# Patient Record
Sex: Female | Born: 1935 | Race: White | Hispanic: No | Marital: Single | Smoking: Former smoker
Health system: Southern US, Community
[De-identification: ages and names within clinical notes are randomized; demographics above are authoritative.]

## PROBLEM LIST (undated history)

## (undated) DIAGNOSIS — M1990 Unspecified osteoarthritis, unspecified site: Secondary | ICD-10-CM

## (undated) DIAGNOSIS — J449 Chronic obstructive pulmonary disease, unspecified: Secondary | ICD-10-CM

## (undated) DIAGNOSIS — N393 Stress incontinence (female) (male): Secondary | ICD-10-CM

## (undated) DIAGNOSIS — G629 Polyneuropathy, unspecified: Secondary | ICD-10-CM

## (undated) DIAGNOSIS — R51 Headache: Secondary | ICD-10-CM

## (undated) DIAGNOSIS — E119 Type 2 diabetes mellitus without complications: Secondary | ICD-10-CM

## (undated) DIAGNOSIS — M8580 Other specified disorders of bone density and structure, unspecified site: Secondary | ICD-10-CM

## (undated) DIAGNOSIS — E785 Hyperlipidemia, unspecified: Secondary | ICD-10-CM

## (undated) DIAGNOSIS — E669 Obesity, unspecified: Secondary | ICD-10-CM

## (undated) DIAGNOSIS — K219 Gastro-esophageal reflux disease without esophagitis: Secondary | ICD-10-CM

## (undated) DIAGNOSIS — R519 Headache, unspecified: Secondary | ICD-10-CM

## (undated) DIAGNOSIS — I10 Essential (primary) hypertension: Secondary | ICD-10-CM

## (undated) DIAGNOSIS — J45909 Unspecified asthma, uncomplicated: Secondary | ICD-10-CM

## (undated) DIAGNOSIS — H3530 Unspecified macular degeneration: Secondary | ICD-10-CM

## (undated) HISTORY — DX: Chronic obstructive pulmonary disease, unspecified: J44.9

## (undated) HISTORY — PX: BREAST LUMPECTOMY: SHX2

## (undated) HISTORY — DX: Type 2 diabetes mellitus without complications: E11.9

## (undated) HISTORY — DX: Hyperlipidemia, unspecified: E78.5

## (undated) HISTORY — DX: Other specified disorders of bone density and structure, unspecified site: M85.80

## (undated) HISTORY — DX: Unspecified osteoarthritis, unspecified site: M19.90

## (undated) HISTORY — DX: Essential (primary) hypertension: I10

## (undated) HISTORY — DX: Obesity, unspecified: E66.9

---

## 1999-01-16 ENCOUNTER — Other Ambulatory Visit: Admission: RE | Admit: 1999-01-16 | Discharge: 1999-01-16 | Payer: Self-pay | Admitting: Family Medicine

## 2000-04-01 ENCOUNTER — Other Ambulatory Visit: Admission: RE | Admit: 2000-04-01 | Discharge: 2000-04-01 | Payer: Self-pay | Admitting: Family Medicine

## 2001-03-24 ENCOUNTER — Other Ambulatory Visit: Admission: RE | Admit: 2001-03-24 | Discharge: 2001-03-24 | Payer: Self-pay | Admitting: Orthopaedic Surgery

## 2003-04-29 ENCOUNTER — Other Ambulatory Visit: Admission: RE | Admit: 2003-04-29 | Discharge: 2003-04-29 | Payer: Self-pay | Admitting: Family Medicine

## 2003-10-27 HISTORY — PX: COLONOSCOPY: SHX174

## 2008-02-26 HISTORY — PX: OTHER SURGICAL HISTORY: SHX169

## 2009-02-25 HISTORY — PX: COLONOSCOPY: SHX174

## 2011-04-02 DIAGNOSIS — I10 Essential (primary) hypertension: Secondary | ICD-10-CM | POA: Diagnosis not present

## 2011-04-02 DIAGNOSIS — IMO0001 Reserved for inherently not codable concepts without codable children: Secondary | ICD-10-CM | POA: Diagnosis not present

## 2011-04-02 DIAGNOSIS — E785 Hyperlipidemia, unspecified: Secondary | ICD-10-CM | POA: Diagnosis not present

## 2011-04-02 DIAGNOSIS — M545 Low back pain, unspecified: Secondary | ICD-10-CM | POA: Diagnosis not present

## 2011-04-02 DIAGNOSIS — I739 Peripheral vascular disease, unspecified: Secondary | ICD-10-CM | POA: Diagnosis not present

## 2011-04-17 DIAGNOSIS — E559 Vitamin D deficiency, unspecified: Secondary | ICD-10-CM | POA: Diagnosis not present

## 2011-04-17 DIAGNOSIS — I10 Essential (primary) hypertension: Secondary | ICD-10-CM | POA: Diagnosis not present

## 2011-05-21 DIAGNOSIS — D1801 Hemangioma of skin and subcutaneous tissue: Secondary | ICD-10-CM | POA: Diagnosis not present

## 2011-05-21 DIAGNOSIS — L821 Other seborrheic keratosis: Secondary | ICD-10-CM | POA: Diagnosis not present

## 2011-07-09 DIAGNOSIS — E1065 Type 1 diabetes mellitus with hyperglycemia: Secondary | ICD-10-CM | POA: Diagnosis not present

## 2011-07-09 DIAGNOSIS — I10 Essential (primary) hypertension: Secondary | ICD-10-CM | POA: Diagnosis not present

## 2011-07-09 DIAGNOSIS — R7989 Other specified abnormal findings of blood chemistry: Secondary | ICD-10-CM | POA: Diagnosis not present

## 2011-09-26 DIAGNOSIS — I10 Essential (primary) hypertension: Secondary | ICD-10-CM | POA: Diagnosis not present

## 2011-09-26 DIAGNOSIS — E1065 Type 1 diabetes mellitus with hyperglycemia: Secondary | ICD-10-CM | POA: Diagnosis not present

## 2011-10-21 DIAGNOSIS — I10 Essential (primary) hypertension: Secondary | ICD-10-CM | POA: Diagnosis not present

## 2011-10-21 DIAGNOSIS — E785 Hyperlipidemia, unspecified: Secondary | ICD-10-CM | POA: Diagnosis not present

## 2011-10-21 DIAGNOSIS — E119 Type 2 diabetes mellitus without complications: Secondary | ICD-10-CM | POA: Diagnosis not present

## 2011-11-13 DIAGNOSIS — Z23 Encounter for immunization: Secondary | ICD-10-CM | POA: Diagnosis not present

## 2011-11-21 DIAGNOSIS — Z1231 Encounter for screening mammogram for malignant neoplasm of breast: Secondary | ICD-10-CM | POA: Diagnosis not present

## 2011-12-23 DIAGNOSIS — J40 Bronchitis, not specified as acute or chronic: Secondary | ICD-10-CM | POA: Diagnosis not present

## 2011-12-23 DIAGNOSIS — J441 Chronic obstructive pulmonary disease with (acute) exacerbation: Secondary | ICD-10-CM | POA: Diagnosis not present

## 2012-02-11 DIAGNOSIS — E785 Hyperlipidemia, unspecified: Secondary | ICD-10-CM | POA: Diagnosis not present

## 2012-02-11 DIAGNOSIS — E559 Vitamin D deficiency, unspecified: Secondary | ICD-10-CM | POA: Diagnosis not present

## 2012-02-11 DIAGNOSIS — I10 Essential (primary) hypertension: Secondary | ICD-10-CM | POA: Diagnosis not present

## 2012-05-28 ENCOUNTER — Other Ambulatory Visit: Payer: Self-pay | Admitting: *Deleted

## 2012-05-28 MED ORDER — ATORVASTATIN CALCIUM 40 MG PO TABS: ORAL_TABLET | ORAL | Status: DC

## 2012-06-08 ENCOUNTER — Other Ambulatory Visit: Payer: Self-pay | Admitting: *Deleted

## 2012-06-08 MED ORDER — NAPROXEN 500 MG PO TABS: ORAL_TABLET | ORAL | Status: DC

## 2012-07-03 ENCOUNTER — Other Ambulatory Visit: Payer: Self-pay

## 2012-07-03 MED ORDER — LOSARTAN POTASSIUM-HCTZ 100-12.5 MG PO TABS: 1.0000 | ORAL_TABLET | Freq: Every day | ORAL | Status: DC

## 2012-07-10 ENCOUNTER — Other Ambulatory Visit: Payer: Self-pay | Admitting: *Deleted

## 2012-07-10 MED ORDER — IPRATROPIUM-ALBUTEROL 18-103 MCG/ACT IN AERO: 2.0000 | INHALATION_SPRAY | Freq: Four times a day (QID) | RESPIRATORY_TRACT | Status: DC | PRN

## 2012-07-30 DIAGNOSIS — J069 Acute upper respiratory infection, unspecified: Secondary | ICD-10-CM | POA: Diagnosis not present

## 2012-07-30 DIAGNOSIS — H6690 Otitis media, unspecified, unspecified ear: Secondary | ICD-10-CM | POA: Diagnosis not present

## 2012-07-30 DIAGNOSIS — H109 Unspecified conjunctivitis: Secondary | ICD-10-CM | POA: Diagnosis not present

## 2012-09-07 ENCOUNTER — Other Ambulatory Visit: Payer: Self-pay | Admitting: *Deleted

## 2012-09-07 MED ORDER — ATORVASTATIN CALCIUM 40 MG PO TABS: ORAL_TABLET | ORAL | Status: DC

## 2012-09-07 NOTE — Telephone Encounter (Signed)
This is okay to refill one time. Please have patient make an appointment to see a provider and have lab work checked.

## 2012-09-07 NOTE — Telephone Encounter (Signed)
LAST LABS 12/13. NTBS 

## 2012-10-27 ENCOUNTER — Other Ambulatory Visit: Payer: Self-pay

## 2012-10-27 NOTE — Telephone Encounter (Signed)
Last seen 02/11/12  DFS

## 2012-10-28 MED ORDER — SOLIFENACIN SUCCINATE 5 MG PO TABS: 10.0000 mg | ORAL_TABLET | Freq: Every day | ORAL | Status: DC

## 2012-11-08 ENCOUNTER — Other Ambulatory Visit: Payer: Self-pay | Admitting: General Practice

## 2012-11-08 DIAGNOSIS — N3281 Overactive bladder: Secondary | ICD-10-CM

## 2012-11-08 MED ORDER — SOLIFENACIN SUCCINATE 10 MG PO TABS: 10.0000 mg | ORAL_TABLET | Freq: Every day | ORAL | Status: DC

## 2012-11-25 DIAGNOSIS — Z23 Encounter for immunization: Secondary | ICD-10-CM | POA: Diagnosis not present

## 2012-11-27 ENCOUNTER — Ambulatory Visit (INDEPENDENT_AMBULATORY_CARE_PROVIDER_SITE_OTHER): Payer: Medicare Other | Admitting: Family Medicine

## 2012-11-27 ENCOUNTER — Telehealth: Payer: Self-pay | Admitting: Family Medicine

## 2012-11-27 ENCOUNTER — Ambulatory Visit (INDEPENDENT_AMBULATORY_CARE_PROVIDER_SITE_OTHER): Payer: Medicare Other

## 2012-11-27 ENCOUNTER — Encounter: Payer: Self-pay | Admitting: Family Medicine

## 2012-11-27 VITALS — BP 165/75 | HR 77 | Temp 97.9°F | Ht 61.5 in | Wt 209.6 lb

## 2012-11-27 DIAGNOSIS — E1142 Type 2 diabetes mellitus with diabetic polyneuropathy: Secondary | ICD-10-CM | POA: Diagnosis not present

## 2012-11-27 DIAGNOSIS — M8580 Other specified disorders of bone density and structure, unspecified site: Secondary | ICD-10-CM

## 2012-11-27 DIAGNOSIS — I10 Essential (primary) hypertension: Secondary | ICD-10-CM | POA: Insufficient documentation

## 2012-11-27 DIAGNOSIS — M25579 Pain in unspecified ankle and joints of unspecified foot: Secondary | ICD-10-CM | POA: Diagnosis not present

## 2012-11-27 DIAGNOSIS — E1149 Type 2 diabetes mellitus with other diabetic neurological complication: Secondary | ICD-10-CM

## 2012-11-27 DIAGNOSIS — M129 Arthropathy, unspecified: Secondary | ICD-10-CM

## 2012-11-27 DIAGNOSIS — E785 Hyperlipidemia, unspecified: Secondary | ICD-10-CM

## 2012-11-27 DIAGNOSIS — G629 Polyneuropathy, unspecified: Secondary | ICD-10-CM | POA: Insufficient documentation

## 2012-11-27 DIAGNOSIS — J449 Chronic obstructive pulmonary disease, unspecified: Secondary | ICD-10-CM

## 2012-11-27 DIAGNOSIS — M899 Disorder of bone, unspecified: Secondary | ICD-10-CM

## 2012-11-27 DIAGNOSIS — N318 Other neuromuscular dysfunction of bladder: Secondary | ICD-10-CM

## 2012-11-27 DIAGNOSIS — N3281 Overactive bladder: Secondary | ICD-10-CM

## 2012-11-27 DIAGNOSIS — E119 Type 2 diabetes mellitus without complications: Secondary | ICD-10-CM | POA: Insufficient documentation

## 2012-11-27 DIAGNOSIS — E782 Mixed hyperlipidemia: Secondary | ICD-10-CM | POA: Insufficient documentation

## 2012-11-27 DIAGNOSIS — E669 Obesity, unspecified: Secondary | ICD-10-CM | POA: Insufficient documentation

## 2012-11-27 DIAGNOSIS — G609 Hereditary and idiopathic neuropathy, unspecified: Secondary | ICD-10-CM

## 2012-11-27 DIAGNOSIS — M1990 Unspecified osteoarthritis, unspecified site: Secondary | ICD-10-CM | POA: Insufficient documentation

## 2012-11-27 LAB — POCT UA - MICROALBUMIN: Microalbumin Ur, POC: 20 mg/L

## 2012-11-27 LAB — POCT GLYCOSYLATED HEMOGLOBIN (HGB A1C): Hemoglobin A1C: 7.2

## 2012-11-27 MED ORDER — METFORMIN HCL 1000 MG PO TABS: 1000.0000 mg | ORAL_TABLET | Freq: Two times a day (BID) | ORAL | Status: DC

## 2012-11-27 MED ORDER — SOLIFENACIN SUCCINATE 10 MG PO TABS: 10.0000 mg | ORAL_TABLET | Freq: Every day | ORAL | Status: DC

## 2012-11-27 MED ORDER — IPRATROPIUM-ALBUTEROL 18-103 MCG/ACT IN AERO: 2.0000 | INHALATION_SPRAY | Freq: Four times a day (QID) | RESPIRATORY_TRACT | Status: DC | PRN

## 2012-11-27 MED ORDER — SITAGLIPTIN PHOSPHATE 100 MG PO TABS: 100.0000 mg | ORAL_TABLET | Freq: Every day | ORAL | Status: DC

## 2012-11-27 MED ORDER — GABAPENTIN 100 MG PO CAPS: 300.0000 mg | ORAL_CAPSULE | Freq: Three times a day (TID) | ORAL | Status: DC

## 2012-11-27 MED ORDER — LOSARTAN POTASSIUM-HCTZ 100-12.5 MG PO TABS: 1.0000 | ORAL_TABLET | Freq: Every day | ORAL | Status: DC

## 2012-11-27 MED ORDER — ATORVASTATIN CALCIUM 40 MG PO TABS: ORAL_TABLET | ORAL | Status: DC

## 2012-11-27 NOTE — Progress Notes (Signed)
Patient ID: Michaela Moon, female   DOB: March 26, 1935, 77 y.o.   MRN: [id number] SUBJECTIVE: CC: Chief Complaint  Patient presents with  . Follow-up    follow up  ck heel c/o heel spurs   . Medication Refill    needs refills and wants rx for hydrocodone -been using her son's    HPI:  Used to see Paulita Cradle.  Patient is here for follow up of Diabetes Mellitus and foot pain: Symptoms evaluated: Denies Nocturia ,Denies Urinary Frequency , denies Blurred vision ,deniesDizziness,denies.Dysuria,denies paresthesias, denies extremity pain or ulcers.Marland Kitchendenies chest pain. has had an annual eye exam. do check the feet. Does check CBGs. Average CBG:175 Denies episodes of hypoglycemia. Does have an emergency hypoglycemic plan. admits toCompliance with medications. Denies Problems with medications.  Breakfast: eggs , bacon Lunch: snack on crackers  Supper: chicken, meat, mac and cheese.  Past Medical History  Diagnosis Date  . Hypertension   . Hyperlipidemia   . Diabetes mellitus without complication   . Arthritis   . Obesity   . Osteopenia    No past surgical history on file. History   Social History  . Marital Status: Single    Spouse Name: N/A    Number of Children: N/A  . Years of Education: N/A   Occupational History  . Not on file.   Social History Main Topics  . Smoking status: Former Smoker    Types: Cigarettes    Quit date: 02/25/2002  . Smokeless tobacco: Not on file  . Alcohol Use: Not on file  . Drug Use: Not on file  . Sexual Activity: Not on file   Other Topics Concern  . Not on file   Social History Narrative  . No narrative on file   No family history on file. Current Outpatient Prescriptions on File Prior to Visit  Medication Sig Dispense Refill  . albuterol-ipratropium (COMBIVENT) 18-103 MCG/ACT inhaler Inhale 2 puffs into the lungs every 6 (six) hours as needed for wheezing.  14.7 g  0  . atorvastatin (LIPITOR) 40 MG tablet Take 1 &1/2 tabs  qd  135 tablet  0  . losartan-hydrochlorothiazide (HYZAAR) 100-12.5 MG per tablet Take 1 tablet by mouth daily.  30 tablet  0  . naproxen (NAPROSYN) 500 MG tablet Take 1 tab po bid prn pain, take with meal.Must be seen before next refill  60 tablet  0  . solifenacin (VESICARE) 10 MG tablet Take 1 tablet (10 mg total) by mouth daily.  30 tablet  1   No current facility-administered medications on file prior to visit.   No Known Allergies Immunization History  Administered Date(s) Administered  . Influenza-Generic 10/26/2012  . Pneumococcal Conjugate 02/26/2004  . Zoster 03/29/2007   Prior to Admission medications   Medication Sig Start Date End Date Taking? Authorizing Provider  albuterol-ipratropium (COMBIVENT) 18-103 MCG/ACT inhaler Inhale 2 puffs into the lungs every 6 (six) hours as needed for wheezing. 07/10/12  Yes Ernestina Penna, MD  atorvastatin (LIPITOR) 40 MG tablet Take 1 &1/2 tabs qd 09/07/12  Yes Ernestina Penna, MD  losartan-hydrochlorothiazide [facility]) 100-12.5 MG per tablet Take 1 tablet by mouth daily. 07/03/12  Yes Ernestina Penna, MD  metFORMIN (GLUCOPHAGE) 1000 MG tablet Take 1,000 mg by mouth 2 (two) times daily with a meal.   Yes Historical Provider, MD  naproxen (NAPROSYN) 500 MG tablet Take 1 tab po bid prn pain, take with meal.Must be seen before next refill 06/08/12  Yes Ernestina Penna,  MD  sitaGLIPtin (JANUVIA) 100 MG tablet Take 100 mg by mouth daily.   Yes Historical Provider, MD  solifenacin (VESICARE) 10 MG tablet Take 1 tablet (10 mg total) by mouth daily. 11/08/12  Yes Mae Shelda Altes, FNP      ROS: As above in the HPI. All other systems are stable or negative.  OBJECTIVE: APPEARANCE:  Patient in no acute distress.The patient appeared well nourished and normally developed. Acyanotic. Waist: VITAL SIGNS:BP 165/75  Pulse 77  Temp(Src) 97.9 F (36.6 C) (Oral)  Ht 5' 1.5" (1.562 m)  Wt 209 lb 9.6 oz (95.074 kg)  BMI 38.97 kg/m2 WF Obese  elderly Accompanied by son with the expectation and demand for opiods for pain  SKIN: warm and  Dry without overt rashes, tattoos and scars  HEAD and Neck: without JVD, Head and scalp: normal Eyes:No scleral icterus. Fundi normal, eye movements normal. Ears: Auricle normal, canal normal, Tympanic membranes normal, insufflation normal. Nose: normal Throat: normal Neck & thyroid: normal  CHEST & LUNGS: Chest wall: normal Lungs: Clear  CVS: Reveals the PMI to be normally located. Regular rhythm, First and Second Heart sounds are normal,  absence of murmurs, rubs or gallops. Peripheral vasculature: Radial pulses: normal Dorsal pedis pulses: normal Posterior pulses: normal  ABDOMEN:  Appearance: obese Benign, no organomegaly, no masses, no Abdominal Aortic enlargement. No Guarding , no rebound. No Bruits. Bowel sounds: normal  RECTAL: N/A GU: N/A  EXTREMETIES: nonedematous.  MUSCULOSKELETAL:  Spine: normal Joints: knees mild  Crepitus and discomfort to ROM.  NEUROLOGIC: oriented to time,place and person; nonfocal. Strength is normal Sensory is normal to monofilament Reflexes are normal Cranial Nerves are normal.  C/o of burning pain of the feet.  ASSESSMENT:  PLAN: Orders Placed This Encounter  Procedures  . DG Foot Complete Left    Standing Status: Future     Number of Occurrences: 1     Standing Expiration Date: 01/27/2014    Order Specific Question:  Reason for Exam (SYMPTOM  OR DIAGNOSIS REQUIRED)    Answer:  left foot pain, DM    Order Specific Question:  Preferred imaging location?    Answer:  Internal  . CMP14+EGFR  . NMR, lipoprofile  . Arthritis Panel  . Microalbumin, urine  . POCT glycosylated hemoglobin (Hb A1C)  . POCT UA - Microalbumin    [facility] reading (PRIMARY) by  Dr. Modesto Moon: mild osteopenia of the bones of the feet, no acute findings. Small heel spurs  Discussed with patient that I suspect that her problem is all related to DM neuropathy  especially with her noncompliance and poor control.  Medications Discontinued During This Encounter  Medication Reason  . losartan-hydrochlorothiazide (HYZAAR) 100-12.5 MG per tablet Reorder  . metFORMIN (GLUCOPHAGE) 1000 MG tablet Reorder  . sitaGLIPtin (JANUVIA) 100 MG tablet Reorder  . atorvastatin (LIPITOR) 40 MG tablet Reorder  . solifenacin (VESICARE) 10 MG tablet Reorder  . albuterol-ipratropium (COMBIVENT) 18-103 MCG/ACT inhaler Reorder   Meds ordered this encounter  Medications  . DISCONTD: sitaGLIPtin (JANUVIA) 100 MG tablet    Sig: Take 100 mg by mouth daily.  Marland Kitchen DISCONTD: metFORMIN (GLUCOPHAGE) 1000 MG tablet    Sig: Take 1,000 mg by mouth 2 (two) times daily with a meal.  . losartan-hydrochlorothiazide (HYZAAR) 100-12.5 MG per tablet    Sig: Take 1 tablet by mouth daily.    Dispense:  30 tablet    Refill:  3  . metFORMIN (GLUCOPHAGE) 1000 MG tablet    Sig: Take  1 tablet (1,000 mg total) by mouth 2 (two) times daily with a meal.    Dispense:  60 tablet    Refill:  3  . sitaGLIPtin (JANUVIA) 100 MG tablet    Sig: Take 1 tablet (100 mg total) by mouth daily.    Dispense:  30 tablet    Refill:  3  . atorvastatin (LIPITOR) 40 MG tablet    Sig: Take 1 &1/2 tabs qd    Dispense:  135 tablet    Refill:  3    NTBS  . solifenacin (VESICARE) 10 MG tablet    Sig: Take 1 tablet (10 mg total) by mouth daily.    Dispense:  30 tablet    Refill:  2    Order Specific Question:  Supervising Provider    Answer:  Ernestina Penna [[id number]]  . albuterol-ipratropium (COMBIVENT) 18-103 MCG/ACT inhaler    Sig: Inhale 2 puffs into the lungs every 6 (six) hours as needed for wheezing.    Dispense:  14.7 g    Refill:  2    Must be seen before next refill  . gabapentin (NEURONTIN) 100 MG capsule    Sig: Take 3 capsules (300 mg total) by mouth 3 (three) times daily. Start with 1 capsule daily for 2 days then twice  A day for 2 days then 3 times a day after that    Dispense:  90 capsule     Refill:  3   Informed need for compliance , diet and keeping active.  Return in about 2 months (around 01/27/2013) for Recheck medical problems.  Michaela Moon, M.D.

## 2012-11-27 NOTE — Telephone Encounter (Signed)
Dr. Modesto Charon aware. Patient discussed this with him at office visit today. Patient is aware that pain clinic appointments take a couple of weeks to schedule.  She is ok with this. Notified that prescription for neurontin is available for pickup at the front desk.

## 2012-11-28 LAB — MICROALBUMIN, URINE: Microalbumin, Urine: 6.8 ug/mL (ref 0.0–17.0)

## 2012-11-30 ENCOUNTER — Other Ambulatory Visit: Payer: Self-pay | Admitting: Family Medicine

## 2012-11-30 LAB — NMR, LIPOPROFILE
Cholesterol: 160 mg/dL (ref <200)
HDL Cholesterol by NMR: 36 mg/dL — ABNORMAL LOW (ref >=40)
HDL Particle Number: 31.4 umol/L (ref >=30.5)
LDL Particle Number: 1704 nmol/L — ABNORMAL HIGH (ref <1000)
LDL Size: 19.9 nm — ABNORMAL LOW (ref >20.5)
LDLC SERPL CALC-MCNC: 80 mg/dL (ref <100)
LP-IR Score: 70 — ABNORMAL HIGH (ref <=45)
Small LDL Particle Number: 1236 nmol/L — ABNORMAL HIGH (ref <=527)
Triglycerides by NMR: 218 mg/dL — ABNORMAL HIGH (ref <150)

## 2012-11-30 LAB — CMP14+EGFR
ALT: 40 [IU]/L — ABNORMAL HIGH (ref 0–32)
AST: 32 [IU]/L (ref 0–40)
Albumin/Globulin Ratio: 1.5 (ref 1.1–2.5)
Albumin: 4.9 g/dL — ABNORMAL HIGH (ref 3.5–4.8)
Alkaline Phosphatase: 56 [IU]/L (ref 39–117)
BUN/Creatinine Ratio: 17 (ref 11–26)
BUN: 13 mg/dL (ref 8–27)
CO2: 26 mmol/L (ref 18–29)
Calcium: 11.3 mg/dL — ABNORMAL HIGH (ref 8.6–10.2)
Chloride: 98 mmol/L (ref 97–108)
Creatinine, Ser: 0.75 mg/dL (ref 0.57–1.00)
GFR calc Af Amer: 90 mL/min/{1.73_m2}
GFR calc non Af Amer: 78 mL/min/{1.73_m2}
Globulin, Total: 3.3 g/dL (ref 1.5–4.5)
Glucose: 142 mg/dL — ABNORMAL HIGH (ref 65–99)
Potassium: 4 mmol/L (ref 3.5–5.2)
Sodium: 139 mmol/L (ref 134–144)
Total Bilirubin: 0.4 mg/dL (ref 0.0–1.2)
Total Protein: 8.2 g/dL (ref 6.0–8.5)

## 2012-11-30 LAB — ARTHRITIS PANEL
Anti Nuclear Antibody(ANA): NEGATIVE
Rheumatoid fact SerPl-aCnc: <7 [IU]/mL (ref 0.0–13.9)
Sed Rate: 12 mm/h (ref 0–40)
Uric Acid: 5.3 mg/dL (ref 2.5–7.1)

## 2012-11-30 NOTE — Progress Notes (Signed)
Quick Note:  Labs abnormal. Needs additional labs. The calcium is too high.ordered in EPIC The DM and the lipids are not at goal. Needs to see Tammy or Marcelino Duster for diet review and medication adjustment. ______

## 2012-12-02 ENCOUNTER — Other Ambulatory Visit (INDEPENDENT_AMBULATORY_CARE_PROVIDER_SITE_OTHER): Payer: Medicare Other

## 2012-12-08 LAB — PTH, INTACT AND CALCIUM
Calcium: 10.8 mg/dL — ABNORMAL HIGH (ref 8.6–10.2)
PTH: 28 pg/mL (ref 15–65)

## 2012-12-08 LAB — CALCIUM, IONIZED: Calcium, Ion: 5.5 mg/dL (ref 4.5–5.6)

## 2012-12-09 NOTE — Progress Notes (Signed)
Quick Note:  Call patient. Labs for PTH and ionized calcium are normal. The total calcium is a little high and this could be either due to the tourniquet being on her arm too long during the lab draw or if she is taking calcium she will need to stop and we will recheck at the next vivit. No change in plan. ______

## 2012-12-23 DIAGNOSIS — Z1231 Encounter for screening mammogram for malignant neoplasm of breast: Secondary | ICD-10-CM | POA: Diagnosis not present

## 2012-12-24 ENCOUNTER — Encounter: Payer: Self-pay | Admitting: Pharmacist

## 2012-12-24 ENCOUNTER — Ambulatory Visit (INDEPENDENT_AMBULATORY_CARE_PROVIDER_SITE_OTHER): Payer: Medicare Other | Admitting: Pharmacist

## 2012-12-24 VITALS — BP 144/72 | HR 76 | Ht 61.5 in | Wt 208.0 lb

## 2012-12-24 MED ORDER — ATORVASTATIN CALCIUM 80 MG PO TABS: ORAL_TABLET | ORAL | Status: DC

## 2012-12-24 NOTE — Addendum Note (Signed)
Addended by: Henrene Pastor on: 12/24/2012 04:49 PM   Modules accepted: Orders, Medications

## 2012-12-24 NOTE — Progress Notes (Signed)
CC:  Hypercalcemia and medication review.   HPI:  Patient had elevated calcium of 11.3 on 11/27/2012.  Was rechecked on 12/02/2012 and calcium was 10.8.  Ionized calcium and PTH were normal on 12/02/2012. Current meds in chart are listed below but she also states that she takes a MVI and ASA 81mg  daily.  Medication record was updated.  She also was taking a daily vitamin D supplement but she stopped when discovered calcium was elevated.    Patient also brings in letter from her insurance company which states that starting February 25, 2013 there will be a limit of 90 tablets per 90 days of atorvastatin.  She is currently taking atorvastatin 40mg  1 and 1/2 tablets daily.   Current Outpatient Prescriptions on File Prior to Visit  Medication Sig Dispense Refill  . albuterol-ipratropium (COMBIVENT) 18-103 MCG/ACT inhaler Inhale 2 puffs into the lungs every 6 (six) hours as needed for wheezing.  14.7 g  2  . gabapentin (NEURONTIN) 100 MG capsule Take 3 capsules (300 mg total) by mouth 3 (three) times daily. Start with 1 capsule daily for 2 days then twice  A day for 2 days then 3 times a day after that  90 capsule  3  . losartan-hydrochlorothiazide (HYZAAR) 100-12.5 MG per tablet Take 1 tablet by mouth daily.  30 tablet  3  . metFORMIN (GLUCOPHAGE) 1000 MG tablet Take 1 tablet (1,000 mg total) by mouth 2 (two) times daily with a meal.  60 tablet  3  . naproxen (NAPROSYN) 500 MG tablet Take 1 tab po bid prn pain, take with meal.Must be seen before next refill  60 tablet  0  . sitaGLIPtin (JANUVIA) 100 MG tablet Take 1 tablet (100 mg total) by mouth daily.  30 tablet  3  . solifenacin (VESICARE) 10 MG tablet Take 1 tablet (10 mg total) by mouth daily.  30 tablet  2   No current facility-administered medications on file prior to visit.    Assessment: Hypercalcemia - possible medication related Medication Management  Plan: Discontinue multivitamin Continue to hold vitamin D Rx for atorvastatin  changed to 80mg  - take 1 tablet every other day alternating with 1/2 tablet every other day.  (patient will still get average of 60mg  daily) Recheck BMET today -  Pending  Time spent with patient = 15 minutes Henrene Pastor, PharmD, CPP

## 2012-12-25 LAB — BMP8+EGFR
BUN: 11 mg/dL (ref 8–27)
CO2: 25 mmol/L (ref 18–29)
Calcium: 10.6 mg/dL — ABNORMAL HIGH (ref 8.6–10.2)
Chloride: 99 mmol/L (ref 97–108)
Creatinine, Ser: 0.72 mg/dL (ref 0.57–1.00)
Potassium: 4.6 mmol/L (ref 3.5–5.2)
Sodium: 140 mmol/L (ref 134–144)

## 2012-12-28 ENCOUNTER — Telehealth: Payer: Self-pay | Admitting: Pharmacist

## 2012-12-28 NOTE — Telephone Encounter (Signed)
Calcium level continues to decrease.  At our visit last week I discontinued patient's multivitamin.  Should continue to see calcium decrease since this change.  Recheck BMET / Calcium in 1 month.

## 2013-01-19 DIAGNOSIS — J111 Influenza due to unidentified influenza virus with other respiratory manifestations: Secondary | ICD-10-CM | POA: Diagnosis not present

## 2013-01-19 DIAGNOSIS — R05 Cough: Secondary | ICD-10-CM | POA: Diagnosis not present

## 2013-01-19 DIAGNOSIS — J189 Pneumonia, unspecified organism: Secondary | ICD-10-CM | POA: Diagnosis not present

## 2013-01-19 DIAGNOSIS — J449 Chronic obstructive pulmonary disease, unspecified: Secondary | ICD-10-CM | POA: Diagnosis not present

## 2013-01-29 ENCOUNTER — Encounter: Payer: Self-pay | Admitting: Family Medicine

## 2013-01-29 ENCOUNTER — Ambulatory Visit (INDEPENDENT_AMBULATORY_CARE_PROVIDER_SITE_OTHER): Payer: Medicare Other | Admitting: Family Medicine

## 2013-01-29 ENCOUNTER — Ambulatory Visit (INDEPENDENT_AMBULATORY_CARE_PROVIDER_SITE_OTHER): Payer: Medicare Other

## 2013-01-29 VITALS — BP 142/66 | HR 106 | Temp 97.9°F | Ht 62.0 in | Wt 205.2 lb

## 2013-01-29 DIAGNOSIS — M1990 Unspecified osteoarthritis, unspecified site: Secondary | ICD-10-CM

## 2013-01-29 DIAGNOSIS — E669 Obesity, unspecified: Secondary | ICD-10-CM

## 2013-01-29 DIAGNOSIS — R635 Abnormal weight gain: Secondary | ICD-10-CM

## 2013-01-29 DIAGNOSIS — G629 Polyneuropathy, unspecified: Secondary | ICD-10-CM

## 2013-01-29 DIAGNOSIS — M899 Disorder of bone, unspecified: Secondary | ICD-10-CM

## 2013-01-29 DIAGNOSIS — G609 Hereditary and idiopathic neuropathy, unspecified: Secondary | ICD-10-CM | POA: Diagnosis not present

## 2013-01-29 DIAGNOSIS — J189 Pneumonia, unspecified organism: Secondary | ICD-10-CM

## 2013-01-29 DIAGNOSIS — I10 Essential (primary) hypertension: Secondary | ICD-10-CM

## 2013-01-29 DIAGNOSIS — M129 Arthropathy, unspecified: Secondary | ICD-10-CM

## 2013-01-29 DIAGNOSIS — E785 Hyperlipidemia, unspecified: Secondary | ICD-10-CM

## 2013-01-29 DIAGNOSIS — E119 Type 2 diabetes mellitus without complications: Secondary | ICD-10-CM

## 2013-01-29 DIAGNOSIS — M8580 Other specified disorders of bone density and structure, unspecified site: Secondary | ICD-10-CM

## 2013-01-29 NOTE — Patient Instructions (Signed)
    Dr Gared Gillie's Recommendations  For nutrition information, I recommend books:  1).Eat to Live by Dr Joel Fuhrman. 2).Prevent and Reverse Heart Disease by Dr Caldwell Esselstyn. 3) Dr Neal Barnard's Book:  Program to Reverse Diabetes  Exercise recommendations are:  If unable to walk, then the patient can exercise in a chair 3 times a day. By flapping arms like a bird gently and raising legs outwards to the front.  If ambulatory, the patient can go for walks for 30 minutes 3 times a week. Then increase the intensity and duration as tolerated.  Goal is to try to attain exercise frequency to 5 times a week.  If applicable: Best to perform resistance exercises (machines or weights) 2 days a week and cardio type exercises 3 days per week.   Diabetes and Foot Care Diabetes may cause you to have problems because of poor blood supply (circulation) to your feet and legs. This may cause the skin on your feet to become thinner, break easier, and heal more slowly. Your skin may become dry, and the skin may peel and crack. You may also have nerve damage in your legs and feet causing decreased feeling in them. You may not notice minor injuries to your feet that could lead to infections or more serious problems. Taking care of your feet is one of the most important things you can do for yourself.  HOME CARE INSTRUCTIONS  Wear shoes at all times, even in the house. Do not go barefoot. Bare feet are easily injured.  Check your feet daily for blisters, cuts, and redness. If you cannot see the bottom of your feet, use a mirror or ask someone for help.  Wash your feet with warm water (do not use hot water) and mild soap. Then pat your feet and the areas between your toes until they are completely dry. Do not soak your feet as this can dry your skin.  Apply a moisturizing lotion or petroleum jelly (that does not contain alcohol and is unscented) to the skin on your feet and to dry, brittle toenails.  Do not apply lotion between your toes.  Trim your toenails straight across. Do not dig under them or around the cuticle. File the edges of your nails with an emery board or nail file.  Do not cut corns or calluses or try to remove them with medicine.  Wear clean socks or stockings every day. Make sure they are not too tight. Do not wear knee-high stockings since they may decrease blood flow to your legs.  Wear shoes that fit properly and have enough cushioning. To break in new shoes, wear them for just a few hours a day. This prevents you from injuring your feet. Always look in your shoes before you put them on to be sure there are no objects inside.  Do not cross your legs. This may decrease the blood flow to your feet.  If you find a minor scrape, cut, or break in the skin on your feet, keep it and the skin around it clean and dry. These areas may be cleansed with mild soap and water. Do not cleanse the area with peroxide, alcohol, or iodine.  When you remove an adhesive bandage, be sure not to damage the skin around it.  If you have a wound, look at it several times a day to make sure it is healing.  Do not use heating pads or hot water bottles. They may burn your skin. If   you have lost feeling in your feet or legs, you may not know it is happening until it is too late.  Make sure your health care provider performs a complete foot exam at least annually or more often if you have foot problems. Report any cuts, sores, or bruises to your health care provider immediately. SEEK MEDICAL CARE IF:   You have an injury that is not healing.  You have cuts or breaks in the skin.  You have an ingrown nail.  You notice redness on your legs or feet.  You feel burning or tingling in your legs or feet.  You have pain or cramps in your legs and feet.  Your legs or feet are numb.  Your feet always feel cold. SEEK IMMEDIATE MEDICAL CARE IF:   There is increasing redness, swelling, or pain in  or around a wound.  There is a red line that goes up your leg.  Pus is coming from a wound.  You develop a fever or as directed by your health care provider.  You notice a bad smell coming from an ulcer or wound. Document Released: 02/09/2000 Document Revised: 10/14/2012 Document Reviewed: 07/21/2012 [employer] Patient Information 2014 [employer].  

## 2013-01-29 NOTE — Progress Notes (Signed)
Patient ID: Michaela Moon, female   DOB: 12-28-1935, 77 y.o.   MRN: [id number] SUBJECTIVE: CC: Chief Complaint  Patient presents with  . Follow-up    1 month follow up states had pneumoinia on 01-19-13 treated at the urgent care    HPI: Recent pneumonia before thanksgiving. Feels pretty good now.  Patient is here for follow up of Diabetes Mellitus/obesity/HTN/HLD Symptoms evaluated: Denies Nocturia ,Denies Urinary Frequency , denies Blurred vision ,deniesDizziness,denies.Dysuria,denies paresthesias, denies extremity  ulcers.Marland Kitchendenies chest pain. has had an annual eye exam. do check the feet.feet burns but the gabapentin has relieved this Does check CBGs. Average CBG:130s Denies episodes of hypoglycemia. Does have an emergency hypoglycemic plan. admits toCompliance with medications. Denies Problems with medications.   Past Medical History  Diagnosis Date  . Hypertension   . Hyperlipidemia   . Diabetes mellitus without complication   . Arthritis   . Obesity   . Osteopenia    No past surgical history on file. History   Social History  . Marital Status: Single    Spouse Name: N/A    Number of Children: N/A  . Years of Education: N/A   Occupational History  . Not on file.   Social History Main Topics  . Smoking status: Former Smoker    Types: Cigarettes    Quit date: 02/25/2002  . Smokeless tobacco: Not on file  . Alcohol Use: Not on file  . Drug Use: Not on file  . Sexual Activity: Not on file   Other Topics Concern  . Not on file   Social History Narrative  . No narrative on file   No family history on file. Current Outpatient Prescriptions on File Prior to Visit  Medication Sig Dispense Refill  . albuterol-ipratropium (COMBIVENT) 18-103 MCG/ACT inhaler Inhale 2 puffs into the lungs every 6 (six) hours as needed for wheezing.  14.7 g  2  . atorvastatin (LIPITOR) 80 MG tablet Alternate taking 1 tablet every other day and 1/2 tablet every other day (to average  60mg  daily)  68 tablet  1  . gabapentin (NEURONTIN) 100 MG capsule Take 3 capsules (300 mg total) by mouth 3 (three) times daily. Start with 1 capsule daily for 2 days then twice  A day for 2 days then 3 times a day after that  90 capsule  3  . losartan-hydrochlorothiazide (HYZAAR) 100-12.5 MG per tablet Take 1 tablet by mouth daily.  30 tablet  3  . metFORMIN (GLUCOPHAGE) 1000 MG tablet Take 1 tablet (1,000 mg total) by mouth 2 (two) times daily with a meal.  60 tablet  3  . naproxen (NAPROSYN) 500 MG tablet Take 1 tab po bid prn pain, take with meal.Must be seen before next refill  60 tablet  0  . sitaGLIPtin (JANUVIA) 100 MG tablet Take 1 tablet (100 mg total) by mouth daily.  30 tablet  3  . solifenacin (VESICARE) 10 MG tablet Take 1 tablet (10 mg total) by mouth daily.  30 tablet  2  . aspirin 81 MG tablet Take 81 mg by mouth daily.       No current facility-administered medications on file prior to visit.   No Known Allergies Immunization History  Administered Date(s) Administered  . Influenza-Unspecified 10/26/2012  . Pneumococcal Conjugate-13 02/26/2004  . Zoster 03/29/2007   Prior to Admission medications   Medication Sig Start Date End Date Taking? Authorizing Provider  albuterol-ipratropium (COMBIVENT) 18-103 MCG/ACT inhaler Inhale 2 puffs into the lungs every 6 (six) hours  as needed for wheezing. 11/27/12   Ileana Ladd, MD  aspirin 81 MG tablet Take 81 mg by mouth daily.    Historical Provider, MD  atorvastatin (LIPITOR) 80 MG tablet Alternate taking 1 tablet every other day and 1/2 tablet every other day (to average 60mg  daily) 12/24/12   Tammy Eckard, PHARMD  gabapentin (NEURONTIN) 100 MG capsule Take 3 capsules (300 mg total) by mouth 3 (three) times daily. Start with 1 capsule daily for 2 days then twice  A day for 2 days then 3 times a day after that 11/27/12   Ileana Ladd, MD  losartan-hydrochlorothiazide (HYZAAR) 100-12.5 MG per tablet Take 1 tablet by mouth daily.  11/27/12   Ileana Ladd, MD  metFORMIN (GLUCOPHAGE) 1000 MG tablet Take 1 tablet (1,000 mg total) by mouth 2 (two) times daily with a meal. 11/27/12   Ileana Ladd, MD  naproxen (NAPROSYN) 500 MG tablet Take 1 tab po bid prn pain, take with meal.Must be seen before next refill 06/08/12   Ernestina Penna, MD  sitaGLIPtin (JANUVIA) 100 MG tablet Take 1 tablet (100 mg total) by mouth daily. 11/27/12   Ileana Ladd, MD  solifenacin (VESICARE) 10 MG tablet Take 1 tablet (10 mg total) by mouth daily. 11/27/12   Ileana Ladd, MD     ROS: As above in the HPI. All other systems are stable or negative.  OBJECTIVE: APPEARANCE:  Patient in no acute distress.The patient appeared well nourished and normally developed. Acyanotic. Waist: VITAL SIGNS:BP 142/66  Pulse 106  Temp(Src) 97.9 F (36.6 C) (Oral)  Ht 5\' 2"  (1.575 m)  Wt 205 lb 3.2 oz (93.078 kg)  BMI 37.52 kg/m2 Obese WF congested  SKIN: warm and  Dry without overt rashes, tattoos and scars  HEAD and Neck: without JVD, Head and scalp: normal Eyes:No scleral icterus. Fundi normal, eye movements normal. Ears: Auricle normal, canal normal, Tympanic membranes normal, insufflation normal. Nose: normal Throat: normal Neck & thyroid: normal  CHEST & LUNGS: Chest wall: normal Lungs: Coarse bs. No rales  CVS: Reveals the PMI to be normally located. Regular rhythm, First and Second Heart sounds are normal,  absence of murmurs, rubs or gallops. Peripheral vasculature: Radial pulses: normal Dorsal pedis pulses: normal Posterior pulses: normal  ABDOMEN:  Appearance: Obese Benign, no organomegaly, no masses, no Abdominal Aortic enlargement. No Guarding , no rebound. No Bruits. Bowel sounds: normal  RECTAL: N/A GU: N/A  EXTREMETIES: nonedematous.  MUSCULOSKELETAL:  Spine: normal Joints: crepitus of knees.  NEUROLOGIC: oriented to time,place and person; nonfocal. Strength is normal Sensorymild subjective burning of the  knees. improved from the last visit. Reflexes are normal Cranial Nerves are normal.   Results for orders placed in visit on 12/24/12  [facility]      Result Value Range   Glucose 124 (*) 65 - 99 mg/dL   BUN 11  8 - 27 mg/dL   Creatinine, Ser 8.29  0.57 - 1.00 mg/dL   GFR calc non Af Amer 82  >59 mL/min/1.73   GFR calc Af Amer 95  >59 mL/min/1.73   BUN/Creatinine Ratio 15  11 - 26   Sodium 140  134 - 144 mmol/L   Potassium 4.6  3.5 - 5.2 mmol/L   Chloride 99  97 - 108 mmol/L   CO2 25  18 - 29 mmol/L   Calcium 10.6 (*) 8.6 - 10.2 mg/dL    ASSESSMENT:  Pneumonia - Plan: DG Chest 2 View  Peripheral  neuropathy  Osteopenia  Obesity  Hypertension  Diabetes mellitus without complication  Hyperlipidemia  Arthritis  Recent pneumonia treated at the Urgent [facility]  PLAN:      Dr Woodroe Mode Recommendations  For nutrition information, I recommend books:  1).Eat to Live by Dr Monico Hoar. 2).Prevent and Reverse Heart Disease by Dr Suzzette Righter. 3) Dr Katherina Right Book:  Program to Reverse Diabetes  Exercise recommendations are:  If unable to walk, then the patient can exercise in a chair 3 times a day. By flapping arms like a bird gently and raising legs outwards to the front.  If ambulatory, the patient can go for walks for 30 minutes 3 times a week. Then increase the intensity and duration as tolerated.  Goal is to try to attain exercise frequency to 5 times a week.  If applicable: Best to perform resistance exercises (machines or weights) 2 days a week and cardio type exercises 3 days per week.  DM foot care in the AVS.  Orders Placed This Encounter  Procedures  . DG Chest 2 View    Standing Status: Future     Number of Occurrences: 1     Standing Expiration Date: 04/01/2014    Order Specific Question:  Reason for Exam (SYMPTOM  OR DIAGNOSIS REQUIRED)    Answer:  recent pneumonia    Order Specific Question:  Preferred imaging  location?    Answer:  Internal   same  medications No orders of the defined types were placed in this encounter.   There are no discontinued medications. Return in about 2 months (around 04/01/2013) for Recheck medical problems. [facility] reading (PRIMARY) by  Dr. Modesto Charon: bibasilar atelectasis and scarring. Await official overead.                          Tharon Kitch P. Modesto Charon, M.D.

## 2013-02-23 ENCOUNTER — Other Ambulatory Visit: Payer: Self-pay | Admitting: *Deleted

## 2013-02-23 DIAGNOSIS — N3281 Overactive bladder: Secondary | ICD-10-CM

## 2013-02-23 MED ORDER — SOLIFENACIN SUCCINATE 10 MG PO TABS: 10.0000 mg | ORAL_TABLET | Freq: Every day | ORAL | Status: DC

## 2013-02-23 MED ORDER — NAPROXEN 500 MG PO TABS: ORAL_TABLET | ORAL | Status: DC

## 2013-04-01 ENCOUNTER — Encounter: Payer: Self-pay | Admitting: Family Medicine

## 2013-04-01 ENCOUNTER — Ambulatory Visit (INDEPENDENT_AMBULATORY_CARE_PROVIDER_SITE_OTHER): Payer: Medicare Other | Admitting: Family Medicine

## 2013-04-01 ENCOUNTER — Encounter (INDEPENDENT_AMBULATORY_CARE_PROVIDER_SITE_OTHER): Payer: Self-pay

## 2013-04-01 VITALS — BP 149/74 | HR 106 | Temp 98.1°F | Ht 62.0 in | Wt 201.4 lb

## 2013-04-01 DIAGNOSIS — M949 Disorder of cartilage, unspecified: Secondary | ICD-10-CM

## 2013-04-01 DIAGNOSIS — E119 Type 2 diabetes mellitus without complications: Secondary | ICD-10-CM | POA: Diagnosis not present

## 2013-04-01 DIAGNOSIS — J449 Chronic obstructive pulmonary disease, unspecified: Secondary | ICD-10-CM

## 2013-04-01 DIAGNOSIS — I10 Essential (primary) hypertension: Secondary | ICD-10-CM | POA: Diagnosis not present

## 2013-04-01 DIAGNOSIS — E785 Hyperlipidemia, unspecified: Secondary | ICD-10-CM

## 2013-04-01 DIAGNOSIS — J441 Chronic obstructive pulmonary disease with (acute) exacerbation: Secondary | ICD-10-CM | POA: Insufficient documentation

## 2013-04-01 DIAGNOSIS — M8580 Other specified disorders of bone density and structure, unspecified site: Secondary | ICD-10-CM

## 2013-04-01 DIAGNOSIS — M899 Disorder of bone, unspecified: Secondary | ICD-10-CM | POA: Diagnosis not present

## 2013-04-01 DIAGNOSIS — M129 Arthropathy, unspecified: Secondary | ICD-10-CM

## 2013-04-01 DIAGNOSIS — G609 Hereditary and idiopathic neuropathy, unspecified: Secondary | ICD-10-CM | POA: Diagnosis not present

## 2013-04-01 DIAGNOSIS — M1990 Unspecified osteoarthritis, unspecified site: Secondary | ICD-10-CM

## 2013-04-01 DIAGNOSIS — E669 Obesity, unspecified: Secondary | ICD-10-CM

## 2013-04-01 DIAGNOSIS — G629 Polyneuropathy, unspecified: Secondary | ICD-10-CM

## 2013-04-01 LAB — POCT GLYCOSYLATED HEMOGLOBIN (HGB A1C): Hemoglobin A1C: 6.7

## 2013-04-01 MED ORDER — LOSARTAN POTASSIUM-HCTZ 100-25 MG PO TABS: 1.0000 | ORAL_TABLET | Freq: Every day | ORAL | Status: DC

## 2013-04-01 NOTE — Progress Notes (Signed)
Patient ID: Michaela Moon, female   DOB: 1935-12-04, 78 y.o.   MRN: [id number] SUBJECTIVE: CC: Chief Complaint  Patient presents with  . Follow-up    2 month follo up     HPI:  Patient is here for follow up of Diabetes Mellitus/HTN/HLD/COPD/Obesity: Symptoms evaluated: Denies Nocturia ,Denies Urinary Frequency , denies Blurred vision ,deniesDizziness,denies.Dysuria,denies paresthesias, denies extremity pain or ulcers.Marland Kitchendenies chest pain. has had an annual eye exam. do check the feet. Does check CBGs. Average CBG:150 to 160 Denies episodes of hypoglycemia. Does have an emergency hypoglycemic plan. admits toCompliance with medications. Denies Problems with medications.   Had the flu 3 weeks ago and sweated it out in the bed at home.  Better now. Doing good now.   Past Medical History  Diagnosis Date  . Hypertension   . Hyperlipidemia   . Diabetes mellitus without complication   . Arthritis   . Obesity   . Osteopenia   . COPD (chronic obstructive pulmonary disease)    No past surgical history on file. History   Social History  . Marital Status: Single    Spouse Name: N/A    Number of Children: N/A  . Years of Education: N/A   Occupational History  . Not on file.   Social History Main Topics  . Smoking status: Former Smoker    Types: Cigarettes    Quit date: 02/25/2002  . Smokeless tobacco: Not on file  . Alcohol Use: Not on file  . Drug Use: Not on file  . Sexual Activity: Not on file   Other Topics Concern  . Not on file   Social History Narrative  . No narrative on file   No family history on file. Current Outpatient Prescriptions on File Prior to Visit  Medication Sig Dispense Refill  . albuterol-ipratropium (COMBIVENT) 18-103 MCG/ACT inhaler Inhale 2 puffs into the lungs every 6 (six) hours as needed for wheezing.  14.7 g  2  . aspirin 81 MG tablet Take 81 mg by mouth daily.      Marland Kitchen atorvastatin (LIPITOR) 80 MG tablet Alternate taking 1 tablet every  other day and 1/2 tablet every other day (to average 68m daily)  68 tablet  1  . gabapentin (NEURONTIN) 100 MG capsule Take 3 capsules (300 mg total) by mouth 3 (three) times daily. Start with 1 capsule daily for 2 days then twice  A day for 2 days then 3 times a day after that  90 capsule  3  . metFORMIN (GLUCOPHAGE) 1000 MG tablet Take 1 tablet (1,000 mg total) by mouth 2 (two) times daily with a meal.  60 tablet  3  . naproxen (NAPROSYN) 500 MG tablet Take 1 tab po bid prn pain, take with meal.  60 tablet  0  . sitaGLIPtin (JANUVIA) 100 MG tablet Take 1 tablet (100 mg total) by mouth daily.  30 tablet  3  . solifenacin (VESICARE) 10 MG tablet Take 1 tablet (10 mg total) by mouth daily.  30 tablet  2   No current facility-administered medications on file prior to visit.   No Known Allergies Immunization History  Administered Date(s) Administered  . Influenza-Unspecified 10/26/2012  . Pneumococcal Conjugate-13 02/26/2004  . Zoster 03/29/2007   Prior to Admission medications   Medication Sig Start Date End Date Taking? Authorizing Provider  albuterol-ipratropium (COMBIVENT) 18-103 MCG/ACT inhaler Inhale 2 puffs into the lungs every 6 (six) hours as needed for wheezing. 11/27/12   FVernie Shanks MD  aspirin 81  MG tablet Take 81 mg by mouth daily.    Historical Provider, MD  atorvastatin (LIPITOR) 80 MG tablet Alternate taking 1 tablet every other day and 1/2 tablet every other day (to average 87m daily) 12/24/12   Tammy Eckard, PHARMD  gabapentin (NEURONTIN) 100 MG capsule Take 3 capsules (300 mg total) by mouth 3 (three) times daily. Start with 1 capsule daily for 2 days then twice  A day for 2 days then 3 times a day after that 11/27/12   FVernie Shanks MD  losartan-hydrochlorothiazide (HYZAAR) 100-12.5 MG per tablet Take 1 tablet by mouth daily. 11/27/12   FVernie Shanks MD  metFORMIN (GLUCOPHAGE) 1000 MG tablet Take 1 tablet (1,000 mg total) by mouth 2 (two) times daily with a meal.  11/27/12   FVernie Shanks MD  naproxen (NAPROSYN) 500 MG tablet Take 1 tab po bid prn pain, take with meal. 02/23/13   FVernie Shanks MD  sitaGLIPtin (JANUVIA) 100 MG tablet Take 1 tablet (100 mg total) by mouth daily. 11/27/12   FVernie Shanks MD  solifenacin (VESICARE) 10 MG tablet Take 1 tablet (10 mg total) by mouth daily. 02/23/13   FVernie Shanks MD     ROS: As above in the HPI. All other systems are stable or negative.  OBJECTIVE: APPEARANCE:  Patient in no acute distress.The patient appeared well nourished and normally developed. Acyanotic. Waist: VITAL SIGNS:BP 149/74  Pulse 106  Temp(Src) 98.1 F (36.7 C) (Oral)  Ht 5' 2"  (1.575 m)  Wt 201 lb 6.4 oz (91.354 kg)  BMI 36.83 kg/m2  SpO2 94% WF Obese  SKIN: warm and  Dry without overt rashes, tattoos and scars  HEAD and Neck: without JVD, Head and scalp: normal Eyes:No scleral icterus. Fundi normal, eye movements normal. Ears: Auricle normal, canal normal, Tympanic membranes normal, insufflation normal. Nose: normal Throat: normal Neck & thyroid: normal  CHEST & LUNGS: Chest wall: normal Lungs: Clear  CVS: Reveals the PMI to be normally located. Regular rhythm, First and Second Heart sounds are normal,  absence of murmurs, rubs or gallops. Peripheral vasculature: Radial pulses: normal Dorsal pedis pulses: normal Posterior pulses: normal  ABDOMEN:  Appearance: obese Benign, no organomegaly, no masses, no Abdominal Aortic enlargement. No Guarding , no rebound. No Bruits. Bowel sounds: normal  RECTAL: N/A GU: N/A  EXTREMETIES: nonedematous.  MUSCULOSKELETAL:  Spine: normal Joints: intact  NEUROLOGIC: oriented to time,place and person; nonfocal. Strength is normal Sensory is normal Reflexes are normal Cranial Nerves are normal.   Results for orders placed in visit on 12/24/12  BMP8+EGFR      Result Value Range   Glucose 124 (*) 65 - 99 mg/dL   BUN 11  8 - 27 mg/dL   Creatinine, Ser 0.72   0.57 - 1.00 mg/dL   GFR calc non Af Amer 82  >59 mL/min/1.73   GFR calc Af Amer 95  >59 mL/min/1.73   BUN/Creatinine Ratio 15  11 - 26   Sodium 140  134 - 144 mmol/L   Potassium 4.6  3.5 - 5.2 mmol/L   Chloride 99  97 - 108 mmol/L   CO2 25  18 - 29 mmol/L   Calcium 10.6 (*) 8.6 - 10.2 mg/dL   ASSESSMENT:  Diabetes mellitus without complication - Plan: POCT glycosylated hemoglobin (Hb A1C), CMP14+EGFR  Peripheral neuropathy  Osteopenia  Obesity  Hypertension - Plan: CMP14+EGFR, losartan-hydrochlorothiazide (HYZAAR) 100-25 MG per tablet  Hyperlipidemia - Plan: NMR, lipoprofile  Arthritis  Hypercalcemia  COPD (chronic obstructive pulmonary disease)  PLAN:      Dr Paula Libra Recommendations  For nutrition information, I recommend books:  1).Eat to Live by Dr Excell Seltzer. 2).Prevent and Reverse Heart Disease by Dr Karl Luke. 3) Dr Janene Harvey Book:  Program to Reverse Diabetes  Exercise recommendations are:  If unable to walk, then the patient can exercise in a chair 3 times a day. By flapping arms like a bird gently and raising legs outwards to the front.  If ambulatory, the patient can go for walks for 30 minutes 3 times a week. Then increase the intensity and duration as tolerated.  Goal is to try to attain exercise frequency to 5 times a week.  If applicable: Best to perform resistance exercises (machines or weights) 2 days a week and cardio type exercises 3 days per week.  Diabetes foot care. Handout in the AVS.   Orders Placed This Encounter  Procedures  . CMP14+EGFR  . NMR, lipoprofile  . POCT glycosylated hemoglobin (Hb A1C)   Meds ordered this encounter  Medications  . losartan-hydrochlorothiazide (HYZAAR) 100-25 MG per tablet    Sig: Take 1 tablet by mouth daily.    Dispense:  30 tablet    Refill:  5   Medications Discontinued During This Encounter  Medication Reason  . losartan-hydrochlorothiazide (HYZAAR) 100-12.5 MG per tablet  Dose change   Return in about 6 weeks (around 05/13/2013) for Recheck medical problems, recheck BP.  Koichi Platte P. Jacelyn Grip, M.D.

## 2013-04-01 NOTE — Patient Instructions (Signed)
    Dr Dimitrios Balestrieri's Recommendations  For nutrition information, I recommend books:  1).Eat to Live by Dr Joel Fuhrman. 2).Prevent and Reverse Heart Disease by Dr Caldwell Esselstyn. 3) Dr Neal Barnard's Book:  Program to Reverse Diabetes  Exercise recommendations are:  If unable to walk, then the patient can exercise in a chair 3 times a day. By flapping arms like a bird gently and raising legs outwards to the front.  If ambulatory, the patient can go for walks for 30 minutes 3 times a week. Then increase the intensity and duration as tolerated.  Goal is to try to attain exercise frequency to 5 times a week.  If applicable: Best to perform resistance exercises (machines or weights) 2 days a week and cardio type exercises 3 days per week.   Diabetes and Foot Care Diabetes may cause you to have problems because of poor blood supply (circulation) to your feet and legs. This may cause the skin on your feet to become thinner, break easier, and heal more slowly. Your skin may become dry, and the skin may peel and crack. You may also have nerve damage in your legs and feet causing decreased feeling in them. You may not notice minor injuries to your feet that could lead to infections or more serious problems. Taking care of your feet is one of the most important things you can do for yourself.  HOME CARE INSTRUCTIONS  Wear shoes at all times, even in the house. Do not go barefoot. Bare feet are easily injured.  Check your feet daily for blisters, cuts, and redness. If you cannot see the bottom of your feet, use a mirror or ask someone for help.  Wash your feet with warm water (do not use hot water) and mild soap. Then pat your feet and the areas between your toes until they are completely dry. Do not soak your feet as this can dry your skin.  Apply a moisturizing lotion or petroleum jelly (that does not contain alcohol and is unscented) to the skin on your feet and to dry, brittle toenails.  Do not apply lotion between your toes.  Trim your toenails straight across. Do not dig under them or around the cuticle. File the edges of your nails with an emery board or nail file.  Do not cut corns or calluses or try to remove them with medicine.  Wear clean socks or stockings every day. Make sure they are not too tight. Do not wear knee-high stockings since they may decrease blood flow to your legs.  Wear shoes that fit properly and have enough cushioning. To break in new shoes, wear them for just a few hours a day. This prevents you from injuring your feet. Always look in your shoes before you put them on to be sure there are no objects inside.  Do not cross your legs. This may decrease the blood flow to your feet.  If you find a minor scrape, cut, or break in the skin on your feet, keep it and the skin around it clean and dry. These areas may be cleansed with mild soap and water. Do not cleanse the area with peroxide, alcohol, or iodine.  When you remove an adhesive bandage, be sure not to damage the skin around it.  If you have a wound, look at it several times a day to make sure it is healing.  Do not use heating pads or hot water bottles. They may burn your skin. If   you have lost feeling in your feet or legs, you may not know it is happening until it is too late.  Make sure your health care provider performs a complete foot exam at least annually or more often if you have foot problems. Report any cuts, sores, or bruises to your health care provider immediately. SEEK MEDICAL CARE IF:   You have an injury that is not healing.  You have cuts or breaks in the skin.  You have an ingrown nail.  You notice redness on your legs or feet.  You feel burning or tingling in your legs or feet.  You have pain or cramps in your legs and feet.  Your legs or feet are numb.  Your feet always feel cold. SEEK IMMEDIATE MEDICAL CARE IF:   There is increasing redness, swelling, or pain in  or around a wound.  There is a red line that goes up your leg.  Pus is coming from a wound.  You develop a fever or as directed by your health care provider.  You notice a bad smell coming from an ulcer or wound. Document Released: 02/09/2000 Document Revised: 10/14/2012 Document Reviewed: 07/21/2012 [employer] Patient Information 2014 [employer].  

## 2013-04-02 LAB — CMP14+EGFR
ALT: 46 IU/L — ABNORMAL HIGH (ref 0–32)
AST: 39 IU/L (ref 0–40)
Albumin/Globulin Ratio: 1.5 (ref 1.1–2.5)
Albumin: 4.7 g/dL (ref 3.5–4.8)
Alkaline Phosphatase: 56 IU/L (ref 39–117)
BUN/Creatinine Ratio: 13 (ref 11–26)
BUN: 10 mg/dL (ref 8–27)
CO2: 23 mmol/L (ref 18–29)
Calcium: 10.8 mg/dL — ABNORMAL HIGH (ref 8.7–10.3)
Chloride: 100 mmol/L (ref 97–108)
Creatinine, Ser: 0.77 mg/dL (ref 0.57–1.00)
GFR calc Af Amer: 87 mL/min/{1.73_m2} (ref >59)
GFR calc non Af Amer: 76 mL/min/{1.73_m2} (ref >59)
Globulin, Total: 3.2 g/dL (ref 1.5–4.5)
Glucose: 132 mg/dL — ABNORMAL HIGH (ref 65–99)
Potassium: 4.1 mmol/L (ref 3.5–5.2)
Sodium: 139 mmol/L (ref 134–144)
Total Bilirubin: 0.4 mg/dL (ref 0.0–1.2)
Total Protein: 7.9 g/dL (ref 6.0–8.5)

## 2013-04-02 LAB — NMR, LIPOPROFILE
Cholesterol: 155 mg/dL (ref <200)
HDL Cholesterol by NMR: 36 mg/dL — ABNORMAL LOW (ref >=40)
HDL Particle Number: 26.3 umol/L — ABNORMAL LOW (ref >=30.5)
LDL Particle Number: 1201 nmol/L — ABNORMAL HIGH (ref <1000)
LDL Size: 20.3 nm — ABNORMAL LOW (ref >20.5)
LDLC SERPL CALC-MCNC: 72 mg/dL (ref <100)
LP-IR Score: 71 — ABNORMAL HIGH (ref <=45)
Small LDL Particle Number: 644 nmol/L — ABNORMAL HIGH (ref <=527)
Triglycerides by NMR: 234 mg/dL — ABNORMAL HIGH (ref <150)

## 2013-04-28 ENCOUNTER — Encounter: Payer: Self-pay | Admitting: Family Medicine

## 2013-04-28 ENCOUNTER — Ambulatory Visit (INDEPENDENT_AMBULATORY_CARE_PROVIDER_SITE_OTHER): Payer: Medicare Other | Admitting: Family Medicine

## 2013-04-28 VITALS — BP 153/66 | HR 72 | Temp 98.4°F | Ht 62.0 in | Wt 202.4 lb

## 2013-04-28 DIAGNOSIS — M542 Cervicalgia: Secondary | ICD-10-CM | POA: Diagnosis not present

## 2013-04-28 DIAGNOSIS — J069 Acute upper respiratory infection, unspecified: Secondary | ICD-10-CM

## 2013-04-28 MED ORDER — CYCLOBENZAPRINE HCL 5 MG PO TABS: 5.0000 mg | ORAL_TABLET | Freq: Three times a day (TID) | ORAL | Status: DC | PRN

## 2013-04-28 MED ORDER — AZITHROMYCIN 250 MG PO TABS: ORAL_TABLET | ORAL | Status: DC

## 2013-04-28 NOTE — Progress Notes (Signed)
   Subjective:    Patient ID: Michaela Moon, female    DOB: 03-Feb-1936, 78 y.o.   MRN: [id number]  HPI This 78 y.o. female presents for evaluation of URI sx's and headache and neck discomfort.   Review of Systems C/o uri sx's and neck pain. No chest pain, SOB, HA, dizziness, vision change, N/V, diarrhea, constipation, dysuria, urinary urgency or frequency, myalgias, arthralgias or rash.     Objective:   Physical Exam Vital signs noted  Well developed well nourished female.  HEENT - Head atraumatic Normocephalic                Eyes - PERRLA, Conjuctiva - clear Sclera- Clear EOMI                Ears - EAC's Wnl TM's Wnl Gross Hearing WNL                Throat - oropharanx wnl Respiratory - Lungs CTA bilateral Cardiac - RRR S1 and S2 without murmur GI - Abdomen soft Nontender and bowel sounds active x 4 Extremities - No edema. Neuro - Grossly intact. MS - TTP cervical paraspinous muscles      Assessment & Plan:  URI (upper respiratory infection) - Plan: azithromycin (ZITHROMAX) 250 MG tablet  Cervicalgia - Plan: cyclobenzaprine (FLEXERIL) 5 MG tablet  Push po fluids, rest, tylenol and motrin otc prn as directed for fever, arthralgias, and myalgias.  Follow up prn if sx's continue or persist.  Lysbeth Penner FNP

## 2013-05-05 DIAGNOSIS — H2510 Age-related nuclear cataract, unspecified eye: Secondary | ICD-10-CM | POA: Diagnosis not present

## 2013-05-05 DIAGNOSIS — E119 Type 2 diabetes mellitus without complications: Secondary | ICD-10-CM | POA: Diagnosis not present

## 2013-05-05 DIAGNOSIS — H1045 Other chronic allergic conjunctivitis: Secondary | ICD-10-CM | POA: Diagnosis not present

## 2013-05-10 ENCOUNTER — Other Ambulatory Visit: Payer: Self-pay | Admitting: Family Medicine

## 2013-05-11 ENCOUNTER — Telehealth: Payer: Self-pay | Admitting: Family Medicine

## 2013-05-12 NOTE — Telephone Encounter (Signed)
Call patient : Prescription refilled & sent to pharmacy in [place]. 

## 2013-05-12 NOTE — Telephone Encounter (Signed)
Can you review Gabapentin directions and verify. Thanks.

## 2013-05-13 ENCOUNTER — Ambulatory Visit (INDEPENDENT_AMBULATORY_CARE_PROVIDER_SITE_OTHER): Payer: Medicare Other | Admitting: Family Medicine

## 2013-05-13 ENCOUNTER — Telehealth: Payer: Self-pay | Admitting: Family Medicine

## 2013-05-13 ENCOUNTER — Encounter: Payer: Self-pay | Admitting: Family Medicine

## 2013-05-13 VITALS — BP 154/73 | HR 90 | Temp 97.5°F | Ht 62.0 in | Wt 200.0 lb

## 2013-05-13 DIAGNOSIS — J449 Chronic obstructive pulmonary disease, unspecified: Secondary | ICD-10-CM | POA: Diagnosis not present

## 2013-05-13 DIAGNOSIS — G609 Hereditary and idiopathic neuropathy, unspecified: Secondary | ICD-10-CM

## 2013-05-13 DIAGNOSIS — M899 Disorder of bone, unspecified: Secondary | ICD-10-CM | POA: Diagnosis not present

## 2013-05-13 DIAGNOSIS — M949 Disorder of cartilage, unspecified: Secondary | ICD-10-CM

## 2013-05-13 DIAGNOSIS — I10 Essential (primary) hypertension: Secondary | ICD-10-CM | POA: Diagnosis not present

## 2013-05-13 DIAGNOSIS — M549 Dorsalgia, unspecified: Secondary | ICD-10-CM | POA: Diagnosis not present

## 2013-05-13 DIAGNOSIS — M8580 Other specified disorders of bone density and structure, unspecified site: Secondary | ICD-10-CM

## 2013-05-13 DIAGNOSIS — E119 Type 2 diabetes mellitus without complications: Secondary | ICD-10-CM

## 2013-05-13 DIAGNOSIS — E669 Obesity, unspecified: Secondary | ICD-10-CM

## 2013-05-13 DIAGNOSIS — E785 Hyperlipidemia, unspecified: Secondary | ICD-10-CM

## 2013-05-13 DIAGNOSIS — G629 Polyneuropathy, unspecified: Secondary | ICD-10-CM

## 2013-05-13 DIAGNOSIS — J4489 Other specified chronic obstructive pulmonary disease: Secondary | ICD-10-CM

## 2013-05-13 MED ORDER — AMLODIPINE BESYLATE 2.5 MG PO TABS: 2.5000 mg | ORAL_TABLET | Freq: Every day | ORAL | Status: DC

## 2013-05-13 MED ORDER — KETOROLAC TROMETHAMINE 60 MG/2ML IM SOLN
60.0000 mg | Freq: Once | INTRAMUSCULAR | Status: AC
  Administered 2013-05-13: 60 mg via INTRAMUSCULAR

## 2013-05-13 MED ORDER — NAPROXEN 500 MG PO TABS: ORAL_TABLET | ORAL | Status: DC

## 2013-05-13 NOTE — Progress Notes (Signed)
Patient ID: Michaela Moon, female   DOB: Jun 25, 1935, 78 y.o.   MRN: [id number] SUBJECTIVE: CC: Chief Complaint  Patient presents with  . Follow-up    6 week follow up refill naproxen. c/o rt shoulder pain    HPI: Patient is here for follow up of hypertension/HLD/DM: denies Headache;deniesChest Pain;denies weakness;denies Shortness of Breath or Orthopnea;denies Visual changes;denies palpitations;denies cough;denies pedal edema;denies symptoms of TIA or stroke; admits to Compliance with medications. denies Problems with medications.  Right shoulder: the right scapular area is sore. Pain aggravated by reaching and internal rotation movements. No cough, no SOB.  Past Medical History  Diagnosis Date  . Hypertension   . Hyperlipidemia   . Diabetes mellitus without complication   . Arthritis   . Obesity   . Osteopenia   . COPD (chronic obstructive pulmonary disease)    No past surgical history on file. History   Social History  . Marital Status: Single    Spouse Name: N/A    Number of Children: N/A  . Years of Education: N/A   Occupational History  . Not on file.   Social History Main Topics  . Smoking status: Former Smoker    Types: Cigarettes    Quit date: 02/25/2002  . Smokeless tobacco: Not on file  . Alcohol Use: Not on file  . Drug Use: Not on file  . Sexual Activity: Not on file   Other Topics Concern  . Not on file   Social History Narrative  . No narrative on file   No family history on file. Current Outpatient Prescriptions on File Prior to Visit  Medication Sig Dispense Refill  . aspirin 81 MG tablet Take 81 mg by mouth daily.      Marland Kitchen atorvastatin (LIPITOR) 80 MG tablet Alternate taking 1 tablet every other day and 1/2 tablet every other day (to average 67m daily)  68 tablet  1  . gabapentin (NEURONTIN) 100 MG capsule Take 1 capsule (100 mg total) by mouth 3 (three) times daily.  90 capsule  1  . losartan-hydrochlorothiazide (HYZAAR) 100-25 MG per tablet  Take 1 tablet by mouth daily.  30 tablet  5  . metFORMIN (GLUCOPHAGE) 1000 MG tablet TAKE ONE TABLET TWICE A DAY WITH FOOD  60 tablet  2  . sitaGLIPtin (JANUVIA) 100 MG tablet Take 1 tablet (100 mg total) by mouth daily.  30 tablet  3  . solifenacin (VESICARE) 10 MG tablet Take 1 tablet (10 mg total) by mouth daily.  30 tablet  2  . albuterol-ipratropium (COMBIVENT) 18-103 MCG/ACT inhaler Inhale 2 puffs into the lungs every 6 (six) hours as needed for wheezing.  14.7 g  2  . azithromycin (ZITHROMAX) 250 MG tablet Take 2 po first day and then one po qd x 4 days  6 tablet  0  . cyclobenzaprine (FLEXERIL) 5 MG tablet Take 1 tablet (5 mg total) by mouth 3 (three) times daily as needed for muscle spasms.  30 tablet  1   No current facility-administered medications on file prior to visit.   No Known Allergies Immunization History  Administered Date(s) Administered  . Influenza-Unspecified 10/26/2012  . Pneumococcal Conjugate-13 02/26/2004  . Zoster 03/29/2007   Prior to Admission medications   Medication Sig Start Date End Date Taking? Authorizing Provider  aspirin 81 MG tablet Take 81 mg by mouth daily.   Yes Historical Provider, MD  atorvastatin (LIPITOR) 80 MG tablet Alternate taking 1 tablet every other day and 1/2 tablet every other  day (to average 56m daily) 12/24/12  Yes Tammy Eckard, PHARMD  gabapentin (NEURONTIN) 100 MG capsule Take 1 capsule (100 mg total) by mouth 3 (three) times daily.   Yes FVernie Shanks MD  losartan-hydrochlorothiazide (HYZAAR) 100-25 MG per tablet Take 1 tablet by mouth daily. 04/01/13  Yes FVernie Shanks MD  metFORMIN (GLUCOPHAGE) 1000 MG tablet TAKE ONE TABLET TWICE A DAY WITH FOOD   Yes FVernie Shanks MD  naproxen (NAPROSYN) 500 MG tablet Take 1 tab po bid prn pain, take with meal. 02/23/13  Yes FVernie Shanks MD  sitaGLIPtin (JANUVIA) 100 MG tablet Take 1 tablet (100 mg total) by mouth daily. 11/27/12  Yes FVernie Shanks MD  solifenacin (VESICARE) 10 MG  tablet Take 1 tablet (10 mg total) by mouth daily. 02/23/13  Yes FVernie Shanks MD  albuterol-ipratropium (COMBIVENT) 18-103 MCG/ACT inhaler Inhale 2 puffs into the lungs every 6 (six) hours as needed for wheezing. 11/27/12   FVernie Shanks MD  azithromycin (ZITHROMAX) 250 MG tablet Take 2 po first day and then one po qd x 4 days 04/28/13   WLysbeth Penner FNP  cyclobenzaprine (FLEXERIL) 5 MG tablet Take 1 tablet (5 mg total) by mouth 3 (three) times daily as needed for muscle spasms. 04/28/13   WLysbeth Penner FNP     ROS: As above in the HPI. All other systems are stable or negative.  OBJECTIVE: APPEARANCE:  Patient in no acute distress.The patient appeared well nourished and normally developed. Acyanotic. Waist: VITAL SIGNS:BP 154/73  Pulse 90  Temp(Src) 97.5 F (36.4 C) (Oral)  Ht _0  (1.575 m)  Wt 200 lb (90.719 kg)  BMI 36.57 kg/m2  WF obese  SKIN: warm and  Dry without overt rashes, tattoos and scars  HEAD and Neck: without JVD, Head and scalp: normal Eyes:No scleral icterus. Fundi normal, eye movements normal. Ears: Auricle normal, canal normal, Tympanic membranes normal, insufflation normal. Nose: normal Throat: normal Neck & thyroid: normal  CHEST & LUNGS: Chest wall: normal Lungs: Clear  CVS: Reveals the PMI to be normally located. Regular rhythm, First and Second Heart sounds are normal,  absence of murmurs, rubs or gallops. Peripheral vasculature: Radial pulses: normal Dorsal pedis pulses: normal Posterior pulses: normal  ABDOMEN:  Appearance: Obese Benign, no organomegaly, no masses, no Abdominal Aortic enlargement. No Guarding , no rebound. No Bruits. Bowel sounds: normal  RECTAL: N/A GU: N/A  EXTREMETIES: nonedematous.  MUSCULOSKELETAL:  Spine: normal Joints: right scapula area is tender to palpation. Internal rotation of the right shoulder and reaching back to scratch her back produces pain.  NEUROLOGIC: oriented to time,place and  person; nonfocal. Strength is normal Sensory is normal Reflexes are normal Cranial Nerves are normal.   Results for orders placed in visit on 04/01/13  CMP14+EGFR      Result Value Ref Range   Glucose 132 (*) 65 - 99 mg/dL   BUN 10  8 - 27 mg/dL   Creatinine, Ser 0.77  0.57 - 1.00 mg/dL   GFR calc non Af Amer 76  >59 mL/min/1.73   GFR calc Af Amer 87  >59 mL/min/1.73   BUN/Creatinine Ratio 13  11 - 26   Sodium 139  134 - 144 mmol/L   Potassium 4.1  3.5 - 5.2 mmol/L   Chloride 100  97 - 108 mmol/L   CO2 23  18 - 29 mmol/L   Calcium 10.8 (*) 8.7 - 10.3 mg/dL   Total Protein 7.9  6.0 - 8.5 g/dL   Albumin 4.7  3.5 - 4.8 g/dL   Globulin, Total 3.2  1.5 - 4.5 g/dL   Albumin/Globulin Ratio 1.5  1.1 - 2.5   Total Bilirubin 0.4  0.0 - 1.2 mg/dL   Alkaline Phosphatase 56  39 - 117 IU/L   AST 39  0 - 40 IU/L   ALT 46 (*) 0 - 32 IU/L  NMR, LIPOPROFILE      Result Value Ref Range   LDL Particle Number [id number] (*) <1000 nmol/L   LDLC SERPL CALC-MCNC 72  <100 mg/dL   HDL Cholesterol by NMR 36 (*) >=40 mg/dL   Triglycerides by NMR 234 (*) <150 mg/dL   Cholesterol 155  <200 mg/dL   HDL Particle Number [id number] (*) >=30.5 umol/L   Small LDL Particle Number [id number] (*) <=527 nmol/L   LDL Size 20.3 (*) >20.5 nm   LP-IR Score 71 (*) <=45  POCT GLYCOSYLATED HEMOGLOBIN (HGB A1C)      Result Value Ref Range   Hemoglobin A1C 6.7      ASSESSMENT:  Peripheral neuropathy  Osteopenia  Obesity  Hypertension - Plan: amLODipine (NORVASC) 2.5 MG tablet, BMP8+EGFR  Diabetes mellitus without complication  COPD (chronic obstructive pulmonary disease)  Hyperlipidemia  Hypercalcemia  Back pain - Plan: naproxen (NAPROSYN) 500 MG tablet, ketorolac (TORADOL) injection 60 mg  PLAN: Heat and massage to the area. NSAID. Caution with NSAID  Orders Placed This Encounter  Procedures  . BMP8+EGFR   Meds ordered this encounter  Medications  . amLODipine (NORVASC) 2.5 MG tablet    Sig: Take 1 tablet  (2.5 mg total) by mouth daily.    Dispense:  30 tablet    Refill:  5  . naproxen (NAPROSYN) 500 MG tablet    Sig: Take 1 tab po bid prn pain, take with meal.    Dispense:  60 tablet    Refill:  1    prn  . ketorolac (TORADOL) injection 60 mg    Sig:    Medications Discontinued During This Encounter  Medication Reason  . naproxen (NAPROSYN) 500 MG tablet Reorder   Return in about 4 weeks (around 06/10/2013) for recheck BP.  Chukwuemeka Artola P. Jacelyn Grip, M.D.

## 2013-05-13 NOTE — Progress Notes (Signed)
Tolerated toradol injection without difficulty .

## 2013-05-13 NOTE — Telephone Encounter (Signed)
April 16th at 2:20 Patient aware

## 2013-05-13 NOTE — Patient Instructions (Signed)
Ketorolac injection What is this medicine? KETOROLAC (kee toe ROLE ak) is a non-steroidal anti-inflammatory drug (NSAID). It is used to treat moderate to severe pain for up to 5 days. It is commonly used after surgery. This medicine should not be used for more than 5 days. This medicine may be used for other purposes; ask your health care provider or pharmacist if you have questions. COMMON BRAND NAME(S): Toradol What should I tell my health care provider before I take this medicine? They need to know if you have any of these conditions: -asthma, especially aspirin-sensitive asthma -bleeding problems -kidney disease -stomach bleed, ulcer, or other problem -taking aspirin, other NSAID, or probenecid -an unusual or allergic reaction to ketorolac, tromethamine, aspirin, other NSAIDs, other medicines, foods, dyes or preservatives -pregnant or trying to get pregnant -breast-feeding How should I use this medicine? This medicine is for injection into a muscle or into a vein. It is given by a health care professional in a hospital or clinic setting. Talk to your pediatrician regarding the use of this medicine in children. While this drug may be prescribed for children as young as 74 years old for selected conditions, precautions do apply. Patients over 44 years old may have a stronger reaction and need a smaller dose. Overdosage: If you think you have taken too much of this medicine contact a poison control center or emergency room at once. NOTE: This medicine is only for you. Do not share this medicine with others. What if I miss a dose? This does not apply. What may interact with this medicine? Do not take this medicine with any of the following medications: -aspirin and aspirin-like medicines -cidofovir -methotrexate -NSAIDs, medicines for pain and inflammation, like ibuprofen or naproxen -pentoxifylline -probenecid This medicine may also interact with the following  medications: -alcohol -alendronate -alprazolam -carbamazepine -diuretics -flavocoxid -fluoxetine -ginkgo -lithium -medicines for blood pressure like enalapril -medicines that affect platelets like pentoxifylline -medicines that treat or prevent blood clots like heparin, warfarin -muscle relaxants -pemetrexed -phenytoin -thiothixene This list may not describe all possible interactions. Give your health care provider a list of all the medicines, herbs, non-prescription drugs, or dietary supplements you use. Also tell them if you smoke, drink alcohol, or use illegal drugs. Some items may interact with your medicine. What should I watch for while using this medicine? Tell your doctor or healthcare professional if your symptoms do not start to get better or if they get worse. This medicine does not prevent heart attack or stroke. In fact, this medicine may increase the chance of a heart attack or stroke. The chance may increase with longer use of this medicine and in people who have heart disease. If you take aspirin to prevent heart attack or stroke, talk with your doctor or health care professional. Do not take medicines such as ibuprofen and naproxen with this medicine. Side effects such as stomach upset, nausea, or ulcers may be more likely to occur. Many medicines available without a prescription should not be taken with this medicine. This medicine can cause ulcers and bleeding in the stomach and intestines at any time during treatment. Do not smoke cigarettes or drink alcohol. These increase irritation to your stomach and can make it more susceptible to damage from this medicine. Ulcers and bleeding can happen without warning symptoms and can cause death. This medicine can cause you to bleed more easily. Try to avoid damage to your teeth and gums when you brush or floss your teeth. What side effects may  I notice from receiving this medicine? Side effects that you should report to your  doctor or health care professional as soon as possible: -allergic reactions like skin rash, itching or hives, swelling of the face, lips, or tongue -breathing problems -high blood pressure -nausea, vomiting -redness, blistering, peeling or loosening of the skin, including inside the mouth -severe stomach pain -signs and symptoms of bleeding such as bloody or black, tarry stools; red or dark-brown urine; spitting up blood or brown material that looks like coffee grounds; red spots on the skin; unusual bruising or bleeding from the eye, gums, or nose -signs and symptoms of a blood clot changes in vision; chest pain; severe, sudden headache; trouble speaking; sudden numbness or weakness of the face, arm, or leg -trouble passing urine or change in the amount of urine -unexplained weight gain or swelling -unusually weak or tired -yellowing of eyes or skin  Side effects that usually do not require medical attention (report to your doctor or health care professional if they continue or are bothersome): -diarrhea -dizziness -headache -heartburn This list may not describe all possible side effects. Call your doctor for medical advice about side effects. You may report side effects to FDA at 1-800-FDA-1088. Where should I keep my medicine? This drug is given in a hospital or clinic and will not be stored at home. NOTE: This sheet is a summary. It may not cover all possible information. If you have questions about this medicine, talk to your doctor, pharmacist, or health care provider.  2014, [employer] Standard. (2012-06-30 16:34:56)  

## 2013-05-14 ENCOUNTER — Telehealth: Payer: Self-pay | Admitting: Family Medicine

## 2013-05-14 LAB — BMP8+EGFR
BUN/Creatinine Ratio: 12 (ref 11–26)
BUN: 10 mg/dL (ref 8–27)
CO2: 22 mmol/L (ref 18–29)
Calcium: 10.8 mg/dL — ABNORMAL HIGH (ref 8.7–10.3)
Chloride: 101 mmol/L (ref 97–108)
Creatinine, Ser: 0.85 mg/dL (ref 0.57–1.00)
GFR calc Af Amer: 78 mL/min/{1.73_m2} (ref >59)
GFR calc non Af Amer: 67 mL/min/{1.73_m2} (ref >59)
Glucose: 127 mg/dL — ABNORMAL HIGH (ref 65–99)
Potassium: 4.2 mmol/L (ref 3.5–5.2)
Sodium: 141 mmol/L (ref 134–144)

## 2013-05-14 NOTE — Telephone Encounter (Signed)
I added another BP medicine amlodipine and sent it to her pharmacy. I did not increase the rest.

## 2013-05-14 NOTE — Telephone Encounter (Signed)
Patient said you were going to up her BP and change it so what is she suppose to be on now they are confused please advise?

## 2013-05-14 NOTE — Telephone Encounter (Signed)
Patient aware.

## 2013-05-17 ENCOUNTER — Telehealth: Payer: Self-pay | Admitting: Family Medicine

## 2013-05-18 NOTE — Telephone Encounter (Signed)
To the ED for further evaluation

## 2013-05-18 NOTE — Telephone Encounter (Signed)
Patient did not sound to be in acute distress but states that pain is no better. Advised to seek care at ED. Patient was agreeable to this.

## 2013-05-19 DIAGNOSIS — K625 Hemorrhage of anus and rectum: Secondary | ICD-10-CM | POA: Diagnosis not present

## 2013-05-19 DIAGNOSIS — Z86010 Personal history of colonic polyps: Secondary | ICD-10-CM | POA: Diagnosis not present

## 2013-05-26 HISTORY — PX: COLONOSCOPY: SHX174

## 2013-05-28 ENCOUNTER — Other Ambulatory Visit: Payer: Self-pay | Admitting: Family Medicine

## 2013-05-28 DIAGNOSIS — IMO0002 Reserved for concepts with insufficient information to code with codable children: Secondary | ICD-10-CM | POA: Diagnosis not present

## 2013-06-02 ENCOUNTER — Other Ambulatory Visit: Payer: Self-pay | Admitting: Family Medicine

## 2013-06-03 DIAGNOSIS — K625 Hemorrhage of anus and rectum: Secondary | ICD-10-CM | POA: Diagnosis not present

## 2013-06-03 DIAGNOSIS — K5900 Constipation, unspecified: Secondary | ICD-10-CM | POA: Diagnosis not present

## 2013-06-03 DIAGNOSIS — Z860100 Personal history of colon polyps, unspecified: Secondary | ICD-10-CM | POA: Diagnosis not present

## 2013-06-03 DIAGNOSIS — Z980 Intestinal bypass and anastomosis status: Secondary | ICD-10-CM | POA: Diagnosis not present

## 2013-06-03 DIAGNOSIS — E119 Type 2 diabetes mellitus without complications: Secondary | ICD-10-CM | POA: Diagnosis not present

## 2013-06-03 DIAGNOSIS — I10 Essential (primary) hypertension: Secondary | ICD-10-CM | POA: Diagnosis not present

## 2013-06-03 DIAGNOSIS — Z1211 Encounter for screening for malignant neoplasm of colon: Secondary | ICD-10-CM | POA: Diagnosis not present

## 2013-06-03 DIAGNOSIS — Z79899 Other long term (current) drug therapy: Secondary | ICD-10-CM | POA: Diagnosis not present

## 2013-06-04 NOTE — Telephone Encounter (Signed)
Reports of too much side effects with flexeril in individuals over 76 years old. I think she should  Stop it. Refill denied. Bring all medications at next office visit.

## 2013-06-10 ENCOUNTER — Other Ambulatory Visit: Payer: Self-pay | Admitting: Family Medicine

## 2013-06-10 ENCOUNTER — Ambulatory Visit (INDEPENDENT_AMBULATORY_CARE_PROVIDER_SITE_OTHER): Payer: Medicare Other | Admitting: Family Medicine

## 2013-06-10 ENCOUNTER — Telehealth: Payer: Self-pay | Admitting: Family Medicine

## 2013-06-10 ENCOUNTER — Encounter: Payer: Self-pay | Admitting: Family Medicine

## 2013-06-10 VITALS — BP 130/60 | HR 94 | Temp 98.6°F | Ht 62.0 in | Wt 200.6 lb

## 2013-06-10 DIAGNOSIS — M949 Disorder of cartilage, unspecified: Secondary | ICD-10-CM

## 2013-06-10 DIAGNOSIS — E119 Type 2 diabetes mellitus without complications: Secondary | ICD-10-CM

## 2013-06-10 DIAGNOSIS — M1990 Unspecified osteoarthritis, unspecified site: Secondary | ICD-10-CM

## 2013-06-10 DIAGNOSIS — E669 Obesity, unspecified: Secondary | ICD-10-CM

## 2013-06-10 DIAGNOSIS — M899 Disorder of bone, unspecified: Secondary | ICD-10-CM | POA: Diagnosis not present

## 2013-06-10 DIAGNOSIS — J449 Chronic obstructive pulmonary disease, unspecified: Secondary | ICD-10-CM

## 2013-06-10 DIAGNOSIS — I10 Essential (primary) hypertension: Secondary | ICD-10-CM

## 2013-06-10 DIAGNOSIS — E785 Hyperlipidemia, unspecified: Secondary | ICD-10-CM

## 2013-06-10 DIAGNOSIS — M129 Arthropathy, unspecified: Secondary | ICD-10-CM

## 2013-06-10 DIAGNOSIS — G629 Polyneuropathy, unspecified: Secondary | ICD-10-CM

## 2013-06-10 DIAGNOSIS — E1149 Type 2 diabetes mellitus with other diabetic neurological complication: Secondary | ICD-10-CM

## 2013-06-10 DIAGNOSIS — M8580 Other specified disorders of bone density and structure, unspecified site: Secondary | ICD-10-CM

## 2013-06-10 DIAGNOSIS — G609 Hereditary and idiopathic neuropathy, unspecified: Secondary | ICD-10-CM

## 2013-06-10 MED ORDER — GABAPENTIN 100 MG PO CAPS: 100.0000 mg | ORAL_CAPSULE | Freq: Three times a day (TID) | ORAL | Status: DC

## 2013-06-10 MED ORDER — SITAGLIPTIN PHOSPHATE 100 MG PO TABS: 100.0000 mg | ORAL_TABLET | Freq: Every day | ORAL | Status: DC

## 2013-06-10 MED ORDER — METFORMIN HCL 1000 MG PO TABS
ORAL_TABLET | ORAL | Status: DC
Start: 2013-06-10 — End: 2021-04-24

## 2013-06-10 NOTE — Progress Notes (Signed)
Patient ID: Michaela Moon, female   DOB: 1935-11-20, 78 y.o.   MRN: [id number] SUBJECTIVE: CC: Chief Complaint  Patient presents with  . Follow-up    follow up BP  refill lmetformin  and januvia    HPI: Really here to recheck her BP with the addition of the amlodipine.  Patient is here for follow up of Diabetes Mellitus/HLD/HTN: Symptoms evaluated: Denies Nocturia ,Denies Urinary Frequency , denies Blurred vision ,deniesDizziness,denies.Dysuria,denies paresthesias, denies extremity pain or ulcers.Marland Kitchendenies chest pain. has had an annual eye exam. do check the feet. Does check CBGs. Average CBG:130 Denies episodes of hypoglycemia. Does have an emergency hypoglycemic plan. admits toCompliance with medications. Denies Problems with medications.  Past Medical History  Diagnosis Date  . Hypertension   . Hyperlipidemia   . Diabetes mellitus without complication   . Arthritis   . Obesity   . Osteopenia   . COPD (chronic obstructive pulmonary disease)    No past surgical history on file. History   Social History  . Marital Status: Single    Spouse Name: N/A    Number of Children: N/A  . Years of Education: N/A   Occupational History  . Not on file.   Social History Main Topics  . Smoking status: Former Smoker    Types: Cigarettes    Quit date: 02/25/2002  . Smokeless tobacco: Not on file  . Alcohol Use: Not on file  . Drug Use: Not on file  . Sexual Activity: Not on file   Other Topics Concern  . Not on file   Social History Narrative  . No narrative on file   No family history on file. Current Outpatient Prescriptions on File Prior to Visit  Medication Sig Dispense Refill  . albuterol-ipratropium (COMBIVENT) 18-103 MCG/ACT inhaler Inhale 2 puffs into the lungs every 6 (six) hours as needed for wheezing.  14.7 g  2  . amLODipine (NORVASC) 2.5 MG tablet Take 1 tablet (2.5 mg total) by mouth daily.  30 tablet  5  . aspirin 81 MG tablet Take 81 mg by mouth daily.       Marland Kitchen atorvastatin (LIPITOR) 80 MG tablet Alternate taking 1 tablet every other day and 1/2 tablet every other day (to average 66m daily)  68 tablet  1  . cyclobenzaprine (FLEXERIL) 5 MG tablet Take 1 tablet (5 mg total) by mouth 3 (three) times daily as needed for muscle spasms.  30 tablet  1  . naproxen (NAPROSYN) 500 MG tablet Take 1 tab po bid prn pain, take with meal.  60 tablet  1  . solifenacin (VESICARE) 10 MG tablet Take 1 tablet (10 mg total) by mouth daily.  30 tablet  2  . azithromycin (ZITHROMAX) 250 MG tablet Take 2 po first day and then one po qd x 4 days  6 tablet  0  . losartan-hydrochlorothiazide (HYZAAR) 100-25 MG per tablet Take 1 tablet by mouth daily.  30 tablet  5   No current facility-administered medications on file prior to visit.   No Known Allergies Immunization History  Administered Date(s) Administered  . Influenza-Unspecified 10/26/2012  . Pneumococcal Conjugate-13 02/26/2004  . Zoster 03/29/2007   Prior to Admission medications   Medication Sig Start Date End Date Taking? Authorizing Provider  albuterol-ipratropium (COMBIVENT) 18-103 MCG/ACT inhaler Inhale 2 puffs into the lungs every 6 (six) hours as needed for wheezing. 11/27/12   FVernie Shanks MD  amLODipine (NORVASC) 2.5 MG tablet Take 1 tablet (2.5 mg total) by mouth  daily. 05/13/13   Vernie Shanks, MD  aspirin 81 MG tablet Take 81 mg by mouth daily.    Historical Provider, MD  atorvastatin (LIPITOR) 80 MG tablet Alternate taking 1 tablet every other day and 1/2 tablet every other day (to average 32m daily) 12/24/12   Tammy Eckard, PHARMD  azithromycin (ZITHROMAX) 250 MG tablet Take 2 po first day and then one po qd x 4 days 04/28/13   WLysbeth Penner FNP  cyclobenzaprine (FLEXERIL) 5 MG tablet Take 1 tablet (5 mg total) by mouth 3 (three) times daily as needed for muscle spasms. 04/28/13   WLysbeth Penner FNP  gabapentin (NEURONTIN) 100 MG capsule Take 1 capsule (100 mg total) by mouth 3 (three) times  daily.    FVernie Shanks MD  losartan-hydrochlorothiazide (HYZAAR) 100-25 MG per tablet Take 1 tablet by mouth daily. 04/01/13   FVernie Shanks MD  metFORMIN (GLUCOPHAGE) 1000 MG tablet TAKE ONE TABLET TWICE A DAY WITH FOOD    FVernie Shanks MD  naproxen (NAPROSYN) 500 MG tablet Take 1 tab po bid prn pain, take with meal. 05/13/13   FVernie Shanks MD  sitaGLIPtin (JANUVIA) 100 MG tablet Take 1 tablet (100 mg total) by mouth daily. 11/27/12   FVernie Shanks MD  solifenacin (VESICARE) 10 MG tablet Take 1 tablet (10 mg total) by mouth daily. 02/23/13   FVernie Shanks MD     ROS: As above in the HPI. All other systems are stable or negative.  OBJECTIVE: APPEARANCE:  Patient in no acute distress.The patient appeared well nourished and normally developed. Acyanotic. Waist: VITAL SIGNS:BP 130/60  Pulse 94  Temp(Src) 98.6 F (37 C) (Oral)  Ht 5' 2"  (1.575 m)  Wt 200 lb 9.6 oz (90.992 kg)  BMI 36.68 kg/m2 WF  SKIN: warm and  Dry without overt rashes, tattoos and scars  HEAD and Neck: without JVD, Head and scalp: normal Eyes:No scleral icterus. Fundi normal, eye movements normal. Ears: Auricle normal, canal normal, Tympanic membranes normal, insufflation normal. Nose: normal Throat: normal Neck & thyroid: normal  CHEST & LUNGS: Chest wall: normal Lungs: Clear  CVS: Reveals the PMI to be normally located. Regular rhythm, First and Second Heart sounds are normal,  absence of murmurs, rubs or gallops. Peripheral vasculature: Radial pulses: normal Dorsal pedis pulses: normal Posterior pulses: normal  ABDOMEN:  Appearance: obese Benign, no organomegaly, no masses, no Abdominal Aortic enlargement. No Guarding , no rebound. No Bruits. Bowel sounds: normal  RECTAL: N/A GU: N/A  EXTREMETIES: nonedematous.  MUSCULOSKELETAL:  Spine: normal Joints: intact  NEUROLOGIC: oriented to time,place and person; nonfocal. No change in the neuropathy. Results for orders placed in  visit on 05/13/13  [facility]     Result Value Ref Range   Glucose 127 (*) 65 - 99 mg/dL   BUN 10  8 - 27 mg/dL   Creatinine, Ser 0.85  0.57 - 1.00 mg/dL   GFR calc non Af Amer 67  >59 mL/min/1.73   GFR calc Af Amer 78  >59 mL/min/1.73   BUN/Creatinine Ratio 12  11 - 26   Sodium 141  134 - 144 mmol/L   Potassium 4.2  3.5 - 5.2 mmol/L   Chloride 101  97 - 108 mmol/L   CO2 22  18 - 29 mmol/L   Calcium 10.8 (*) 8.7 - 10.3 mg/dL     ASSESSMENT:  Peripheral neuropathy - Plan: gabapentin (NEURONTIN) 100 MG capsule  Osteopenia  Obesity  Hypertension  Hyperlipidemia  Diabetes mellitus without complication  COPD (chronic obstructive pulmonary disease)  Hypercalcemia  Arthritis  DM (diabetes mellitus) type II controlled, neurological manifestation - Plan: sitaGLIPtin (JANUVIA) 100 MG tablet Stable  PLAN: Dash diet Diet, exercise, daily foot checks. Annual eye exam Reviewed labs with patient No orders of the defined types were placed in this encounter.   Meds ordered this encounter  Medications  . metFORMIN (GLUCOPHAGE) 1000 MG tablet    Sig: TAKE ONE TABLET TWICE A DAY WITH FOOD    Dispense:  60 tablet    Refill:  5  . sitaGLIPtin (JANUVIA) 100 MG tablet    Sig: Take 1 tablet (100 mg total) by mouth daily.    Dispense:  30 tablet    Refill:  5  . gabapentin (NEURONTIN) 100 MG capsule    Sig: Take 1 capsule (100 mg total) by mouth 3 (three) times daily.    Dispense:  90 capsule    Refill:  5   Medications Discontinued During This Encounter  Medication Reason  . metFORMIN (GLUCOPHAGE) 1000 MG tablet Reorder  . sitaGLIPtin (JANUVIA) 100 MG tablet Reorder  . gabapentin (NEURONTIN) 100 MG capsule Reorder   Return in about 3 months (around 09/09/2013) for Recheck medical problems.  Michaela Moon, M.D.

## 2013-06-10 NOTE — Telephone Encounter (Signed)
That medication combined with gabapentin associated with a higher risk to fall and fractures. Not a good idea.  Bring all medications at next office visit.

## 2013-06-10 NOTE — Telephone Encounter (Signed)
I did not see this on  pt epic med list?

## 2013-06-10 NOTE — Patient Instructions (Signed)
DASH Diet  The DASH diet stands for "Dietary Approaches to Stop Hypertension." It is a healthy eating plan that has been shown to reduce high blood pressure (hypertension) in as little as 14 days, while also possibly providing other significant health benefits. These other health benefits include reducing the risk of breast cancer after menopause and reducing the risk of type 2 diabetes, heart disease, colon cancer, and stroke. Health benefits also include weight loss and slowing kidney failure in patients with chronic kidney disease.   DIET GUIDELINES  · Limit salt (sodium). Your diet should contain less than 1500 mg of sodium daily.  · Limit refined or processed carbohydrates. Your diet should include mostly whole grains. Desserts and added sugars should be used sparingly.  · Include small amounts of heart-healthy fats. These types of fats include nuts, oils, and tub margarine. Limit saturated and trans fats. These fats have been shown to be harmful in the body.  CHOOSING FOODS   The following food groups are based on a 2000 calorie diet. See your Registered Dietitian for individual calorie needs.  Grains and Grain Products (6 to 8 servings daily)  · Eat More Often: Whole-wheat bread, brown rice, whole-grain or wheat pasta, quinoa, popcorn without added fat or salt (air popped).  · Eat Less Often: White bread, white pasta, white rice, cornbread.  Vegetables (4 to 5 servings daily)  · Eat More Often: Fresh, frozen, and canned vegetables. Vegetables may be raw, steamed, roasted, or grilled with a minimal amount of fat.  · Eat Less Often/Avoid: Creamed or fried vegetables. Vegetables in a cheese sauce.  Fruit (4 to 5 servings daily)  · Eat More Often: All fresh, canned (in natural juice), or frozen fruits. Dried fruits without added sugar. One hundred percent fruit juice (½ cup [237 mL] daily).  · Eat Less Often: Dried fruits with added sugar. Canned fruit in light or heavy syrup.  Lean Meats, Fish, and Poultry (2  servings or less daily. One serving is 3 to 4 oz [85-114 g]).  · Eat More Often: Ninety percent or leaner ground beef, tenderloin, sirloin. Round cuts of beef, chicken breast, turkey breast. All fish. Grill, bake, or broil your meat. Nothing should be fried.  · Eat Less Often/Avoid: Fatty cuts of meat, turkey, or chicken leg, thigh, or wing. Fried cuts of meat or fish.  Dairy (2 to 3 servings)  · Eat More Often: Low-fat or fat-free milk, low-fat plain or light yogurt, reduced-fat or part-skim cheese.  · Eat Less Often/Avoid: Milk (whole, 2%). Whole milk yogurt. Full-fat cheeses.  Nuts, Seeds, and Legumes (4 to 5 servings per week)  · Eat More Often: All without added salt.  · Eat Less Often/Avoid: Salted nuts and seeds, canned beans with added salt.  Fats and Sweets (limited)  · Eat More Often: Vegetable oils, tub margarines without trans fats, sugar-free gelatin. Mayonnaise and salad dressings.  · Eat Less Often/Avoid: Coconut oils, palm oils, butter, stick margarine, cream, half and half, cookies, candy, pie.  FOR MORE INFORMATION  The Dash Diet Eating Plan: www.dashdiet.org  Document Released: 01/31/2011 Document Revised: 05/06/2011 Document Reviewed: 01/31/2011  Exit[employer]® Patient Information ©2014 [employer].

## 2013-06-11 NOTE — Telephone Encounter (Signed)
Pt aware of message below from dr Jacelyn Grip

## 2013-07-31 ENCOUNTER — Other Ambulatory Visit: Payer: Self-pay | Admitting: Family Medicine

## 2013-08-06 DIAGNOSIS — I10 Essential (primary) hypertension: Secondary | ICD-10-CM | POA: Diagnosis not present

## 2013-08-06 DIAGNOSIS — E782 Mixed hyperlipidemia: Secondary | ICD-10-CM | POA: Diagnosis not present

## 2013-08-06 DIAGNOSIS — R32 Unspecified urinary incontinence: Secondary | ICD-10-CM | POA: Diagnosis not present

## 2013-08-06 DIAGNOSIS — IMO0001 Reserved for inherently not codable concepts without codable children: Secondary | ICD-10-CM | POA: Diagnosis not present

## 2013-08-06 DIAGNOSIS — E1149 Type 2 diabetes mellitus with other diabetic neurological complication: Secondary | ICD-10-CM | POA: Diagnosis not present

## 2013-08-06 DIAGNOSIS — M159 Polyosteoarthritis, unspecified: Secondary | ICD-10-CM | POA: Diagnosis not present

## 2013-08-24 DIAGNOSIS — Z01419 Encounter for gynecological examination (general) (routine) without abnormal findings: Secondary | ICD-10-CM | POA: Diagnosis not present

## 2013-11-05 DIAGNOSIS — I10 Essential (primary) hypertension: Secondary | ICD-10-CM | POA: Diagnosis not present

## 2013-11-05 DIAGNOSIS — IMO0001 Reserved for inherently not codable concepts without codable children: Secondary | ICD-10-CM | POA: Diagnosis not present

## 2013-11-30 DIAGNOSIS — Z23 Encounter for immunization: Secondary | ICD-10-CM | POA: Diagnosis not present

## 2013-12-03 ENCOUNTER — Other Ambulatory Visit: Payer: Self-pay | Admitting: Family Medicine

## 2013-12-06 NOTE — Telephone Encounter (Signed)
This is okay to refill x1, make an appointment for patient to see a provider

## 2013-12-06 NOTE — Telephone Encounter (Signed)
Last seen 06/10/13  FP[facility]

## 2013-12-13 ENCOUNTER — Other Ambulatory Visit: Payer: Self-pay | Admitting: Family Medicine

## 2013-12-14 ENCOUNTER — Other Ambulatory Visit: Payer: Self-pay | Admitting: Nurse Practitioner

## 2013-12-14 NOTE — Telephone Encounter (Signed)
Last seen 06/10/13 [facility]  Last lipid 04/01/13

## 2013-12-14 NOTE — Telephone Encounter (Signed)
Please give this patient an appointment to be seen so that lab work and liver function test may be done. It is okay to refill these prescriptions times one and 2 she is able to be seen by a provider--- please let her know this

## 2013-12-15 NOTE — Telephone Encounter (Signed)
Last seen 06/10/13 Dr Jacelyn Grip

## 2013-12-15 NOTE — Telephone Encounter (Signed)
This is okay x1, patient needs to make an appointment to be seen.

## 2013-12-27 DIAGNOSIS — Z1231 Encounter for screening mammogram for malignant neoplasm of breast: Secondary | ICD-10-CM | POA: Diagnosis not present

## 2014-01-13 ENCOUNTER — Other Ambulatory Visit: Payer: Self-pay | Admitting: Family Medicine

## 2014-01-27 DIAGNOSIS — J209 Acute bronchitis, unspecified: Secondary | ICD-10-CM | POA: Diagnosis not present

## 2014-02-07 ENCOUNTER — Other Ambulatory Visit: Payer: Self-pay | Admitting: Family Medicine

## 2014-02-11 DIAGNOSIS — E119 Type 2 diabetes mellitus without complications: Secondary | ICD-10-CM | POA: Diagnosis not present

## 2014-02-11 DIAGNOSIS — I10 Essential (primary) hypertension: Secondary | ICD-10-CM | POA: Diagnosis not present

## 2014-03-09 ENCOUNTER — Other Ambulatory Visit: Payer: Self-pay | Admitting: Family Medicine

## 2014-03-29 ENCOUNTER — Other Ambulatory Visit: Payer: Self-pay | Admitting: Nurse Practitioner

## 2014-04-18 ENCOUNTER — Other Ambulatory Visit: Payer: Self-pay | Admitting: Family Medicine

## 2014-04-25 ENCOUNTER — Other Ambulatory Visit: Payer: Self-pay | Admitting: Family Medicine

## 2014-04-28 DIAGNOSIS — E1165 Type 2 diabetes mellitus with hyperglycemia: Secondary | ICD-10-CM | POA: Diagnosis not present

## 2014-04-28 DIAGNOSIS — Z0000 Encounter for general adult medical examination without abnormal findings: Secondary | ICD-10-CM | POA: Diagnosis not present

## 2014-05-13 DIAGNOSIS — J069 Acute upper respiratory infection, unspecified: Secondary | ICD-10-CM | POA: Diagnosis not present

## 2014-06-15 ENCOUNTER — Other Ambulatory Visit: Payer: Self-pay | Admitting: Family Medicine

## 2014-06-27 ENCOUNTER — Other Ambulatory Visit: Payer: Self-pay | Admitting: Family Medicine

## 2014-07-13 DIAGNOSIS — E1165 Type 2 diabetes mellitus with hyperglycemia: Secondary | ICD-10-CM | POA: Diagnosis not present

## 2014-07-13 DIAGNOSIS — M65312 Trigger thumb, left thumb: Secondary | ICD-10-CM | POA: Diagnosis not present

## 2014-07-26 ENCOUNTER — Other Ambulatory Visit: Payer: Self-pay | Admitting: Family Medicine

## 2014-08-10 DIAGNOSIS — H2513 Age-related nuclear cataract, bilateral: Secondary | ICD-10-CM | POA: Diagnosis not present

## 2014-08-10 DIAGNOSIS — H538 Other visual disturbances: Secondary | ICD-10-CM | POA: Diagnosis not present

## 2014-08-15 ENCOUNTER — Other Ambulatory Visit: Payer: Self-pay | Admitting: Family Medicine

## 2014-08-19 DIAGNOSIS — E1129 Type 2 diabetes mellitus with other diabetic kidney complication: Secondary | ICD-10-CM | POA: Diagnosis not present

## 2014-08-19 DIAGNOSIS — I10 Essential (primary) hypertension: Secondary | ICD-10-CM | POA: Diagnosis not present

## 2014-08-24 DIAGNOSIS — I10 Essential (primary) hypertension: Secondary | ICD-10-CM | POA: Diagnosis not present

## 2014-08-24 DIAGNOSIS — E1165 Type 2 diabetes mellitus with hyperglycemia: Secondary | ICD-10-CM | POA: Diagnosis not present

## 2014-09-13 ENCOUNTER — Other Ambulatory Visit: Payer: Self-pay | Admitting: Family Medicine

## 2014-09-28 ENCOUNTER — Other Ambulatory Visit: Payer: Self-pay | Admitting: Family Medicine

## 2014-11-14 ENCOUNTER — Other Ambulatory Visit: Payer: Self-pay | Admitting: Family Medicine

## 2014-11-24 DIAGNOSIS — Z23 Encounter for immunization: Secondary | ICD-10-CM | POA: Diagnosis not present

## 2014-11-28 ENCOUNTER — Other Ambulatory Visit: Payer: Self-pay | Admitting: Family Medicine

## 2014-11-29 DIAGNOSIS — J208 Acute bronchitis due to other specified organisms: Secondary | ICD-10-CM | POA: Diagnosis not present

## 2014-11-29 DIAGNOSIS — I10 Essential (primary) hypertension: Secondary | ICD-10-CM | POA: Diagnosis not present

## 2014-11-29 DIAGNOSIS — E1129 Type 2 diabetes mellitus with other diabetic kidney complication: Secondary | ICD-10-CM | POA: Diagnosis not present

## 2015-01-24 ENCOUNTER — Other Ambulatory Visit: Payer: Self-pay | Admitting: Family Medicine

## 2015-02-14 ENCOUNTER — Other Ambulatory Visit: Payer: Self-pay | Admitting: Family Medicine

## 2015-03-14 DIAGNOSIS — E1129 Type 2 diabetes mellitus with other diabetic kidney complication: Secondary | ICD-10-CM | POA: Diagnosis not present

## 2015-03-14 DIAGNOSIS — I10 Essential (primary) hypertension: Secondary | ICD-10-CM | POA: Diagnosis not present

## 2015-03-27 DIAGNOSIS — M65312 Trigger thumb, left thumb: Secondary | ICD-10-CM | POA: Diagnosis not present

## 2015-06-02 DIAGNOSIS — J40 Bronchitis, not specified as acute or chronic: Secondary | ICD-10-CM | POA: Diagnosis not present

## 2015-06-13 DIAGNOSIS — Z0000 Encounter for general adult medical examination without abnormal findings: Secondary | ICD-10-CM | POA: Diagnosis not present

## 2015-06-13 DIAGNOSIS — R5383 Other fatigue: Secondary | ICD-10-CM | POA: Diagnosis not present

## 2015-06-13 DIAGNOSIS — I10 Essential (primary) hypertension: Secondary | ICD-10-CM | POA: Diagnosis not present

## 2015-06-13 DIAGNOSIS — E1129 Type 2 diabetes mellitus with other diabetic kidney complication: Secondary | ICD-10-CM | POA: Diagnosis not present

## 2015-06-13 DIAGNOSIS — Z1389 Encounter for screening for other disorder: Secondary | ICD-10-CM | POA: Diagnosis not present

## 2015-08-09 DIAGNOSIS — I10 Essential (primary) hypertension: Secondary | ICD-10-CM | POA: Diagnosis not present

## 2015-08-09 DIAGNOSIS — E784 Other hyperlipidemia: Secondary | ICD-10-CM | POA: Diagnosis not present

## 2015-08-09 DIAGNOSIS — E1129 Type 2 diabetes mellitus with other diabetic kidney complication: Secondary | ICD-10-CM | POA: Diagnosis not present

## 2015-08-28 DIAGNOSIS — Z6836 Body mass index (BMI) 36.0-36.9, adult: Secondary | ICD-10-CM | POA: Diagnosis not present

## 2015-08-28 DIAGNOSIS — Z01419 Encounter for gynecological examination (general) (routine) without abnormal findings: Secondary | ICD-10-CM | POA: Diagnosis not present

## 2015-09-12 DIAGNOSIS — E662 Morbid (severe) obesity with alveolar hypoventilation: Secondary | ICD-10-CM | POA: Diagnosis not present

## 2015-09-12 DIAGNOSIS — E1129 Type 2 diabetes mellitus with other diabetic kidney complication: Secondary | ICD-10-CM | POA: Diagnosis not present

## 2015-09-12 DIAGNOSIS — I10 Essential (primary) hypertension: Secondary | ICD-10-CM | POA: Diagnosis not present

## 2015-09-29 DIAGNOSIS — M5431 Sciatica, right side: Secondary | ICD-10-CM | POA: Diagnosis not present

## 2015-12-12 DIAGNOSIS — I10 Essential (primary) hypertension: Secondary | ICD-10-CM | POA: Diagnosis not present

## 2015-12-12 DIAGNOSIS — E662 Morbid (severe) obesity with alveolar hypoventilation: Secondary | ICD-10-CM | POA: Diagnosis not present

## 2015-12-12 DIAGNOSIS — E1129 Type 2 diabetes mellitus with other diabetic kidney complication: Secondary | ICD-10-CM | POA: Diagnosis not present

## 2015-12-12 DIAGNOSIS — J4521 Mild intermittent asthma with (acute) exacerbation: Secondary | ICD-10-CM | POA: Diagnosis not present

## 2015-12-21 DIAGNOSIS — I10 Essential (primary) hypertension: Secondary | ICD-10-CM | POA: Diagnosis not present

## 2015-12-21 DIAGNOSIS — E784 Other hyperlipidemia: Secondary | ICD-10-CM | POA: Diagnosis not present

## 2015-12-21 DIAGNOSIS — E1129 Type 2 diabetes mellitus with other diabetic kidney complication: Secondary | ICD-10-CM | POA: Diagnosis not present

## 2015-12-26 DIAGNOSIS — Z23 Encounter for immunization: Secondary | ICD-10-CM | POA: Diagnosis not present

## 2016-01-29 DIAGNOSIS — E1129 Type 2 diabetes mellitus with other diabetic kidney complication: Secondary | ICD-10-CM | POA: Diagnosis not present

## 2016-01-29 DIAGNOSIS — I10 Essential (primary) hypertension: Secondary | ICD-10-CM | POA: Diagnosis not present

## 2016-01-29 DIAGNOSIS — E784 Other hyperlipidemia: Secondary | ICD-10-CM | POA: Diagnosis not present

## 2016-02-29 DIAGNOSIS — E1129 Type 2 diabetes mellitus with other diabetic kidney complication: Secondary | ICD-10-CM | POA: Diagnosis not present

## 2016-02-29 DIAGNOSIS — I10 Essential (primary) hypertension: Secondary | ICD-10-CM | POA: Diagnosis not present

## 2016-02-29 DIAGNOSIS — E784 Other hyperlipidemia: Secondary | ICD-10-CM | POA: Diagnosis not present

## 2016-03-12 DIAGNOSIS — I208 Other forms of angina pectoris: Secondary | ICD-10-CM | POA: Diagnosis not present

## 2016-03-12 DIAGNOSIS — E1129 Type 2 diabetes mellitus with other diabetic kidney complication: Secondary | ICD-10-CM | POA: Diagnosis not present

## 2016-03-12 DIAGNOSIS — I10 Essential (primary) hypertension: Secondary | ICD-10-CM | POA: Diagnosis not present

## 2016-03-12 DIAGNOSIS — E662 Morbid (severe) obesity with alveolar hypoventilation: Secondary | ICD-10-CM | POA: Diagnosis not present

## 2016-03-12 DIAGNOSIS — J4521 Mild intermittent asthma with (acute) exacerbation: Secondary | ICD-10-CM | POA: Diagnosis not present

## 2016-03-18 DIAGNOSIS — I208 Other forms of angina pectoris: Secondary | ICD-10-CM | POA: Diagnosis not present

## 2016-03-18 DIAGNOSIS — R931 Abnormal findings on diagnostic imaging of heart and coronary circulation: Secondary | ICD-10-CM | POA: Diagnosis not present

## 2016-03-18 DIAGNOSIS — R079 Chest pain, unspecified: Secondary | ICD-10-CM | POA: Diagnosis not present

## 2016-03-26 ENCOUNTER — Encounter: Payer: Self-pay | Admitting: Gastroenterology

## 2016-04-02 DIAGNOSIS — L570 Actinic keratosis: Secondary | ICD-10-CM | POA: Diagnosis not present

## 2016-04-02 DIAGNOSIS — D1800 Hemangioma unspecified site: Secondary | ICD-10-CM | POA: Diagnosis not present

## 2016-04-15 ENCOUNTER — Ambulatory Visit (INDEPENDENT_AMBULATORY_CARE_PROVIDER_SITE_OTHER): Payer: Medicare Other | Admitting: Gastroenterology

## 2016-04-15 ENCOUNTER — Encounter: Payer: Self-pay | Admitting: Gastroenterology

## 2016-04-15 ENCOUNTER — Other Ambulatory Visit: Payer: Self-pay

## 2016-04-15 DIAGNOSIS — R1310 Dysphagia, unspecified: Secondary | ICD-10-CM

## 2016-04-15 DIAGNOSIS — R1319 Other dysphagia: Secondary | ICD-10-CM

## 2016-04-15 DIAGNOSIS — K219 Gastro-esophageal reflux disease without esophagitis: Secondary | ICD-10-CM

## 2016-04-15 MED ORDER — PANTOPRAZOLE SODIUM 40 MG PO TBEC: 40.0000 mg | DELAYED_RELEASE_TABLET | Freq: Every day | ORAL | 3 refills | Status: DC

## 2016-04-15 NOTE — Assessment & Plan Note (Addendum)
81 y/o female with poorly controlled GERD symptoms and solid/liquid esophageal dysphagia. Patient has nonexertional chest discomfort, substernal worse after meals and when laying down. Reports negative stress test. No improvement with over the counter antacids. Did not respond to prilosec (5 days). Suspect GERD with esophageal stricture. Cannot rule out esophageal spasms.   Plan for EGD/ED with Dr. Oneida Alar.  I have discussed the risks, alternatives, benefits with regards to but not limited to the risk of reaction to medication, bleeding, infection, perforation and the patient is agreeable to proceed. Written consent to be obtained.  Start pantoprazole 40mg  daily. RX provided.

## 2016-04-15 NOTE — Progress Notes (Signed)
CC'D TO PCP °

## 2016-04-15 NOTE — Progress Notes (Addendum)
REVIEWED-NO ADDITIONAL RECOMMENDATIONS.  Primary Care Physician:  [name], MD  Primary Gastroenterologist:  Barney Drain, MD   Chief Complaint  Patient presents with  . Dysphagia    HPI:  Michaela Moon is a 81 y.o. female here at the request of PCP for further evaluation of esophageal dysphagia. Patient has also had some central chest pain which she feels is indigestion. She reports recent negative cardiac stress test which her son confirms. She has tried multiple over the counter antacids with no relief. Her son gave her five days of Prilosec which didn't help. Symptoms are during the day, and when lays down at night. Associated with regurgitation. When she eats solid foods it feels like food sits in her chest. Happens with liquids but not with pills. No odynophagia. No n/v. No abdominal pain. BM tends towards constipation. No melena, brbpr. She reports remote PUD. Last TCS 2015, normal. 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      Current Outpatient Prescriptions  Medication Sig Dispense Refill  . albuterol-ipratropium (COMBIVENT) 18-103 MCG/ACT inhaler Inhale 2 puffs into the lungs every 6 (six) hours as needed for wheezing. 14.7 g 2  . amLODipine (NORVASC) 2.5 MG tablet TAKE ONE (1) TABLET EACH DAY 30 tablet 0  . aspirin 81 MG tablet Take 81 mg by mouth daily.    Marland Kitchen atorvastatin (LIPITOR) 80 MG tablet ALTERNATE TAKING 1 TABLET EVERY OTHER DAY WITH 1/2 TABLET EVERY OTHERDAY 22 tablet 0  . gabapentin (NEURONTIN) 100 MG capsule Take 1 capsule (100 mg total) by mouth 3 (three) times daily. (Patient taking differently: Take 400 mg by mouth 3 (three) times daily. ) 90 capsule 5  . glimepiride (AMARYL) 2 MG tablet Take 2 mg by mouth daily with breakfast.    . losartan-hydrochlorothiazide (HYZAAR) 100-12.5 MG per tablet TAKE ONE (1) TABLET EACH DAY 30 tablet 0  . metFORMIN (GLUCOPHAGE) 1000 MG tablet TAKE ONE TABLET TWICE A DAY WITH FOOD 60 tablet 5  .  metoprolol (LOPRESSOR) 50 MG tablet Take 50 mg by mouth 2 (two) times daily.    . naproxen (NAPROSYN) 500 MG tablet TAKE 1 TABLET TWICE PER DAY AS NEEDED FOR PAIN, WITH MEALS 60 tablet 0  . oxybutynin (DITROPAN) 5 MG tablet Take 5 mg by mouth 2 (two) times daily.    . sitaGLIPtin (JANUVIA) 100 MG tablet Take 1 tablet (100 mg total) by mouth daily. 30 tablet 5   No current facility-administered medications for this visit.     Allergies as of 04/15/2016  . (No Known Allergies)    Past Medical History:  Diagnosis Date  . Arthritis   . COPD (chronic obstructive pulmonary disease) ([place])   . Diabetes mellitus without complication ([place])   . Hyperlipidemia   . Hypertension   . Obesity   . Osteopenia     Past Surgical History:  Procedure Laterality Date  . BREAST LUMPECTOMY     no cancer  . colon tumor removed  2010   Dr. Lindalou Hose - colon tumor, not cancer  . COLONOSCOPY  05/2013   Dr. Anthony Sar: normal. (h/o colon polyps)    Family History  Problem Relation Age of Onset  . Melanoma Brother   . Colon cancer Neg Hx     Social History   Social History  . Marital status: Single    Spouse name: N/A  . Number of children: N/A  . Years of education: N/A   Occupational History  . Not  on file.   Social History Main Topics  . Smoking status: Former Smoker    Types: Cigarettes    Quit date: 02/25/2002  . Smokeless tobacco: Never Used  . Alcohol use No  . Drug use: No  . Sexual activity: Not on file   Other Topics Concern  . Not on file   Social History Narrative  . No narrative on file      ROS:  General: Negative for anorexia, weight loss, fever, chills, fatigue, weakness. Eyes: Negative for vision changes.  ENT: Negative for hoarseness, nasal congestion.see hpi CV: Negative for   angina, palpitations, dyspnea on exertion, ++peripheral edema. See hpi.  Respiratory: Negative for dyspnea at rest, dyspnea on exertion, cough, sputum, wheezing.  GI: See history of  present illness. GU:  Negative for dysuria, hematuria, urinary incontinence, urinary frequency, nocturnal urination.  MS: Negative for joint pain, low back pain.  Derm: Negative for rash or itching.  Neuro: Negative for weakness, abnormal sensation, seizure, frequent headaches, memory loss, confusion.  Psych: Negative for anxiety, depression, suicidal ideation, hallucinations.  Endo: Negative for unusual weight change.  Heme: Negative for bruising or bleeding. Allergy: Negative for rash or hives.    Physical Examination:  BP (!) 170/82 (BP Location: Right Arm, Cuff Size: Large)   Pulse 79   Temp 98.3 F (36.8 C) (Oral)   Ht 4\' 9"  (1.448 m)   Wt 220 lb 12.8 oz (100.2 kg)   BMI 47.78 kg/m    General: elderly WF in NAD. Accompanied by her son. Head: Normocephalic, atraumatic.   Eyes: Conjunctiva pink, no icterus. Mouth: Oropharyngeal mucosa moist and pink , no lesions erythema or exudate. Neck: Supple without thyromegaly, masses, or lymphadenopathy.  Lungs: Clear to auscultation bilaterally.  Heart: Regular rate and rhythm, no murmurs rubs or gallops.  Abdomen: Bowel sounds are normal, obese, nontender, no hepatosplenomegaly or masses, no abdominal bruits, no rebound or guarding.  Large R mid abd incisional well healed.  Rectal: not performed Extremities: 2+lower extremity edema. No clubbing or deformities.  Neuro: Alert and oriented x 4 , grossly normal neurologically.  Skin: Warm and dry, no rash or jaundice.   Psych: Alert and cooperative, normal mood and affect.

## 2016-04-15 NOTE — Patient Instructions (Addendum)
1. Upper endoscopy as scheduled. See separate instructions. 2. The morning of your procedure, you may take Norvasc(amlodipine), Hyzaar (losartan-HCTZ), Lopressor (metoprolol). HOLD diabetic and other medications the morning of your procedure.  3. We will obtain copy of your colon surgery from 2010 for our records.  4. Start pantoprazole once daily before breakfast for acid reflux.  RX sent to your pharmacy.

## 2016-04-16 ENCOUNTER — Encounter: Payer: Self-pay | Admitting: Gastroenterology

## 2016-04-19 DIAGNOSIS — E1129 Type 2 diabetes mellitus with other diabetic kidney complication: Secondary | ICD-10-CM | POA: Diagnosis not present

## 2016-04-19 DIAGNOSIS — I10 Essential (primary) hypertension: Secondary | ICD-10-CM | POA: Diagnosis not present

## 2016-04-19 DIAGNOSIS — I208 Other forms of angina pectoris: Secondary | ICD-10-CM | POA: Diagnosis not present

## 2016-04-19 DIAGNOSIS — J4521 Mild intermittent asthma with (acute) exacerbation: Secondary | ICD-10-CM | POA: Diagnosis not present

## 2016-04-23 ENCOUNTER — Encounter: Payer: Self-pay | Admitting: Gastroenterology

## 2016-04-29 ENCOUNTER — Encounter (HOSPITAL_COMMUNITY)
Admission: RE | Disposition: A | Discharge status: Home / Self Care | Payer: Self-pay | Source: Ambulatory Visit | Attending: Gastroenterology

## 2016-04-29 ENCOUNTER — Encounter (HOSPITAL_COMMUNITY): Payer: Self-pay | Admitting: *Deleted

## 2016-04-29 ENCOUNTER — Ambulatory Visit (HOSPITAL_COMMUNITY)
Admission: RE | Admit: 2016-04-29 | Discharge: 2016-04-29 | Disposition: A | Discharge status: Home / Self Care | Payer: Medicare Other | Source: Ambulatory Visit | Attending: Gastroenterology | Admitting: Gastroenterology

## 2016-04-29 DIAGNOSIS — J449 Chronic obstructive pulmonary disease, unspecified: Secondary | ICD-10-CM | POA: Diagnosis not present

## 2016-04-29 DIAGNOSIS — E669 Obesity, unspecified: Secondary | ICD-10-CM | POA: Diagnosis not present

## 2016-04-29 DIAGNOSIS — K2970 Gastritis, unspecified, without bleeding: Secondary | ICD-10-CM | POA: Diagnosis not present

## 2016-04-29 DIAGNOSIS — Z7984 Long term (current) use of oral hypoglycemic drugs: Secondary | ICD-10-CM | POA: Insufficient documentation

## 2016-04-29 DIAGNOSIS — K2960 Other gastritis without bleeding: Secondary | ICD-10-CM

## 2016-04-29 DIAGNOSIS — K319 Disease of stomach and duodenum, unspecified: Secondary | ICD-10-CM | POA: Insufficient documentation

## 2016-04-29 DIAGNOSIS — D649 Anemia, unspecified: Secondary | ICD-10-CM | POA: Diagnosis not present

## 2016-04-29 DIAGNOSIS — I10 Essential (primary) hypertension: Secondary | ICD-10-CM | POA: Insufficient documentation

## 2016-04-29 DIAGNOSIS — R1310 Dysphagia, unspecified: Secondary | ICD-10-CM | POA: Diagnosis not present

## 2016-04-29 DIAGNOSIS — E119 Type 2 diabetes mellitus without complications: Secondary | ICD-10-CM | POA: Diagnosis not present

## 2016-04-29 DIAGNOSIS — Z7982 Long term (current) use of aspirin: Secondary | ICD-10-CM | POA: Diagnosis not present

## 2016-04-29 DIAGNOSIS — K219 Gastro-esophageal reflux disease without esophagitis: Secondary | ICD-10-CM | POA: Insufficient documentation

## 2016-04-29 DIAGNOSIS — K3189 Other diseases of stomach and duodenum: Secondary | ICD-10-CM | POA: Diagnosis not present

## 2016-04-29 DIAGNOSIS — M8580 Other specified disorders of bone density and structure, unspecified site: Secondary | ICD-10-CM | POA: Diagnosis not present

## 2016-04-29 DIAGNOSIS — Z6841 Body Mass Index (BMI) 40.0 and over, adult: Secondary | ICD-10-CM | POA: Diagnosis not present

## 2016-04-29 DIAGNOSIS — Z87891 Personal history of nicotine dependence: Secondary | ICD-10-CM | POA: Insufficient documentation

## 2016-04-29 DIAGNOSIS — E785 Hyperlipidemia, unspecified: Secondary | ICD-10-CM | POA: Insufficient documentation

## 2016-04-29 HISTORY — PX: SAVORY DILATION: SHX5439

## 2016-04-29 HISTORY — PX: ESOPHAGOGASTRODUODENOSCOPY: SHX5428

## 2016-04-29 LAB — GLUCOSE, CAPILLARY: Glucose-Capillary: 177 mg/dL — ABNORMAL HIGH (ref 65–99)

## 2016-04-29 SURGERY — EGD (ESOPHAGOGASTRODUODENOSCOPY)
Anesthesia: Moderate Sedation

## 2016-04-29 MED ORDER — MEPERIDINE HCL 100 MG/ML IJ SOLN
INTRAMUSCULAR | Status: DC | PRN
  Administered 2016-04-29: 50 mg via INTRAVENOUS

## 2016-04-29 MED ORDER — LIDOCAINE VISCOUS 2 % MT SOLN
OROMUCOSAL | Status: DC | PRN
  Administered 2016-04-29: 1 via OROMUCOSAL

## 2016-04-29 MED ORDER — STERILE WATER FOR IRRIGATION IR SOLN
Status: DC | PRN
  Administered 2016-04-29: 10:00:00

## 2016-04-29 MED ORDER — MINERAL OIL PO OIL
TOPICAL_OIL | ORAL | Status: AC
  Filled 2016-04-29: qty 30

## 2016-04-29 MED ORDER — MEPERIDINE HCL 100 MG/ML IJ SOLN
INTRAMUSCULAR | Status: DC
Start: 2016-04-29 — End: 2016-04-29
  Filled 2016-04-29: qty 2

## 2016-04-29 MED ORDER — MIDAZOLAM HCL 5 MG/5ML IJ SOLN
INTRAMUSCULAR | Status: AC
  Filled 2016-04-29: qty 10

## 2016-04-29 MED ORDER — LIDOCAINE VISCOUS 2 % MT SOLN
OROMUCOSAL | Status: AC
  Filled 2016-04-29: qty 15

## 2016-04-29 MED ORDER — SODIUM CHLORIDE 0.9 % IV SOLN: INTRAVENOUS | Status: DC

## 2016-04-29 MED ORDER — MIDAZOLAM HCL 5 MG/5ML IJ SOLN
INTRAMUSCULAR | Status: DC | PRN
  Administered 2016-04-29: 1 mg via INTRAVENOUS
  Administered 2016-04-29: 2 mg via INTRAVENOUS

## 2016-04-29 NOTE — H&P (Addendum)
Primary Care Physician:  [name], MD Primary Gastroenterologist:  Dr. Oneida Alar  Pre-Procedure History & Physical: HPI:  Michaela Moon is a 81 y.o. female here for ANEMIA.  Past Medical History:  Diagnosis Date  . Arthritis   . COPD (chronic obstructive pulmonary disease) ([place])   . Diabetes mellitus without complication ([place])   . Hyperlipidemia   . Hypertension   . Obesity   . Osteopenia     Past Surgical History:  Procedure Laterality Date  . BREAST LUMPECTOMY     no cancer  . colon tumor removed  2010   Dr. 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 COLONOSCOPY  05/2013   Dr. Anthony Sar: normal. (h/o colon polyps)  . COLONOSCOPY  2011   normal  . COLONOSCOPY  10/2003   Dr. Lindalou Hose: large polypoid mass hepatic fluexure    Prior to Admission medications   Medication Sig Start Date End Date Taking? Authorizing Provider  albuterol-ipratropium (COMBIVENT) 18-103 MCG/ACT inhaler Inhale 2 puffs into the lungs every 6 (six) hours as needed for wheezing. 11/27/12  Yes Vernie Shanks, MD  amLODipine (NORVASC) 2.5 MG tablet TAKE ONE (1) TABLET EACH DAY 01/13/14  Yes Chipper Herb, MD  aspirin 81 MG tablet Take 81 mg by mouth daily.   Yes Historical Provider, MD  atorvastatin (LIPITOR) 80 MG tablet ALTERNATE TAKING 1 TABLET EVERY OTHER DAY WITH 1/2 TABLET EVERY OTHERDAY 01/13/14  Yes Chipper Herb, MD  Biotin 5 MG TABS Take 1 tablet by mouth daily.   Yes Historical Provider, MD  cholecalciferol (VITAMIN D) 1000 units tablet Take 1,000 Units by mouth daily.   Yes Historical Provider, MD  gabapentin (NEURONTIN) 100 MG capsule Take 1 capsule (100 mg total) by mouth 3 (three) times daily. Patient taking differently: Take 400 mg by mouth 3 (three) times daily.  06/10/13  Yes Vernie Shanks, MD  glimepiride (AMARYL) 2 MG tablet Take 2 mg by mouth daily with breakfast.   Yes Historical Provider, MD   losartan-hydrochlorothiazide (HYZAAR) 100-12.5 MG per tablet TAKE ONE (1) TABLET EACH DAY 03/09/14  Yes Chipper Herb, MD  metFORMIN (GLUCOPHAGE) 1000 MG tablet TAKE ONE TABLET TWICE A DAY WITH FOOD 06/10/13  Yes Vernie Shanks, MD  metoprolol (LOPRESSOR) 50 MG tablet Take 50 mg by mouth 2 (two) times daily.   Yes Historical Provider, MD  naproxen (NAPROSYN) 500 MG tablet TAKE 1 TABLET TWICE PER DAY AS NEEDED FOR PAIN, WITH MEALS 12/15/13  Yes Chipper Herb, MD  Omega-3 Fatty Acids (FISH OIL) 1000 MG CAPS Take 1 capsule by mouth 3 (three) times daily.   Yes Historical Provider, MD  oxybutynin (DITROPAN) 5 MG tablet Take 5 mg by mouth 2 (two) times daily.   Yes Historical Provider, MD  pantoprazole (PROTONIX) 40 MG tablet Take 1 tablet (40 mg total) by mouth daily before breakfast. 04/15/16  Yes Mahala Menghini, PA-C  sitaGLIPtin (JANUVIA) 100 MG tablet Take 1 tablet (100 mg total) by mouth daily. 06/10/13  Yes Vernie Shanks, MD    Allergies as of 04/15/2016  . (No Known Allergies)    Family History  Problem Relation Age of Onset  . Melanoma Brother   . Colon cancer Neg Hx     Social History   Social History  . Marital status: Single    Spouse name: N/A  . Number of children: N/A  . Years of education:  N/A   Occupational History  . Not on file.   Social History Main Topics  . Smoking status: Former Smoker    Packs/day: 2.00    Years: 20.00    Types: Cigarettes    Quit date: 02/25/2002  . Smokeless tobacco: Never Used  . Alcohol use No  . Drug use: No  . Sexual activity: Not on file   Other Topics Concern  . Not on file   Social History Narrative  . No narrative on file    Review of Systems: See HPI, otherwise negative ROS   Physical Exam: BP (!) 169/69   Pulse 70   Temp 98 F (36.7 C) (Oral)   Resp 16   SpO2 93%  General:   Alert,  pleasant and cooperative in NAD Head:  Normocephalic and atraumatic. Neck:  Supple; Lungs:  Clear throughout to auscultation.     Heart:  Regular rate and rhythm. Abdomen:  Soft, nontender and nondistended. Normal bowel sounds, without guarding, and without rebound.   Neurologic:  Alert and  oriented x4;  grossly normal neurologically.  Impression/Plan:     DYSPHAGIA/dyspepsia  PLAN:  EGD/DIL TODAY. DISCUSSED PROCEDURE, BENEFITS, & RISKS: < 1% chance of medication reaction, bleeding, or perforation.

## 2016-04-29 NOTE — Op Note (Signed)
[facility] Patient Name: Michaela Moon Procedure Date: 04/29/2016 9:27 AM MRN: [id number] Date of Birth: Jul 01, 1935 Attending MD: Barney Drain , MD CSN: [id number] Age: 81 Admit Type: Outpatient Procedure:                Upper GI endoscopy WITH COLD FORCEPS                            BIOPSY/ESOPHAGEAL DILATION Indications:              Dyspepsia, Dysphagia, Gastro-esophageal reflux                            disease, UNCPNTROLLED-Failure to respond to medical                            treatment-OMEPRAZOLE. NOW ON PROTONIX ONCE DAILY.                            BMI 47. Providers:                Barney Drain, MD, Hinton Rao, RN, Aram Candela Referring MD:             Stoney Bang MD, MD Medicines:                Meperidine 50 mg IV, Midazolam 3 mg IV Complications:            No immediate complications. Estimated Blood Loss:     Estimated blood loss was minimal. Procedure:                Pre-Anesthesia Assessment:                           - Prior to the procedure, a History and Physical                            was performed, and patient medications and                            allergies were reviewed. The patient's tolerance of                            previous anesthesia was also reviewed. The risks                            and benefits of the procedure and the sedation                            options and risks were discussed with the patient.                            All questions were answered, and informed consent                            was obtained. Prior Anticoagulants: The patient has  taken aspirin, last dose was 1 day prior to                            procedure. ASA Grade Assessment: III - A patient                            with severe systemic disease. After reviewing the                            risks and benefits, the patient was deemed in                            satisfactory condition to undergo the procedure.                            After obtaining informed consent, the endoscope was                            passed under direct vision. Throughout the                            procedure, the patient's blood pressure, pulse, and                            oxygen saturations were monitored continuously. The                            [id number]) scope was introduced through the                            mouth, and advanced to the second part of duodenum.                            The upper GI endoscopy was accomplished without                            difficulty. The patient tolerated the procedure                            well. Scope In: 10:01:35 AM Scope Out: 10:12:40 AM Total Procedure Duration: 0 hours 11 minutes 5 seconds  Findings:      No endoscopic abnormality was evident in the esophagus to explain the       patient's complaint of dysphagia. It was decided, however, to proceed       with dilation of the entire esophagus DUE TO POSSIBLE [ROXIMAL       ESOPHAGEAL WEB. A guidewire was placed and the scope was withdrawn.       Dilation was performed with a Savary dilator with mild resistance at 14       mm, 15 mm, 16 mm and 17 mm.      Diffuse moderate inflammation characterized by congestion (edema),       erosions and erythema was found in the entire examined stomach. Biopsies       were taken with a  cold forceps for Helicobacter pylori testing.      The examined duodenum was normal. Impression:               - No endoscopic esophageal abnormality to explain                            patient's dysphagia. Esophagus dilated. Dilated.                           - MODERATE Gastritis. Moderate Sedation:      Moderate (conscious) sedation was administered by the endoscopy nurse       and supervised by the endoscopist. The following parameters were       monitored: oxygen saturation, heart rate, blood pressure, and response       to care. Total physician intraservice time was  20 minutes. Recommendation:           - Diabetic (ADA) diet and low fat diet.                           - Continue present medications.                           - Await pathology results.                           - Return to my office in 3 months.                           - Patient has a contact number available for                            emergencies. The signs and symptoms of potential                            delayed complications were discussed with the                            patient. Return to normal activities tomorrow.                            Written discharge instructions were provided to the                            patient. Procedure Code(s):        --- Professional ---                           [phone], Esophagogastroduodenoscopy, flexible,                            transoral; with insertion of guide wire followed by                            passage of dilator(s) through esophagus over guide  wire                           [id number], Esophagogastroduodenoscopy, flexible,                            transoral; with biopsy, single or multiple                           [id number], Moderate sedation services provided by the                            same physician or other qualified health care                            professional performing the diagnostic or                            therapeutic service that the sedation supports,                            requiring the presence of an independent trained                            observer to assist in the monitoring of the                            patient's level of consciousness and physiological                            status; initial 15 minutes of intraservice time,                            patient age 19 years or older Diagnosis Code(s):        --- Professional ---                           R13.10, Dysphagia, unspecified                           K29.70, Gastritis, unspecified,  without bleeding                           R10.13, Epigastric pain                           K21.9, Gastro-esophageal reflux disease without                            esophagitis CPT copyright 2016 American Medical Association. All rights reserved. The codes documented in this report are preliminary and upon coder review may  be revised to meet current compliance requirements. Barney Drain, MD Barney Drain, MD 04/29/2016 10:28:16 AM This report has been signed electronically. Number of Addenda: 0

## 2016-05-02 ENCOUNTER — Encounter (HOSPITAL_COMMUNITY): Payer: Self-pay | Admitting: Gastroenterology

## 2016-05-13 ENCOUNTER — Telehealth: Payer: Self-pay

## 2016-05-13 NOTE — Telephone Encounter (Signed)
[name], Annabelle Harman, called for path report from EGD done on 04/29/2016. I told him Dr. Oneida Alar has been on vacation and I will let her know he has called.

## 2016-05-13 NOTE — Discharge Instructions (Signed)
I dilated your esophagus. I DID NOT SEE A DEFINITE NARROWING IN YOUR esophagus. You have EROSIVE gastritis MOST LIKEY DUE TO ASPIRIN. YOU HAVE A SMALL AVM AND DIVERTICULUM IN YOUR SMALL BOWEL.. I biopsied your stomach.   CONTINUE YOUR WEIGHT LOSS EFFORTS. LOSE 30 LBS.  DRINK WATER TO KEEP YOUR URINE LIGHT YELLOW.  FOLLOW A LOW FAT DIET. MEATS SHOULD BE BAKED, BROILED, OR BOILED. Avoid fried foods.  CONTINUE PROTONIX. TAKE 30 MINUTES PRIOR TO BREAKFAST.  YOUR BIOPSY RESULTS WILL BE AVAILABLE IN MY CHART AFTER MAR 8 AND MY OFFICE WILL CONTACT YOU IN 10-14 DAYS WITH YOUR RESULTS.   FOLLOW UP IN 3 MOS.    UPPER ENDOSCOPY AFTER CARE Read the instructions outlined below and refer to this sheet in the next week. These discharge instructions provide you with general information on caring for yourself after you leave the hospital. While your treatment has been planned according to the most current medical practices available, unavoidable complications occasionally occur. If you have any problems or questions after discharge, call DR. Kunal Levario, [phone].  ACTIVITY  You may resume your regular activity, but move at a slower pace for the next 24 hours.   Take frequent rest periods for the next 24 hours.   Walking will help get rid of the air and reduce the bloated feeling in your belly (abdomen).   No driving for 24 hours (because of the medicine (anesthesia) used during the test).   You may shower.   Do not sign any important legal documents or operate any machinery for 24 hours (because of the anesthesia used during the test).    NUTRITION  Drink plenty of fluids.   You may resume your normal diet as instructed by your doctor.   Begin with a light meal and progress to your normal diet. Heavy or fried foods are harder to digest and may make you feel sick to your stomach (nauseated).   Avoid alcoholic beverages for 24 hours or as instructed.    MEDICATIONS  You may resume your  normal medications.   WHAT YOU CAN EXPECT TODAY  Some feelings of bloating in the abdomen.   Passage of more gas than usual.    IF YOU HAD A BIOPSY TAKEN DURING THE UPPER ENDOSCOPY:  Eat a soft diet IF YOU HAVE NAUSEA, BLOATING, ABDOMINAL PAIN, OR VOMITING.    FINDING OUT THE RESULTS OF YOUR TEST Not all test results are available during your visit. DR. Oneida Alar WILL CALL YOU WITHIN 14 DAYS OF YOUR PROCEDUE WITH YOUR RESULTS. Do not assume everything is normal if you have not heard from DR. Anthony Tamburo, CALL HER OFFICE AT [phone].  SEEK IMMEDIATE MEDICAL ATTENTION AND CALL THE OFFICE: [phone] IF:  You have more than a spotting of blood in your stool.   Your belly is swollen (abdominal distention).   You are nauseated or vomiting.   You have a temperature over 101F.   You have abdominal pain or discomfort that is severe or gets worse throughout the day.  Gastritis  Gastritis is an inflammation (the body's way of reacting to injury and/or infection) of the stomach. It is often caused by viral or bacterial (germ) infections. It can also be caused BY ASPIRIN, BC/GOODY POWDER'S, (IBUPROFEN) MOTRIN, OR ALEVE (NAPROXEN), chemicals (including alcohol), SPICY FOODS, and medications. This illness may be associated with generalized malaise (feeling tired, not well), UPPER ABDOMINAL STOMACH cramps, and fever. One common bacterial cause of gastritis is an organism known as H. Pylori.  This can be treated with antibiotics.    DYSPHAGIA DYSPHAGIA can be caused by stomach acid backing up into the tube that carries food from the mouth down to the stomach (lower esophagus).  TREATMENT There are a number of medicines used to treat DYSPHAGIA including: Antacids.  Proton-pump inhibitors: PROTONIX ZANTAC OR PEPCID HOME CARE INSTRUCTIONS Eat 2-3 hours before going to bed.  Try to reach and maintain a healthy weight.  Do not eat just a few very large meals. Instead, eat 4 TO 6 smaller meals  throughout the day.  Try to identify foods and beverages that make your symptoms worse, and avoid these.  Avoid tight clothing.  Do not exercise right after eating.  Lifestyle and home remedies TO HELP CONTROL HEARTBURN.  You may eliminate or reduce the frequency of heartburn by making the following lifestyle changes:   Control your weight. Being overweight is a major risk factor for heartburn and GERD. Excess pounds put pressure on your abdomen, pushing up your stomach and causing acid to back up into your esophagus.    Eat smaller meals. 4 TO 6 MEALS A DAY. This reduces pressure on the lower esophageal sphincter, helping to prevent the valve from opening and acid from washing back into your esophagus.    Loosen your belt. Clothes that fit tightly around your waist put pressure on your abdomen and the lower esophageal sphincter.     Eliminate heartburn triggers. Everyone has specific triggers. Common triggers such as fatty or fried foods, spicy food, tomato sauce, carbonated beverages, alcohol, chocolate, mint, garlic, onion, caffeine and nicotine may make heartburn worse.    Avoid stooping or bending. Tying your shoes is OK. Bending over for longer periods to weed your garden isn't, especially soon after eating.    Don't lie down after a meal. Wait at least three to four hours after eating before going to bed, and don't lie down right after eating.    PLACE THE HEAD OF YOUR BED ON 6 INCH BLOCKS.  Alternative medicine  Several home remedies exist for treating GERD, but they provide only temporary relief. They include drinking baking soda (sodium bicarbonate) added to water or drinking other fluids such as baking soda mixed with cream of tartar and water.  Although these liquids create temporary relief by neutralizing, washing away or buffering acids, eventually they aggravate the situation by adding gas and fluid to your stomach, increasing pressure and causing more acid reflux.  Further, adding more sodium to your diet may increase your blood pressure and add stress to your heart, and excessive bicarbonate ingestion can alter the acid-base balance in your body.

## 2016-05-13 NOTE — Telephone Encounter (Signed)
Please call pt. HER stomach Bx shows mild gastritis DUE TO ASA AND NAPROXEN.   CONTINUE YOUR WEIGHT LOSS EFFORTS. LOSE 30 LBS.  DRINK WATER TO KEEP YOUR URINE LIGHT YELLOW.  FOLLOW A LOW FAT DIET. MEATS SHOULD BE BAKED, BROILED, OR BOILED. Avoid fried foods.  CONTINUE PROTONIX. TAKE 30 MINUTES PRIOR TO BREAKFAST.  PT SHOULD HAVE HFP DONE WITHIN THE NEXT 7-14 DAYS. SHE HAS HAD ABNORMAL LIVER ENZYMES IN THE PAST.  FOLLOW UP IN 3 MOS E30 DYSPHAGIA/GERD.

## 2016-05-14 ENCOUNTER — Other Ambulatory Visit: Payer: Self-pay

## 2016-05-14 ENCOUNTER — Telehealth: Payer: Self-pay | Admitting: Gastroenterology

## 2016-05-14 DIAGNOSIS — R945 Abnormal results of liver function studies: Principal | ICD-10-CM

## 2016-05-14 DIAGNOSIS — R7989 Other specified abnormal findings of blood chemistry: Secondary | ICD-10-CM

## 2016-05-14 NOTE — Telephone Encounter (Signed)
I called and explained to [name], [name]. He said when we get the results to make sure we forward to Dr. Angelique Holm.

## 2016-05-14 NOTE — Telephone Encounter (Signed)
REVIEWED-NO ADDITIONAL RECOMMENDATIONS. 

## 2016-05-14 NOTE — Telephone Encounter (Signed)
Please call Dr Grace Blight office. They had a question about the patient's lab work. [phone]

## 2016-05-14 NOTE — Telephone Encounter (Signed)
OV made °

## 2016-05-14 NOTE — Telephone Encounter (Signed)
Pt is aware and lab order on file for pt to go to [place].

## 2016-05-14 NOTE — Telephone Encounter (Signed)
I returned the call and spoke to [place]. She said the pt's son called and asked them about his mom's liver enzymes. She said they checked them in July and they were normal. I told her Dr. Oneida Alar just said they had been abnormal in the past and she just wanted to check them within the next couple of weeks. She said she thought maybe the family just misunderstood.

## 2016-05-17 DIAGNOSIS — R7989 Other specified abnormal findings of blood chemistry: Secondary | ICD-10-CM | POA: Diagnosis not present

## 2016-05-17 LAB — HEPATIC FUNCTION PANEL
ALBUMIN: 4.3 g/dL (ref 3.6–5.1)
ALK PHOS: 41 U/L (ref 33–130)
ALT: 29 U/L (ref 6–29)
AST: 29 U/L (ref 10–35)
Bilirubin, Direct: 0.1 mg/dL (ref <=0.2)
Indirect Bilirubin: 0.2 mg/dL (ref 0.2–1.2)
Total Bilirubin: 0.3 mg/dL (ref 0.2–1.2)
Total Protein: 7.6 g/dL (ref 6.1–8.1)

## 2016-06-03 NOTE — Telephone Encounter (Signed)
PLEASE CALL PT. HER LIVER PANEL IS NORMAL. 

## 2016-06-03 NOTE — Telephone Encounter (Signed)
[name] is aware.

## 2016-06-03 NOTE — Telephone Encounter (Signed)
cc'ed to pcp °

## 2016-06-10 DIAGNOSIS — E662 Morbid (severe) obesity with alveolar hypoventilation: Secondary | ICD-10-CM | POA: Diagnosis not present

## 2016-06-10 DIAGNOSIS — K219 Gastro-esophageal reflux disease without esophagitis: Secondary | ICD-10-CM | POA: Diagnosis not present

## 2016-06-10 DIAGNOSIS — I208 Other forms of angina pectoris: Secondary | ICD-10-CM | POA: Diagnosis not present

## 2016-06-10 DIAGNOSIS — G5602 Carpal tunnel syndrome, left upper limb: Secondary | ICD-10-CM | POA: Diagnosis not present

## 2016-06-10 DIAGNOSIS — J4521 Mild intermittent asthma with (acute) exacerbation: Secondary | ICD-10-CM | POA: Diagnosis not present

## 2016-06-10 DIAGNOSIS — N3941 Urge incontinence: Secondary | ICD-10-CM | POA: Diagnosis not present

## 2016-06-10 DIAGNOSIS — I10 Essential (primary) hypertension: Secondary | ICD-10-CM | POA: Diagnosis not present

## 2016-06-10 DIAGNOSIS — E1129 Type 2 diabetes mellitus with other diabetic kidney complication: Secondary | ICD-10-CM | POA: Diagnosis not present

## 2016-06-14 ENCOUNTER — Telehealth: Payer: Self-pay | Admitting: Gastroenterology

## 2016-06-14 NOTE — Telephone Encounter (Signed)
PLEASE CALL [name]. I REVIEWED THE PATHOLOGY AGAIN. THERE IS NO NEED TO CHECK HER FOR H PYLORI.

## 2016-06-14 NOTE — Telephone Encounter (Signed)
[name], Annabelle Harman, called and asked if his mom was checked for H Pylori when she had EGD. I told him, yes and the report was negative for H Pylori. He said pt's PCP was going to check her for H Pylori and he requested that I fax this report to him.  I am faxing to Dr. Sherrie Sport .

## 2016-06-14 NOTE — Telephone Encounter (Signed)
[phone]  Please call patient son, he has some questions about her tests

## 2016-06-17 NOTE — Telephone Encounter (Signed)
Left message for pt's son, [name], to call.

## 2016-06-18 NOTE — Telephone Encounter (Signed)
LATE ENTRY: [name] returned call yesterday and was informed.

## 2016-06-23 DIAGNOSIS — E7800 Pure hypercholesterolemia, unspecified: Secondary | ICD-10-CM | POA: Diagnosis not present

## 2016-06-23 DIAGNOSIS — Z87891 Personal history of nicotine dependence: Secondary | ICD-10-CM | POA: Diagnosis not present

## 2016-06-23 DIAGNOSIS — Z7984 Long term (current) use of oral hypoglycemic drugs: Secondary | ICD-10-CM | POA: Diagnosis not present

## 2016-06-23 DIAGNOSIS — M79651 Pain in right thigh: Secondary | ICD-10-CM | POA: Diagnosis not present

## 2016-06-23 DIAGNOSIS — M25561 Pain in right knee: Secondary | ICD-10-CM | POA: Diagnosis not present

## 2016-06-23 DIAGNOSIS — E119 Type 2 diabetes mellitus without complications: Secondary | ICD-10-CM | POA: Diagnosis not present

## 2016-06-23 DIAGNOSIS — Z79899 Other long term (current) drug therapy: Secondary | ICD-10-CM | POA: Diagnosis not present

## 2016-06-23 DIAGNOSIS — Z7982 Long term (current) use of aspirin: Secondary | ICD-10-CM | POA: Diagnosis not present

## 2016-06-23 DIAGNOSIS — I10 Essential (primary) hypertension: Secondary | ICD-10-CM | POA: Diagnosis not present

## 2016-06-24 DIAGNOSIS — M79661 Pain in right lower leg: Secondary | ICD-10-CM | POA: Diagnosis not present

## 2016-06-24 DIAGNOSIS — M7989 Other specified soft tissue disorders: Secondary | ICD-10-CM | POA: Diagnosis not present

## 2016-06-24 DIAGNOSIS — R600 Localized edema: Secondary | ICD-10-CM | POA: Diagnosis not present

## 2016-06-24 DIAGNOSIS — M79604 Pain in right leg: Secondary | ICD-10-CM | POA: Diagnosis not present

## 2016-07-05 DIAGNOSIS — E662 Morbid (severe) obesity with alveolar hypoventilation: Secondary | ICD-10-CM | POA: Diagnosis not present

## 2016-07-05 DIAGNOSIS — M1711 Unilateral primary osteoarthritis, right knee: Secondary | ICD-10-CM | POA: Diagnosis not present

## 2016-07-10 DIAGNOSIS — I10 Essential (primary) hypertension: Secondary | ICD-10-CM | POA: Diagnosis not present

## 2016-07-10 DIAGNOSIS — E784 Other hyperlipidemia: Secondary | ICD-10-CM | POA: Diagnosis not present

## 2016-07-10 DIAGNOSIS — E1129 Type 2 diabetes mellitus with other diabetic kidney complication: Secondary | ICD-10-CM | POA: Diagnosis not present

## 2016-07-18 ENCOUNTER — Encounter: Payer: Self-pay | Admitting: Gastroenterology

## 2016-07-23 DIAGNOSIS — M7041 Prepatellar bursitis, right knee: Secondary | ICD-10-CM | POA: Diagnosis not present

## 2016-07-29 DIAGNOSIS — E1129 Type 2 diabetes mellitus with other diabetic kidney complication: Secondary | ICD-10-CM | POA: Diagnosis not present

## 2016-07-29 DIAGNOSIS — E784 Other hyperlipidemia: Secondary | ICD-10-CM | POA: Diagnosis not present

## 2016-07-29 DIAGNOSIS — I10 Essential (primary) hypertension: Secondary | ICD-10-CM | POA: Diagnosis not present

## 2016-07-30 ENCOUNTER — Ambulatory Visit: Payer: Medicare Other | Admitting: Gastroenterology

## 2016-08-08 DIAGNOSIS — Z7984 Long term (current) use of oral hypoglycemic drugs: Secondary | ICD-10-CM | POA: Diagnosis not present

## 2016-08-08 DIAGNOSIS — E119 Type 2 diabetes mellitus without complications: Secondary | ICD-10-CM | POA: Diagnosis not present

## 2016-08-08 DIAGNOSIS — H25813 Combined forms of age-related cataract, bilateral: Secondary | ICD-10-CM | POA: Diagnosis not present

## 2016-08-08 DIAGNOSIS — H04123 Dry eye syndrome of bilateral lacrimal glands: Secondary | ICD-10-CM | POA: Diagnosis not present

## 2016-08-16 ENCOUNTER — Encounter: Payer: Self-pay | Admitting: Gastroenterology

## 2016-08-16 ENCOUNTER — Ambulatory Visit (INDEPENDENT_AMBULATORY_CARE_PROVIDER_SITE_OTHER): Payer: Medicare Other | Admitting: Gastroenterology

## 2016-08-16 VITALS — BP 151/63 | HR 64 | Temp 98.1°F | Ht 61.0 in | Wt 218.0 lb

## 2016-08-16 DIAGNOSIS — K2970 Gastritis, unspecified, without bleeding: Secondary | ICD-10-CM | POA: Diagnosis not present

## 2016-08-16 DIAGNOSIS — K219 Gastro-esophageal reflux disease without esophagitis: Secondary | ICD-10-CM | POA: Diagnosis not present

## 2016-08-16 DIAGNOSIS — K2990 Gastroduodenitis, unspecified, without bleeding: Secondary | ICD-10-CM | POA: Diagnosis not present

## 2016-08-16 DIAGNOSIS — K2960 Other gastritis without bleeding: Secondary | ICD-10-CM

## 2016-08-16 NOTE — Progress Notes (Signed)
cc'd to pcp 

## 2016-08-16 NOTE — Progress Notes (Signed)
Primary Care Physician: [name], MD  Primary Gastroenterologist:  Barney Drain, MD   Chief Complaint  Patient presents with  . Dysphagia    HPI: Michaela Moon is a 81 y.o. female here for follow-up. She has a history of esophageal dysphagia.She underwent EGD in March showing normal esophagus. Empirically dilated due to possibility of proximal esophageal with. She had moderate gastritis, reactive gastropathy on biopsy. No H pylori. She has been on pantoprazole 40 mg daily since February. She is doing very well. She stopped her naproxen. Denies abdominal pain except for fleeting sharp pain that lasts a few seconds at a time the left abdomen occurs only occasionally. Unrelated to bowel movements. Bowel movements are regular. 3 times a day. No melena rectal bleeding. Dysphagia resolved.   Current Outpatient Prescriptions  Medication Sig Dispense Refill  . albuterol-ipratropium (COMBIVENT) 18-103 MCG/ACT inhaler Inhale 2 puffs into the lungs every 6 (six) hours as needed for wheezing. 14.7 g 2  . amLODipine (NORVASC) 2.5 MG tablet TAKE ONE (1) TABLET EACH DAY 30 tablet 0  . aspirin 81 MG tablet Take 81 mg by mouth daily.    Marland Kitchen atorvastatin (LIPITOR) 80 MG tablet ALTERNATE TAKING 1 TABLET EVERY OTHER DAY WITH 1/2 TABLET EVERY OTHERDAY 22 tablet 0  . Biotin 5 MG TABS Take 1 tablet by mouth daily.    . cholecalciferol (VITAMIN D) 1000 units tablet Take 1,000 Units by mouth daily.    Marland Kitchen gabapentin (NEURONTIN) 100 MG capsule Take 1 capsule (100 mg total) by mouth 3 (three) times daily. (Patient taking differently: Take 400 mg by mouth 3 (three) times daily. ) 90 capsule 5  . glimepiride (AMARYL) 2 MG tablet Take 2 mg by mouth daily with breakfast.    . losartan-hydrochlorothiazide (HYZAAR) 100-12.5 MG per tablet TAKE ONE (1) TABLET EACH DAY 30 tablet 0  . metFORMIN (GLUCOPHAGE) 1000 MG tablet TAKE ONE TABLET TWICE A DAY WITH FOOD 60 tablet 5  . metoprolol (LOPRESSOR) 50 MG tablet  Take 50 mg by mouth 2 (two) times daily.    . Omega-3 Fatty Acids (FISH OIL) 1000 MG CAPS Take 1 capsule by mouth 3 (three) times daily.    Marland Kitchen oxybutynin (DITROPAN) 5 MG tablet Take 5 mg by mouth 2 (two) times daily.    . pantoprazole (PROTONIX) 40 MG tablet Take 1 tablet (40 mg total) by mouth daily before breakfast. 30 tablet 3  . sitaGLIPtin (JANUVIA) 100 MG tablet Take 1 tablet (100 mg total) by mouth daily. 30 tablet 5   No current facility-administered medications for this visit.     Allergies as of 08/16/2016  . (No Known Allergies)    ROS:  General: Negative for anorexia, weight loss, fever, chills, fatigue, weakness. ENT: Negative for hoarseness, difficulty swallowing , nasal congestion. CV: Negative for chest pain, angina, palpitations, dyspnea on exertion, peripheral edema.  Respiratory: Negative for dyspnea at rest, dyspnea on exertion, cough, sputum, wheezing.  GI: See history of present illness. GU:  Negative for dysuria, hematuria, urinary incontinence, urinary frequency, nocturnal urination.  Endo: Negative for unusual weight change.    Physical Examination:   BP (!) 151/63   Pulse 64   Temp 98.1 F (36.7 C) (Oral)   Ht 5\' 1"  (1.549 m)   Wt 218 lb (98.9 kg)   BMI 41.19 kg/m   General: Well-nourished, well-developed in no acute distress.  Eyes: No icterus. Mouth: Oropharyngeal mucosa moist and pink , no lesions erythema or  exudate. Lungs: Clear to auscultation bilaterally.  Heart: Regular rate and rhythm, no murmurs rubs or gallops.  Abdomen: Bowel sounds are normal, nontender, nondistended, no hepatosplenomegaly or masses, no abdominal bruits or hernia , no rebound or guarding.   Extremities: No lower extremity edema. No clubbing or deformities. Neuro: Alert and oriented x 4   Skin: Warm and dry, no jaundice.   Psych: Alert and cooperative, normal mood and affect.  Labs:  Lab Results  Component Value Date   ALT 29 05/17/2016   AST 29 05/17/2016    ALKPHOS 41 05/17/2016   BILITOT 0.3 05/17/2016     Imaging Studies: No results found.

## 2016-08-16 NOTE — Patient Instructions (Signed)
1. Continue pantoprazole 40 mg daily. 2. We will see you back in one year, call sooner if needed.

## 2016-08-16 NOTE — Assessment & Plan Note (Signed)
81 year old female recently underwent EGD for dysphagia. At time of EGD she was noted to have reactive gastropathy. Had recently been started on pantoprazole 40 mg daily just prior to the EGD. No esophageal abnormalities. Empirically dilated for history of dysphagia. No H pylori on biopsies. Next  Doing very well today. Dysphagia resolved. No abdominal pain. No concerns.  Return to the office in one year or sooner if needed. She will continue pantoprazole 40 mg daily.

## 2016-09-12 DIAGNOSIS — M7041 Prepatellar bursitis, right knee: Secondary | ICD-10-CM | POA: Diagnosis not present

## 2016-09-12 DIAGNOSIS — E1165 Type 2 diabetes mellitus with hyperglycemia: Secondary | ICD-10-CM | POA: Diagnosis not present

## 2016-09-12 DIAGNOSIS — E1129 Type 2 diabetes mellitus with other diabetic kidney complication: Secondary | ICD-10-CM | POA: Diagnosis not present

## 2016-09-12 DIAGNOSIS — E784 Other hyperlipidemia: Secondary | ICD-10-CM | POA: Diagnosis not present

## 2016-10-03 DIAGNOSIS — H52223 Regular astigmatism, bilateral: Secondary | ICD-10-CM | POA: Diagnosis not present

## 2016-10-03 DIAGNOSIS — H25813 Combined forms of age-related cataract, bilateral: Secondary | ICD-10-CM | POA: Diagnosis not present

## 2016-10-21 NOTE — Patient Instructions (Signed)
Your procedure is scheduled on: 11/01/2016  Report to [facility] at  1100   AM.  Call this number if you have problems the morning of surgery: [phone]   Do not eat food or drink liquids :After Midnight.      Take these medicines the morning of surgery with A SIP OF WATER: amlodipine, neurontin, losartan, metoprolol, protonix. Use your inhaler before you come and bring your rescue inhaler with you if you habe one. DO NOT take any medications for diabetes the morning of your surgery.   Do not wear jewelry, make-up or nail polish.  Do not wear lotions, powders, or perfumes. You may wear deodorant.  Do not shave 48 hours prior to surgery.  Do not bring valuables to the hospital.  Contacts, dentures or bridgework may not be worn into surgery.  Leave suitcase in the car. After surgery it may be brought to your room.  For patients admitted to the hospital, checkout time is 11:00 AM the day of discharge.   Patients discharged the day of surgery will not be allowed to drive home.  :     Please read over the following fact sheets that you were given: Coughing and Deep Breathing, Surgical Site Infection Prevention, Anesthesia Post-op Instructions and Care and Recovery After Surgery    Cataract A cataract is a clouding of the lens of the eye. When a lens becomes cloudy, vision is reduced based on the degree and nature of the clouding. Many cataracts reduce vision to some degree. Some cataracts make people more near-sighted as they develop. Other cataracts increase glare. Cataracts that are ignored and become worse can sometimes look white. The white color can be seen through the pupil. CAUSES   Aging. However, cataracts may occur at any age, even in newborns.   Certain drugs.   Trauma to the eye.   Certain diseases such as diabetes.   Specific eye diseases such as chronic inflammation inside the eye or a sudden attack of a rare form of glaucoma.   Inherited or acquired medical problems.    SYMPTOMS   Gradual, progressive drop in vision in the affected eye.   Severe, rapid visual loss. This most often happens when trauma is the cause.  DIAGNOSIS  To detect a cataract, an eye doctor examines the lens. Cataracts are best diagnosed with an exam of the eyes with the pupils enlarged (dilated) by drops.  TREATMENT  For an early cataract, vision may improve by using different eyeglasses or stronger lighting. If that does not help your vision, surgery is the only effective treatment. A cataract needs to be surgically removed when vision loss interferes with your everyday activities, such as driving, reading, or watching TV. A cataract may also have to be removed if it prevents examination or treatment of another eye problem. Surgery removes the cloudy lens and usually replaces it with a substitute lens (intraocular lens, IOL).  At a time when both you and your doctor agree, the cataract will be surgically removed. If you have cataracts in both eyes, only one is usually removed at a time. This allows the operated eye to heal and be out of danger from any possible problems after surgery (such as infection or poor wound healing). In rare cases, a cataract may be doing damage to your eye. In these cases, your caregiver may advise surgical removal right away. The vast majority of people who have cataract surgery have better vision afterward. HOME CARE INSTRUCTIONS  If you  are not planning surgery, you may be asked to do the following:  Use different eyeglasses.   Use stronger or brighter lighting.   Ask your eye doctor about reducing your medicine dose or changing medicines if it is thought that a medicine caused your cataract. Changing medicines does not make the cataract go away on its own.   Become familiar with your surroundings. Poor vision can lead to injury. Avoid bumping into things on the affected side. You are at a higher risk for tripping or falling.   Exercise extreme care when  driving or operating machinery.   Wear sunglasses if you are sensitive to bright light or experiencing problems with glare.  SEEK IMMEDIATE MEDICAL CARE IF:   You have a worsening or sudden vision loss.   You notice redness, swelling, or increasing pain in the eye.   You have a fever.  Document Released: 02/11/2005 Document Revised: 01/31/2011 Document Reviewed: 10/05/2010 [facility] Patient Information 2012 [place].PATIENT INSTRUCTIONS POST-ANESTHESIA  IMMEDIATELY FOLLOWING SURGERY:  Do not drive or operate machinery for the first twenty four hours after surgery.  Do not make any important decisions for twenty four hours after surgery or while taking narcotic pain medications or sedatives.  If you develop intractable nausea and vomiting or a severe headache please notify your doctor immediately.  FOLLOW-UP:  Please make an appointment with your surgeon as instructed. You do not need to follow up with anesthesia unless specifically instructed to do so.  WOUND CARE INSTRUCTIONS (if applicable):  Keep a dry clean dressing on the anesthesia/puncture wound site if there is drainage.  Once the wound has quit draining you may leave it open to air.  Generally you should leave the bandage intact for twenty four hours unless there is drainage.  If the epidural site drains for more than 36-48 hours please call the anesthesia department.  QUESTIONS?:  Please feel free to call your physician or the hospital operator if you have any questions, and they will be happy to assist you.

## 2016-10-25 ENCOUNTER — Encounter (HOSPITAL_COMMUNITY): Payer: Self-pay

## 2016-10-25 ENCOUNTER — Other Ambulatory Visit: Payer: Self-pay

## 2016-10-25 ENCOUNTER — Encounter (HOSPITAL_COMMUNITY)
Admission: RE | Admit: 2016-10-25 | Discharge: 2016-10-25 | Disposition: A | Discharge status: Home / Self Care | Payer: Medicare Other | Source: Ambulatory Visit | Attending: Ophthalmology | Admitting: Ophthalmology

## 2016-10-25 DIAGNOSIS — I10 Essential (primary) hypertension: Secondary | ICD-10-CM | POA: Diagnosis not present

## 2016-10-25 DIAGNOSIS — E785 Hyperlipidemia, unspecified: Secondary | ICD-10-CM | POA: Insufficient documentation

## 2016-10-25 DIAGNOSIS — E669 Obesity, unspecified: Secondary | ICD-10-CM | POA: Insufficient documentation

## 2016-10-25 DIAGNOSIS — K2960 Other gastritis without bleeding: Secondary | ICD-10-CM | POA: Diagnosis not present

## 2016-10-25 DIAGNOSIS — J449 Chronic obstructive pulmonary disease, unspecified: Secondary | ICD-10-CM | POA: Diagnosis not present

## 2016-10-25 DIAGNOSIS — M1990 Unspecified osteoarthritis, unspecified site: Secondary | ICD-10-CM | POA: Diagnosis not present

## 2016-10-25 DIAGNOSIS — Z01812 Encounter for preprocedural laboratory examination: Secondary | ICD-10-CM | POA: Insufficient documentation

## 2016-10-25 DIAGNOSIS — Z01810 Encounter for preprocedural cardiovascular examination: Secondary | ICD-10-CM | POA: Insufficient documentation

## 2016-10-25 DIAGNOSIS — K219 Gastro-esophageal reflux disease without esophagitis: Secondary | ICD-10-CM | POA: Diagnosis not present

## 2016-10-25 DIAGNOSIS — H2512 Age-related nuclear cataract, left eye: Secondary | ICD-10-CM | POA: Diagnosis not present

## 2016-10-25 DIAGNOSIS — E1140 Type 2 diabetes mellitus with diabetic neuropathy, unspecified: Secondary | ICD-10-CM | POA: Insufficient documentation

## 2016-10-25 DIAGNOSIS — H25812 Combined forms of age-related cataract, left eye: Secondary | ICD-10-CM | POA: Diagnosis not present

## 2016-10-25 DIAGNOSIS — M8580 Other specified disorders of bone density and structure, unspecified site: Secondary | ICD-10-CM | POA: Insufficient documentation

## 2016-10-25 DIAGNOSIS — R1310 Dysphagia, unspecified: Secondary | ICD-10-CM | POA: Insufficient documentation

## 2016-10-25 HISTORY — DX: Headache, unspecified: R51.9

## 2016-10-25 HISTORY — DX: Unspecified asthma, uncomplicated: J45.909

## 2016-10-25 HISTORY — DX: Headache: R51

## 2016-10-25 HISTORY — DX: Polyneuropathy, unspecified: G62.9

## 2016-10-25 HISTORY — DX: Gastro-esophageal reflux disease without esophagitis: K21.9

## 2016-10-25 LAB — BASIC METABOLIC PANEL
ANION GAP: 9 (ref 5–15)
BUN: 14 mg/dL (ref 6–20)
CHLORIDE: 102 mmol/L (ref 101–111)
CO2: 26 mmol/L (ref 22–32)
Calcium: 10.5 mg/dL — ABNORMAL HIGH (ref 8.9–10.3)
Creatinine, Ser: 0.7 mg/dL (ref 0.44–1.00)
GFR calc non Af Amer: >60 mL/min (ref >60)
Glucose, Bld: 161 mg/dL — ABNORMAL HIGH (ref 65–99)
POTASSIUM: 3.9 mmol/L (ref 3.5–5.1)
SODIUM: 137 mmol/L (ref 135–145)

## 2016-10-25 LAB — CBC
HEMATOCRIT: 37.7 % (ref 36.0–46.0)
HEMOGLOBIN: 12.5 g/dL (ref 12.0–15.0)
MCH: 29.8 pg (ref 26.0–34.0)
MCHC: 33.2 g/dL (ref 30.0–36.0)
MCV: 89.8 fL (ref 78.0–100.0)
Platelets: 148 10*3/uL — ABNORMAL LOW (ref 150–400)
RBC: 4.2 MIL/uL (ref 3.87–5.11)
RDW: 13.6 % (ref 11.5–15.5)
WBC: 7.5 10*3/uL (ref 4.0–10.5)

## 2016-11-01 ENCOUNTER — Ambulatory Visit (HOSPITAL_COMMUNITY): Payer: Medicare Other | Admitting: Anesthesiology

## 2016-11-01 ENCOUNTER — Ambulatory Visit (HOSPITAL_COMMUNITY)
Admission: RE | Admit: 2016-11-01 | Discharge: 2016-11-01 | Disposition: A | Discharge status: Home / Self Care | Payer: Medicare Other | Source: Ambulatory Visit | Attending: Ophthalmology | Admitting: Ophthalmology

## 2016-11-01 ENCOUNTER — Encounter (HOSPITAL_COMMUNITY)
Admission: RE | Disposition: A | Discharge status: Home / Self Care | Payer: Self-pay | Source: Ambulatory Visit | Attending: Ophthalmology

## 2016-11-01 ENCOUNTER — Encounter (HOSPITAL_COMMUNITY): Payer: Self-pay | Admitting: *Deleted

## 2016-11-01 DIAGNOSIS — E119 Type 2 diabetes mellitus without complications: Secondary | ICD-10-CM | POA: Insufficient documentation

## 2016-11-01 DIAGNOSIS — H25812 Combined forms of age-related cataract, left eye: Secondary | ICD-10-CM | POA: Diagnosis not present

## 2016-11-01 DIAGNOSIS — K219 Gastro-esophageal reflux disease without esophagitis: Secondary | ICD-10-CM | POA: Diagnosis not present

## 2016-11-01 DIAGNOSIS — J449 Chronic obstructive pulmonary disease, unspecified: Secondary | ICD-10-CM | POA: Insufficient documentation

## 2016-11-01 DIAGNOSIS — H2512 Age-related nuclear cataract, left eye: Secondary | ICD-10-CM | POA: Diagnosis not present

## 2016-11-01 DIAGNOSIS — Z794 Long term (current) use of insulin: Secondary | ICD-10-CM | POA: Diagnosis not present

## 2016-11-01 DIAGNOSIS — Z87891 Personal history of nicotine dependence: Secondary | ICD-10-CM | POA: Insufficient documentation

## 2016-11-01 DIAGNOSIS — H269 Unspecified cataract: Secondary | ICD-10-CM | POA: Diagnosis not present

## 2016-11-01 DIAGNOSIS — I10 Essential (primary) hypertension: Secondary | ICD-10-CM | POA: Diagnosis not present

## 2016-11-01 HISTORY — PX: CATARACT EXTRACTION W/PHACO: SHX586

## 2016-11-01 LAB — GLUCOSE, CAPILLARY: Glucose-Capillary: 184 mg/dL — ABNORMAL HIGH (ref 65–99)

## 2016-11-01 SURGERY — PHACOEMULSIFICATION, CATARACT, WITH IOL INSERTION
Anesthesia: Monitor Anesthesia Care | Site: Eye | Laterality: Left

## 2016-11-01 MED ORDER — FENTANYL CITRATE (PF) 100 MCG/2ML IJ SOLN
INTRAMUSCULAR | Status: AC
  Filled 2016-11-01: qty 2

## 2016-11-01 MED ORDER — SODIUM HYALURONATE 23 MG/ML IO SOLN
INTRAOCULAR | Status: DC | PRN
  Administered 2016-11-01: 0.6 mL via INTRAOCULAR

## 2016-11-01 MED ORDER — TETRACAINE HCL 0.5 % OP SOLN
1.0000 [drp] | OPHTHALMIC | Status: AC
  Administered 2016-11-01 (×3): 1 [drp] via OPHTHALMIC

## 2016-11-01 MED ORDER — POVIDONE-IODINE 5 % OP SOLN
OPHTHALMIC | Status: DC | PRN
  Administered 2016-11-01: 1 via OPHTHALMIC

## 2016-11-01 MED ORDER — NEOMYCIN-POLYMYXIN-DEXAMETH 3.5-10000-0.1 OP SUSP
OPHTHALMIC | Status: DC | PRN
  Administered 2016-11-01: 2 [drp] via OPHTHALMIC

## 2016-11-01 MED ORDER — FENTANYL CITRATE (PF) 100 MCG/2ML IJ SOLN
25.0000 ug | Freq: Once | INTRAMUSCULAR | Status: AC
  Administered 2016-11-01: 25 ug via INTRAVENOUS

## 2016-11-01 MED ORDER — PROVISC 10 MG/ML IO SOLN
INTRAOCULAR | Status: DC | PRN
  Administered 2016-11-01: 0.85 mL via INTRAOCULAR

## 2016-11-01 MED ORDER — BSS IO SOLN
INTRAOCULAR | Status: DC | PRN
  Administered 2016-11-01: 15 mL

## 2016-11-01 MED ORDER — MIDAZOLAM HCL 2 MG/2ML IJ SOLN
1.0000 mg | INTRAMUSCULAR | Status: AC
  Administered 2016-11-01: 2 mg via INTRAVENOUS

## 2016-11-01 MED ORDER — MIDAZOLAM HCL 5 MG/5ML IJ SOLN
INTRAMUSCULAR | Status: DC | PRN
  Administered 2016-11-01: 1 mg via INTRAVENOUS

## 2016-11-01 MED ORDER — BSS IO SOLN
INTRAOCULAR | Status: DC | PRN
  Administered 2016-11-01: 500 mL

## 2016-11-01 MED ORDER — LIDOCAINE HCL (PF) 1 % IJ SOLN
INTRAOCULAR | Status: DC | PRN
  Administered 2016-11-01: .9 mL via OPHTHALMIC

## 2016-11-01 MED ORDER — LIDOCAINE HCL 3.5 % OP GEL
1.0000 "application " | Freq: Once | OPHTHALMIC | Status: AC
  Administered 2016-11-01: 1 via OPHTHALMIC

## 2016-11-01 MED ORDER — LACTATED RINGERS IV SOLN
INTRAVENOUS | Status: DC
  Administered 2016-11-01: 08:00:00 via INTRAVENOUS

## 2016-11-01 MED ORDER — MIDAZOLAM HCL 2 MG/2ML IJ SOLN
INTRAMUSCULAR | Status: AC
  Filled 2016-11-01: qty 2

## 2016-11-01 MED ORDER — CYCLOPENTOLATE-PHENYLEPHRINE 0.2-1 % OP SOLN
1.0000 [drp] | OPHTHALMIC | Status: AC
  Administered 2016-11-01 (×3): 1 [drp] via OPHTHALMIC

## 2016-11-01 MED ORDER — PHENYLEPHRINE HCL 2.5 % OP SOLN
1.0000 [drp] | OPHTHALMIC | Status: AC
  Administered 2016-11-01 (×3): 1 [drp] via OPHTHALMIC

## 2016-11-01 SURGICAL SUPPLY — 12 items
CLOTH BEACON ORANGE TIMEOUT ST (SAFETY) ×3 IMPLANT
EYE SHIELD UNIVERSAL CLEAR (GAUZE/BANDAGES/DRESSINGS) ×3 IMPLANT
GLOVE BIOGEL PI IND STRL 6.5 (GLOVE) ×2 IMPLANT
GLOVE BIOGEL PI INDICATOR 6.5 (GLOVE) ×4
LENS ALC ACRYL/TECN (Ophthalmic Related) ×3 IMPLANT
NEEDLE HYPO 18GX1.5 BLUNT FILL (NEEDLE) ×3 IMPLANT
PAD ARMBOARD 7.5X6 YLW CONV (MISCELLANEOUS) ×3 IMPLANT
SYR TB 1ML LL NO SAFETY (SYRINGE) ×3 IMPLANT
TAPE SURG TRANSPORE 1 IN (GAUZE/BANDAGES/DRESSINGS) ×1 IMPLANT
TAPE SURGICAL TRANSPORE 1 IN (GAUZE/BANDAGES/DRESSINGS) ×2
VISCOELASTIC ADDITIONAL (OPHTHALMIC RELATED) ×3 IMPLANT
WATER STERILE IRR 250ML POUR (IV SOLUTION) ×3 IMPLANT

## 2016-11-01 NOTE — Discharge Instructions (Signed)
PATIENT INSTRUCTIONS POST-ANESTHESIA  IMMEDIATELY FOLLOWING SURGERY:  Do not drive or operate machinery for the first twenty four hours after surgery.  Do not make any important decisions for twenty four hours after surgery or while taking narcotic pain medications or sedatives.  If you develop intractable nausea and vomiting or a severe headache please notify your doctor immediately.  FOLLOW-UP:  Please make an appointment with your surgeon as instructed. You do not need to follow up with anesthesia unless specifically instructed to do so.  WOUND CARE INSTRUCTIONS (if applicable):  Keep a dry clean dressing on the anesthesia/puncture wound site if there is drainage.  Once the wound has quit draining you may leave it open to air.  Generally you should leave the bandage intact for twenty four hours unless there is drainage.  If the epidural site drains for more than 36-48 hours please call the anesthesia department.  QUESTIONS?:  Please feel free to call your physician or the hospital operator if you have any questions, and they will be happy to assist you.      Please discharge patient when stable, will follow up today with Dr. Marisa Hua at the [facility] office at 11:35AM.  Leave shield in place until visit.  All paperwork with discharge instructions will be given at the office.

## 2016-11-01 NOTE — H&P (Signed)
The H and P was reviewed and updated. The patient was examined.  No changes were found after exam.  The surgical eye was marked.  

## 2016-11-01 NOTE — Op Note (Signed)
Date of procedure: 11/01/16  Pre-operative diagnosis: Visually significant cataract, Left Eye  Post-operative diagnosis: Visually significant cataract, Left Eye  Procedure: Removal of cataract via phacoemulsification and insertion of intra-ocular lens AMO PCB00  +23.5D into the capsular bag of the Left Eye  Attending surgeon: Gerda Diss. Adalis Gatti, MD, MA  Anesthesia: MAC, Topical Akten  Complications: None  Estimated Blood Loss: <59m (minimal)  Specimens: None  Implants: As above  Indications:  Visually significant cataract, Left Eye  Procedure:  The patient was seen and identified in the pre-operative area. The operative eye was identified and dilated.  The operative eye was marked.  Topical anesthesia was administered to the operative eye.     The patient was then to the operative suite and placed in the supine position.  A timeout was performed confirming the patient, procedure to be performed, and all other relevant information.   The patient's face was prepped and draped in the usual fashion for intra-ocular surgery.  A lid speculum was placed into the operative eye and the surgical microscope moved into place and focused.  A superotemporal paracentesis was created using a 20 gauge paracentesis blade.  Shugarcaine was injected into the anterior chamber.  Viscoelastic was injected into the anterior chamber.  A temporal clear-corneal main wound incision was created using a 2.477mmicrokeratome.  A continuous curvilinear capsulorrhexis was initiated using an irrigating cystitome and completed using capsulorrhexis forceps.  Hydrodissection and hydrodeliniation were performed.  Viscoelastic was injected into the anterior chamber.  A phacoemulsification handpiece and a chopper as a second instrument were used to remove the nucleus and epinucleus. The irrigation/aspiration handpiece was used to remove any remaining cortical material.   The capsular bag was reinflated with viscoelastic, checked,  and found to be intact.  The intraocular lens was inserted into the capsular bag and dialed into place using a Kuglen hook.  The irrigation/aspiration handpiece was used to remove any remaining viscoelastic.  The clear corneal wound and paracentesis wounds were then hydrated and checked with Weck-Cels to be watertight.  The lid-speculum and drape was removed, and the patient's face was cleaned with a wet and dry 4x4.  Maxitrol was instilled in the eye before a clear shield was taped over the eye. The patient was taken to the post-operative care unit in good condition, having tolerated the procedure well.  Post-Op Instructions: [facility] and will receive all other orders and instructions.

## 2016-11-01 NOTE — Anesthesia Preprocedure Evaluation (Signed)
Anesthesia Evaluation  Patient identified by MRN, date of birth, ID band Patient awake    Reviewed: Allergy & Precautions, NPO status , Patient's Chart, lab work & pertinent test results  Airway Mallampati: II  TM Distance: >3 FB     Dental  (+) Edentulous Upper, Edentulous Lower   Pulmonary asthma , COPD, former smoker,    breath sounds clear to auscultation       Cardiovascular hypertension,  Rhythm:Regular Rate:Normal     Neuro/Psych  Headaches,  Neuromuscular disease    GI/Hepatic PUD, GERD  ,  Endo/Other  diabetes, Type 2, Oral Hypoglycemic Agents  Renal/GU      Musculoskeletal  (+) Arthritis ,   Abdominal   Peds  Hematology   Anesthesia Other Findings   Reproductive/Obstetrics                             Anesthesia Physical Anesthesia Plan  ASA: III  Anesthesia Plan: MAC   Post-op Pain Management:    Induction: Intravenous  PONV Risk Score and Plan:   Airway Management Planned: Nasal Cannula  Additional Equipment:   Intra-op Plan:   Post-operative Plan:   Informed Consent: I have reviewed the patients History and Physical, chart, labs and discussed the procedure including the risks, benefits and alternatives for the proposed anesthesia with the patient or authorized representative who has indicated his/her understanding and acceptance.     Plan Discussed with:   Anesthesia Plan Comments:         Anesthesia Quick Evaluation

## 2016-11-01 NOTE — Transfer of Care (Signed)
Immediate Anesthesia Transfer of Care Note  Patient: Michaela Moon  Procedure(s) Performed: Procedure(s) with comments: CATARACT EXTRACTION PHACO AND INTRAOCULAR LENS PLACEMENT (IOC) (Left) - CDE: 3.83  Patient Location: PACU  Anesthesia Type:MAC  Level of Consciousness: awake, alert  and oriented  Airway & Oxygen Therapy: Patient Spontanous Breathing  Post-op Assessment: Report given to RN  Post vital signs: Reviewed and stable  Last Vitals:  Vitals:   11/01/16 0805 11/01/16 0810  BP: (!) 135/45 (!) 139/55  Pulse:    Resp: 11 (!) 24  Temp:    SpO2: 93% 93%    Last Pain:  Vitals:   11/01/16 0730  TempSrc: Oral      Patients Stated Pain Goal: 7 (24/40/10 2725)  Complications: No apparent anesthesia complications

## 2016-11-01 NOTE — Anesthesia Postprocedure Evaluation (Signed)
Anesthesia Post Note  Patient: Michaela Moon  Procedure(s) Performed: Procedure(s) (LRB): CATARACT EXTRACTION PHACO AND INTRAOCULAR LENS PLACEMENT (IOC) (Left)  Patient location during evaluation: Short Stay Anesthesia Type: MAC Level of consciousness: awake and alert and oriented Pain management: pain level controlled Vital Signs Assessment: post-procedure vital signs reviewed and stable Respiratory status: spontaneous breathing Cardiovascular status: blood pressure returned to baseline Postop Assessment: no signs of nausea or vomiting Anesthetic complications: no     Last Vitals:  Vitals:   11/01/16 0805 11/01/16 0810  BP: (!) 135/45 (!) 139/55  Pulse:    Resp: 11 (!) 24  Temp:    SpO2: 93% 93%    Last Pain:  Vitals:   11/01/16 0730  TempSrc: Oral                 Massimiliano Rohleder

## 2016-11-04 ENCOUNTER — Encounter (HOSPITAL_COMMUNITY): Payer: Self-pay | Admitting: Ophthalmology

## 2016-11-19 DIAGNOSIS — E784 Other hyperlipidemia: Secondary | ICD-10-CM | POA: Diagnosis not present

## 2016-11-19 DIAGNOSIS — E1129 Type 2 diabetes mellitus with other diabetic kidney complication: Secondary | ICD-10-CM | POA: Diagnosis not present

## 2016-11-19 DIAGNOSIS — I10 Essential (primary) hypertension: Secondary | ICD-10-CM | POA: Diagnosis not present

## 2016-11-22 DIAGNOSIS — H25811 Combined forms of age-related cataract, right eye: Secondary | ICD-10-CM | POA: Diagnosis not present

## 2016-11-25 NOTE — Patient Instructions (Signed)
Michaela Moon  11/25/2016     @PREFPERIOPPHARMACY @   Your procedure is scheduled on 11/29/2016.  Report to [facility] at 9:15 A.M.  Call this number if you have problems the morning of surgery:  [phone]   Remember:  Do not eat food or drink liquids after midnight.  Take these medicines the morning of surgery with A SIP OF WATER Norvasc, Neurontin, Hyzaar, Lopressor and Zantac   Do not wear jewelry, make-up or nail polish.  Do not wear lotions, powders, or perfumes, or deoderant.  Do not shave 48 hours prior to surgery.  Men may shave face and neck.  Do not bring valuables to the hospital.  [facility] is not responsible for any belongings or valuables.  Contacts, dentures or bridgework may not be worn into surgery.  Leave your suitcase in the car.  After surgery it may be brought to your room.  For patients admitted to the hospital, discharge time will be determined by your treatment team.  Patients discharged the day of surgery will not be allowed to drive home.   Name and phone number of your driver:   family Special instructions: N/APlease read over the following fact sheets that you were given. Care and Recovery After Surgery    Cataract A cataract is cloudiness on the lens of your eye. The lens is the clear part of your eye that is behind your iris and pupil. The lens focuses light on the retina, which lets you see clearly. When a lens becomes cloudy, vision may become blurry. The clouding can range from a tiny dot to complete cloudiness. As some cataracts develop, they make a person more nearsighted. Other cataracts increase glare. Cataracts can worsen over time, and sometimes the pupil can look white. Cataracts get bigger and they cloud more of the lens, making it difficult to see. Cataracts can affect one eye or both eyes. What are the causes? Most cataracts are associated with age-related eye changes. The eye lens is mostly made up of water and protein. Normally,  this protein is arranged in a way that keeps the lens clear. Cataracts develop when protein begins to clump together over time. This clouds the lens and lets less light pass through to the retina, which causes blurry vision. What increases the risk? This condition is more likely to develop in people who:  Are 27 years of age or older.  Have diabetes.  Have high blood pressure.  Takecertain medicines, such as steroids or hormone replacement therapy.  Have had an eye injury.  Have or have had eye inflammation.  Have a family history of cataracts.  Smoke.  Drink alcohol heavily.  Are frequently exposed to sun or very strong light without eye protection.  Are obese.  Have been exposed to large amounts of radiation, lead, or other toxic substances.  Have had eye surgery.  What are the signs or symptoms? The main symptom of a cataract is blurry vision. Your vision may change or get worse over time. Other symptoms include:  Increased glare.  Seeing a bright ring or halo around light.  Poor night vision.  Double vision in one eye.  Having trouble seeing, even while wearing contact lenses or glasses.  Seeing colors that appear faded.  Trouble telling the difference between blue and purple.  Needing frequent changes to your prescription glasses or contacts.  How is this diagnosed? This condition is diagnosed with a medical history and eye exam. You may need to see  an eye specialist (optometrist or ophthalmologist). Your health care provider may enlarge (dilate) your pupils with eye drops to see the back of your eye more clearly and look for signs of cataracts or other damage. You may also have tests, including:  A visual acuity test. This uses a chart to determine the smallest letters that you can see from a specific distance.  A slit-lamp exam. This uses a microscope to examine small sections of your eye for abnormalities.  Tonometry. This test measures the pressure  of the fluid inside your eye.  How is this treated? Treatment depends on the stage of your cataract. For an early cataract, vision may improve by using different eyeglasses or stronger lighting. If that does not help your vision, surgery may be recommended to remove the cataract. If your health care provider thinks your cataract may be linked to any medicines that you are taking, he or she may change your medicines. Follow these instructions at home: Lifestyle  Use stronger or brighter lighting.  Consider using a magnifying glass for reading or other activities.  Become familiar with your surroundings. Having poor vision can put you at a greater risk for tripping, falling, or bumping into things.  Wear sunglasses and a hat if you are sensitive to bright light or are having problems with glare.  Quit smoking if you smoke. If you need help quitting, talk with your health care provider. General instructions  If you are prescribed new eyeglasses, wear them as told by your health care provider.  Take over-the-counter and prescription medicines only as told by your health care provider. Do not change your medicines unless told by your health care provider.  Do not drive or operate heavy machinery if your vision is blurry, particularly at night.  Keep your blood sugar under control, if you have diabetes.  Keep all follow-up visits as told by your health care provider. This is important. Contact a health care provider if:  Your symptoms get worse.  Your vision affects your ability to perform daily activities.  You have new symptoms.  You have a fever. Get help right away if:  You have sudden vision loss.  You have redness, swelling, or increasing pain in your eye.  You develop a headache and sensitivity to light. This information is not intended to replace advice given to you by your health care provider. Make sure you discuss any questions you have with your health care  provider. Document Released: 02/11/2005 Document Revised: 06/22/2015 Document Reviewed: 08/17/2014 Elsevier Interactive Patient Education  2017 [employer].

## 2016-11-26 ENCOUNTER — Encounter (HOSPITAL_COMMUNITY)
Admission: RE | Admit: 2016-11-26 | Discharge: 2016-11-26 | Disposition: A | Discharge status: Home / Self Care | Payer: Medicare Other | Source: Ambulatory Visit | Attending: Ophthalmology | Admitting: Ophthalmology

## 2016-11-29 ENCOUNTER — Ambulatory Visit (HOSPITAL_COMMUNITY): Payer: Medicare Other | Admitting: Anesthesiology

## 2016-11-29 ENCOUNTER — Encounter (HOSPITAL_COMMUNITY)
Admission: RE | Disposition: A | Discharge status: Home / Self Care | Payer: Self-pay | Source: Ambulatory Visit | Attending: Ophthalmology

## 2016-11-29 ENCOUNTER — Encounter (HOSPITAL_COMMUNITY): Payer: Self-pay | Admitting: *Deleted

## 2016-11-29 ENCOUNTER — Ambulatory Visit (HOSPITAL_COMMUNITY)
Admission: RE | Admit: 2016-11-29 | Discharge: 2016-11-29 | Disposition: A | Discharge status: Home / Self Care | Payer: Medicare Other | Source: Ambulatory Visit | Attending: Ophthalmology | Admitting: Ophthalmology

## 2016-11-29 DIAGNOSIS — H2511 Age-related nuclear cataract, right eye: Secondary | ICD-10-CM | POA: Diagnosis not present

## 2016-11-29 DIAGNOSIS — Z79899 Other long term (current) drug therapy: Secondary | ICD-10-CM | POA: Diagnosis not present

## 2016-11-29 DIAGNOSIS — H269 Unspecified cataract: Secondary | ICD-10-CM | POA: Insufficient documentation

## 2016-11-29 DIAGNOSIS — M1990 Unspecified osteoarthritis, unspecified site: Secondary | ICD-10-CM | POA: Insufficient documentation

## 2016-11-29 DIAGNOSIS — E119 Type 2 diabetes mellitus without complications: Secondary | ICD-10-CM | POA: Insufficient documentation

## 2016-11-29 DIAGNOSIS — Z87891 Personal history of nicotine dependence: Secondary | ICD-10-CM | POA: Insufficient documentation

## 2016-11-29 DIAGNOSIS — Z8711 Personal history of peptic ulcer disease: Secondary | ICD-10-CM | POA: Insufficient documentation

## 2016-11-29 DIAGNOSIS — J449 Chronic obstructive pulmonary disease, unspecified: Secondary | ICD-10-CM | POA: Diagnosis not present

## 2016-11-29 DIAGNOSIS — I10 Essential (primary) hypertension: Secondary | ICD-10-CM | POA: Diagnosis not present

## 2016-11-29 DIAGNOSIS — K219 Gastro-esophageal reflux disease without esophagitis: Secondary | ICD-10-CM | POA: Diagnosis not present

## 2016-11-29 DIAGNOSIS — H25811 Combined forms of age-related cataract, right eye: Secondary | ICD-10-CM | POA: Diagnosis not present

## 2016-11-29 HISTORY — PX: CATARACT EXTRACTION W/PHACO: SHX586

## 2016-11-29 LAB — GLUCOSE, CAPILLARY: GLUCOSE-CAPILLARY: 161 mg/dL — AB (ref 65–99)

## 2016-11-29 SURGERY — PHACOEMULSIFICATION, CATARACT, WITH IOL INSERTION
Anesthesia: Monitor Anesthesia Care | Site: Eye | Laterality: Right

## 2016-11-29 MED ORDER — MIDAZOLAM HCL 2 MG/2ML IJ SOLN
INTRAMUSCULAR | Status: AC
  Filled 2016-11-29: qty 2

## 2016-11-29 MED ORDER — SODIUM HYALURONATE 23 MG/ML IO SOLN
INTRAOCULAR | Status: DC | PRN
  Administered 2016-11-29: 0.6 mL via INTRAOCULAR

## 2016-11-29 MED ORDER — TETRACAINE HCL 0.5 % OP SOLN
1.0000 [drp] | OPHTHALMIC | Status: AC
  Administered 2016-11-29 (×3): 1 [drp] via OPHTHALMIC

## 2016-11-29 MED ORDER — FENTANYL CITRATE (PF) 100 MCG/2ML IJ SOLN
INTRAMUSCULAR | Status: AC
  Filled 2016-11-29: qty 2

## 2016-11-29 MED ORDER — EPINEPHRINE PF 1 MG/ML IJ SOLN
INTRAMUSCULAR | Status: AC
  Filled 2016-11-29: qty 1

## 2016-11-29 MED ORDER — FENTANYL CITRATE (PF) 100 MCG/2ML IJ SOLN
25.0000 ug | Freq: Once | INTRAMUSCULAR | Status: AC
  Administered 2016-11-29: 25 ug via INTRAVENOUS

## 2016-11-29 MED ORDER — MIDAZOLAM HCL 2 MG/2ML IJ SOLN
1.0000 mg | INTRAMUSCULAR | Status: AC
  Administered 2016-11-29: 2 mg via INTRAVENOUS

## 2016-11-29 MED ORDER — EPINEPHRINE PF 1 MG/ML IJ SOLN
INTRAMUSCULAR | Status: DC | PRN
  Administered 2016-11-29: 500 mL

## 2016-11-29 MED ORDER — LIDOCAINE HCL 3.5 % OP GEL
1.0000 "application " | Freq: Once | OPHTHALMIC | Status: AC
  Administered 2016-11-29: 1 via OPHTHALMIC

## 2016-11-29 MED ORDER — CYCLOPENTOLATE-PHENYLEPHRINE 0.2-1 % OP SOLN
1.0000 [drp] | OPHTHALMIC | Status: AC
Start: 2016-11-29 — End: 2016-11-29
  Administered 2016-11-29 (×3): 1 [drp] via OPHTHALMIC

## 2016-11-29 MED ORDER — PROVISC 10 MG/ML IO SOLN
INTRAOCULAR | Status: DC | PRN
  Administered 2016-11-29: 0.85 mL via INTRAOCULAR

## 2016-11-29 MED ORDER — LIDOCAINE HCL (PF) 1 % IJ SOLN
INTRAMUSCULAR | Status: DC | PRN
  Administered 2016-11-29: 1 mL via OPHTHALMIC

## 2016-11-29 MED ORDER — LACTATED RINGERS IV SOLN
INTRAVENOUS | Status: DC
  Administered 2016-11-29: 10:00:00 via INTRAVENOUS

## 2016-11-29 MED ORDER — PHENYLEPHRINE HCL 2.5 % OP SOLN
1.0000 [drp] | OPHTHALMIC | Status: AC
  Administered 2016-11-29 (×3): 1 [drp] via OPHTHALMIC

## 2016-11-29 MED ORDER — BSS IO SOLN
INTRAOCULAR | Status: DC | PRN
  Administered 2016-11-29: 15 mL via INTRAOCULAR

## 2016-11-29 MED ORDER — POVIDONE-IODINE 5 % OP SOLN
OPHTHALMIC | Status: DC | PRN
  Administered 2016-11-29: 1 via OPHTHALMIC

## 2016-11-29 MED ORDER — NEOMYCIN-POLYMYXIN-DEXAMETH 3.5-10000-0.1 OP SUSP
OPHTHALMIC | Status: DC | PRN
  Administered 2016-11-29: 2 [drp] via OPHTHALMIC

## 2016-11-29 SURGICAL SUPPLY — 15 items
CLOTH BEACON ORANGE TIMEOUT ST (SAFETY) ×3 IMPLANT
EYE SHIELD UNIVERSAL CLEAR (GAUZE/BANDAGES/DRESSINGS) ×3 IMPLANT
GLOVE BIOGEL PI IND STRL 6.5 (GLOVE) ×1 IMPLANT
GLOVE BIOGEL PI IND STRL 7.0 (GLOVE) ×1 IMPLANT
GLOVE BIOGEL PI INDICATOR 6.5 (GLOVE) ×2
GLOVE BIOGEL PI INDICATOR 7.0 (GLOVE) ×2
LENS ALC ACRYL/TECN (Ophthalmic Related) ×3 IMPLANT
NEEDLE HYPO 18GX1.5 BLUNT FILL (NEEDLE) ×3 IMPLANT
PAD ARMBOARD 7.5X6 YLW CONV (MISCELLANEOUS) ×3 IMPLANT
RING MALYGIN (MISCELLANEOUS) IMPLANT
SYR TB 1ML LL NO SAFETY (SYRINGE) ×3 IMPLANT
TAPE SURG TRANSPORE 1 IN (GAUZE/BANDAGES/DRESSINGS) ×1 IMPLANT
TAPE SURGICAL TRANSPORE 1 IN (GAUZE/BANDAGES/DRESSINGS) ×2
VISCOELASTIC ADDITIONAL (OPHTHALMIC RELATED) ×3 IMPLANT
WATER STERILE IRR 250ML POUR (IV SOLUTION) ×3 IMPLANT

## 2016-11-29 NOTE — Discharge Instructions (Signed)
Please discharge patient when stable, will follow up today with Dr. Marisa Hua at the [facility] office immediately after procedure.  Leave shield in place until visit.  All paperwork with discharge instructions will be given at the office.   Moderate Conscious Sedation, Adult, Care After These instructions provide you with information about caring for yourself after your procedure. Your health care provider may also give you more specific instructions. Your treatment has been planned according to current medical practices, but problems sometimes occur. Call your health care provider if you have any problems or questions after your procedure. What can I expect after the procedure? After your procedure, it is common:  To feel sleepy for several hours.  To feel clumsy and have poor balance for several hours.  To have poor judgment for several hours.  To vomit if you eat too soon.  Follow these instructions at home: For at least 24 hours after the procedure:   Do not: ? Participate in activities where you could fall or become injured. ? Drive. ? Use heavy machinery. ? Drink alcohol. ? Take sleeping pills or medicines that cause drowsiness. ? Make important decisions or sign legal documents. ? Take care of children on your own.  Rest. Eating and drinking  Follow the diet recommended by your health care provider.  If you vomit: ? Drink water, juice, or soup when you can drink without vomiting. ? Make sure you have little or no nausea before eating solid foods. General instructions  Have a responsible adult stay with you until you are awake and alert.  Take over-the-counter and prescription medicines only as told by your health care provider.  If you smoke, do not smoke without supervision.  Keep all follow-up visits as told by your health care provider. This is important. Contact a health care provider if:  You keep feeling nauseous or you keep vomiting.  You feel  light-headed.  You develop a rash.  You have a fever. Get help right away if:  You have trouble breathing. This information is not intended to replace advice given to you by your health care provider. Make sure you discuss any questions you have with your health care provider. Document Released: 12/02/2012 Document Revised: 07/17/2015 Document Reviewed: 06/03/2015 Elsevier Interactive Patient Education  [employer].

## 2016-11-29 NOTE — Anesthesia Preprocedure Evaluation (Signed)
Anesthesia Evaluation  Patient identified by MRN, date of birth, ID band Patient awake    Reviewed: Allergy & Precautions, NPO status , Patient's Chart, lab work & pertinent test results  Airway Mallampati: II  TM Distance: >3 FB     Dental  (+) Edentulous Upper, Edentulous Lower   Pulmonary asthma , COPD, former smoker,    breath sounds clear to auscultation       Cardiovascular hypertension,  Rhythm:Regular Rate:Normal     Neuro/Psych  Headaches,  Neuromuscular disease    GI/Hepatic PUD, GERD  ,  Endo/Other  diabetes, Type 2, Oral Hypoglycemic Agents  Renal/GU      Musculoskeletal  (+) Arthritis ,   Abdominal   Peds  Hematology   Anesthesia Other Findings   Reproductive/Obstetrics                             Anesthesia Physical Anesthesia Plan  ASA: III  Anesthesia Plan: MAC   Post-op Pain Management:    Induction: Intravenous  PONV Risk Score and Plan:   Airway Management Planned: Nasal Cannula  Additional Equipment:   Intra-op Plan:   Post-operative Plan:   Informed Consent: I have reviewed the patients History and Physical, chart, labs and discussed the procedure including the risks, benefits and alternatives for the proposed anesthesia with the patient or authorized representative who has indicated his/her understanding and acceptance.     Plan Discussed with:   Anesthesia Plan Comments:         Anesthesia Quick Evaluation  

## 2016-11-29 NOTE — Op Note (Signed)
Date of procedure: 11/29/16  Pre-operative diagnosis: Visually significant cataract, Right Eye  Post-operative diagnosis: Visually significant cataract, Right Eye  Procedure: Removal of cataract via phacoemulsification and insertion of intra-ocular lens AMO PCB00  +24.0D into the capsular bag of the Right Eye  Attending surgeon: Gerda Diss. Caitlin Hillmer, MD, MA  Anesthesia: MAC, Topical Akten  Complications: None  Estimated Blood Loss: <44m (minimal)  Specimens: None  Implants: As above  Indications:  Visually significant cataract, Right Eye  Procedure:  The patient was seen and identified in the pre-operative area. The operative eye was identified and dilated.  The operative eye was marked.  Topical anesthesia was administered to the operative eye.     The patient was then to the operative suite and placed in the supine position.  A timeout was performed confirming the patient, procedure to be performed, and all other relevant information.   The patient's face was prepped and draped in the usual fashion for intra-ocular surgery.  A lid speculum was placed into the operative eye and the surgical microscope moved into place and focused.  A superotemporal paracentesis was created using a 20 gauge paracentesis blade.  Shugarcaine was injected into the anterior chamber.  Viscoelastic was injected into the anterior chamber.  A temporal clear-corneal main wound incision was created using a 2.472mmicrokeratome.  A continuous curvilinear capsulorrhexis was initiated using an irrigating cystitome and completed using capsulorrhexis forceps.  Hydrodissection and hydrodeliniation were performed.  Viscoelastic was injected into the anterior chamber.  A phacoemulsification handpiece and a chopper as a second instrument were used to remove the nucleus and epinucleus. The irrigation/aspiration handpiece was used to remove any remaining cortical material.   The capsular bag was reinflated with viscoelastic,  checked, and found to be intact.  The intraocular lens was inserted into the capsular bag and dialed into place using a Kuglen hook.  The irrigation/aspiration handpiece was used to remove any remaining viscoelastic.  The clear corneal wound and paracentesis wounds were then hydrated and checked with Weck-Cels to be watertight.  The lid-speculum and drape was removed, and the patient's face was cleaned with a wet and dry 4x4.  Maxitrol was instilled in the eye before a clear shield was taped over the eye. The patient was taken to the post-operative care unit in good condition, having tolerated the procedure well.  Post-Op Instructions: [facility] and will receive all other orders and instructions.

## 2016-11-29 NOTE — H&P (Signed)
The H and P was reviewed and updated. The patient was examined.  No changes were found after exam.  The surgical eye was marked.  

## 2016-11-29 NOTE — Transfer of Care (Signed)
Immediate Anesthesia Transfer of Care Note  Patient: Michaela Moon  Procedure(s) Performed: CATARACT EXTRACTION PHACO AND INTRAOCULAR LENS PLACEMENT RIGHT EYE (Right Eye)  Patient Location: Short Stay  Anesthesia Type:MAC  Level of Consciousness: awake, alert , oriented and patient cooperative  Airway & Oxygen Therapy: Patient Spontanous Breathing  Post-op Assessment: Report given to RN and Post -op Vital signs reviewed and stable  Post vital signs: Reviewed and stable  Last Vitals:  Vitals:   11/29/16 1005 11/29/16 1010  BP:  (!) 161/69  Resp: 17 15  Temp:    SpO2: 93% 93%    Last Pain: There were no vitals filed for this visit.       Complications: No apparent anesthesia complications

## 2016-11-29 NOTE — Anesthesia Procedure Notes (Signed)
Procedure Name: MAC Date/Time: 11/29/2016 10:17 AM Performed by: Andree Elk, AMY A Pre-anesthesia Checklist: Patient identified, Timeout performed, Emergency Drugs available, Suction available and Patient being monitored Oxygen Delivery Method: Nasal cannula

## 2016-11-29 NOTE — Anesthesia Postprocedure Evaluation (Signed)
Anesthesia Post Note  Patient: Michaela Moon  Procedure(s) Performed: CATARACT EXTRACTION PHACO AND INTRAOCULAR LENS PLACEMENT RIGHT EYE (Right Eye)  Patient location during evaluation: Short Stay Anesthesia Type: MAC Level of consciousness: awake and alert and patient cooperative Pain management: pain level controlled Vital Signs Assessment: post-procedure vital signs reviewed and stable Respiratory status: spontaneous breathing Cardiovascular status: stable Postop Assessment: no apparent nausea or vomiting Anesthetic complications: no     Last Vitals:  Vitals:   11/29/16 1005 11/29/16 1010  BP:  (!) 161/69  Resp: 17 15  Temp:    SpO2: 93% 93%    Last Pain: There were no vitals filed for this visit.               Kalilah Barua A

## 2016-12-02 ENCOUNTER — Encounter (HOSPITAL_COMMUNITY): Payer: Self-pay | Admitting: Ophthalmology

## 2016-12-06 DIAGNOSIS — Z23 Encounter for immunization: Secondary | ICD-10-CM | POA: Diagnosis not present

## 2016-12-16 DIAGNOSIS — E1129 Type 2 diabetes mellitus with other diabetic kidney complication: Secondary | ICD-10-CM | POA: Diagnosis not present

## 2016-12-16 DIAGNOSIS — I10 Essential (primary) hypertension: Secondary | ICD-10-CM | POA: Diagnosis not present

## 2016-12-16 DIAGNOSIS — Z1389 Encounter for screening for other disorder: Secondary | ICD-10-CM | POA: Diagnosis not present

## 2016-12-16 DIAGNOSIS — E7849 Other hyperlipidemia: Secondary | ICD-10-CM | POA: Diagnosis not present

## 2016-12-16 DIAGNOSIS — Z0000 Encounter for general adult medical examination without abnormal findings: Secondary | ICD-10-CM | POA: Diagnosis not present

## 2016-12-16 DIAGNOSIS — K210 Gastro-esophageal reflux disease with esophagitis: Secondary | ICD-10-CM | POA: Diagnosis not present

## 2017-01-03 DIAGNOSIS — Z961 Presence of intraocular lens: Secondary | ICD-10-CM | POA: Diagnosis not present

## 2017-01-03 DIAGNOSIS — E119 Type 2 diabetes mellitus without complications: Secondary | ICD-10-CM | POA: Diagnosis not present

## 2017-01-03 DIAGNOSIS — H26493 Other secondary cataract, bilateral: Secondary | ICD-10-CM | POA: Diagnosis not present

## 2017-01-03 DIAGNOSIS — H353131 Nonexudative age-related macular degeneration, bilateral, early dry stage: Secondary | ICD-10-CM | POA: Diagnosis not present

## 2017-01-30 ENCOUNTER — Encounter (HOSPITAL_COMMUNITY): Payer: Self-pay | Admitting: Emergency Medicine

## 2017-01-30 ENCOUNTER — Emergency Department (HOSPITAL_COMMUNITY)
Admission: EM | Admit: 2017-01-30 | Discharge: 2017-01-30 | Disposition: A | Discharge status: Home / Self Care | Payer: Medicare Other | Attending: Emergency Medicine | Admitting: Emergency Medicine

## 2017-01-30 ENCOUNTER — Emergency Department (HOSPITAL_COMMUNITY): Payer: Medicare Other

## 2017-01-30 DIAGNOSIS — Y92009 Unspecified place in unspecified non-institutional (private) residence as the place of occurrence of the external cause: Secondary | ICD-10-CM | POA: Insufficient documentation

## 2017-01-30 DIAGNOSIS — E119 Type 2 diabetes mellitus without complications: Secondary | ICD-10-CM | POA: Insufficient documentation

## 2017-01-30 DIAGNOSIS — J449 Chronic obstructive pulmonary disease, unspecified: Secondary | ICD-10-CM | POA: Insufficient documentation

## 2017-01-30 DIAGNOSIS — W19XXXA Unspecified fall, initial encounter: Secondary | ICD-10-CM

## 2017-01-30 DIAGNOSIS — S8001XA Contusion of right knee, initial encounter: Secondary | ICD-10-CM | POA: Insufficient documentation

## 2017-01-30 DIAGNOSIS — Y999 Unspecified external cause status: Secondary | ICD-10-CM | POA: Diagnosis not present

## 2017-01-30 DIAGNOSIS — S8991XA Unspecified injury of right lower leg, initial encounter: Secondary | ICD-10-CM | POA: Diagnosis not present

## 2017-01-30 DIAGNOSIS — S46911A Strain of unspecified muscle, fascia and tendon at shoulder and upper arm level, right arm, initial encounter: Secondary | ICD-10-CM | POA: Diagnosis not present

## 2017-01-30 DIAGNOSIS — Z7982 Long term (current) use of aspirin: Secondary | ICD-10-CM | POA: Insufficient documentation

## 2017-01-30 DIAGNOSIS — W109XXA Fall (on) (from) unspecified stairs and steps, initial encounter: Secondary | ICD-10-CM | POA: Diagnosis not present

## 2017-01-30 DIAGNOSIS — Y9301 Activity, walking, marching and hiking: Secondary | ICD-10-CM | POA: Diagnosis not present

## 2017-01-30 DIAGNOSIS — I10 Essential (primary) hypertension: Secondary | ICD-10-CM | POA: Diagnosis not present

## 2017-01-30 DIAGNOSIS — J45909 Unspecified asthma, uncomplicated: Secondary | ICD-10-CM | POA: Diagnosis not present

## 2017-01-30 DIAGNOSIS — S4991XA Unspecified injury of right shoulder and upper arm, initial encounter: Secondary | ICD-10-CM | POA: Diagnosis present

## 2017-01-30 DIAGNOSIS — M25561 Pain in right knee: Secondary | ICD-10-CM | POA: Diagnosis not present

## 2017-01-30 DIAGNOSIS — M25562 Pain in left knee: Secondary | ICD-10-CM | POA: Diagnosis not present

## 2017-01-30 DIAGNOSIS — S8992XA Unspecified injury of left lower leg, initial encounter: Secondary | ICD-10-CM | POA: Diagnosis not present

## 2017-01-30 DIAGNOSIS — Z7984 Long term (current) use of oral hypoglycemic drugs: Secondary | ICD-10-CM | POA: Diagnosis not present

## 2017-01-30 DIAGNOSIS — Z87891 Personal history of nicotine dependence: Secondary | ICD-10-CM | POA: Insufficient documentation

## 2017-01-30 DIAGNOSIS — S8002XA Contusion of left knee, initial encounter: Secondary | ICD-10-CM | POA: Diagnosis not present

## 2017-01-30 DIAGNOSIS — S8000XA Contusion of unspecified knee, initial encounter: Secondary | ICD-10-CM

## 2017-01-30 DIAGNOSIS — M25511 Pain in right shoulder: Secondary | ICD-10-CM | POA: Diagnosis not present

## 2017-01-30 NOTE — Discharge Instructions (Signed)
As discussed your x-rays are okay today with no sign of bony injury from today's fall.  I recommend adding Tylenol 650 mg every 12 hours to your home medication routine which may help your arthritis pain better.

## 2017-01-30 NOTE — ED Triage Notes (Signed)
Pt states she tripped going up the stairs just pta and fell.  C/o bilateral knee pain and right shoulder pain.

## 2017-01-30 NOTE — ED Provider Notes (Signed)
[facility] EMERGENCY DEPARTMENT Provider Note   CSN: [id number] Arrival date & time: 01/30/17  1303     History   Chief Complaint Chief Complaint  Patient presents with  . Fall    HPI Michaela Moon is a 81 y.o. 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  She reports landing on her bilateral knees on a step and catching her upper body with her outstretched hands.  She denies pain in her hands but does have pain in her right shoulder along with her bilateral knees.  She denies any other pain or injuries.  She has had no treatments prior to arrival.  She denies hitting her head and had no LOC, dizziness or neck or increased back pain since this occurrence.  The history is provided by the patient.    Past Medical History:  Diagnosis Date  . Arthritis   . Asthma   . COPD (chronic obstructive pulmonary disease) ([place])   . Diabetes mellitus without complication ([place])   . GERD (gastroesophageal reflux disease)   . Headache   . Hyperlipidemia   . Hypertension   . Neuropathy   . Obesity   . Osteopenia     Patient Active Problem List   Diagnosis Date Noted  . Gastritis and gastroduodenitis 08/16/2016  . Erosive gastritis   . GERD (gastroesophageal reflux disease) 04/15/2016  . Dysphagia 04/15/2016  . COPD (chronic obstructive pulmonary disease) ([place])   . Pneumonia 01/29/2013  . Hypercalcemia 12/24/2012  . Pain in joint, ankle and foot 11/27/2012  . Peripheral neuropathy 11/27/2012  . Hypertension   . Hyperlipidemia   . Diabetes mellitus without complication ([place])   . Arthritis   . Obesity   . Osteopenia     Past Surgical History:  Procedure Laterality Date  . BREAST LUMPECTOMY     no cancer  . CATARACT EXTRACTION W/PHACO Left 11/01/2016   Procedure: CATARACT EXTRACTION PHACO AND INTRAOCULAR LENS PLACEMENT  (IOC);  Surgeon: Baruch Goldmann, MD;  Location: [facility];  Service: Ophthalmology;  Laterality: Left;  CDE: 3.83  . CATARACT EXTRACTION W/PHACO Right 11/29/2016   Procedure: CATARACT EXTRACTION PHACO AND INTRAOCULAR LENS PLACEMENT RIGHT EYE;  Surgeon: Baruch Goldmann, MD;  Location: [facility];  Service: Ophthalmology;  Laterality: Right;  CDE: 8.71  . colon tumor removed  2010   Dr. 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 COLONOSCOPY  05/2013   Dr. Anthony Sar: normal. (h/o colon polyps)  . COLONOSCOPY  2011   normal  . COLONOSCOPY  10/2003   Dr. Lindalou Hose: large polypoid mass hepatic fluexure  . ESOPHAGOGASTRODUODENOSCOPY N/A 04/29/2016   Procedure: ESOPHAGOGASTRODUODENOSCOPY (EGD);  Surgeon: Danie Binder, MD;  Location: [facility];  Service: Endoscopy;  Laterality: N/A;  9:15am  . SAVORY DILATION N/A 04/29/2016   Procedure: SAVORY DILATION;  Surgeon: Danie Binder, MD;  Location: [facility];  Service: Endoscopy;  Laterality: N/A;    OB History    No data available       Home Medications    Prior to Admission medications   Medication Sig Start Date End Date Taking? Authorizing Provider  albuterol-ipratropium (COMBIVENT) 18-103 MCG/ACT inhaler Inhale 2 puffs into the lungs every 6 (six) hours as needed for wheezing. 11/27/12   Vernie Shanks, MD  amLODipine ([place]) 5  MG tablet Take 5 mg by mouth daily.    [provider]  aspirin 81 MG tablet Take 81 mg by mouth daily as needed (blood flow).     [provider]  atorvastatin (LIPITOR) 80 MG tablet ALTERNATE TAKING 1 TABLET EVERY OTHER DAY WITH 1/2 TABLET EVERY OTHERDAY Patient taking differently: ALTERNATE TAKING 1 TABLET EVERY OTHER DAY WITH 1/2 TABLET EVERY OTHER DAY 01/13/14   Chipper Herb, MD  cholecalciferol (VITAMIN D) 1000 units tablet Take 1,000 Units by mouth daily.    [provider]  gabapentin (NEURONTIN) 400 MG capsule Take 400  mg by mouth 3 (three) times daily.    [provider]  glimepiride (AMARYL) 2 MG tablet Take 2 mg by mouth daily with breakfast.    [provider]  losartan-hydrochlorothiazide (HYZAAR) 100-12.5 MG per tablet TAKE ONE (1) TABLET EACH DAY 03/09/14   Chipper Herb, MD  meloxicam (MOBIC) 7.5 MG tablet Take 7.5 mg by mouth daily.    [provider]  metFORMIN (GLUCOPHAGE) 1000 MG tablet TAKE ONE TABLET TWICE A DAY WITH FOOD 06/10/13   Vernie Shanks, MD  metoprolol (LOPRESSOR) 50 MG tablet Take 50 mg by mouth 2 (two) times daily.    [provider]  Omega-3 Fatty Acids (FISH OIL) 1000 MG CAPS Take 1 capsule by mouth 3 (three) times daily.    [provider]  oxybutynin (DITROPAN) 5 MG tablet Take 5 mg by mouth 3 (three) times daily.    [provider]  prednisoLONE acetate (PRED FORTE) 1 % ophthalmic suspension 1 drop 3 (three) times daily.    [provider]  ranitidine (ZANTAC) 150 MG tablet Take 300 mg by mouth 2 (two) times daily.    [provider]  sitaGLIPtin (JANUVIA) 100 MG tablet Take 1 tablet (100 mg total) by mouth daily. 06/10/13   Vernie Shanks, MD    Family History Family History  Problem Relation Age of Onset  . Melanoma Brother   . Colon cancer Neg Hx     Social History Social History   Tobacco Use  . Smoking status: Former Smoker    Packs/day: 2.00    Years: 20.00    Pack years: 40.00    Types: Cigarettes    Last attempt to quit: 02/25/2002    Years since quitting: 14.9  . Smokeless tobacco: Never Used  Substance Use Topics  . Alcohol use: No  . Drug use: No     Allergies   Other   Review of Systems Review of Systems  Constitutional: Negative for fever.  Musculoskeletal: Positive for arthralgias. Negative for joint swelling and myalgias.  Neurological: Negative for weakness and numbness.     Physical Exam Updated Vital Signs BP (!) 160/94 (BP Location: Right Arm)   Pulse 98    Temp 98.2 F (36.8 C) (Oral)   Resp 19   Ht 5\' 4"  (1.626 m)   Wt 101.6 kg (224 lb)   SpO2 96%   BMI 38.45 kg/m   Physical Exam  Constitutional: She appears well-developed and well-nourished.  HENT:  Head: Atraumatic.  Neck: Normal range of motion.  Cardiovascular:  Pulses equal bilaterally  Musculoskeletal: She exhibits tenderness. She exhibits no edema.       Right shoulder: She exhibits bony tenderness. She exhibits no swelling, no effusion, no crepitus, no deformity and no spasm.  Tender anterior right humeral head with no palpable deformity.  She has full range of motion of  the right shoulder without significant discomfort.  She has equal grip strength bilaterally.  Hands and wrists are nontender and she displays full active range of motion.  Tender to palpation right anterior and medial knee without edema, bruising or palpable deformity.  No pain with varus strain, mild pain with valgus strain medially.  Patient displays full range of motion of bilateral knees.  Neurological: She is alert. She has normal strength. She displays normal reflexes. No sensory deficit.  Skin: Skin is warm and dry.  Psychiatric: She has a normal mood and affect.     ED Treatments / Results  Labs (all labs ordered are listed, but only abnormal results are displayed) Labs Reviewed - No data to display  EKG  EKG Interpretation None       Radiology Dg Shoulder Right  Result Date: 01/30/2017 CLINICAL DATA:  [place] on steps today.  Pain. EXAM: RIGHT SHOULDER - 2+ VIEW COMPARISON:  None. FINDINGS: There is no evidence of fracture or dislocation. There is no evidence of arthropathy or other focal bone abnormality. Soft tissues are unremarkable. IMPRESSION: Negative. Electronically Signed   By: Nelson Chimes M.D.   On: 01/30/2017 13:50   Dg Knee Complete 4 Views Left  Result Date: 01/30/2017 CLINICAL DATA:  [place] on steps today.  Knee pain. EXAM: LEFT KNEE - COMPLETE 4+ VIEW COMPARISON:  None. FINDINGS:  No evidence of fracture, dislocation, or joint effusion. No evidence of arthropathy or other focal bone abnormality. Soft tissues are unremarkable. IMPRESSION: Negative. Electronically Signed   By: Nelson Chimes M.D.   On: 01/30/2017 13:49   Dg Knee Complete 4 Views Right  Result Date: 01/30/2017 CLINICAL DATA:  [place] on steps today.  Pain. EXAM: RIGHT KNEE - COMPLETE 4+ VIEW COMPARISON:  None. FINDINGS: No evidence of fracture, dislocation, or joint effusion. No evidence of arthropathy or other focal bone abnormality. Soft tissues are unremarkable. IMPRESSION: Negative. Electronically Signed   By: Nelson Chimes M.D.   On: 01/30/2017 13:50    Procedures Procedures (including critical care time)  Medications Ordered in ED Medications - No data to display   Initial Impression / Assessment and Plan / ED Course  I have reviewed the triage vital signs and the nursing notes.  Pertinent labs & imaging results that were available during my care of the patient were reviewed by me and considered in my medical decision making (see chart for details).     Patient was discussed with Dr. Roderic Palau prior to discharge home.  Patient advised Tylenol, discussed ice and heat for acute pain relief and for chronic knee pain relief.  She was given referrals to local primary physicians as she expressed interest in obtaining a new local PCP.  PRN follow-up anticipated. Final Clinical Impressions(s) / ED Diagnoses   Final diagnoses:  Fall in home, initial encounter  Contusion of knee, unspecified laterality, initial encounter  Strain of right shoulder, initial encounter    ED Discharge Orders    None       Landis Martins 01/30/17 1505    Milton Ferguson, MD 01/30/17 1531

## 2017-02-24 DIAGNOSIS — E7849 Other hyperlipidemia: Secondary | ICD-10-CM | POA: Diagnosis not present

## 2017-02-24 DIAGNOSIS — E1129 Type 2 diabetes mellitus with other diabetic kidney complication: Secondary | ICD-10-CM | POA: Diagnosis not present

## 2017-02-24 DIAGNOSIS — I10 Essential (primary) hypertension: Secondary | ICD-10-CM | POA: Diagnosis not present

## 2017-03-27 ENCOUNTER — Encounter: Payer: Self-pay | Admitting: Orthopedic Surgery

## 2017-03-27 DIAGNOSIS — E1129 Type 2 diabetes mellitus with other diabetic kidney complication: Secondary | ICD-10-CM | POA: Diagnosis not present

## 2017-03-27 DIAGNOSIS — Z0000 Encounter for general adult medical examination without abnormal findings: Secondary | ICD-10-CM | POA: Diagnosis not present

## 2017-03-27 DIAGNOSIS — I10 Essential (primary) hypertension: Secondary | ICD-10-CM | POA: Diagnosis not present

## 2017-03-27 DIAGNOSIS — Z1389 Encounter for screening for other disorder: Secondary | ICD-10-CM | POA: Diagnosis not present

## 2017-03-27 DIAGNOSIS — K210 Gastro-esophageal reflux disease with esophagitis: Secondary | ICD-10-CM | POA: Diagnosis not present

## 2017-03-27 DIAGNOSIS — E7849 Other hyperlipidemia: Secondary | ICD-10-CM | POA: Diagnosis not present

## 2017-04-09 DIAGNOSIS — I10 Essential (primary) hypertension: Secondary | ICD-10-CM | POA: Diagnosis not present

## 2017-04-09 DIAGNOSIS — K210 Gastro-esophageal reflux disease with esophagitis: Secondary | ICD-10-CM | POA: Diagnosis not present

## 2017-04-09 DIAGNOSIS — E1129 Type 2 diabetes mellitus with other diabetic kidney complication: Secondary | ICD-10-CM | POA: Diagnosis not present

## 2017-04-09 DIAGNOSIS — E7849 Other hyperlipidemia: Secondary | ICD-10-CM | POA: Diagnosis not present

## 2017-04-18 DIAGNOSIS — M25511 Pain in right shoulder: Secondary | ICD-10-CM | POA: Diagnosis not present

## 2017-04-18 DIAGNOSIS — S46911A Strain of unspecified muscle, fascia and tendon at shoulder and upper arm level, right arm, initial encounter: Secondary | ICD-10-CM | POA: Diagnosis not present

## 2017-04-28 DIAGNOSIS — S46091A Other injury of muscle(s) and tendon(s) of the rotator cuff of right shoulder, initial encounter: Secondary | ICD-10-CM | POA: Diagnosis not present

## 2017-05-23 DIAGNOSIS — E7849 Other hyperlipidemia: Secondary | ICD-10-CM | POA: Diagnosis not present

## 2017-05-23 DIAGNOSIS — E1129 Type 2 diabetes mellitus with other diabetic kidney complication: Secondary | ICD-10-CM | POA: Diagnosis not present

## 2017-05-23 DIAGNOSIS — I10 Essential (primary) hypertension: Secondary | ICD-10-CM | POA: Diagnosis not present

## 2017-06-18 DIAGNOSIS — E1129 Type 2 diabetes mellitus with other diabetic kidney complication: Secondary | ICD-10-CM | POA: Diagnosis not present

## 2017-06-18 DIAGNOSIS — E7849 Other hyperlipidemia: Secondary | ICD-10-CM | POA: Diagnosis not present

## 2017-06-18 DIAGNOSIS — I10 Essential (primary) hypertension: Secondary | ICD-10-CM | POA: Diagnosis not present

## 2017-06-19 ENCOUNTER — Encounter: Payer: Self-pay | Admitting: Gastroenterology

## 2017-07-02 DIAGNOSIS — I10 Essential (primary) hypertension: Secondary | ICD-10-CM | POA: Diagnosis not present

## 2017-07-02 DIAGNOSIS — K219 Gastro-esophageal reflux disease without esophagitis: Secondary | ICD-10-CM | POA: Diagnosis not present

## 2017-07-02 DIAGNOSIS — E1141 Type 2 diabetes mellitus with diabetic mononeuropathy: Secondary | ICD-10-CM | POA: Diagnosis not present

## 2017-08-05 DIAGNOSIS — G5602 Carpal tunnel syndrome, left upper limb: Secondary | ICD-10-CM | POA: Diagnosis not present

## 2017-08-08 DIAGNOSIS — E119 Type 2 diabetes mellitus without complications: Secondary | ICD-10-CM | POA: Diagnosis not present

## 2017-08-08 DIAGNOSIS — H04123 Dry eye syndrome of bilateral lacrimal glands: Secondary | ICD-10-CM | POA: Diagnosis not present

## 2017-08-08 DIAGNOSIS — Z7984 Long term (current) use of oral hypoglycemic drugs: Secondary | ICD-10-CM | POA: Diagnosis not present

## 2017-08-08 DIAGNOSIS — H353131 Nonexudative age-related macular degeneration, bilateral, early dry stage: Secondary | ICD-10-CM | POA: Diagnosis not present

## 2017-08-18 DIAGNOSIS — R200 Anesthesia of skin: Secondary | ICD-10-CM | POA: Diagnosis not present

## 2017-08-18 DIAGNOSIS — M1990 Unspecified osteoarthritis, unspecified site: Secondary | ICD-10-CM | POA: Diagnosis not present

## 2017-08-18 DIAGNOSIS — M542 Cervicalgia: Secondary | ICD-10-CM | POA: Diagnosis not present

## 2017-08-26 DIAGNOSIS — G5600 Carpal tunnel syndrome, unspecified upper limb: Secondary | ICD-10-CM | POA: Diagnosis not present

## 2017-09-04 DIAGNOSIS — M79642 Pain in left hand: Secondary | ICD-10-CM | POA: Diagnosis not present

## 2017-09-04 DIAGNOSIS — G5601 Carpal tunnel syndrome, right upper limb: Secondary | ICD-10-CM | POA: Diagnosis not present

## 2017-09-04 DIAGNOSIS — G5602 Carpal tunnel syndrome, left upper limb: Secondary | ICD-10-CM | POA: Diagnosis not present

## 2017-09-04 DIAGNOSIS — R208 Other disturbances of skin sensation: Secondary | ICD-10-CM | POA: Diagnosis not present

## 2017-09-18 ENCOUNTER — Encounter: Payer: Self-pay | Admitting: Orthopedic Surgery

## 2017-09-18 DIAGNOSIS — G5601 Carpal tunnel syndrome, right upper limb: Secondary | ICD-10-CM | POA: Diagnosis not present

## 2017-09-18 DIAGNOSIS — G5602 Carpal tunnel syndrome, left upper limb: Secondary | ICD-10-CM | POA: Diagnosis not present

## 2017-10-08 DIAGNOSIS — E119 Type 2 diabetes mellitus without complications: Secondary | ICD-10-CM | POA: Diagnosis not present

## 2017-10-08 DIAGNOSIS — I10 Essential (primary) hypertension: Secondary | ICD-10-CM | POA: Diagnosis not present

## 2017-10-08 DIAGNOSIS — E782 Mixed hyperlipidemia: Secondary | ICD-10-CM | POA: Diagnosis not present

## 2017-10-09 DIAGNOSIS — I10 Essential (primary) hypertension: Secondary | ICD-10-CM | POA: Diagnosis not present

## 2017-10-09 DIAGNOSIS — G5602 Carpal tunnel syndrome, left upper limb: Secondary | ICD-10-CM | POA: Diagnosis not present

## 2017-10-09 DIAGNOSIS — E782 Mixed hyperlipidemia: Secondary | ICD-10-CM | POA: Diagnosis not present

## 2017-10-09 DIAGNOSIS — E119 Type 2 diabetes mellitus without complications: Secondary | ICD-10-CM | POA: Diagnosis not present

## 2017-10-13 ENCOUNTER — Ambulatory Visit (INDEPENDENT_AMBULATORY_CARE_PROVIDER_SITE_OTHER): Payer: Medicare Other | Admitting: Orthopedic Surgery

## 2017-10-13 VITALS — BP 143/74 | HR 68 | Ht 61.0 in | Wt 201.0 lb

## 2017-10-13 DIAGNOSIS — G5623 Lesion of ulnar nerve, bilateral upper limbs: Secondary | ICD-10-CM

## 2017-10-13 NOTE — Progress Notes (Signed)
Patient ID: Michaela Moon, female   DOB: 29-Oct-1935, 82 y.o.   MRN: [id number]  Chief Complaint  Patient presents with  . Carpal Tunnel    CTS, referred by Dr. Merlene Laughter for surgery.    HPI Michaela KISSINGER is a 82 y.o. female.  Presents for evaluation of bilateral carpal tunnel syndrome diagnosed with nerve conduction studies  She complains of bilateral hand pain with numbness and tingling severe radiates from the hand up into the forearm associated with decreased ability to hold onto small objects and do fine motor tasks  Symptoms have been present for over 6 months  She has a positive history of diabetes with a hemoglobin A1c of 7.8  Review of Systems Review of Systems  Constitutional: Negative for chills.  Respiratory: Positive for shortness of breath.   Musculoskeletal: Positive for arthralgias.  Neurological: Positive for weakness and numbness.     Past Medical History:  Diagnosis Date  . Arthritis   . Asthma   . COPD (chronic obstructive pulmonary disease) ([place])   . Diabetes mellitus without complication ([place])   . GERD (gastroesophageal reflux disease)   . Headache   . Hyperlipidemia   . Hypertension   . Neuropathy   . Obesity   . Osteopenia     Past Surgical History:  Procedure Laterality Date  . BREAST LUMPECTOMY     no cancer  . CATARACT EXTRACTION W/PHACO Left 11/01/2016   Procedure: CATARACT EXTRACTION PHACO AND INTRAOCULAR LENS PLACEMENT (IOC);  Surgeon: Baruch Goldmann, MD;  Location: [facility];  Service: Ophthalmology;  Laterality: Left;  CDE: 3.83  . CATARACT EXTRACTION W/PHACO Right 11/29/2016   Procedure: CATARACT EXTRACTION PHACO AND INTRAOCULAR LENS PLACEMENT RIGHT EYE;  Surgeon: Baruch Goldmann, MD;  Location: [facility];  Service: Ophthalmology;  Laterality: Right;  CDE: 8.71  . colon tumor removed  2010   Dr. 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 COLONOSCOPY  05/2013   Dr. Anthony Sar: normal.  (h/o colon polyps)  . COLONOSCOPY  2011   normal  . COLONOSCOPY  10/2003   Dr. Lindalou Hose: large polypoid mass hepatic fluexure  . ESOPHAGOGASTRODUODENOSCOPY N/A 04/29/2016   Procedure: ESOPHAGOGASTRODUODENOSCOPY (EGD);  Surgeon: Danie Binder, MD;  Location: [facility];  Service: Endoscopy;  Laterality: N/A;  9:15am  . SAVORY DILATION N/A 04/29/2016   Procedure: SAVORY DILATION;  Surgeon: Danie Binder, MD;  Location: [facility];  Service: Endoscopy;  Laterality: N/A;    Family History  Problem Relation Age of Onset  . Melanoma Brother   . Colon cancer Neg Hx     Social History Social History   Tobacco Use  . Smoking status: Former Smoker    Packs/day: 2.00    Years: 20.00    Pack years: 40.00    Types: Cigarettes    Last attempt to quit: 02/25/2002    Years since quitting: 15.6  . Smokeless tobacco: Never Used  Substance Use Topics  . Alcohol use: No  . Drug use: No    Allergies  Allergen Reactions  . Other     Onions make her wheeze    Current Outpatient Medications  Medication Sig Dispense Refill  . albuterol-ipratropium (COMBIVENT) 18-103 MCG/ACT inhaler Inhale 2 puffs into the lungs every 6 (six) hours as needed for wheezing. 14.7 g 2  . amLODipine ([place]) 5 MG tablet Take 5 mg by mouth daily.    Marland Kitchen aspirin 81 MG tablet Take  81 mg by mouth daily as needed (blood flow).     Marland Kitchen atorvastatin (LIPITOR) 80 MG tablet ALTERNATE TAKING 1 TABLET EVERY OTHER DAY WITH 1/2 TABLET EVERY OTHERDAY (Patient taking differently: ALTERNATE TAKING 1 TABLET EVERY OTHER DAY WITH 1/2 TABLET EVERY OTHER DAY) 22 tablet 0  . cholecalciferol (VITAMIN D) 1000 units tablet Take 1,000 Units by mouth daily.    Marland Kitchen gabapentin (NEURONTIN) 400 MG capsule Take 400 mg by mouth 3 (three) times daily.    Marland Kitchen glimepiride (AMARYL) 2 MG tablet Take 2 mg by mouth daily with breakfast.    . losartan-hydrochlorothiazide (HYZAAR) 100-12.5 MG per tablet TAKE ONE (1) TABLET EACH DAY 30 tablet 0  . meloxicam  (MOBIC) 7.5 MG tablet Take 7.5 mg by mouth daily.    . metFORMIN (GLUCOPHAGE) 1000 MG tablet TAKE ONE TABLET TWICE A DAY WITH FOOD 60 tablet 5  . metoprolol (LOPRESSOR) 50 MG tablet Take 50 mg by mouth 2 (two) times daily.    . Omega-3 Fatty Acids (FISH OIL) 1000 MG CAPS Take 1 capsule by mouth 3 (three) times daily.    Marland Kitchen oxybutynin (DITROPAN) 5 MG tablet Take 5 mg by mouth 3 (three) times daily.    . prednisoLONE acetate (PRED FORTE) 1 % ophthalmic suspension 1 drop 3 (three) times daily.    . ranitidine (ZANTAC) 150 MG tablet Take 300 mg by mouth 2 (two) times daily.    . sitaGLIPtin (JANUVIA) 100 MG tablet Take 1 tablet (100 mg total) by mouth daily. 30 tablet 5   No current facility-administered medications for this visit.     BP (!) 143/74   Pulse 68   Ht 5\' 1"  (1.549 m)   Wt 201 lb (91.2 kg)   BMI 37.98 kg/m   Physical Exam Blood pressure (!) 143/74, pulse 68, height 5\' 1"  (1.549 m), weight 201 lb (91.2 kg). Physical Exam The patient is well developed well nourished and well groomed.  Orientation to person place and time is normal  Mood is pleasant.  Ambulatory status normal gait and stance   Right and left upper extremity upper extremity examination reveals the following:  Inspection reveals no swelling. There is tenderness over the carpal tunnel.  There is tenderness over the ulnocarpal joint bilaterally and radiocarpal joint bilaterally with swelling   Range of motion of the wrist and elbow are normal  Motor exam shows mild weakness with grip strength.  Wrist joint is stable  Provocative tests for carpal tunnel Phalen's test positive bilateral at 10 to 15 seconds Carpal tunnel compression test positive bilaterally Tinel's test positive left negative right  Pulses are normal in the radial and ulnar artery with a normal Allen's test.  Decreased sensation is noted in the median nerve distribution. Soft touch is normal.   Plan     MEDICAL DECISION SECTION   xrays ordered?  No  My independent reading of xrays: Not applicable   Encounter Diagnosis  Name Primary?  . Cubital tunnel syndrome, bilateral Yes     PLAN: Left carpal tunnel release  No orders of the defined types were placed in this encounter.  Injection?  No MRI/CT/?  No  The patient would like to proceed with surgical intervention on the left wrist/hand for carpal tunnel release with the understanding that symptoms can come back especially for carpal tunnel is not controlled and she also has a risk factor of smoking  The procedure has been fully reviewed with the patient; The risks and benefits of  surgery have been discussed and explained and understood. Alternative treatment has also been reviewed, questions were encouraged and answered. The postoperative plan is also been reviewed.

## 2017-10-13 NOTE — Patient Instructions (Signed)
Carpal Tunnel Syndrome Carpal tunnel syndrome is a condition that causes pain in your hand and arm. The carpal tunnel is a narrow area located on the palm side of your wrist. Repeated wrist motion or certain diseases may cause swelling within the tunnel. This swelling pinches the main nerve in the wrist (median nerve). What are the causes? This condition may be caused by:  Repeated wrist motions.  Wrist injuries.  Arthritis.  A cyst or tumor in the carpal tunnel.  Fluid buildup during pregnancy.  Sometimes the cause of this condition is not known. What increases the risk? This condition is more likely to develop in:  People who have jobs that cause them to repeatedly move their wrists in the same motion, such as butchers and cashiers.  Women.  People with certain conditions, such as: ? Diabetes. ? Obesity. ? An underactive thyroid (hypothyroidism). ? Kidney failure.  What are the signs or symptoms? Symptoms of this condition include:  A tingling feeling in your fingers, especially in your thumb, index, and middle fingers.  Tingling or numbness in your hand.  An aching feeling in your entire arm, especially when your wrist and elbow are bent for long periods of time.  Wrist pain that goes up your arm to your shoulder.  Pain that goes down into your palm or fingers.  A weak feeling in your hands. You may have trouble grabbing and holding items.  Your symptoms may feel worse during the night. How is this diagnosed? This condition is diagnosed with a medical history and physical exam. You may also have tests, including:  An electromyogram (EMG). This test measures electrical signals sent by your nerves into the muscles.  X-rays.  How is this treated? Treatment for this condition includes:  Lifestyle changes. It is important to stop doing or modify the activity that caused your condition.  Physical or occupational therapy.  Medicines for pain and inflammation.  This may include medicine that is injected into your wrist.  A wrist splint.  Surgery.  Follow these instructions at home: If you have a splint:  Wear it as told by your health care provider. Remove it only as told by your health care provider.  Loosen the splint if your fingers become numb and tingle, or if they turn cold and blue.  Keep the splint clean and dry. General instructions  Take over-the-counter and prescription medicines only as told by your health care provider.  Rest your wrist from any activity that may be causing your pain. If your condition is work related, talk to your employer about changes that can be made, such as getting a wrist pad to use while typing.  If directed, apply ice to the painful area: ? Put ice in a plastic bag. ? Place a towel between your skin and the bag. ? Leave the ice on for 20 minutes, 2-3 times per day.  Keep all follow-up visits as told by your health care provider. This is important.  Do any exercises as told by your health care provider, physical therapist, or occupational therapist. Contact a health care provider if:  You have new symptoms.  Your pain is not controlled with medicines.  Your symptoms get worse. This information is not intended to replace advice given to you by your health care provider. Make sure you discuss any questions you have with your health care provider. Document Released: 02/09/2000 Document Revised: 06/22/2015 Document Reviewed: 06/29/2014 Elsevier Interactive Patient Education  2018 [employer]  

## 2017-10-16 DIAGNOSIS — G5602 Carpal tunnel syndrome, left upper limb: Secondary | ICD-10-CM | POA: Diagnosis not present

## 2017-10-16 DIAGNOSIS — R208 Other disturbances of skin sensation: Secondary | ICD-10-CM | POA: Diagnosis not present

## 2017-10-16 DIAGNOSIS — E1140 Type 2 diabetes mellitus with diabetic neuropathy, unspecified: Secondary | ICD-10-CM | POA: Diagnosis not present

## 2017-10-16 DIAGNOSIS — G5601 Carpal tunnel syndrome, right upper limb: Secondary | ICD-10-CM | POA: Diagnosis not present

## 2017-10-17 NOTE — Patient Instructions (Signed)
Michaela Moon  10/17/2017     @PREFPERIOPPHARMACY @   Your procedure is scheduled on  10/23/2017   Report to [facility] at  1100   A.M.  Call this number if you have problems the morning of surgery:  [phone]   Remember:  Do not eat or drink after midnight.  You may drink clear liquids until  12 midnight 10/22/2017 .  Clear liquids allowed are:                    Water, Juice (non-citric and without pulp), Carbonated beverages, Clear Tea, Black Coffee only, Plain Jell-O only, Gatorade and Plain Popsicles only    Take these medicines the morning of surgery with A SIP OF WATER  Amlodipine, neurontin, losartan, mobic or ultram, metoprolol, ditropan, zantac. Use your inhaler before you come.    Do not wear jewelry, make-up or nail polish.  Do not wear lotions, powders, or perfumes, or deodorant.  Do not shave 48 hours prior to surgery.  Men may shave face and neck.  Do not bring valuables to the hospital.  [facility] is not responsible for any belongings or valuables.  Contacts, dentures or bridgework may not be worn into surgery.  Leave your suitcase in the car.  After surgery it may be brought to your room.  For patients admitted to the hospital, discharge time will be determined by your treatment team.  Patients discharged the day of surgery will not be allowed to drive home.   Name and phone number of your driver:   family Special instructions:  None  Please read over the following fact sheets that you were given. Anesthesia Post-op Instructions and Care and Recovery After Surgery       Carpal Tunnel Release Carpal tunnel release is a surgical procedure to relieve numbness and pain in your hand that are caused by carpal tunnel syndrome. Your carpal tunnel is a narrow, hollow space in your wrist. It passes between your wrist bones and a band of connective tissue (transverse carpal ligament). The nerve that supplies most of your hand (median nerve) passes  through this space, and so do the connections between your fingers and the muscles of your arm (tendons). Carpal tunnel syndrome makes this space swell and become narrow, and this causes pain and numbness. In carpal tunnel release surgery, a surgeon cuts through the transverse carpal ligament to make more room in the carpal tunnel space. You may have this surgery if other types of treatment have not worked. Tell a health care provider about:  Any allergies you have.  All medicines you are taking, including vitamins, herbs, eye drops, creams, and over-the-counter medicines.  Any problems you or family members have had with anesthetic medicines.  Any blood disorders you have.  Any surgeries you have had.  Any medical conditions you have. What are the risks? Generally, this is a safe procedure. However, problems may occur, including:  Bleeding.  Infection.  Injury to the median nerve.  Need for additional surgery.  What happens before the procedure?  Ask your health care provider about: ? Changing or stopping your regular medicines. This is especially important if you are taking diabetes medicines or blood thinners. ? Taking medicines such as aspirin and ibuprofen. These medicines can thin your blood. Do not take these medicines before your procedure if your health care provider instructs you not to.  Do not eat or drink anything  after midnight on the night before the procedure or as directed by your health care provider.  Plan to have someone take you home after the procedure. What happens during the procedure?  An IV tube may be inserted into a vein.  You will be given one of the following: ? A medicine that numbs the wrist area (local anesthetic). You may also be given a medicine to make you relax (sedative). ? A medicine that makes you go to sleep (general anesthetic).  Your arm, hand, and wrist will be cleaned with a germ-killing solution (antiseptic).  Your surgeon  will make a surgical cut (incision) over the palm side of your wrist. The surgeon will pull aside the skin of your wrist to expose the carpal tunnel space.  The surgeon will cut the transverse carpal ligament.  The edges of the incision will be closed with stitches (sutures) or staples.  A bandage (dressing) will be placed over your wrist and wrapped around your hand and wrist. What happens after the procedure?  You may spend some time in a recovery area.  Your blood pressure, heart rate, breathing rate, and blood oxygen level will be monitored often until the medicines you were given have worn off.  You will likely have some pain. You will be given pain medicine.  You may need to wear a splint or a wrist brace over your dressing. This information is not intended to replace advice given to you by your health care provider. Make sure you discuss any questions you have with your health care provider. Document Released: 05/04/2003 Document Revised: 07/20/2015 Document Reviewed: 09/29/2013 Elsevier Interactive Patient Education  2018 [place] After Refer to this sheet in the next few weeks. These instructions provide you with information about caring for yourself after your procedure. Your health care provider may also give you more specific instructions. Your treatment has been planned according to current medical practices, but problems sometimes occur. Call your health care provider if you have any problems or questions after your procedure. What can I expect after the procedure? After your procedure, it is typical to have the following:  Pain.  Numbness.  Tingling.  Swelling.  Stiffness.  Bruising.  Follow these instructions at home:  Take medicines only as directed by your health care provider.  There are many different ways to close and cover an incision, including stitches (sutures), skin glue, and adhesive strips. Follow your health care  provider's instructions about: ? Incision care. ? Bandage (dressing) changes and removal. ? Incision closure removal.  Wear a splint or a brace as directed by your surgeon. You may need to do this for 2-3 weeks.  Keep your hand raised (elevated) above the level of your heart while you are resting. Move your fingers often.  Avoid activities that cause hand pain.  Ask your surgeon when you can start to do all of your usual activities again, such as: ? Driving. ? Returning to work. ? Bathing and swimming.  Keep all follow-up visits as directed by your health care provider. This is important. You may need physical therapy for several months to speed healing and regain movement. Contact a health care provider if:  You have drainage, redness, swelling, or pain at your incision site.  You have a fever.  You have chills.  Your pain medicine is not working.  Your symptoms do not go away after 2 months.  Your symptoms go away and then return. Get help right away  if:  You have pain or numbness that is getting worse.  Your fingers change color.  You are not able to move your fingers. This information is not intended to replace advice given to you by your health care provider. Make sure you discuss any questions you have with your health care provider. Document Released: 08/31/2004 Document Revised: 07/20/2015 Document Reviewed: 09/29/2013 Elsevier Interactive Patient Education  2018 [place] Anesthesia is a term that refers to techniques, procedures, and medicines that help a person stay safe and comfortable during a medical procedure. Monitored anesthesia care, or sedation, is one type of anesthesia. Your anesthesia specialist may recommend sedation if you will be having a procedure that does not require you to be unconscious, such as:  Cataract surgery.  A dental procedure.  A biopsy.  A colonoscopy.  During the procedure, you may receive a  medicine to help you relax (sedative). There are three levels of sedation:  Mild sedation. At this level, you may feel awake and relaxed. You will be able to follow directions.  Moderate sedation. At this level, you will be sleepy. You may not remember the procedure.  Deep sedation. At this level, you will be asleep. You will not remember the procedure.  The more medicine you are given, the deeper your level of sedation will be. Depending on how you respond to the procedure, the anesthesia specialist may change your level of sedation or the type of anesthesia to fit your needs. An anesthesia specialist will monitor you closely during the procedure. Let your health care provider know about:  Any allergies you have.  All medicines you are taking, including vitamins, herbs, eye drops, creams, and over-the-counter medicines.  Any use of steroids (by mouth or as a cream).  Any problems you or family members have had with sedatives and anesthetic medicines.  Any blood disorders you have.  Any surgeries you have had.  Any medical conditions you have, such as sleep apnea.  Whether you are pregnant or may be pregnant.  Any use of cigarettes, alcohol, or street drugs. What are the risks? Generally, this is a safe procedure. However, problems may occur, including:  Getting too much medicine (oversedation).  Nausea.  Allergic reaction to medicines.  Trouble breathing. If this happens, a breathing tube may be used to help with breathing. It will be removed when you are awake and breathing on your own.  Heart trouble.  Lung trouble.  Before the procedure Staying hydrated Follow instructions from your health care provider about hydration, which may include:  Up to 2 hours before the procedure - you may continue to drink clear liquids, such as water, clear fruit juice, black coffee, and plain tea.  Eating and drinking restrictions Follow instructions from your health care provider  about eating and drinking, which may include:  8 hours before the procedure - stop eating heavy meals or foods such as meat, fried foods, or fatty foods.  6 hours before the procedure - stop eating light meals or foods, such as toast or cereal.  6 hours before the procedure - stop drinking milk or drinks that contain milk.  2 hours before the procedure - stop drinking clear liquids.  Medicines Ask your health care provider about:  Changing or stopping your regular medicines. This is especially important if you are taking diabetes medicines or blood thinners.  Taking medicines such as aspirin and ibuprofen. These medicines can thin your blood. Do not take these medicines before  your procedure if your health care provider instructs you not to.  Tests and exams  You will have a physical exam.  You may have blood tests done to show: ? How well your kidneys and liver are working. ? How well your blood can clot.  General instructions  Plan to have someone take you home from the hospital or clinic.  If you will be going home right after the procedure, plan to have someone with you for 24 hours.  What happens during the procedure?  Your blood pressure, heart rate, breathing, level of pain and overall condition will be monitored.  An IV tube will be inserted into one of your veins.  Your anesthesia specialist will give you medicines as needed to keep you comfortable during the procedure. This may mean changing the level of sedation.  The procedure will be performed. After the procedure  Your blood pressure, heart rate, breathing rate, and blood oxygen level will be monitored until the medicines you were given have worn off.  Do not drive for 24 hours if you received a sedative.  You may: ? Feel sleepy, clumsy, or nauseous. ? Feel forgetful about what happened after the procedure. ? Have a sore throat if you had a breathing tube during the procedure. ? Vomit. This information  is not intended to replace advice given to you by your health care provider. Make sure you discuss any questions you have with your health care provider. Document Released: 11/07/2004 Document Revised: 07/21/2015 Document Reviewed: 06/04/2015 Elsevier Interactive Patient Education  2018 [place], [place] After These instructions provide you with information about caring for yourself after your procedure. Your health care provider may also give you more specific instructions. Your treatment has been planned according to current medical practices, but problems sometimes occur. Call your health care provider if you have any problems or questions after your procedure. What can I expect after the procedure? After your procedure, it is common to:  Feel sleepy for several hours.  Feel clumsy and have poor balance for several hours.  Feel forgetful about what happened after the procedure.  Have poor judgment for several hours.  Feel nauseous or vomit.  Have a sore throat if you had a breathing tube during the procedure.  Follow these instructions at home: For at least 24 hours after the procedure:   Do not: ? Participate in activities in which you could fall or become injured. ? Drive. ? Use heavy machinery. ? Drink alcohol. ? Take sleeping pills or medicines that cause drowsiness. ? Make important decisions or sign legal documents. ? Take care of children on your own.  Rest. Eating and drinking  Follow the diet that is recommended by your health care provider.  If you vomit, drink water, juice, or soup when you can drink without vomiting.  Make sure you have little or no nausea before eating solid foods. General instructions  Have a responsible adult stay with you until you are awake and alert.  Take over-the-counter and prescription medicines only as told by your health care provider.  If you smoke, do not smoke without supervision.  Keep all  follow-up visits as told by your health care provider. This is important. Contact a health care provider if:  You keep feeling nauseous or you keep vomiting.  You feel light-headed.  You develop a rash.  You have a fever. Get help right away if:  You have trouble breathing. This information is not intended to  replace advice given to you by your health care provider. Make sure you discuss any questions you have with your health care provider. Document Released: 06/04/2015 Document Revised: 10/04/2015 Document Reviewed: 06/04/2015 Elsevier Interactive Patient Education  [employer].

## 2017-10-21 ENCOUNTER — Encounter (HOSPITAL_COMMUNITY)
Admission: RE | Admit: 2017-10-21 | Discharge: 2017-10-21 | Disposition: A | Discharge status: Home / Self Care | Payer: Medicare Other | Source: Ambulatory Visit | Attending: Orthopedic Surgery | Admitting: Orthopedic Surgery

## 2017-10-21 ENCOUNTER — Other Ambulatory Visit (HOSPITAL_COMMUNITY): Payer: PRIVATE HEALTH INSURANCE

## 2017-10-21 ENCOUNTER — Other Ambulatory Visit: Payer: Self-pay

## 2017-10-21 ENCOUNTER — Encounter (HOSPITAL_COMMUNITY): Payer: Self-pay

## 2017-10-21 DIAGNOSIS — Z961 Presence of intraocular lens: Secondary | ICD-10-CM | POA: Diagnosis not present

## 2017-10-21 DIAGNOSIS — Z91018 Allergy to other foods: Secondary | ICD-10-CM | POA: Diagnosis not present

## 2017-10-21 DIAGNOSIS — Z7982 Long term (current) use of aspirin: Secondary | ICD-10-CM | POA: Diagnosis not present

## 2017-10-21 DIAGNOSIS — Z7984 Long term (current) use of oral hypoglycemic drugs: Secondary | ICD-10-CM | POA: Diagnosis not present

## 2017-10-21 DIAGNOSIS — Z86010 Personal history of colonic polyps: Secondary | ICD-10-CM | POA: Diagnosis not present

## 2017-10-21 DIAGNOSIS — M1990 Unspecified osteoarthritis, unspecified site: Secondary | ICD-10-CM | POA: Diagnosis not present

## 2017-10-21 DIAGNOSIS — Z7952 Long term (current) use of systemic steroids: Secondary | ICD-10-CM | POA: Diagnosis not present

## 2017-10-21 DIAGNOSIS — Z8711 Personal history of peptic ulcer disease: Secondary | ICD-10-CM | POA: Diagnosis not present

## 2017-10-21 DIAGNOSIS — Z9841 Cataract extraction status, right eye: Secondary | ICD-10-CM | POA: Diagnosis not present

## 2017-10-21 DIAGNOSIS — J449 Chronic obstructive pulmonary disease, unspecified: Secondary | ICD-10-CM | POA: Diagnosis not present

## 2017-10-21 DIAGNOSIS — Z6838 Body mass index (BMI) 38.0-38.9, adult: Secondary | ICD-10-CM | POA: Diagnosis not present

## 2017-10-21 DIAGNOSIS — M8580 Other specified disorders of bone density and structure, unspecified site: Secondary | ICD-10-CM | POA: Diagnosis not present

## 2017-10-21 DIAGNOSIS — Z87891 Personal history of nicotine dependence: Secondary | ICD-10-CM | POA: Diagnosis not present

## 2017-10-21 DIAGNOSIS — E1140 Type 2 diabetes mellitus with diabetic neuropathy, unspecified: Secondary | ICD-10-CM | POA: Diagnosis not present

## 2017-10-21 DIAGNOSIS — G5603 Carpal tunnel syndrome, bilateral upper limbs: Secondary | ICD-10-CM | POA: Diagnosis not present

## 2017-10-21 DIAGNOSIS — E785 Hyperlipidemia, unspecified: Secondary | ICD-10-CM | POA: Diagnosis not present

## 2017-10-21 DIAGNOSIS — Z79899 Other long term (current) drug therapy: Secondary | ICD-10-CM | POA: Diagnosis not present

## 2017-10-21 DIAGNOSIS — K219 Gastro-esophageal reflux disease without esophagitis: Secondary | ICD-10-CM | POA: Diagnosis not present

## 2017-10-21 DIAGNOSIS — E669 Obesity, unspecified: Secondary | ICD-10-CM | POA: Diagnosis not present

## 2017-10-21 DIAGNOSIS — I10 Essential (primary) hypertension: Secondary | ICD-10-CM | POA: Diagnosis not present

## 2017-10-21 DIAGNOSIS — Z9842 Cataract extraction status, left eye: Secondary | ICD-10-CM | POA: Diagnosis not present

## 2017-10-21 HISTORY — DX: Stress incontinence (female) (male): N39.3

## 2017-10-21 HISTORY — DX: Unspecified macular degeneration: H35.30

## 2017-10-21 LAB — BASIC METABOLIC PANEL
Anion gap: 8 (ref 5–15)
BUN: 17 mg/dL (ref 8–23)
CHLORIDE: 104 mmol/L (ref 98–111)
CO2: 25 mmol/L (ref 22–32)
CREATININE: 0.7 mg/dL (ref 0.44–1.00)
Calcium: 11 mg/dL — ABNORMAL HIGH (ref 8.9–10.3)
GFR calc Af Amer: >60 mL/min (ref >60)
GFR calc non Af Amer: >60 mL/min (ref >60)
Glucose, Bld: 149 mg/dL — ABNORMAL HIGH (ref 70–99)
POTASSIUM: 4.4 mmol/L (ref 3.5–5.1)
Sodium: 137 mmol/L (ref 135–145)

## 2017-10-21 LAB — CBC WITH DIFFERENTIAL/PLATELET
Basophils Absolute: 0 10*3/uL (ref 0.0–0.1)
Basophils Relative: 0 %
EOS ABS: 0.6 10*3/uL (ref 0.0–0.7)
Eosinophils Relative: 5 %
HEMATOCRIT: 39.9 % (ref 36.0–46.0)
Hemoglobin: 13.2 g/dL (ref 12.0–15.0)
LYMPHS ABS: 2.9 10*3/uL (ref 0.7–4.0)
LYMPHS PCT: 28 %
MCH: 29.5 pg (ref 26.0–34.0)
MCHC: 33.1 g/dL (ref 30.0–36.0)
MCV: 89.3 fL (ref 78.0–100.0)
MONOS PCT: 10 %
Monocytes Absolute: 1 10*3/uL (ref 0.1–1.0)
Neutro Abs: 6 10*3/uL (ref 1.7–7.7)
Neutrophils Relative %: 57 %
Platelets: 194 10*3/uL (ref 150–400)
RBC: 4.47 MIL/uL (ref 3.87–5.11)
RDW: 13.5 % (ref 11.5–15.5)
WBC: 10.4 10*3/uL (ref 4.0–10.5)

## 2017-10-21 LAB — HEMOGLOBIN A1C
Hgb A1c MFr Bld: 7.5 % — ABNORMAL HIGH (ref 4.8–5.6)
Mean Plasma Glucose: 168.55 mg/dL

## 2017-10-22 NOTE — H&P (Signed)
Chief Complaint  Patient presents with  . Carpal Tunnel      CTS, referred by Dr. Merlene Laughter for surgery.      HPI Michaela Moon is a 82 y.o. female.  Presents for evaluation of bilateral carpal tunnel syndrome diagnosed with nerve conduction studies   She complains of bilateral hand pain with numbness and tingling severe radiates from the hand up into the forearm associated with decreased ability to hold onto small objects and do fine motor tasks   Symptoms have been present for over 6 months   She has a positive history of diabetes with a hemoglobin A1c of 7.8   Review of Systems Review of Systems  Constitutional: Negative for chills.  Respiratory: Positive for shortness of breath.   Musculoskeletal: Positive for arthralgias.  Neurological: Positive for weakness and numbness.            Past Medical History:  Diagnosis Date  . Arthritis    . Asthma    . COPD (chronic obstructive pulmonary disease) ([place])    . Diabetes mellitus without complication ([place])    . GERD (gastroesophageal reflux disease)    . Headache    . Hyperlipidemia    . Hypertension    . Neuropathy    . Obesity    . Osteopenia             Past Surgical History:  Procedure Laterality Date  . BREAST LUMPECTOMY        no cancer  . CATARACT EXTRACTION W/PHACO Left 11/01/2016    Procedure: CATARACT EXTRACTION PHACO AND INTRAOCULAR LENS PLACEMENT (IOC);  Surgeon: Baruch Goldmann, MD;  Location: [facility];  Service: Ophthalmology;  Laterality: Left;  CDE: 3.83  . CATARACT EXTRACTION W/PHACO Right 11/29/2016    Procedure: CATARACT EXTRACTION PHACO AND INTRAOCULAR LENS PLACEMENT RIGHT EYE;  Surgeon: Baruch Goldmann, MD;  Location: [facility];  Service: Ophthalmology;  Laterality: Right;  CDE: 8.71  . colon tumor removed   2010    Dr. 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 COLONOSCOPY   05/2013    Dr. Anthony Sar: normal. (h/o colon polyps)  . COLONOSCOPY    2011    normal  . COLONOSCOPY   10/2003    Dr. Lindalou Hose: large polypoid mass hepatic fluexure  . ESOPHAGOGASTRODUODENOSCOPY N/A 04/29/2016    Procedure: ESOPHAGOGASTRODUODENOSCOPY (EGD);  Surgeon: Danie Binder, MD;  Location: [facility];  Service: Endoscopy;  Laterality: N/A;  9:15am  . SAVORY DILATION N/A 04/29/2016    Procedure: SAVORY DILATION;  Surgeon: Danie Binder, MD;  Location: [facility];  Service: Endoscopy;  Laterality: N/A;           Family History  Problem Relation Age of Onset  . Melanoma Brother    . Colon cancer Neg Hx        Social History Social History         Tobacco Use  . Smoking status: Former Smoker      Packs/day: 2.00      Years: 20.00      Pack years: 40.00      Types: Cigarettes      Last attempt to quit: 02/25/2002      Years since quitting: 15.6  . Smokeless tobacco: Never Used  Substance Use Topics  . Alcohol use: No  . Drug use: No           Allergies  Allergen Reactions  .  Other        Onions make her wheeze            Current Outpatient Medications  Medication Sig Dispense Refill  . albuterol-ipratropium (COMBIVENT) 18-103 MCG/ACT inhaler Inhale 2 puffs into the lungs every 6 (six) hours as needed for wheezing. 14.7 g 2  . amLODipine (NORVASC) 5 MG tablet Take 5 mg by mouth daily.      Marland Kitchen aspirin 81 MG tablet Take 81 mg by mouth daily as needed (blood flow).       Marland Kitchen atorvastatin (LIPITOR) 80 MG tablet ALTERNATE TAKING 1 TABLET EVERY OTHER DAY WITH 1/2 TABLET EVERY OTHERDAY (Patient taking differently: ALTERNATE TAKING 1 TABLET EVERY OTHER DAY WITH 1/2 TABLET EVERY OTHER DAY) 22 tablet 0  . cholecalciferol (VITAMIN D) 1000 units tablet Take 1,000 Units by mouth daily.      Marland Kitchen gabapentin (NEURONTIN) 400 MG capsule Take 400 mg by mouth 3 (three) times daily.      Marland Kitchen glimepiride (AMARYL) 2 MG tablet Take 2 mg by mouth daily with breakfast.      . losartan-hydrochlorothiazide (HYZAAR) 100-12.5 MG per tablet TAKE ONE (1) TABLET EACH  DAY 30 tablet 0  . meloxicam (MOBIC) 7.5 MG tablet Take 7.5 mg by mouth daily.      . metFORMIN (GLUCOPHAGE) 1000 MG tablet TAKE ONE TABLET TWICE A DAY WITH FOOD 60 tablet 5  . metoprolol (LOPRESSOR) 50 MG tablet Take 50 mg by mouth 2 (two) times daily.      . Omega-3 Fatty Acids (FISH OIL) 1000 MG CAPS Take 1 capsule by mouth 3 (three) times daily.      Marland Kitchen oxybutynin (DITROPAN) 5 MG tablet Take 5 mg by mouth 3 (three) times daily.      . prednisoLONE acetate (PRED FORTE) 1 % ophthalmic suspension 1 drop 3 (three) times daily.      . ranitidine (ZANTAC) 150 MG tablet Take 300 mg by mouth 2 (two) times daily.      . sitaGLIPtin (JANUVIA) 100 MG tablet Take 1 tablet (100 mg total) by mouth daily. 30 tablet 5    No current facility-administered medications for this visit.       BP (!) 143/74   Pulse 68   Ht 5\' 1"  (1.549 m)   Wt 201 lb (91.2 kg)   BMI 37.98 kg/m    Physical Exam Blood pressure (!) 143/74, pulse 68, height 5\' 1"  (1.549 m), weight 201 lb (91.2 kg). Physical Exam The patient is well developed well nourished and well groomed.  Orientation to person place and time is normal  Mood is pleasant.   Ambulatory status normal gait and stance    Right and left upper extremity upper extremity examination reveals the following:   Inspection reveals no swelling. There is tenderness over the carpal tunnel.  There is tenderness over the ulnocarpal joint bilaterally and radiocarpal joint bilaterally with swelling     Range of motion of the wrist and elbow are normal   Motor exam shows mild weakness with grip strength.   Wrist joint is stable   Provocative tests for carpal tunnel Phalen's test positive bilateral at 10 to 15 seconds Carpal tunnel compression test positive bilaterally Tinel's test positive left negative right   Pulses are normal in the radial and ulnar artery with a normal Allen's test.   Decreased sensation is noted in the median nerve distribution. Soft touch  is normal.     Plan  MEDICAL DECISION SECTION  xrays ordered?  No   My independent reading of xrays: Not applicable         Encounter Diagnosis  Name Primary?  . Cubital tunnel syndrome, bilateral Yes        PLAN: Left carpal tunnel release   No orders of the defined types were placed in this encounter.   Injection?  No MRI/CT/?  No   The patient would like to proceed with surgical intervention on the left wrist/hand for carpal tunnel release with the understanding that symptoms can come back especially for carpal tunnel is not controlled and she also has a risk factor of smoking   The procedure has been fully reviewed with the patient; The risks and benefits of surgery have been discussed and explained and understood. Alternative treatment has also been reviewed, questions were encouraged and answered. The postoperative plan is also been reviewed.

## 2017-10-23 ENCOUNTER — Ambulatory Visit (HOSPITAL_COMMUNITY): Payer: Medicare Other | Admitting: Anesthesiology

## 2017-10-23 ENCOUNTER — Encounter (HOSPITAL_COMMUNITY)
Admission: RE | Disposition: A | Discharge status: Home / Self Care | Payer: Self-pay | Source: Ambulatory Visit | Attending: Orthopedic Surgery

## 2017-10-23 ENCOUNTER — Encounter (HOSPITAL_COMMUNITY): Payer: Self-pay | Admitting: Anesthesiology

## 2017-10-23 ENCOUNTER — Ambulatory Visit (HOSPITAL_COMMUNITY)
Admission: RE | Admit: 2017-10-23 | Discharge: 2017-10-23 | Disposition: A | Discharge status: Home / Self Care | Payer: Medicare Other | Source: Ambulatory Visit | Attending: Orthopedic Surgery | Admitting: Orthopedic Surgery

## 2017-10-23 DIAGNOSIS — Z961 Presence of intraocular lens: Secondary | ICD-10-CM | POA: Insufficient documentation

## 2017-10-23 DIAGNOSIS — Z7952 Long term (current) use of systemic steroids: Secondary | ICD-10-CM | POA: Diagnosis not present

## 2017-10-23 DIAGNOSIS — G5603 Carpal tunnel syndrome, bilateral upper limbs: Secondary | ICD-10-CM | POA: Diagnosis not present

## 2017-10-23 DIAGNOSIS — Z79899 Other long term (current) drug therapy: Secondary | ICD-10-CM | POA: Insufficient documentation

## 2017-10-23 DIAGNOSIS — E1140 Type 2 diabetes mellitus with diabetic neuropathy, unspecified: Secondary | ICD-10-CM | POA: Diagnosis not present

## 2017-10-23 DIAGNOSIS — Z6838 Body mass index (BMI) 38.0-38.9, adult: Secondary | ICD-10-CM | POA: Insufficient documentation

## 2017-10-23 DIAGNOSIS — Z91018 Allergy to other foods: Secondary | ICD-10-CM | POA: Insufficient documentation

## 2017-10-23 DIAGNOSIS — Z86010 Personal history of colonic polyps: Secondary | ICD-10-CM | POA: Insufficient documentation

## 2017-10-23 DIAGNOSIS — M1990 Unspecified osteoarthritis, unspecified site: Secondary | ICD-10-CM | POA: Insufficient documentation

## 2017-10-23 DIAGNOSIS — I10 Essential (primary) hypertension: Secondary | ICD-10-CM | POA: Insufficient documentation

## 2017-10-23 DIAGNOSIS — G5602 Carpal tunnel syndrome, left upper limb: Secondary | ICD-10-CM | POA: Diagnosis not present

## 2017-10-23 DIAGNOSIS — Z87891 Personal history of nicotine dependence: Secondary | ICD-10-CM | POA: Diagnosis not present

## 2017-10-23 DIAGNOSIS — E669 Obesity, unspecified: Secondary | ICD-10-CM | POA: Insufficient documentation

## 2017-10-23 DIAGNOSIS — K219 Gastro-esophageal reflux disease without esophagitis: Secondary | ICD-10-CM | POA: Insufficient documentation

## 2017-10-23 DIAGNOSIS — M8580 Other specified disorders of bone density and structure, unspecified site: Secondary | ICD-10-CM | POA: Insufficient documentation

## 2017-10-23 DIAGNOSIS — Z9842 Cataract extraction status, left eye: Secondary | ICD-10-CM | POA: Insufficient documentation

## 2017-10-23 DIAGNOSIS — J449 Chronic obstructive pulmonary disease, unspecified: Secondary | ICD-10-CM | POA: Diagnosis not present

## 2017-10-23 DIAGNOSIS — Z8711 Personal history of peptic ulcer disease: Secondary | ICD-10-CM | POA: Insufficient documentation

## 2017-10-23 DIAGNOSIS — E785 Hyperlipidemia, unspecified: Secondary | ICD-10-CM | POA: Insufficient documentation

## 2017-10-23 DIAGNOSIS — Z9889 Other specified postprocedural states: Secondary | ICD-10-CM

## 2017-10-23 DIAGNOSIS — Z7984 Long term (current) use of oral hypoglycemic drugs: Secondary | ICD-10-CM | POA: Insufficient documentation

## 2017-10-23 DIAGNOSIS — Z7982 Long term (current) use of aspirin: Secondary | ICD-10-CM | POA: Insufficient documentation

## 2017-10-23 DIAGNOSIS — Z9841 Cataract extraction status, right eye: Secondary | ICD-10-CM | POA: Insufficient documentation

## 2017-10-23 HISTORY — PX: CARPAL TUNNEL RELEASE: SHX101

## 2017-10-23 LAB — GLUCOSE, CAPILLARY
Glucose-Capillary: 182 mg/dL — ABNORMAL HIGH (ref 70–99)
Glucose-Capillary: 182 mg/dL — ABNORMAL HIGH (ref 70–99)

## 2017-10-23 SURGERY — CARPAL TUNNEL RELEASE
Anesthesia: Regional | Laterality: Left

## 2017-10-23 MED ORDER — FENTANYL CITRATE (PF) 100 MCG/2ML IJ SOLN
INTRAMUSCULAR | Status: AC
  Filled 2017-10-23: qty 2

## 2017-10-23 MED ORDER — LIDOCAINE HCL (PF) 0.5 % IJ SOLN
INTRAMUSCULAR | Status: AC
  Filled 2017-10-23: qty 50

## 2017-10-23 MED ORDER — BUPIVACAINE HCL (PF) 0.5 % IJ SOLN
INTRAMUSCULAR | Status: DC | PRN
  Administered 2017-10-23: 10 mL

## 2017-10-23 MED ORDER — CEFAZOLIN SODIUM-DEXTROSE 2-4 GM/100ML-% IV SOLN
2.0000 g | INTRAVENOUS | Status: AC
  Administered 2017-10-23: 2 g via INTRAVENOUS
  Filled 2017-10-23: qty 100

## 2017-10-23 MED ORDER — PROPOFOL 10 MG/ML IV BOLUS
INTRAVENOUS | Status: DC | PRN
  Administered 2017-10-23 (×2): 10 mg via INTRAVENOUS

## 2017-10-23 MED ORDER — ACETAMINOPHEN-CODEINE #3 300-30 MG PO TABS: 1.0000 | ORAL_TABLET | Freq: Four times a day (QID) | ORAL | 0 refills | Status: DC | PRN

## 2017-10-23 MED ORDER — 0.9 % SODIUM CHLORIDE (POUR BTL) OPTIME
TOPICAL | Status: DC | PRN
  Administered 2017-10-23: 1000 mL

## 2017-10-23 MED ORDER — CHLORHEXIDINE GLUCONATE 4 % EX LIQD: 60.0000 mL | Freq: Once | CUTANEOUS | Status: DC

## 2017-10-23 MED ORDER — LIDOCAINE IN DEXTROSE 5-7.5 % INTRASPINAL SOLN
INTRASPINAL | Status: DC | PRN
  Administered 2017-10-23: 50 mL via INTRATHECAL

## 2017-10-23 MED ORDER — PROPOFOL 500 MG/50ML IV EMUL
INTRAVENOUS | Status: DC | PRN
  Administered 2017-10-23: 75 ug/kg/min via INTRAVENOUS

## 2017-10-23 MED ORDER — FENTANYL CITRATE (PF) 100 MCG/2ML IJ SOLN: 25.0000 ug | INTRAMUSCULAR | Status: DC | PRN

## 2017-10-23 MED ORDER — HYDROCODONE-ACETAMINOPHEN 7.5-325 MG PO TABS: 1.0000 | ORAL_TABLET | Freq: Once | ORAL | Status: DC | PRN

## 2017-10-23 MED ORDER — FENTANYL CITRATE (PF) 100 MCG/2ML IJ SOLN
INTRAMUSCULAR | Status: DC | PRN
  Administered 2017-10-23 (×4): 25 ug via INTRAVENOUS

## 2017-10-23 MED ORDER — LACTATED RINGERS IV SOLN
INTRAVENOUS | Status: DC
  Administered 2017-10-23: 11:00:00 via INTRAVENOUS

## 2017-10-23 MED ORDER — BUPIVACAINE HCL (PF) 0.5 % IJ SOLN
INTRAMUSCULAR | Status: AC
  Filled 2017-10-23: qty 30

## 2017-10-23 SURGICAL SUPPLY — 37 items
BANDAGE ELASTIC 3 LF NS (GAUZE/BANDAGES/DRESSINGS) ×3 IMPLANT
BANDAGE ESMARK 4X12 BL STRL LF (DISPOSABLE) ×1 IMPLANT
BLADE SURG 15 STRL LF DISP TIS (BLADE) ×1 IMPLANT
BLADE SURG 15 STRL SS (BLADE) ×2
BNDG COHESIVE 4X5 TAN STRL (GAUZE/BANDAGES/DRESSINGS) ×3 IMPLANT
BNDG ESMARK 4X12 BLUE STRL LF (DISPOSABLE) ×3
BNDG GAUZE ELAST 4 BULKY (GAUZE/BANDAGES/DRESSINGS) ×3 IMPLANT
CHLORAPREP W/TINT 26ML (MISCELLANEOUS) ×3 IMPLANT
CLOTH BEACON ORANGE TIMEOUT ST (SAFETY) ×3 IMPLANT
COVER LIGHT HANDLE STERIS (MISCELLANEOUS) ×6 IMPLANT
CUFF TOURNIQUET SINGLE 18IN (TOURNIQUET CUFF) ×3 IMPLANT
DECANTER SPIKE VIAL GLASS SM (MISCELLANEOUS) ×3 IMPLANT
DRAPE PROXIMA HALF (DRAPES) ×3 IMPLANT
DRSG XEROFORM 1X8 (GAUZE/BANDAGES/DRESSINGS) ×3 IMPLANT
ELECT NEEDLE TIP 2.8 STRL (NEEDLE) ×3 IMPLANT
ELECT REM PT RETURN 9FT ADLT (ELECTROSURGICAL) ×3
ELECTRODE REM PT RTRN 9FT ADLT (ELECTROSURGICAL) ×1 IMPLANT
GAUZE SPONGE 4X4 12PLY STRL (GAUZE/BANDAGES/DRESSINGS) ×3 IMPLANT
GLOVE BIOGEL M 7.0 STRL (GLOVE) ×3 IMPLANT
GLOVE BIOGEL PI IND STRL 7.0 (GLOVE) ×1 IMPLANT
GLOVE BIOGEL PI INDICATOR 7.0 (GLOVE) ×2
GLOVE SKINSENSE NS SZ8.0 LF (GLOVE) ×2
GLOVE SKINSENSE STRL SZ8.0 LF (GLOVE) ×1 IMPLANT
GLOVE SS N UNI LF 8.5 STRL (GLOVE) ×3 IMPLANT
GOWN STRL REUS W/ TWL LRG LVL3 (GOWN DISPOSABLE) ×1 IMPLANT
GOWN STRL REUS W/TWL LRG LVL3 (GOWN DISPOSABLE) ×2
GOWN STRL REUS W/TWL XL LVL3 (GOWN DISPOSABLE) ×3 IMPLANT
KIT TURNOVER KIT A (KITS) ×3 IMPLANT
MANIFOLD NEPTUNE II (INSTRUMENTS) ×3 IMPLANT
NEEDLE HYPO 21X1.5 SAFETY (NEEDLE) ×3 IMPLANT
NS IRRIG 1000ML POUR BTL (IV SOLUTION) ×3 IMPLANT
PACK BASIC LIMB (CUSTOM PROCEDURE TRAY) ×3 IMPLANT
PAD ARMBOARD 7.5X6 YLW CONV (MISCELLANEOUS) ×3 IMPLANT
POSITIONER HAND ALUMI XLG (MISCELLANEOUS) ×3 IMPLANT
SET BASIN LINEN APH (SET/KITS/TRAYS/PACK) ×3 IMPLANT
SUT ETHILON 3 0 FSL (SUTURE) ×3 IMPLANT
SYR CONTROL 10ML LL (SYRINGE) ×3 IMPLANT

## 2017-10-23 NOTE — Op Note (Signed)
10/23/2017  12:52 PM  PATIENT:  Michaela Moon  82 y.o. female  PRE-OPERATIVE DIAGNOSIS:  left carpal tunnel syndrome  POST-OPERATIVE DIAGNOSIS:  left carpal tunnel syndrome  PROCEDURE:  Procedure(s): LEFT CARPAL TUNNEL RELEASE (Left)   FINDINGS: Stenosis of the carpal tunnel discoloration of the median nerve apple core appearance at the site of transverse carpal ligament  Carpal tunnel release left wrist  Preop diagnosis carpal tunnel syndrome left wrist   postop diagnosis same  Procedure open carpal tunnel release left wrist Surgeon Aline Brochure  Anesthesia regional Bier block  Operative findings compression of the left median nerve without any anterior carpal tunnel masses   Indications failure of conservative treatment to relieve pain and paresthesias and numbness and tingling of the left hand  The patient was identified in the preop area we confirm the surgical site marked as left wrist. Chart update completed. Patient taken to surgery. She had 2 g of Ancef. After establishing a Bier block left her arm was prepped with ChloraPrep.  Timeout executed completed and confirmed site.  A straight incision was made over the left carpal tunnel in line with the radial border of the ring finger. Blunt dissection was carried out to find the distal aspect of the carpal tunnel. A blunted instrument was passed beneath the carpal tunnel. Sharp incision was then used to release the transverse carpal ligament. The contents of the carpal tunnel were inspected. The median nerve was compressed with slight discoloration.  The wound was irrigated and then closed with 3-0 nylon suture. We injected 10 mL of plain Marcaine on the radial side of the incision  A sterile bandage was applied and the tourniquet was released the color of the hand and capillary refill were normal  The patient was taken to the recovery room in stable condition  SURGEON:  Surgeon(s) and Role:    * Carole Civil, MD -  Primary  PHYSICIAN ASSISTANT:   ASSISTANTS: none   ANESTHESIA:   regional  EBL:  0 mL   BLOOD ADMINISTERED:none  DRAINS: none   LOCAL MEDICATIONS USED:  MARCAINE     SPECIMEN:  No Specimen  DISPOSITION OF SPECIMEN:  N/A  COUNTS:  YES  TOURNIQUET:   Total Tourniquet Time Documented: Upper Arm (Left) - 27 minutes Total: Upper Arm (Left) - 27 minutes   DICTATION: .Viviann Spare Dictation  PLAN OF CARE: Discharge to home after PACU  PATIENT DISPOSITION:  PACU - hemodynamically stable.   Delay start of Pharmacological VTE agent (>24hrs) due to surgical blood loss or risk of bleeding: NO

## 2017-10-23 NOTE — Anesthesia Postprocedure Evaluation (Signed)
Anesthesia Post Note  Patient: Michaela Moon  Procedure(s) Performed: LEFT CARPAL TUNNEL RELEASE (Left )  Patient location during evaluation: PACU Anesthesia Type: Bier Block Level of consciousness: awake and alert and oriented Pain management: pain level controlled Vital Signs Assessment: post-procedure vital signs reviewed and stable Respiratory status: spontaneous breathing Cardiovascular status: stable and blood pressure returned to baseline Postop Assessment: no apparent nausea or vomiting Anesthetic complications: no     Last Vitals:  Vitals:   10/23/17 1047 10/23/17 1256  BP: (!) 183/86 114/64  Pulse: 83 69  Resp: 20 14  Temp: 36.7 C 37.2 C  SpO2: 96% 92%    Last Pain:  Vitals:   10/23/17 1047  TempSrc: Oral  PainSc: 0-No pain                 Lj Miyamoto

## 2017-10-23 NOTE — Brief Op Note (Signed)
10/23/2017  12:52 PM  PATIENT:  Michaela Moon  82 y.o. female  PRE-OPERATIVE DIAGNOSIS:  left carpal tunnel syndrome  POST-OPERATIVE DIAGNOSIS:  left carpal tunnel syndrome  PROCEDURE:  Procedure(s): LEFT CARPAL TUNNEL RELEASE (Left)   FINDINGS: Stenosis of the carpal tunnel discoloration of the median nerve apple core appearance at the site of transverse carpal ligament  Carpal tunnel release left wrist  Preop diagnosis carpal tunnel syndrome left wrist   postop diagnosis same  Procedure open carpal tunnel release left wrist Surgeon Aline Brochure  Anesthesia regional Bier block  Operative findings compression of the left median nerve without any anterior carpal tunnel masses   Indications failure of conservative treatment to relieve pain and paresthesias and numbness and tingling of the left hand  The patient was identified in the preop area we confirm the surgical site marked as left wrist. Chart update completed. Patient taken to surgery. She had 2 g of Ancef. After establishing a Bier block left her arm was prepped with ChloraPrep.  Timeout executed completed and confirmed site.  A straight incision was made over the left carpal tunnel in line with the radial border of the ring finger. Blunt dissection was carried out to find the distal aspect of the carpal tunnel. A blunted instrument was passed beneath the carpal tunnel. Sharp incision was then used to release the transverse carpal ligament. The contents of the carpal tunnel were inspected. The median nerve was compressed with slight discoloration.  The wound was irrigated and then closed with 3-0 nylon suture. We injected 10 mL of plain Marcaine on the radial side of the incision  A sterile bandage was applied and the tourniquet was released the color of the hand and capillary refill were normal  The patient was taken to the recovery room in stable condition  SURGEON:  Surgeon(s) and Role:    * Carole Civil, MD -  Primary  PHYSICIAN ASSISTANT:   ASSISTANTS: none   ANESTHESIA:   regional  EBL:  0 mL   BLOOD ADMINISTERED:none  DRAINS: none   LOCAL MEDICATIONS USED:  MARCAINE     SPECIMEN:  No Specimen  DISPOSITION OF SPECIMEN:  N/A  COUNTS:  YES  TOURNIQUET:   Total Tourniquet Time Documented: Upper Arm (Left) - 27 minutes Total: Upper Arm (Left) - 27 minutes   DICTATION: .Viviann Spare Dictation  PLAN OF CARE: Discharge to home after PACU  PATIENT DISPOSITION:  PACU - hemodynamically stable.   Delay start of Pharmacological VTE agent (>24hrs) due to surgical blood loss or risk of bleeding: NO

## 2017-10-23 NOTE — Discharge Instructions (Signed)
I have prescribed a pain medication for you to take for 3 to 4 days after that you can start taking tramadol again

## 2017-10-23 NOTE — Anesthesia Postprocedure Evaluation (Signed)
Anesthesia Post Note  Patient: Michaela Moon  Procedure(s) Performed: LEFT CARPAL TUNNEL RELEASE (Left )  Patient location during evaluation: PACU Anesthesia Type: Bier Block Level of consciousness: awake and alert and oriented Pain management: pain level controlled Vital Signs Assessment: post-procedure vital signs reviewed and stable Respiratory status: spontaneous breathing Cardiovascular status: stable Postop Assessment: no apparent nausea or vomiting Anesthetic complications: no     Last Vitals:  Vitals:   10/23/17 1047 10/23/17 1256  BP: (!) 183/86 114/64  Pulse: 83 69  Resp: 20 14  Temp: 36.7 C 37.2 C  SpO2: 96% 92%    Last Pain:  Vitals:   10/23/17 1047  TempSrc: Oral  PainSc: 0-No pain                 Kieth Hartis

## 2017-10-23 NOTE — Anesthesia Preprocedure Evaluation (Addendum)
Anesthesia Evaluation  Patient identified by MRN, date of birth, ID band Patient awake    Reviewed: Allergy & Precautions, NPO status , Patient's Chart, lab work & pertinent test results  Airway Mallampati: II  TM Distance: >3 FB Neck ROM: Full    Dental no notable dental hx.    Pulmonary neg pulmonary ROS, asthma , pneumonia, resolved, COPD,  COPD inhaler, former smoker,  Uses nebs and combivent as needed  States pulm better after quit smoking recently    Pulmonary exam normal breath sounds clear to auscultation       Cardiovascular Exercise Tolerance: Poor hypertension, Pt. on medications and Pt. on home beta blockers negative cardio ROS Normal cardiovascular examII Rhythm:Regular Rate:Normal  Appears slightly weak  Denies CP   Neuro/Psych  Headaches,  Neuromuscular disease negative neurological ROS  negative psych ROS   GI/Hepatic negative GI ROS, Neg liver ROS, PUD, GERD  Medicated and Controlled,  Endo/Other  negative endocrine ROSdiabetes, Well Controlled, Type 2  Renal/GU negative Renal ROS  negative genitourinary   Musculoskeletal negative musculoskeletal ROS (+) Arthritis , Osteoarthritis,    Abdominal   Peds negative pediatric ROS (+)  Hematology negative hematology ROS (+)   Anesthesia Other Findings   Reproductive/Obstetrics negative OB ROS                            Anesthesia Physical Anesthesia Plan  ASA: II  Anesthesia Plan: Bier Block and Bier Block-LIDOCAINE ONLY   Post-op Pain Management:    Induction: Intravenous  PONV Risk Score and Plan:   Airway Management Planned: Simple Face Mask and Nasal Cannula  Additional Equipment:   Intra-op Plan:   Post-operative Plan:   Informed Consent: I have reviewed the patients History and Physical, chart, labs and discussed the procedure including the risks, benefits and alternatives for the proposed anesthesia with  the patient or authorized representative who has indicated his/her understanding and acceptance.   Dental advisory given  Plan Discussed with: CRNA  Anesthesia Plan Comments: (General as needed  OK to proceed with Bier block)        Anesthesia Quick Evaluation

## 2017-10-23 NOTE — Interval H&P Note (Signed)
History and Physical Interval Note:  10/23/2017 10:40 AM  Michaela Moon  has presented today for surgery, with the diagnosis of left carpal tunnel syndrome  The various methods of treatment have been discussed with the patient and family. After consideration of risks, benefits and other options for treatment, the patient has consented to  Procedure(s): LEFT CARPAL TUNNEL RELEASE (Left) as a surgical intervention .  The patient's history has been reviewed, patient examined, no change in status, stable for surgery.  I have reviewed the patient's chart and labs.  Questions were answered to the patient's satisfaction.     Arther Abbott

## 2017-10-23 NOTE — Transfer of Care (Signed)
Immediate Anesthesia Transfer of Care Note  Patient: Michaela Moon  Procedure(s) Performed: LEFT CARPAL TUNNEL RELEASE (Left )  Patient Location: PACU  Anesthesia Type:Bier block  Level of Consciousness: awake, alert  and oriented  Airway & Oxygen Therapy: Patient Spontanous Breathing  Post-op Assessment: Report given to RN  Post vital signs: Reviewed and stable  Last Vitals:  Vitals Value Taken Time  BP 114/64 10/23/2017 12:57 PM  Temp    Pulse 70 10/23/2017 12:59 PM  Resp 14 10/23/2017 12:59 PM  SpO2 93 % 10/23/2017 12:59 PM  Vitals shown include unvalidated device data.  Last Pain:  Vitals:   10/23/17 1047  TempSrc: Oral  PainSc: 0-No pain      Patients Stated Pain Goal: 8 (42/10/31 2811)  Complications: No apparent anesthesia complications

## 2017-10-24 ENCOUNTER — Encounter (HOSPITAL_COMMUNITY): Payer: Self-pay | Admitting: Orthopedic Surgery

## 2017-11-07 ENCOUNTER — Encounter: Payer: Self-pay | Admitting: Orthopedic Surgery

## 2017-11-07 ENCOUNTER — Ambulatory Visit (INDEPENDENT_AMBULATORY_CARE_PROVIDER_SITE_OTHER): Payer: Medicare Other | Admitting: Orthopedic Surgery

## 2017-11-07 VITALS — BP 128/68 | HR 62 | Ht 61.0 in | Wt 204.0 lb

## 2017-11-07 DIAGNOSIS — Z9889 Other specified postprocedural states: Secondary | ICD-10-CM

## 2017-11-07 DIAGNOSIS — G5601 Carpal tunnel syndrome, right upper limb: Secondary | ICD-10-CM | POA: Diagnosis not present

## 2017-11-07 NOTE — Progress Notes (Signed)
Chief Complaint  Patient presents with  . Hand Pain    Carpal tunnel left hand  DOS 10/23/17   Postop day #15 status post left carpal tunnel release  Patient tells [place] that her numbness and tingling have resolved  We removed her sutures  We had to apply some skin steel and Steri-Strips  She will continue active range of motion exercises follow-up in 4 weeks  Encounter Diagnosis  Name Primary?  . S/P carpal tunnel release left 10/23/17 Yes    Addendum  Chief complaint pain and paresthesias right upper extremity  History of present illness she is 82 years old she has pain in the right upper extremity in the right hand described as burning numbness and tingling moderately severe has not had it for several months not improving.  Period of rest as nonoperative treatment has not improved her symptoms.  She is diabetic she had positive nerve studies.  She would like surgery done on the right upper extremity now  Past Medical History:  Diagnosis Date  . Arthritis   . Asthma   . COPD (chronic obstructive pulmonary disease) ([place])   . Diabetes mellitus without complication ([place])   . GERD (gastroesophageal reflux disease)   . Headache   . Hyperlipidemia   . Hypertension   . Macular degeneration   . Neuropathy   . Obesity   . Osteopenia   . Stress incontinence     Past Surgical History:  Procedure Laterality Date  . BREAST LUMPECTOMY Bilateral    no cancer  . CARPAL TUNNEL RELEASE Left 10/23/2017   Procedure: LEFT CARPAL TUNNEL RELEASE;  Surgeon: Carole Civil, MD;  Location: [facility];  Service: Orthopedics;  Laterality: Left;  . CATARACT EXTRACTION W/PHACO Left 11/01/2016   Procedure: CATARACT EXTRACTION PHACO AND INTRAOCULAR LENS PLACEMENT (IOC);  Surgeon: Baruch Goldmann, MD;  Location: [facility];  Service: Ophthalmology;  Laterality: Left;  CDE: 3.83  . CATARACT EXTRACTION W/PHACO Right 11/29/2016   Procedure: CATARACT EXTRACTION PHACO AND INTRAOCULAR LENS PLACEMENT RIGHT EYE;   Surgeon: Baruch Goldmann, MD;  Location: [facility];  Service: Ophthalmology;  Laterality: Right;  CDE: 8.71  . colon tumor removed  2010   Dr. 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 COLONOSCOPY  05/2013   Dr. Anthony Sar: normal. (h/o colon polyps)  . COLONOSCOPY  2011   normal  . COLONOSCOPY  10/2003   Dr. Lindalou Hose: large polypoid mass hepatic fluexure  . ESOPHAGOGASTRODUODENOSCOPY N/A 04/29/2016   Procedure: ESOPHAGOGASTRODUODENOSCOPY (EGD);  Surgeon: Danie Binder, MD;  Location: [facility];  Service: Endoscopy;  Laterality: N/A;  9:15am  . SAVORY DILATION N/A 04/29/2016   Procedure: SAVORY DILATION;  Surgeon: Danie Binder, MD;  Location: [facility];  Service: Endoscopy;  Laterality: N/A;    Family History  Problem Relation Age of Onset  . Melanoma Brother   . Colon cancer Neg Hx     Social History   Tobacco Use  . Smoking status: Former Smoker    Packs/day: 2.00    Years: 20.00    Pack years: 40.00    Types: Cigarettes    Last attempt to quit: 02/25/2002    Years since quitting: 15.7  . Smokeless tobacco: Never Used  Substance Use Topics  . Alcohol use: No  . Drug use: No    No orders of the defined types were placed in this encounter.  BP 128/68   Pulse 62  Ht 5\' 1"  (1.549 m)   Wt 204 lb (92.5 kg)   BMI 38.55 kg/m   This is a pleasant well-developed well-nourished elderly female who looks younger than her stated age of 82.  She is oriented x3 has good attention mood is pleasant affect is normal she walks independently with no limp  Her left upper extremity shows a healed wound full range of motion no swelling  The right upper extremity is tender over the carpal tunnel she exhibits full range of motion in the hand and wrist, she has no instability in the wrist.  The skin is warm dry and intact she has some sun spots.  Pulse and temperature are normal epitrochlear lymph nodes are negative she has  decreased sensation in the median nerve distribution she has no pathologic reflexes in her upper extremity coordination is normal  Right carpal tunnel syndrome  Surgical right carpal tunnel release

## 2017-11-11 DIAGNOSIS — E119 Type 2 diabetes mellitus without complications: Secondary | ICD-10-CM | POA: Diagnosis not present

## 2017-11-11 DIAGNOSIS — E782 Mixed hyperlipidemia: Secondary | ICD-10-CM | POA: Diagnosis not present

## 2017-11-11 DIAGNOSIS — I10 Essential (primary) hypertension: Secondary | ICD-10-CM | POA: Diagnosis not present

## 2017-11-13 NOTE — Patient Instructions (Signed)
Michaela Moon  11/13/2017     @PREFPERIOPPHARMACY @   Your procedure is scheduled on  11/20/2017.  Report to [facility] at  715  A.M.  Call this number if you have problems the morning of surgery:  [phone]   Remember:  Do not eat or drink after midnight.  You may drink clear liquids until  12 midnight 11/19/2017 .  Clear liquids allowed are:                    Water, Juice (non-citric and without pulp), Carbonated beverages, Clear Tea, Black Coffee only, Plain Jell-O only, Gatorade and Plain Popsicles only    Take these medicines the morning of surgery with A SIP OF WATER  Amlodipine, gabapentin, losartan, mobic, metoprolol, oxybutynin, zantac. Use your inhaler before you come.    Do not wear jewelry, make-up or nail polish.  Do not wear lotions, powders, or perfumes, or deodorant.  Do not shave 48 hours prior to surgery.  Men may shave face and neck.  Do not bring valuables to the hospital.  [facility] is not responsible for any belongings or valuables.  Contacts, dentures or bridgework may not be worn into surgery.  Leave your suitcase in the car.  After surgery it may be brought to your room.  For patients admitted to the hospital, discharge time will be determined by your treatment team.  Patients discharged the day of surgery will not be allowed to drive home.   Name and phone number of your driver:   family Special instructions:   None  Please read over the following fact sheets that you were given. Anesthesia Post-op Instructions and Care and Recovery After Surgery       Carpal Tunnel Release Carpal tunnel release is a surgical procedure to relieve numbness and pain in your hand that are caused by carpal tunnel syndrome. Your carpal tunnel is a narrow, hollow space in your wrist. It passes between your wrist bones and a band of connective tissue (transverse carpal ligament). The nerve that supplies most of your hand (median nerve) passes through  this space, and so do the connections between your fingers and the muscles of your arm (tendons). Carpal tunnel syndrome makes this space swell and become narrow, and this causes pain and numbness. In carpal tunnel release surgery, a surgeon cuts through the transverse carpal ligament to make more room in the carpal tunnel space. You may have this surgery if other types of treatment have not worked. Tell a health care provider about:  Any allergies you have.  All medicines you are taking, including vitamins, herbs, eye drops, creams, and over-the-counter medicines.  Any problems you or family members have had with anesthetic medicines.  Any blood disorders you have.  Any surgeries you have had.  Any medical conditions you have. What are the risks? Generally, this is a safe procedure. However, problems may occur, including:  Bleeding.  Infection.  Injury to the median nerve.  Need for additional surgery.  What happens before the procedure?  Ask your health care provider about: ? Changing or stopping your regular medicines. This is especially important if you are taking diabetes medicines or blood thinners. ? Taking medicines such as aspirin and ibuprofen. These medicines can thin your blood. Do not take these medicines before your procedure if your health care provider instructs you not to.  Do not eat or drink anything after midnight on  the night before the procedure or as directed by your health care provider.  Plan to have someone take you home after the procedure. What happens during the procedure?  An IV tube may be inserted into a vein.  You will be given one of the following: ? A medicine that numbs the wrist area (local anesthetic). You may also be given a medicine to make you relax (sedative). ? A medicine that makes you go to sleep (general anesthetic).  Your arm, hand, and wrist will be cleaned with a germ-killing solution (antiseptic).  Your surgeon will make a  surgical cut (incision) over the palm side of your wrist. The surgeon will pull aside the skin of your wrist to expose the carpal tunnel space.  The surgeon will cut the transverse carpal ligament.  The edges of the incision will be closed with stitches (sutures) or staples.  A bandage (dressing) will be placed over your wrist and wrapped around your hand and wrist. What happens after the procedure?  You may spend some time in a recovery area.  Your blood pressure, heart rate, breathing rate, and blood oxygen level will be monitored often until the medicines you were given have worn off.  You will likely have some pain. You will be given pain medicine.  You may need to wear a splint or a wrist brace over your dressing. This information is not intended to replace advice given to you by your health care provider. Make sure you discuss any questions you have with your health care provider. Document Released: 05/04/2003 Document Revised: 07/20/2015 Document Reviewed: 09/29/2013 Elsevier Interactive Patient Education  2018 [place] After Refer to this sheet in the next few weeks. These instructions provide you with information about caring for yourself after your procedure. Your health care provider may also give you more specific instructions. Your treatment has been planned according to current medical practices, but problems sometimes occur. Call your health care provider if you have any problems or questions after your procedure. What can I expect after the procedure? After your procedure, it is typical to have the following:  Pain.  Numbness.  Tingling.  Swelling.  Stiffness.  Bruising.  Follow these instructions at home:  Take medicines only as directed by your health care provider.  There are many different ways to close and cover an incision, including stitches (sutures), skin glue, and adhesive strips. Follow your health care provider's  instructions about: ? Incision care. ? Bandage (dressing) changes and removal. ? Incision closure removal.  Wear a splint or a brace as directed by your surgeon. You may need to do this for 2-3 weeks.  Keep your hand raised (elevated) above the level of your heart while you are resting. Move your fingers often.  Avoid activities that cause hand pain.  Ask your surgeon when you can start to do all of your usual activities again, such as: ? Driving. ? Returning to work. ? Bathing and swimming.  Keep all follow-up visits as directed by your health care provider. This is important. You may need physical therapy for several months to speed healing and regain movement. Contact a health care provider if:  You have drainage, redness, swelling, or pain at your incision site.  You have a fever.  You have chills.  Your pain medicine is not working.  Your symptoms do not go away after 2 months.  Your symptoms go away and then return. Get help right away if:  You  have pain or numbness that is getting worse.  Your fingers change color.  You are not able to move your fingers. This information is not intended to replace advice given to you by your health care provider. Make sure you discuss any questions you have with your health care provider. Document Released: 08/31/2004 Document Revised: 07/20/2015 Document Reviewed: 09/29/2013 Elsevier Interactive Patient Education  2018 [place] Anesthesia is a term that refers to techniques, procedures, and medicines that help a person stay safe and comfortable during a medical procedure. Monitored anesthesia care, or sedation, is one type of anesthesia. Your anesthesia specialist may recommend sedation if you will be having a procedure that does not require you to be unconscious, such as:  Cataract surgery.  A dental procedure.  A biopsy.  A colonoscopy.  During the procedure, you may receive a medicine to  help you relax (sedative). There are three levels of sedation:  Mild sedation. At this level, you may feel awake and relaxed. You will be able to follow directions.  Moderate sedation. At this level, you will be sleepy. You may not remember the procedure.  Deep sedation. At this level, you will be asleep. You will not remember the procedure.  The more medicine you are given, the deeper your level of sedation will be. Depending on how you respond to the procedure, the anesthesia specialist may change your level of sedation or the type of anesthesia to fit your needs. An anesthesia specialist will monitor you closely during the procedure. Let your health care provider know about:  Any allergies you have.  All medicines you are taking, including vitamins, herbs, eye drops, creams, and over-the-counter medicines.  Any use of steroids (by mouth or as a cream).  Any problems you or family members have had with sedatives and anesthetic medicines.  Any blood disorders you have.  Any surgeries you have had.  Any medical conditions you have, such as sleep apnea.  Whether you are pregnant or may be pregnant.  Any use of cigarettes, alcohol, or street drugs. What are the risks? Generally, this is a safe procedure. However, problems may occur, including:  Getting too much medicine (oversedation).  Nausea.  Allergic reaction to medicines.  Trouble breathing. If this happens, a breathing tube may be used to help with breathing. It will be removed when you are awake and breathing on your own.  Heart trouble.  Lung trouble.  Before the procedure Staying hydrated Follow instructions from your health care provider about hydration, which may include:  Up to 2 hours before the procedure - you may continue to drink clear liquids, such as water, clear fruit juice, black coffee, and plain tea.  Eating and drinking restrictions Follow instructions from your health care provider about eating  and drinking, which may include:  8 hours before the procedure - stop eating heavy meals or foods such as meat, fried foods, or fatty foods.  6 hours before the procedure - stop eating light meals or foods, such as toast or cereal.  6 hours before the procedure - stop drinking milk or drinks that contain milk.  2 hours before the procedure - stop drinking clear liquids.  Medicines Ask your health care provider about:  Changing or stopping your regular medicines. This is especially important if you are taking diabetes medicines or blood thinners.  Taking medicines such as aspirin and ibuprofen. These medicines can thin your blood. Do not take these medicines before your procedure if  your health care provider instructs you not to.  Tests and exams  You will have a physical exam.  You may have blood tests done to show: ? How well your kidneys and liver are working. ? How well your blood can clot.  General instructions  Plan to have someone take you home from the hospital or clinic.  If you will be going home right after the procedure, plan to have someone with you for 24 hours.  What happens during the procedure?  Your blood pressure, heart rate, breathing, level of pain and overall condition will be monitored.  An IV tube will be inserted into one of your veins.  Your anesthesia specialist will give you medicines as needed to keep you comfortable during the procedure. This may mean changing the level of sedation.  The procedure will be performed. After the procedure  Your blood pressure, heart rate, breathing rate, and blood oxygen level will be monitored until the medicines you were given have worn off.  Do not drive for 24 hours if you received a sedative.  You may: ? Feel sleepy, clumsy, or nauseous. ? Feel forgetful about what happened after the procedure. ? Have a sore throat if you had a breathing tube during the procedure. ? Vomit. This information is not  intended to replace advice given to you by your health care provider. Make sure you discuss any questions you have with your health care provider. Document Released: 11/07/2004 Document Revised: 07/21/2015 Document Reviewed: 06/04/2015 Elsevier Interactive Patient Education  2018 [place], Care After These instructions provide you with information about caring for yourself after your procedure. Your health care provider may also give you more specific instructions. Your treatment has been planned according to current medical practices, but problems sometimes occur. Call your health care provider if you have any problems or questions after your procedure. What can I expect after the procedure? After your procedure, it is common to:  Feel sleepy for several hours.  Feel clumsy and have poor balance for several hours.  Feel forgetful about what happened after the procedure.  Have poor judgment for several hours.  Feel nauseous or vomit.  Have a sore throat if you had a breathing tube during the procedure.  Follow these instructions at home: For at least 24 hours after the procedure:   Do not: ? Participate in activities in which you could fall or become injured. ? Drive. ? Use heavy machinery. ? Drink alcohol. ? Take sleeping pills or medicines that cause drowsiness. ? Make important decisions or sign legal documents. ? Take care of children on your own.  Rest. Eating and drinking  Follow the diet that is recommended by your health care provider.  If you vomit, drink water, juice, or soup when you can drink without vomiting.  Make sure you have little or no nausea before eating solid foods. General instructions  Have a responsible adult stay with you until you are awake and alert.  Take over-the-counter and prescription medicines only as told by your health care provider.  If you smoke, do not smoke without supervision.  Keep all follow-up  visits as told by your health care provider. This is important. Contact a health care provider if:  You keep feeling nauseous or you keep vomiting.  You feel light-headed.  You develop a rash.  You have a fever. Get help right away if:  You have trouble breathing. This information is not intended to replace advice given  to you by your health care provider. Make sure you discuss any questions you have with your health care provider. Document Released: 06/04/2015 Document Revised: 10/04/2015 Document Reviewed: 06/04/2015 Elsevier Interactive Patient Education  [employer].

## 2017-11-17 ENCOUNTER — Encounter (HOSPITAL_COMMUNITY): Payer: Self-pay

## 2017-11-17 ENCOUNTER — Encounter (HOSPITAL_COMMUNITY)
Admission: RE | Admit: 2017-11-17 | Discharge: 2017-11-17 | Disposition: A | Discharge status: Home / Self Care | Payer: Medicare Other | Source: Ambulatory Visit | Attending: Orthopedic Surgery | Admitting: Orthopedic Surgery

## 2017-11-19 NOTE — H&P (Signed)
Chief Complaint  Patient presents with  . Hand Pain      Carpal tunnel left hand  DOS 10/23/17    Postop day #15 status post left carpal tunnel release   Patient tells [place] that her numbness and tingling have resolved   We removed her sutures   We had to apply some skin steel and Steri-Strips   She will continue active range of motion exercises follow-up in 4 weeks       Encounter Diagnosis  Name Primary?  . S/P carpal tunnel release left 10/23/17 Yes      Addendum   Chief complaint pain and paresthesias right upper extremity   History of present illness she is 82 years old she has pain in the right upper extremity in the right hand described as burning numbness and tingling moderately severe has not had it for several months not improving.  Period of rest as nonoperative treatment has not improved her symptoms.  She is diabetic she had positive nerve studies.  She would like surgery done on the right upper extremity now       Past Medical History:  Diagnosis Date  . Arthritis    . Asthma    . COPD (chronic obstructive pulmonary disease) ([place])    . Diabetes mellitus without complication ([place])    . GERD (gastroesophageal reflux disease)    . Headache    . Hyperlipidemia    . Hypertension    . Macular degeneration    . Neuropathy    . Obesity    . Osteopenia    . Stress incontinence             Past Surgical History:  Procedure Laterality Date  . BREAST LUMPECTOMY Bilateral      no cancer  . CARPAL TUNNEL RELEASE Left 10/23/2017    Procedure: LEFT CARPAL TUNNEL RELEASE;  Surgeon: Carole Civil, MD;  Location: [facility];  Service: Orthopedics;  Laterality: Left;  . CATARACT EXTRACTION W/PHACO Left 11/01/2016    Procedure: CATARACT EXTRACTION PHACO AND INTRAOCULAR LENS PLACEMENT (IOC);  Surgeon: Baruch Goldmann, MD;  Location: [facility];  Service: Ophthalmology;  Laterality: Left;  CDE: 3.83  . CATARACT EXTRACTION W/PHACO Right 11/29/2016    Procedure: CATARACT EXTRACTION  PHACO AND INTRAOCULAR LENS PLACEMENT RIGHT EYE;  Surgeon: Baruch Goldmann, MD;  Location: [facility];  Service: Ophthalmology;  Laterality: Right;  CDE: 8.71  . colon tumor removed   2010    Dr. 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 COLONOSCOPY   05/2013    Dr. Anthony Sar: normal. (h/o colon polyps)  . COLONOSCOPY   2011    normal  . COLONOSCOPY   10/2003    Dr. Lindalou Hose: large polypoid mass hepatic fluexure  . ESOPHAGOGASTRODUODENOSCOPY N/A 04/29/2016    Procedure: ESOPHAGOGASTRODUODENOSCOPY (EGD);  Surgeon: Danie Binder, MD;  Location: [facility];  Service: Endoscopy;  Laterality: N/A;  9:15am  . SAVORY DILATION N/A 04/29/2016    Procedure: SAVORY DILATION;  Surgeon: Danie Binder, MD;  Location: [facility];  Service: Endoscopy;  Laterality: N/A;           Family History  Problem Relation Age of Onset  . Melanoma Brother    . Colon cancer Neg Hx        Social History         Tobacco Use  . Smoking status: Former Smoker  Packs/day: 2.00      Years: 20.00      Pack years: 40.00      Types: Cigarettes      Last attempt to quit: 02/25/2002      Years since quitting: 15.7  . Smokeless tobacco: Never Used  Substance Use Topics  . Alcohol use: No  . Drug use: No      No orders of the defined types were placed in this encounter.   BP 128/68   Pulse 62   Ht 5\' 1"  (1.549 m)   Wt 204 lb (92.5 kg)   BMI 38.55 kg/m    This is a pleasant well-developed well-nourished elderly female who looks younger than her stated age of 68.  She is oriented x3 has good attention mood is pleasant affect is normal she walks independently with no limp   Her left upper extremity shows a healed wound full range of motion no swelling   The right upper extremity is tender over the carpal tunnel she exhibits full range of motion in the hand and wrist, she has no instability in the wrist.  The skin is warm dry and intact she has  some sun spots.  Pulse and temperature are normal epitrochlear lymph nodes are negative she has decreased sensation in the median nerve distribution she has no pathologic reflexes in her upper extremity coordination is normal   Right carpal tunnel syndrome   Surgical right carpal tunnel release

## 2017-11-20 ENCOUNTER — Ambulatory Visit (HOSPITAL_COMMUNITY)
Admission: RE | Admit: 2017-11-20 | Discharge: 2017-11-20 | Disposition: A | Discharge status: Home / Self Care | Payer: Medicare Other | Source: Ambulatory Visit | Attending: Orthopedic Surgery | Admitting: Orthopedic Surgery

## 2017-11-20 ENCOUNTER — Ambulatory Visit (HOSPITAL_COMMUNITY): Payer: Medicare Other | Admitting: Anesthesiology

## 2017-11-20 ENCOUNTER — Encounter (HOSPITAL_COMMUNITY)
Admission: RE | Disposition: A | Discharge status: Home / Self Care | Payer: Self-pay | Source: Ambulatory Visit | Attending: Orthopedic Surgery

## 2017-11-20 ENCOUNTER — Encounter (HOSPITAL_COMMUNITY): Payer: Self-pay

## 2017-11-20 DIAGNOSIS — G5601 Carpal tunnel syndrome, right upper limb: Secondary | ICD-10-CM | POA: Diagnosis not present

## 2017-11-20 DIAGNOSIS — Z87891 Personal history of nicotine dependence: Secondary | ICD-10-CM | POA: Diagnosis not present

## 2017-11-20 DIAGNOSIS — I10 Essential (primary) hypertension: Secondary | ICD-10-CM | POA: Insufficient documentation

## 2017-11-20 DIAGNOSIS — Z6838 Body mass index (BMI) 38.0-38.9, adult: Secondary | ICD-10-CM | POA: Insufficient documentation

## 2017-11-20 DIAGNOSIS — J45909 Unspecified asthma, uncomplicated: Secondary | ICD-10-CM | POA: Diagnosis not present

## 2017-11-20 DIAGNOSIS — K219 Gastro-esophageal reflux disease without esophagitis: Secondary | ICD-10-CM | POA: Diagnosis not present

## 2017-11-20 DIAGNOSIS — E785 Hyperlipidemia, unspecified: Secondary | ICD-10-CM | POA: Diagnosis not present

## 2017-11-20 DIAGNOSIS — E669 Obesity, unspecified: Secondary | ICD-10-CM | POA: Insufficient documentation

## 2017-11-20 DIAGNOSIS — E1140 Type 2 diabetes mellitus with diabetic neuropathy, unspecified: Secondary | ICD-10-CM | POA: Insufficient documentation

## 2017-11-20 DIAGNOSIS — G4733 Obstructive sleep apnea (adult) (pediatric): Secondary | ICD-10-CM | POA: Insufficient documentation

## 2017-11-20 DIAGNOSIS — J449 Chronic obstructive pulmonary disease, unspecified: Secondary | ICD-10-CM | POA: Diagnosis not present

## 2017-11-20 HISTORY — PX: CARPAL TUNNEL RELEASE: SHX101

## 2017-11-20 LAB — GLUCOSE, CAPILLARY
GLUCOSE-CAPILLARY: 178 mg/dL — AB (ref 70–99)
GLUCOSE-CAPILLARY: 174 mg/dL — AB (ref 70–99)

## 2017-11-20 SURGERY — CARPAL TUNNEL RELEASE
Anesthesia: Regional | Site: Wrist | Laterality: Right

## 2017-11-20 MED ORDER — HYDROCODONE-ACETAMINOPHEN 7.5-325 MG PO TABS: 1.0000 | ORAL_TABLET | Freq: Once | ORAL | Status: DC | PRN

## 2017-11-20 MED ORDER — FENTANYL CITRATE (PF) 100 MCG/2ML IJ SOLN
INTRAMUSCULAR | Status: DC | PRN
  Administered 2017-11-20: 25 ug via INTRAVENOUS

## 2017-11-20 MED ORDER — 0.9 % SODIUM CHLORIDE (POUR BTL) OPTIME
TOPICAL | Status: DC | PRN
  Administered 2017-11-20: 1000 mL

## 2017-11-20 MED ORDER — CEFAZOLIN SODIUM-DEXTROSE 2-4 GM/100ML-% IV SOLN
2.0000 g | INTRAVENOUS | Status: AC
  Administered 2017-11-20: 2 g via INTRAVENOUS

## 2017-11-20 MED ORDER — CHLORHEXIDINE GLUCONATE 4 % EX LIQD: 60.0000 mL | Freq: Once | CUTANEOUS | Status: DC

## 2017-11-20 MED ORDER — LACTATED RINGERS IV SOLN: INTRAVENOUS | Status: DC

## 2017-11-20 MED ORDER — LACTATED RINGERS IV SOLN
INTRAVENOUS | Status: DC
  Administered 2017-11-20: 08:00:00 via INTRAVENOUS

## 2017-11-20 MED ORDER — CEFAZOLIN SODIUM-DEXTROSE 2-4 GM/100ML-% IV SOLN
INTRAVENOUS | Status: AC
  Filled 2017-11-20: qty 100

## 2017-11-20 MED ORDER — BUPIVACAINE HCL (PF) 0.5 % IJ SOLN
INTRAMUSCULAR | Status: DC | PRN
  Administered 2017-11-20: 10 mL

## 2017-11-20 MED ORDER — PROPOFOL 10 MG/ML IV BOLUS
INTRAVENOUS | Status: AC
  Filled 2017-11-20: qty 20

## 2017-11-20 MED ORDER — FENTANYL CITRATE (PF) 100 MCG/2ML IJ SOLN
INTRAMUSCULAR | Status: AC
  Filled 2017-11-20: qty 2

## 2017-11-20 MED ORDER — MIDAZOLAM HCL 5 MG/5ML IJ SOLN
INTRAMUSCULAR | Status: DC | PRN
  Administered 2017-11-20 (×2): 1 mg via INTRAVENOUS

## 2017-11-20 MED ORDER — HYDROMORPHONE HCL 1 MG/ML IJ SOLN: 0.2500 mg | INTRAMUSCULAR | Status: DC | PRN

## 2017-11-20 MED ORDER — MEPERIDINE HCL 50 MG/ML IJ SOLN: 6.2500 mg | INTRAMUSCULAR | Status: DC | PRN

## 2017-11-20 MED ORDER — BUPIVACAINE HCL (PF) 0.5 % IJ SOLN
INTRAMUSCULAR | Status: AC
  Filled 2017-11-20: qty 30

## 2017-11-20 MED ORDER — LACTATED RINGERS IV SOLN
INTRAVENOUS | Status: DC | PRN
  Administered 2017-11-20: 08:00:00 via INTRAVENOUS

## 2017-11-20 MED ORDER — MIDAZOLAM HCL 2 MG/2ML IJ SOLN
INTRAMUSCULAR | Status: AC
  Filled 2017-11-20: qty 2

## 2017-11-20 MED ORDER — LIDOCAINE HCL (PF) 0.5 % IJ SOLN
INTRAMUSCULAR | Status: DC | PRN
  Administered 2017-11-20: 250 mg via INTRAVENOUS

## 2017-11-20 MED ORDER — PROPOFOL 500 MG/50ML IV EMUL
INTRAVENOUS | Status: DC | PRN
  Administered 2017-11-20: 25 ug/kg/min via INTRAVENOUS

## 2017-11-20 MED ORDER — PROMETHAZINE HCL 25 MG/ML IJ SOLN: 6.2500 mg | INTRAMUSCULAR | Status: DC | PRN

## 2017-11-20 MED ORDER — ACETAMINOPHEN-CODEINE #3 300-30 MG PO TABS: 1.0000 | ORAL_TABLET | Freq: Four times a day (QID) | ORAL | 0 refills | Status: DC | PRN

## 2017-11-20 MED ORDER — SODIUM CHLORIDE 0.9% FLUSH
INTRAVENOUS | Status: AC
  Filled 2017-11-20: qty 10

## 2017-11-20 SURGICAL SUPPLY — 37 items
BANDAGE ELASTIC 3 LF NS (GAUZE/BANDAGES/DRESSINGS) ×3 IMPLANT
BANDAGE ESMARK 4X12 BL STRL LF (DISPOSABLE) ×1 IMPLANT
BLADE SURG 15 STRL LF DISP TIS (BLADE) ×1 IMPLANT
BLADE SURG 15 STRL SS (BLADE) ×2
BNDG COHESIVE 4X5 TAN STRL (GAUZE/BANDAGES/DRESSINGS) ×3 IMPLANT
BNDG ESMARK 4X12 BLUE STRL LF (DISPOSABLE) ×3
BNDG GAUZE ELAST 4 BULKY (GAUZE/BANDAGES/DRESSINGS) ×3 IMPLANT
CHLORAPREP W/TINT 26ML (MISCELLANEOUS) ×3 IMPLANT
CLOTH BEACON ORANGE TIMEOUT ST (SAFETY) ×3 IMPLANT
COVER LIGHT HANDLE STERIS (MISCELLANEOUS) ×6 IMPLANT
CUFF TOURNIQUET SINGLE 18IN (TOURNIQUET CUFF) ×3 IMPLANT
DECANTER SPIKE VIAL GLASS SM (MISCELLANEOUS) ×3 IMPLANT
DRAPE PROXIMA HALF (DRAPES) ×3 IMPLANT
DRSG XEROFORM 1X8 (GAUZE/BANDAGES/DRESSINGS) ×3 IMPLANT
ELECT NEEDLE TIP 2.8 STRL (NEEDLE) ×3 IMPLANT
ELECT REM PT RETURN 9FT ADLT (ELECTROSURGICAL) ×3
ELECTRODE REM PT RTRN 9FT ADLT (ELECTROSURGICAL) ×1 IMPLANT
GAUZE SPONGE 4X4 12PLY STRL (GAUZE/BANDAGES/DRESSINGS) ×3 IMPLANT
GLOVE BIO SURGEON STRL SZ7 (GLOVE) ×3 IMPLANT
GLOVE BIOGEL PI IND STRL 7.0 (GLOVE) ×2 IMPLANT
GLOVE BIOGEL PI INDICATOR 7.0 (GLOVE) ×4
GLOVE SKINSENSE NS SZ8.0 LF (GLOVE) ×2
GLOVE SKINSENSE STRL SZ8.0 LF (GLOVE) ×1 IMPLANT
GLOVE SS N UNI LF 8.5 STRL (GLOVE) ×3 IMPLANT
GOWN STRL REUS W/ TWL LRG LVL3 (GOWN DISPOSABLE) ×1 IMPLANT
GOWN STRL REUS W/TWL LRG LVL3 (GOWN DISPOSABLE) ×2
GOWN STRL REUS W/TWL XL LVL3 (GOWN DISPOSABLE) ×3 IMPLANT
KIT TURNOVER KIT A (KITS) ×3 IMPLANT
MANIFOLD NEPTUNE II (INSTRUMENTS) ×3 IMPLANT
NEEDLE HYPO 21X1.5 SAFETY (NEEDLE) ×3 IMPLANT
NS IRRIG 1000ML POUR BTL (IV SOLUTION) ×3 IMPLANT
PACK BASIC LIMB (CUSTOM PROCEDURE TRAY) ×3 IMPLANT
PAD ARMBOARD 7.5X6 YLW CONV (MISCELLANEOUS) ×3 IMPLANT
POSITIONER HAND ALUMI XLG (MISCELLANEOUS) ×3 IMPLANT
SET BASIN LINEN APH (SET/KITS/TRAYS/PACK) ×3 IMPLANT
SUT ETHILON 3 0 FSL (SUTURE) ×3 IMPLANT
SYR CONTROL 10ML LL (SYRINGE) ×3 IMPLANT

## 2017-11-20 NOTE — Discharge Instructions (Signed)
Elevate the hand for 72 hours.  Apply ice packs as needed to control swelling.  Moves your fingers every hour opening and closing the hand to make a closed fist 10 times per hour while awake Keep dressing clean and dry You can remove the dressing on Sunday apply triple antibiotic ointment or Neosporin and a dry dressing or large Band-Aid Re-wrap the hand with the Ace bandage loosely. Change the dressing daily until you see the doctor  PATIENT INSTRUCTIONS POST-ANESTHESIA  IMMEDIATELY FOLLOWING SURGERY:  Do not drive or operate machinery for the first twenty four hours after surgery.  Do not make any important decisions for twenty four hours after surgery or while taking narcotic pain medications or sedatives.  If you develop intractable nausea and vomiting or a severe headache please notify your doctor immediately.  FOLLOW-UP:  Please make an appointment with your surgeon as instructed. You do not need to follow up with anesthesia unless specifically instructed to do so.  WOUND CARE INSTRUCTIONS (if applicable):  Keep a dry clean dressing on the anesthesia/puncture wound site if there is drainage.  Once the wound has quit draining you may leave it open to air.  Generally you should leave the bandage intact for twenty four hours unless there is drainage.  If the epidural site drains for more than 36-48 hours please call the anesthesia department.  QUESTIONS?:  Please feel free to call your physician or the hospital operator if you have any questions, and they will be happy to assist you.

## 2017-11-20 NOTE — Interval H&P Note (Signed)
History and Physical Interval Note:  11/20/2017 7:15 AM  Michaela Moon  has presented today for surgery, with the diagnosis of right carpal tunnel syndrome  The various methods of treatment have been discussed with the patient and family. After consideration of risks, benefits and other options for treatment, the patient has consented to  Procedure(s): CARPAL TUNNEL RELEASE (Right) as a surgical intervention .  The patient's history has been reviewed, patient examined, no change in status, stable for surgery.  I have reviewed the patient's chart and labs.  Questions were answered to the patient's satisfaction.     Arther Abbott

## 2017-11-20 NOTE — Anesthesia Preprocedure Evaluation (Signed)
Anesthesia Evaluation  Patient identified by MRN, date of birth, ID band Patient awake    Reviewed: Allergy & Precautions, H&P , NPO status , Patient's Chart, lab work & pertinent test results, reviewed documented beta blocker date and time   Airway Mallampati: II  TM Distance: >3 FB Neck ROM: full    Dental no notable dental hx. (+) Upper Dentures, Lower Dentures   Pulmonary neg pulmonary ROS, asthma , resolved, COPD,  COPD inhaler, former smoker,    Pulmonary exam normal breath sounds clear to auscultation       Cardiovascular Exercise Tolerance: Good hypertension, On Medications negative cardio ROS   Rhythm:regular Rate:Normal     Neuro/Psych  Headaches,  Neuromuscular disease negative neurological ROS  negative psych ROS   GI/Hepatic negative GI ROS, Neg liver ROS, PUD, GERD  ,  Endo/Other  negative endocrine ROSdiabetes, Type 2  Renal/GU negative Renal ROS  negative genitourinary   Musculoskeletal   Abdominal   Peds  Hematology negative hematology ROS (+)   Anesthesia Other Findings Obesity with OSA COPD with exertional dyspnea Physically deconditioned  Reproductive/Obstetrics negative OB ROS                             Anesthesia Physical Anesthesia Plan  ASA: IV  Anesthesia Plan: Regional   Post-op Pain Management:    Induction:   PONV Risk Score and Plan:   Airway Management Planned:   Additional Equipment:   Intra-op Plan:   Post-operative Plan:   Informed Consent: I have reviewed the patients History and Physical, chart, labs and discussed the procedure including the risks, benefits and alternatives for the proposed anesthesia with the patient or authorized representative who has indicated his/her understanding and acceptance.   Dental Advisory Given  Plan Discussed with: CRNA and Anesthesiologist  Anesthesia Plan Comments:         Anesthesia Quick  Evaluation

## 2017-11-20 NOTE — Transfer of Care (Signed)
Immediate Anesthesia Transfer of Care Note  Patient: Michaela Moon  Procedure(s) Performed: CARPAL TUNNEL RELEASE (Right Wrist)  Patient Location: PACU  Anesthesia Type:Bier block  Level of Consciousness: awake and patient cooperative  Airway & Oxygen Therapy: Patient Spontanous Breathing  Post-op Assessment: Report given to RN, Post -op Vital signs reviewed and stable and Patient moving all extremities  Post vital signs: Reviewed and stable  Last Vitals:  Vitals Value Taken Time  BP    Temp    Pulse    Resp    SpO2      Last Pain:  Vitals:   11/20/17 0732  TempSrc: Oral  PainSc: 0-No pain         Complications: No apparent anesthesia complications

## 2017-11-20 NOTE — Brief Op Note (Signed)
11/20/2017  8:58 AM  PATIENT:  Michaela Moon  82 y.o. female  PRE-OPERATIVE DIAGNOSIS:  right carpal tunnel syndrome  POST-OPERATIVE DIAGNOSIS:  right carpal tunnel syndrome   PROCEDURE:  Procedure(s): CARPAL TUNNEL RELEASE (Right) - 64721  SURGEON:  Surgeon(s) and Role:    Carole Civil, MD - Primary  Carpal tunnel release right wrist  Preop diagnosis carpal tunnel syndrome right wrist postop diagnosis same  Procedure open carpal tunnel release right wrist  Surgeon Aline Brochure  Anesthesia regional Bier block  Operative findings compression of the right median nerve   Indications failure of conservative treatment to relieve pain and paresthesias and numbness and tingling of the right hand.  The patient was identified in the preop area we confirm the surgical site marked as right wrist. Chart update completed. Patient taken to surgery. She had 2 g of Ancef. After establishing a Bier block her arm was prepped sterile  Timeout executed completed and confirmed site.  A straight incision was made over the carpal tunnel in line with the radial border of the ring finger. Blunt dissection was carried out to find the distal aspect of the carpal tunnel. A blunted judgment was passed beneath the carpal tunnel. Sharp incision was then used to release the transverse carpal ligament. The contents of the carpal tunnel were inspected. The median nerve was compressed with slight discoloration.  The wound was irrigated and then closed with 3-0 nylon suture. We injected 10 mL of plain Marcaine on the radial side of the incision  A sterile bandage was applied and the tourniquet was released the color of the hand and capillary refill were normal  The patient was taken to the recovery room in stable condition  PHYSICIAN ASSISTANT:   ASSISTANTS: none   ANESTHESIA:   regional  EBL:  0 mL   BLOOD ADMINISTERED:none  DRAINS: none   LOCAL MEDICATIONS USED:  MARCAINE     SPECIMEN:  No  Specimen  DISPOSITION OF SPECIMEN:  N/A  COUNTS:  YES  TOURNIQUET:  * Missing tourniquet times found for documented tourniquets in log: [id number] *  DICTATION: .Dragon Dictation  PLAN OF CARE: Discharge to home after PACU  PATIENT DISPOSITION:  PACU - hemodynamically stable.   Delay start of Pharmacological VTE agent (>24hrs) due to surgical blood loss or risk of bleeding: not applicable

## 2017-11-20 NOTE — Op Note (Signed)
11/20/2017  8:58 AM  PATIENT:  Michaela Moon  82 y.o. female  PRE-OPERATIVE DIAGNOSIS:  right carpal tunnel syndrome  POST-OPERATIVE DIAGNOSIS:  right carpal tunnel syndrome   PROCEDURE:  Procedure(s): CARPAL TUNNEL RELEASE (Right) - 64721  SURGEON:  Surgeon(s) and Role:    Carole Civil, MD - Primary  Carpal tunnel release right wrist  Preop diagnosis carpal tunnel syndrome right wrist postop diagnosis same  Procedure open carpal tunnel release right wrist  Surgeon Aline Brochure  Anesthesia regional Bier block  Operative findings compression of the right median nerve   Indications failure of conservative treatment to relieve pain and paresthesias and numbness and tingling of the right hand.  The patient was identified in the preop area we confirm the surgical site marked as right wrist. Chart update completed. Patient taken to surgery. She had 2 g of Ancef. After establishing a Bier block her arm was prepped sterile  Timeout executed completed and confirmed site.  A straight incision was made over the carpal tunnel in line with the radial border of the ring finger. Blunt dissection was carried out to find the distal aspect of the carpal tunnel. A blunted judgment was passed beneath the carpal tunnel. Sharp incision was then used to release the transverse carpal ligament. The contents of the carpal tunnel were inspected. The median nerve was compressed with slight discoloration.  The wound was irrigated and then closed with 3-0 nylon suture. We injected 10 mL of plain Marcaine on the radial side of the incision  A sterile bandage was applied and the tourniquet was released the color of the hand and capillary refill were normal  The patient was taken to the recovery room in stable condition  PHYSICIAN ASSISTANT:   ASSISTANTS: none   ANESTHESIA:   regional  EBL:  0 mL   BLOOD ADMINISTERED:none  DRAINS: none   LOCAL MEDICATIONS USED:  MARCAINE     SPECIMEN:  No  Specimen  DISPOSITION OF SPECIMEN:  N/A  COUNTS:  YES  TOURNIQUET:  * Missing tourniquet times found for documented tourniquets in log: [id number] *  DICTATION: .Dragon Dictation  PLAN OF CARE: Discharge to home after PACU  PATIENT DISPOSITION:  PACU - hemodynamically stable.   Delay start of Pharmacological VTE agent (>24hrs) due to surgical blood loss or risk of bleeding: not applicable

## 2017-11-20 NOTE — Anesthesia Procedure Notes (Signed)
Anesthesia Regional Block: Bier block (IV Regional)   Pre-Anesthetic Checklist: ,,, Correct Patient, Correct Site, Correct Laterality, Correct Procedure, Correct Position, site marked, risks and benefits discussed, Surgical consent, Pre-op evaluation  Laterality: Right     Needles:  Injection technique: Single-shot      Needle Gauge: 22     Additional Needles:   Procedures:,,,,,, Esmarch exsanguination, single tourniquet utilized,  Narrative:  Start time: 11/20/2017 8:34 AM CRNA: Charmaine Downs, CRNA

## 2017-11-20 NOTE — Anesthesia Postprocedure Evaluation (Signed)
Anesthesia Post Note  Patient: Michaela Moon  Procedure(s) Performed: CARPAL TUNNEL RELEASE (Right Wrist)  Patient location during evaluation: PACU Anesthesia Type: Regional Level of consciousness: awake and alert and patient cooperative Pain management: pain level controlled Vital Signs Assessment: post-procedure vital signs reviewed and stable Respiratory status: spontaneous breathing, nonlabored ventilation and respiratory function stable Cardiovascular status: blood pressure returned to baseline Postop Assessment: no apparent nausea or vomiting Anesthetic complications: no     Last Vitals:  Vitals:   11/20/17 0732 11/20/17 0906  BP: (!) 172/64 (!) 149/56  Pulse: 62 65  Resp:  15  Temp: 36.7 C 37.1 C  SpO2: 97% 94%    Last Pain:  Vitals:   11/20/17 0732  TempSrc: Oral  PainSc: 0-No pain                 Sayer Masini J

## 2017-11-21 ENCOUNTER — Encounter (HOSPITAL_COMMUNITY): Payer: Self-pay | Admitting: Orthopedic Surgery

## 2017-12-04 DIAGNOSIS — H04123 Dry eye syndrome of bilateral lacrimal glands: Secondary | ICD-10-CM | POA: Diagnosis not present

## 2017-12-04 DIAGNOSIS — H353131 Nonexudative age-related macular degeneration, bilateral, early dry stage: Secondary | ICD-10-CM | POA: Diagnosis not present

## 2017-12-04 DIAGNOSIS — H26493 Other secondary cataract, bilateral: Secondary | ICD-10-CM | POA: Diagnosis not present

## 2017-12-04 DIAGNOSIS — Z961 Presence of intraocular lens: Secondary | ICD-10-CM | POA: Diagnosis not present

## 2017-12-06 DIAGNOSIS — Z23 Encounter for immunization: Secondary | ICD-10-CM | POA: Diagnosis not present

## 2017-12-08 ENCOUNTER — Ambulatory Visit (INDEPENDENT_AMBULATORY_CARE_PROVIDER_SITE_OTHER): Payer: Medicare Other | Admitting: Orthopedic Surgery

## 2017-12-08 ENCOUNTER — Encounter: Payer: Self-pay | Admitting: Orthopedic Surgery

## 2017-12-08 VITALS — BP 146/62 | HR 74 | Ht 61.0 in | Wt 199.0 lb

## 2017-12-08 DIAGNOSIS — Z9889 Other specified postprocedural states: Secondary | ICD-10-CM

## 2017-12-08 DIAGNOSIS — I10 Essential (primary) hypertension: Secondary | ICD-10-CM | POA: Diagnosis not present

## 2017-12-08 DIAGNOSIS — Z4889 Encounter for other specified surgical aftercare: Secondary | ICD-10-CM

## 2017-12-08 DIAGNOSIS — E119 Type 2 diabetes mellitus without complications: Secondary | ICD-10-CM | POA: Diagnosis not present

## 2017-12-08 DIAGNOSIS — E782 Mixed hyperlipidemia: Secondary | ICD-10-CM | POA: Diagnosis not present

## 2017-12-08 NOTE — Progress Notes (Signed)
POST OP VISIT   Patient ID: Michaela Moon, female   DOB: [birth year], 82 y.o.   MRN: [id number]  Chief complaint recheck right wrist carpal tunnel release 18 days  Encounter Diagnosis  Name Primary?  . S/P carpal tunnel release left 10/23/17 right 11/20/17 Yes   Doing well complains of ulnar-sided wrist pain on the left and right left greater than right  Mild tingling at the tips of the fingers on the left side  Right side sutures were removed wound looks good patient will exercise follow-up in 4 weeks

## 2017-12-15 ENCOUNTER — Other Ambulatory Visit: Payer: Self-pay | Admitting: Orthopedic Surgery

## 2017-12-15 MED ORDER — ACETAMINOPHEN-CODEINE #3 300-30 MG PO TABS: 1.0000 | ORAL_TABLET | Freq: Four times a day (QID) | ORAL | 0 refills | Status: DC | PRN

## 2017-12-15 NOTE — Telephone Encounter (Signed)
Patient requesting refill on medication:  acetaminophen-codeine (TYLENOL #3) 300-30 MG tablet 30 tablet  -Pharmacy: [facility] in [place]

## 2018-01-05 ENCOUNTER — Encounter: Payer: Self-pay | Admitting: Orthopedic Surgery

## 2018-01-05 ENCOUNTER — Ambulatory Visit (INDEPENDENT_AMBULATORY_CARE_PROVIDER_SITE_OTHER): Payer: Medicare Other | Admitting: Orthopedic Surgery

## 2018-01-05 VITALS — BP 161/72 | HR 71 | Ht 61.0 in | Wt 199.0 lb

## 2018-01-05 DIAGNOSIS — Z9889 Other specified postprocedural states: Secondary | ICD-10-CM

## 2018-01-05 NOTE — Progress Notes (Signed)
Chief Complaint  Patient presents with  . Routine Post Op    bilateral CTR left 10/23/17 right 11/20/17    She complains of some soreness over her right carpal tunnel incision but all of her paresthesias are gone she is regained her range of motion  I released her today to normal activity Encounter Diagnosis  Name Primary?  . S/P carpal tunnel release left 10/23/17 right 11/20/17 Yes

## 2018-01-13 DIAGNOSIS — G5602 Carpal tunnel syndrome, left upper limb: Secondary | ICD-10-CM | POA: Diagnosis not present

## 2018-01-13 DIAGNOSIS — Z0000 Encounter for general adult medical examination without abnormal findings: Secondary | ICD-10-CM | POA: Diagnosis not present

## 2018-01-13 DIAGNOSIS — E782 Mixed hyperlipidemia: Secondary | ICD-10-CM | POA: Diagnosis not present

## 2018-01-13 DIAGNOSIS — E119 Type 2 diabetes mellitus without complications: Secondary | ICD-10-CM | POA: Diagnosis not present

## 2018-01-13 DIAGNOSIS — I10 Essential (primary) hypertension: Secondary | ICD-10-CM | POA: Diagnosis not present

## 2018-01-13 DIAGNOSIS — Z1389 Encounter for screening for other disorder: Secondary | ICD-10-CM | POA: Diagnosis not present

## 2018-02-27 DIAGNOSIS — E119 Type 2 diabetes mellitus without complications: Secondary | ICD-10-CM | POA: Diagnosis not present

## 2018-02-27 DIAGNOSIS — I10 Essential (primary) hypertension: Secondary | ICD-10-CM | POA: Diagnosis not present

## 2018-02-27 DIAGNOSIS — E782 Mixed hyperlipidemia: Secondary | ICD-10-CM | POA: Diagnosis not present

## 2018-04-02 DIAGNOSIS — E119 Type 2 diabetes mellitus without complications: Secondary | ICD-10-CM | POA: Diagnosis not present

## 2018-04-02 DIAGNOSIS — I10 Essential (primary) hypertension: Secondary | ICD-10-CM | POA: Diagnosis not present

## 2018-04-02 DIAGNOSIS — E782 Mixed hyperlipidemia: Secondary | ICD-10-CM | POA: Diagnosis not present

## 2018-04-20 DIAGNOSIS — I10 Essential (primary) hypertension: Secondary | ICD-10-CM | POA: Diagnosis not present

## 2018-04-20 DIAGNOSIS — E782 Mixed hyperlipidemia: Secondary | ICD-10-CM | POA: Diagnosis not present

## 2018-04-20 DIAGNOSIS — E119 Type 2 diabetes mellitus without complications: Secondary | ICD-10-CM | POA: Diagnosis not present

## 2018-04-20 DIAGNOSIS — Z0000 Encounter for general adult medical examination without abnormal findings: Secondary | ICD-10-CM | POA: Diagnosis not present

## 2018-04-20 DIAGNOSIS — Z1389 Encounter for screening for other disorder: Secondary | ICD-10-CM | POA: Diagnosis not present

## 2018-04-20 DIAGNOSIS — G5602 Carpal tunnel syndrome, left upper limb: Secondary | ICD-10-CM | POA: Diagnosis not present

## 2018-06-02 DIAGNOSIS — E119 Type 2 diabetes mellitus without complications: Secondary | ICD-10-CM | POA: Diagnosis not present

## 2018-06-02 DIAGNOSIS — I10 Essential (primary) hypertension: Secondary | ICD-10-CM | POA: Diagnosis not present

## 2018-06-02 DIAGNOSIS — E782 Mixed hyperlipidemia: Secondary | ICD-10-CM | POA: Diagnosis not present

## 2018-06-25 ENCOUNTER — Other Ambulatory Visit: Payer: Self-pay

## 2018-06-25 NOTE — Patient Outreach (Signed)
[facility] [facility]) Care Management  06/25/2018  GRAZIA TAFFE 09-Mar-1935 [id number]   Medication Adherence call to Mrs. [place] Compliant Voice message left with a call back number. Mrs. Margolis is showing past due under [place].   [place] Management Direct Dial [phone]  Fax [phone] [email or web address]

## 2018-06-26 DIAGNOSIS — E782 Mixed hyperlipidemia: Secondary | ICD-10-CM | POA: Diagnosis not present

## 2018-06-26 DIAGNOSIS — E119 Type 2 diabetes mellitus without complications: Secondary | ICD-10-CM | POA: Diagnosis not present

## 2018-06-26 DIAGNOSIS — I10 Essential (primary) hypertension: Secondary | ICD-10-CM | POA: Diagnosis not present

## 2018-07-06 ENCOUNTER — Other Ambulatory Visit: Payer: Self-pay

## 2018-07-06 NOTE — Patient Outreach (Signed)
[facility] [place] [facility]) Care Management  07/06/2018  Michaela Moon 1935/04/03 [id number]  Medication Adherence call to Michaela Moon Hipp[facility] Identifiers Verify spoke with patient son she is due on Atorvastatin 80 mg and Losartan /Hctz 100/12.5 mg patient's son said patient filled her pick box every weekly she is due on both medication but he wants to call them in to the pharmacy. Michaela Moon is showing past due under [place].   [place] Management Direct Dial [phone]  Fax [phone] [email or web address]

## 2018-07-23 DIAGNOSIS — I10 Essential (primary) hypertension: Secondary | ICD-10-CM | POA: Diagnosis not present

## 2018-07-23 DIAGNOSIS — E119 Type 2 diabetes mellitus without complications: Secondary | ICD-10-CM | POA: Diagnosis not present

## 2018-07-23 DIAGNOSIS — E782 Mixed hyperlipidemia: Secondary | ICD-10-CM | POA: Diagnosis not present

## 2018-07-23 DIAGNOSIS — Z0000 Encounter for general adult medical examination without abnormal findings: Secondary | ICD-10-CM | POA: Diagnosis not present

## 2018-07-23 DIAGNOSIS — G5602 Carpal tunnel syndrome, left upper limb: Secondary | ICD-10-CM | POA: Diagnosis not present

## 2018-07-28 DIAGNOSIS — I10 Essential (primary) hypertension: Secondary | ICD-10-CM | POA: Diagnosis not present

## 2018-07-28 DIAGNOSIS — E782 Mixed hyperlipidemia: Secondary | ICD-10-CM | POA: Diagnosis not present

## 2018-07-28 DIAGNOSIS — E119 Type 2 diabetes mellitus without complications: Secondary | ICD-10-CM | POA: Diagnosis not present

## 2018-08-05 ENCOUNTER — Other Ambulatory Visit: Payer: Self-pay

## 2018-08-05 NOTE — Patient Outreach (Signed)
[place] [facility]) Care Management  08/05/2018  Michaela Moon [birth year] [id number]   Medication Adherence call to [name] Hippa Identifiers Verify spoke with patients son he explain patient is still taking 1 tablet daily and has already order from [facility]. [name] is showing past due on Losartan/Hctz 100/12.5 mg under [place].   [place] Management Direct Dial [phone]  Fax [phone] [email or web address]

## 2018-08-19 DIAGNOSIS — Z961 Presence of intraocular lens: Secondary | ICD-10-CM | POA: Diagnosis not present

## 2018-08-19 DIAGNOSIS — H353130 Nonexudative age-related macular degeneration, bilateral, stage unspecified: Secondary | ICD-10-CM | POA: Diagnosis not present

## 2018-08-19 DIAGNOSIS — D23122 Other benign neoplasm of skin of left lower eyelid, including canthus: Secondary | ICD-10-CM | POA: Diagnosis not present

## 2018-08-19 DIAGNOSIS — E113293 Type 2 diabetes mellitus with mild nonproliferative diabetic retinopathy without macular edema, bilateral: Secondary | ICD-10-CM | POA: Diagnosis not present

## 2018-08-19 DIAGNOSIS — H04123 Dry eye syndrome of bilateral lacrimal glands: Secondary | ICD-10-CM | POA: Diagnosis not present

## 2018-09-14 DIAGNOSIS — E119 Type 2 diabetes mellitus without complications: Secondary | ICD-10-CM | POA: Diagnosis not present

## 2018-09-14 DIAGNOSIS — I10 Essential (primary) hypertension: Secondary | ICD-10-CM | POA: Diagnosis not present

## 2018-09-14 DIAGNOSIS — E782 Mixed hyperlipidemia: Secondary | ICD-10-CM | POA: Diagnosis not present

## 2018-10-05 DIAGNOSIS — M8589 Other specified disorders of bone density and structure, multiple sites: Secondary | ICD-10-CM | POA: Diagnosis not present

## 2018-10-05 DIAGNOSIS — M810 Age-related osteoporosis without current pathological fracture: Secondary | ICD-10-CM | POA: Diagnosis not present

## 2018-10-05 DIAGNOSIS — M85852 Other specified disorders of bone density and structure, left thigh: Secondary | ICD-10-CM | POA: Diagnosis not present

## 2018-10-26 DIAGNOSIS — Z1389 Encounter for screening for other disorder: Secondary | ICD-10-CM | POA: Diagnosis not present

## 2018-10-26 DIAGNOSIS — E119 Type 2 diabetes mellitus without complications: Secondary | ICD-10-CM | POA: Diagnosis not present

## 2018-10-26 DIAGNOSIS — Z0000 Encounter for general adult medical examination without abnormal findings: Secondary | ICD-10-CM | POA: Diagnosis not present

## 2018-10-26 DIAGNOSIS — E782 Mixed hyperlipidemia: Secondary | ICD-10-CM | POA: Diagnosis not present

## 2018-10-26 DIAGNOSIS — I10 Essential (primary) hypertension: Secondary | ICD-10-CM | POA: Diagnosis not present

## 2018-10-27 DIAGNOSIS — E782 Mixed hyperlipidemia: Secondary | ICD-10-CM | POA: Diagnosis not present

## 2018-10-27 DIAGNOSIS — E119 Type 2 diabetes mellitus without complications: Secondary | ICD-10-CM | POA: Diagnosis not present

## 2018-10-27 DIAGNOSIS — I10 Essential (primary) hypertension: Secondary | ICD-10-CM | POA: Diagnosis not present

## 2018-11-26 DIAGNOSIS — I10 Essential (primary) hypertension: Secondary | ICD-10-CM | POA: Diagnosis not present

## 2018-11-26 DIAGNOSIS — E119 Type 2 diabetes mellitus without complications: Secondary | ICD-10-CM | POA: Diagnosis not present

## 2018-11-26 DIAGNOSIS — E782 Mixed hyperlipidemia: Secondary | ICD-10-CM | POA: Diagnosis not present

## 2018-11-30 DIAGNOSIS — H353131 Nonexudative age-related macular degeneration, bilateral, early dry stage: Secondary | ICD-10-CM | POA: Diagnosis not present

## 2018-11-30 DIAGNOSIS — E119 Type 2 diabetes mellitus without complications: Secondary | ICD-10-CM | POA: Diagnosis not present

## 2018-11-30 DIAGNOSIS — Z961 Presence of intraocular lens: Secondary | ICD-10-CM | POA: Diagnosis not present

## 2019-01-18 DIAGNOSIS — E782 Mixed hyperlipidemia: Secondary | ICD-10-CM | POA: Diagnosis not present

## 2019-01-18 DIAGNOSIS — I10 Essential (primary) hypertension: Secondary | ICD-10-CM | POA: Diagnosis not present

## 2019-01-18 DIAGNOSIS — E119 Type 2 diabetes mellitus without complications: Secondary | ICD-10-CM | POA: Diagnosis not present

## 2019-01-20 DIAGNOSIS — E782 Mixed hyperlipidemia: Secondary | ICD-10-CM | POA: Diagnosis not present

## 2019-01-20 DIAGNOSIS — E1165 Type 2 diabetes mellitus with hyperglycemia: Secondary | ICD-10-CM | POA: Diagnosis not present

## 2019-01-20 DIAGNOSIS — I10 Essential (primary) hypertension: Secondary | ICD-10-CM | POA: Diagnosis not present

## 2019-01-20 DIAGNOSIS — L309 Dermatitis, unspecified: Secondary | ICD-10-CM | POA: Diagnosis not present

## 2019-02-15 DIAGNOSIS — I10 Essential (primary) hypertension: Secondary | ICD-10-CM | POA: Diagnosis not present

## 2019-02-15 DIAGNOSIS — E782 Mixed hyperlipidemia: Secondary | ICD-10-CM | POA: Diagnosis not present

## 2019-02-15 DIAGNOSIS — E1165 Type 2 diabetes mellitus with hyperglycemia: Secondary | ICD-10-CM | POA: Diagnosis not present

## 2019-02-26 DIAGNOSIS — M25561 Pain in right knee: Secondary | ICD-10-CM | POA: Diagnosis not present

## 2019-02-26 DIAGNOSIS — L509 Urticaria, unspecified: Secondary | ICD-10-CM | POA: Diagnosis not present

## 2019-03-16 DIAGNOSIS — I10 Essential (primary) hypertension: Secondary | ICD-10-CM | POA: Diagnosis not present

## 2019-03-16 DIAGNOSIS — E782 Mixed hyperlipidemia: Secondary | ICD-10-CM | POA: Diagnosis not present

## 2019-03-16 DIAGNOSIS — E1165 Type 2 diabetes mellitus with hyperglycemia: Secondary | ICD-10-CM | POA: Diagnosis not present

## 2019-04-14 DIAGNOSIS — E782 Mixed hyperlipidemia: Secondary | ICD-10-CM | POA: Diagnosis not present

## 2019-04-14 DIAGNOSIS — E1165 Type 2 diabetes mellitus with hyperglycemia: Secondary | ICD-10-CM | POA: Diagnosis not present

## 2019-04-14 DIAGNOSIS — I10 Essential (primary) hypertension: Secondary | ICD-10-CM | POA: Diagnosis not present

## 2019-04-22 DIAGNOSIS — I10 Essential (primary) hypertension: Secondary | ICD-10-CM | POA: Diagnosis not present

## 2019-04-22 DIAGNOSIS — E782 Mixed hyperlipidemia: Secondary | ICD-10-CM | POA: Diagnosis not present

## 2019-04-22 DIAGNOSIS — Z0000 Encounter for general adult medical examination without abnormal findings: Secondary | ICD-10-CM | POA: Diagnosis not present

## 2019-04-22 DIAGNOSIS — E1165 Type 2 diabetes mellitus with hyperglycemia: Secondary | ICD-10-CM | POA: Diagnosis not present

## 2019-04-27 DIAGNOSIS — E782 Mixed hyperlipidemia: Secondary | ICD-10-CM | POA: Diagnosis not present

## 2019-04-27 DIAGNOSIS — Z0000 Encounter for general adult medical examination without abnormal findings: Secondary | ICD-10-CM | POA: Diagnosis not present

## 2019-04-27 DIAGNOSIS — E1165 Type 2 diabetes mellitus with hyperglycemia: Secondary | ICD-10-CM | POA: Diagnosis not present

## 2019-04-27 DIAGNOSIS — I10 Essential (primary) hypertension: Secondary | ICD-10-CM | POA: Diagnosis not present

## 2019-05-10 DIAGNOSIS — E782 Mixed hyperlipidemia: Secondary | ICD-10-CM | POA: Diagnosis not present

## 2019-05-10 DIAGNOSIS — E1165 Type 2 diabetes mellitus with hyperglycemia: Secondary | ICD-10-CM | POA: Diagnosis not present

## 2019-05-10 DIAGNOSIS — I10 Essential (primary) hypertension: Secondary | ICD-10-CM | POA: Diagnosis not present

## 2019-05-13 IMAGING — DX DG KNEE COMPLETE 4+V*R*
4 series · 4 of 4 positions shown · non-contrast
Comparison: None.

CLINICAL DATA: Fell on steps today.  Pain.

EXAM:
RIGHT KNEE - COMPLETE 4+ VIEW

[knee ap (1 of 3)]
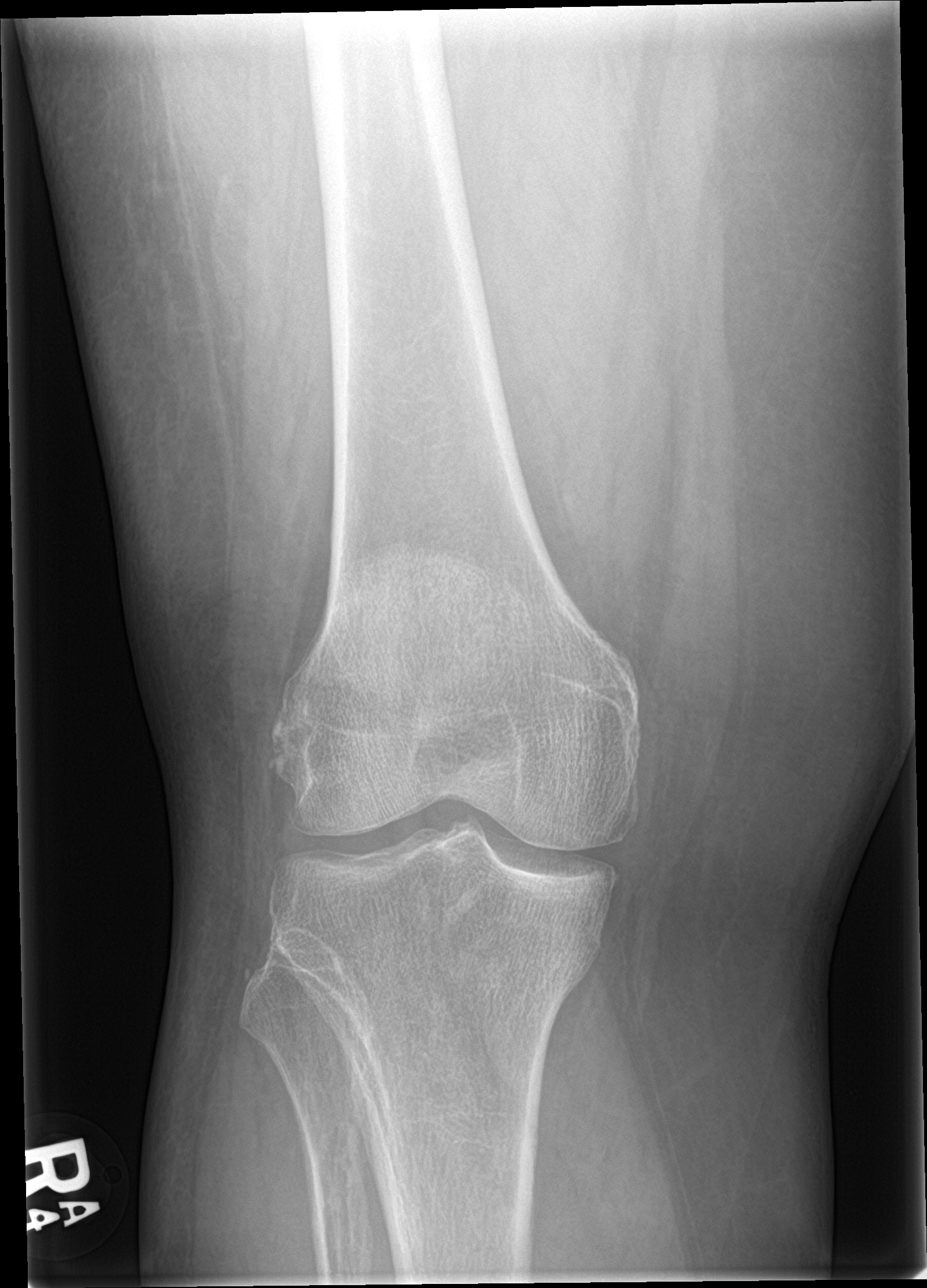

[knee ap (2 of 3)]
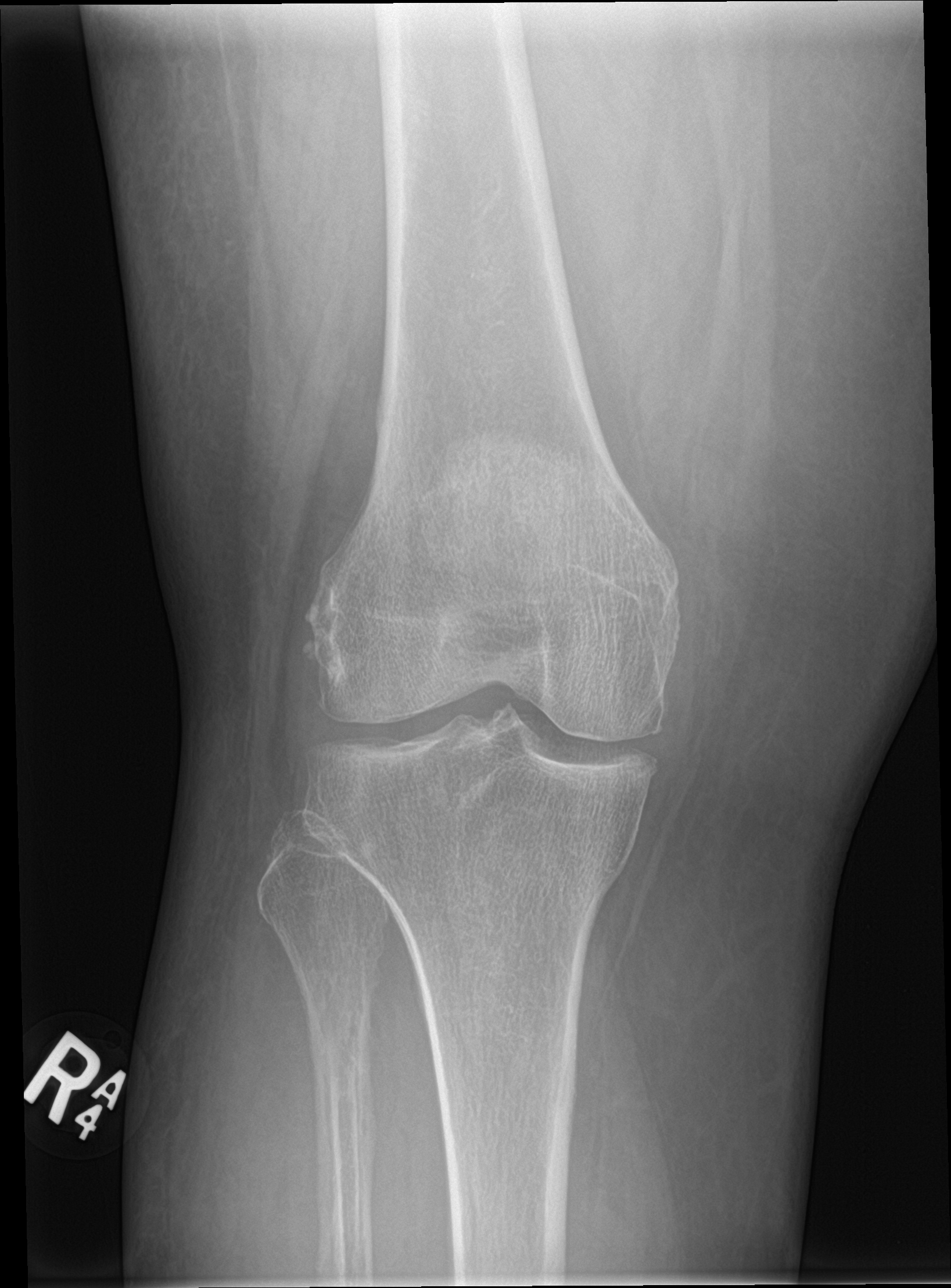

[knee ap (3 of 3)]
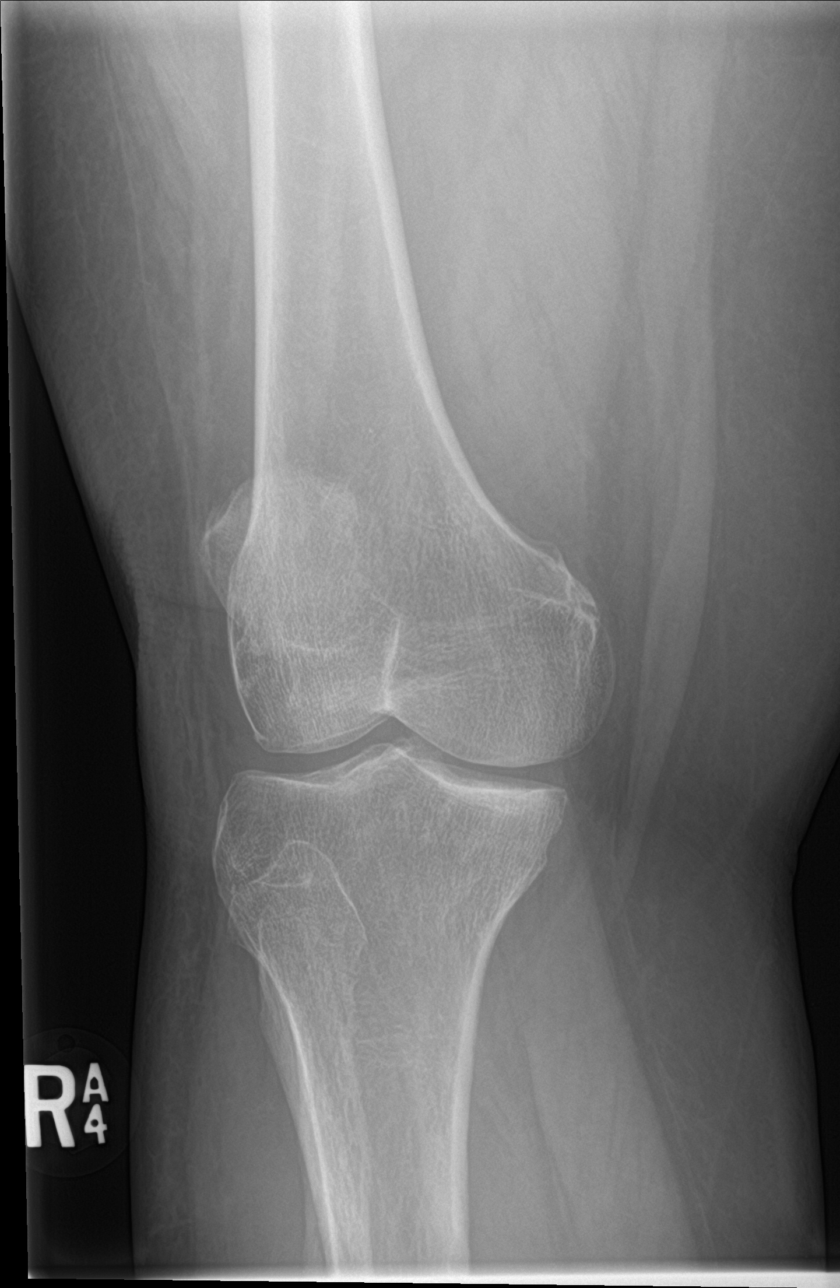

[knee lat]
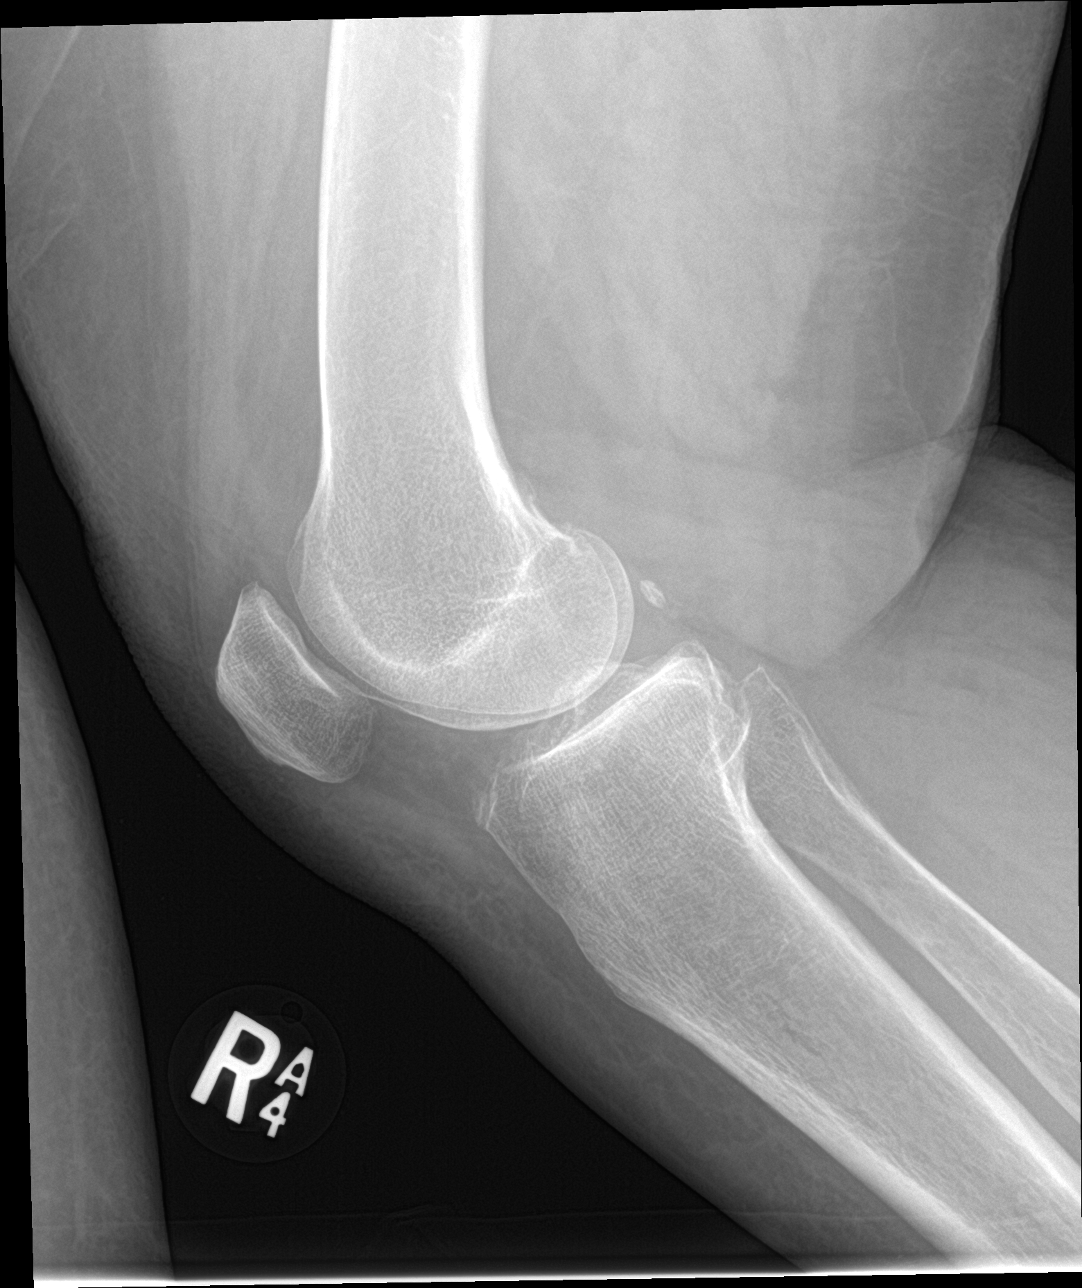

[4 of 4 positions shown; findings below may reference images not displayed]

FINDINGS: No evidence of fracture, dislocation, or joint effusion. No evidence
of arthropathy or other focal bone abnormality. Soft tissues are
unremarkable.
IMPRESSION: Negative.

## 2019-05-31 DIAGNOSIS — H16223 Keratoconjunctivitis sicca, not specified as Sjogren's, bilateral: Secondary | ICD-10-CM | POA: Diagnosis not present

## 2019-05-31 DIAGNOSIS — E119 Type 2 diabetes mellitus without complications: Secondary | ICD-10-CM | POA: Diagnosis not present

## 2019-05-31 DIAGNOSIS — H353131 Nonexudative age-related macular degeneration, bilateral, early dry stage: Secondary | ICD-10-CM | POA: Diagnosis not present

## 2019-05-31 DIAGNOSIS — Z961 Presence of intraocular lens: Secondary | ICD-10-CM | POA: Diagnosis not present

## 2019-06-30 DIAGNOSIS — E1165 Type 2 diabetes mellitus with hyperglycemia: Secondary | ICD-10-CM | POA: Diagnosis not present

## 2019-06-30 DIAGNOSIS — I10 Essential (primary) hypertension: Secondary | ICD-10-CM | POA: Diagnosis not present

## 2019-06-30 DIAGNOSIS — E782 Mixed hyperlipidemia: Secondary | ICD-10-CM | POA: Diagnosis not present

## 2019-07-28 DIAGNOSIS — I10 Essential (primary) hypertension: Secondary | ICD-10-CM | POA: Diagnosis not present

## 2019-07-28 DIAGNOSIS — Z0000 Encounter for general adult medical examination without abnormal findings: Secondary | ICD-10-CM | POA: Diagnosis not present

## 2019-07-28 DIAGNOSIS — E1143 Type 2 diabetes mellitus with diabetic autonomic (poly)neuropathy: Secondary | ICD-10-CM | POA: Diagnosis not present

## 2019-07-28 DIAGNOSIS — E1165 Type 2 diabetes mellitus with hyperglycemia: Secondary | ICD-10-CM | POA: Diagnosis not present

## 2019-07-28 DIAGNOSIS — E782 Mixed hyperlipidemia: Secondary | ICD-10-CM | POA: Diagnosis not present

## 2019-08-03 DIAGNOSIS — E782 Mixed hyperlipidemia: Secondary | ICD-10-CM | POA: Diagnosis not present

## 2019-08-03 DIAGNOSIS — E1165 Type 2 diabetes mellitus with hyperglycemia: Secondary | ICD-10-CM | POA: Diagnosis not present

## 2019-08-03 DIAGNOSIS — I10 Essential (primary) hypertension: Secondary | ICD-10-CM | POA: Diagnosis not present

## 2019-08-31 DIAGNOSIS — E782 Mixed hyperlipidemia: Secondary | ICD-10-CM | POA: Diagnosis not present

## 2019-08-31 DIAGNOSIS — I10 Essential (primary) hypertension: Secondary | ICD-10-CM | POA: Diagnosis not present

## 2019-08-31 DIAGNOSIS — E1165 Type 2 diabetes mellitus with hyperglycemia: Secondary | ICD-10-CM | POA: Diagnosis not present

## 2019-11-02 DIAGNOSIS — E782 Mixed hyperlipidemia: Secondary | ICD-10-CM | POA: Diagnosis not present

## 2019-11-02 DIAGNOSIS — I10 Essential (primary) hypertension: Secondary | ICD-10-CM | POA: Diagnosis not present

## 2019-11-02 DIAGNOSIS — E1165 Type 2 diabetes mellitus with hyperglycemia: Secondary | ICD-10-CM | POA: Diagnosis not present

## 2019-12-02 DIAGNOSIS — I10 Essential (primary) hypertension: Secondary | ICD-10-CM | POA: Diagnosis not present

## 2019-12-02 DIAGNOSIS — E1165 Type 2 diabetes mellitus with hyperglycemia: Secondary | ICD-10-CM | POA: Diagnosis not present

## 2019-12-02 DIAGNOSIS — E782 Mixed hyperlipidemia: Secondary | ICD-10-CM | POA: Diagnosis not present

## 2019-12-13 DIAGNOSIS — H16223 Keratoconjunctivitis sicca, not specified as Sjogren's, bilateral: Secondary | ICD-10-CM | POA: Diagnosis not present

## 2019-12-13 DIAGNOSIS — E119 Type 2 diabetes mellitus without complications: Secondary | ICD-10-CM | POA: Diagnosis not present

## 2019-12-13 DIAGNOSIS — H353131 Nonexudative age-related macular degeneration, bilateral, early dry stage: Secondary | ICD-10-CM | POA: Diagnosis not present

## 2019-12-13 DIAGNOSIS — Z961 Presence of intraocular lens: Secondary | ICD-10-CM | POA: Diagnosis not present

## 2020-01-10 DIAGNOSIS — L821 Other seborrheic keratosis: Secondary | ICD-10-CM | POA: Diagnosis not present

## 2020-01-10 DIAGNOSIS — D23122 Other benign neoplasm of skin of left lower eyelid, including canthus: Secondary | ICD-10-CM | POA: Diagnosis not present

## 2020-01-12 DIAGNOSIS — E782 Mixed hyperlipidemia: Secondary | ICD-10-CM | POA: Diagnosis not present

## 2020-01-12 DIAGNOSIS — E1165 Type 2 diabetes mellitus with hyperglycemia: Secondary | ICD-10-CM | POA: Diagnosis not present

## 2020-01-12 DIAGNOSIS — I10 Essential (primary) hypertension: Secondary | ICD-10-CM | POA: Diagnosis not present

## 2020-02-01 DIAGNOSIS — I10 Essential (primary) hypertension: Secondary | ICD-10-CM | POA: Diagnosis not present

## 2020-02-01 DIAGNOSIS — J40 Bronchitis, not specified as acute or chronic: Secondary | ICD-10-CM | POA: Diagnosis not present

## 2020-02-01 DIAGNOSIS — E1143 Type 2 diabetes mellitus with diabetic autonomic (poly)neuropathy: Secondary | ICD-10-CM | POA: Diagnosis not present

## 2020-02-01 DIAGNOSIS — E782 Mixed hyperlipidemia: Secondary | ICD-10-CM | POA: Diagnosis not present

## 2020-02-04 DIAGNOSIS — I10 Essential (primary) hypertension: Secondary | ICD-10-CM | POA: Diagnosis not present

## 2020-02-04 DIAGNOSIS — E782 Mixed hyperlipidemia: Secondary | ICD-10-CM | POA: Diagnosis not present

## 2020-02-04 DIAGNOSIS — E1143 Type 2 diabetes mellitus with diabetic autonomic (poly)neuropathy: Secondary | ICD-10-CM | POA: Diagnosis not present

## 2020-03-25 DIAGNOSIS — E1129 Type 2 diabetes mellitus with other diabetic kidney complication: Secondary | ICD-10-CM | POA: Diagnosis not present

## 2020-03-25 DIAGNOSIS — E7849 Other hyperlipidemia: Secondary | ICD-10-CM | POA: Diagnosis not present

## 2020-03-25 DIAGNOSIS — I10 Essential (primary) hypertension: Secondary | ICD-10-CM | POA: Diagnosis not present

## 2020-05-04 DIAGNOSIS — E782 Mixed hyperlipidemia: Secondary | ICD-10-CM | POA: Diagnosis not present

## 2020-05-04 DIAGNOSIS — I10 Essential (primary) hypertension: Secondary | ICD-10-CM | POA: Diagnosis not present

## 2020-05-04 DIAGNOSIS — E1143 Type 2 diabetes mellitus with diabetic autonomic (poly)neuropathy: Secondary | ICD-10-CM | POA: Diagnosis not present

## 2020-07-19 DIAGNOSIS — R062 Wheezing: Secondary | ICD-10-CM | POA: Diagnosis not present

## 2020-07-19 DIAGNOSIS — J329 Chronic sinusitis, unspecified: Secondary | ICD-10-CM | POA: Diagnosis not present

## 2020-07-19 DIAGNOSIS — R059 Cough, unspecified: Secondary | ICD-10-CM | POA: Diagnosis not present

## 2020-07-19 DIAGNOSIS — R52 Pain, unspecified: Secondary | ICD-10-CM | POA: Diagnosis not present

## 2020-08-02 DIAGNOSIS — H353221 Exudative age-related macular degeneration, left eye, with active choroidal neovascularization: Secondary | ICD-10-CM | POA: Diagnosis not present

## 2020-08-02 DIAGNOSIS — E113213 Type 2 diabetes mellitus with mild nonproliferative diabetic retinopathy with macular edema, bilateral: Secondary | ICD-10-CM | POA: Diagnosis not present

## 2020-08-08 DIAGNOSIS — H353221 Exudative age-related macular degeneration, left eye, with active choroidal neovascularization: Secondary | ICD-10-CM | POA: Diagnosis not present

## 2020-08-08 DIAGNOSIS — H35033 Hypertensive retinopathy, bilateral: Secondary | ICD-10-CM | POA: Diagnosis not present

## 2020-08-08 DIAGNOSIS — H353112 Nonexudative age-related macular degeneration, right eye, intermediate dry stage: Secondary | ICD-10-CM | POA: Diagnosis not present

## 2020-08-08 DIAGNOSIS — H43813 Vitreous degeneration, bilateral: Secondary | ICD-10-CM | POA: Diagnosis not present

## 2020-08-17 DIAGNOSIS — Z0000 Encounter for general adult medical examination without abnormal findings: Secondary | ICD-10-CM | POA: Diagnosis not present

## 2020-08-17 DIAGNOSIS — I10 Essential (primary) hypertension: Secondary | ICD-10-CM | POA: Diagnosis not present

## 2020-08-17 DIAGNOSIS — E1143 Type 2 diabetes mellitus with diabetic autonomic (poly)neuropathy: Secondary | ICD-10-CM | POA: Diagnosis not present

## 2020-08-17 DIAGNOSIS — E782 Mixed hyperlipidemia: Secondary | ICD-10-CM | POA: Diagnosis not present

## 2020-09-12 DIAGNOSIS — H353221 Exudative age-related macular degeneration, left eye, with active choroidal neovascularization: Secondary | ICD-10-CM | POA: Diagnosis not present

## 2020-09-12 DIAGNOSIS — H353112 Nonexudative age-related macular degeneration, right eye, intermediate dry stage: Secondary | ICD-10-CM | POA: Diagnosis not present

## 2020-10-17 DIAGNOSIS — H353112 Nonexudative age-related macular degeneration, right eye, intermediate dry stage: Secondary | ICD-10-CM | POA: Diagnosis not present

## 2020-10-17 DIAGNOSIS — H353221 Exudative age-related macular degeneration, left eye, with active choroidal neovascularization: Secondary | ICD-10-CM | POA: Diagnosis not present

## 2020-10-19 ENCOUNTER — Other Ambulatory Visit: Payer: Self-pay

## 2020-10-19 ENCOUNTER — Ambulatory Visit: Payer: Medicare Other | Admitting: Pulmonary Disease

## 2020-10-19 ENCOUNTER — Encounter: Payer: Self-pay | Admitting: Pulmonary Disease

## 2020-10-19 ENCOUNTER — Telehealth: Payer: Self-pay | Admitting: Pulmonary Disease

## 2020-10-19 VITALS — BP 142/70 | HR 74 | Temp 99.4°F | Ht 59.0 in | Wt 191.0 lb

## 2020-10-19 DIAGNOSIS — J411 Mucopurulent chronic bronchitis: Secondary | ICD-10-CM | POA: Diagnosis not present

## 2020-10-19 MED ORDER — FLUTICASONE PROPIONATE 50 MCG/ACT NA SUSP: 1.0000 | Freq: Every day | NASAL | 2 refills | Status: AC

## 2020-10-19 MED ORDER — BUDESONIDE-FORMOTEROL FUMARATE 80-4.5 MCG/ACT IN AERO: 2.0000 | INHALATION_SPRAY | Freq: Two times a day (BID) | RESPIRATORY_TRACT | 11 refills | Status: DC

## 2020-10-19 NOTE — Patient Instructions (Signed)
Symbicort two puffs in the morning and two puffs at night, and rinse your mouth after each use.  Will arrange for spacer device to use with symbicort.  Combivent 2 puffs every 6 hours as needed for cough, wheeze, or chest congestion.  Flonase 1 spray in each nostril nightly.  Will arrange for lab tests, chest xray, sputum gram stain and culture.  Will arrange for pulmonary function test in [place] office or [facility] - whichever can be scheduled sooner.  Follow up in 2 months in [place] office.

## 2020-10-19 NOTE — Progress Notes (Signed)
[facility]  Chief Complaint  Patient presents with   Pulmonary Consult    Referred by Dr Clide Deutscher.  Pt c/o cough for the past 6 months- prod with yellow to green sputum. She also c/o runny nose. Cough especially bothers her at night. She states she hears "rattling"coming from her chest and her throat. She uses combivent inh about once per wk.     Constitutional:  BP (!) 142/70 (BP Location: Left Arm, Cuff Size: Normal)   Pulse 74   Temp 99.4 F (37.4 C) (Oral)   Ht '4\' 11"'$  (1.499 m)   Wt 191 lb (86.6 kg)   SpO2 92% Comment: on RA  BMI 38.58 kg/m   Past Medical History:  OA, DM type 2, GERD, Headaches, HLD, HTN, Macular degeneration, Neuropathy, Osteopenia, Stress incontinence  Past Surgical History:  She  has a past surgical history that includes Colonoscopy (05/2013); colon tumor removed (2010); Colonoscopy (2011); Colonoscopy (10/2003); Esophagogastroduodenoscopy (N/A, 04/29/2016); Savory dilation (N/A, 04/29/2016); Cataract extraction w/PHACO (Left, 11/01/2016); Cataract extraction w/PHACO (Right, 11/29/2016); Breast lumpectomy (Bilateral); Carpal tunnel release (Left, 10/23/2017); and Carpal tunnel release (Right, 11/20/2017).  Brief Summary:  Michaela Moon is a 85 y.o. female former smoker with a cough.       Subjective:   She is here with her son.  She is from [facility]  She smoked 2 packs per day and quit in 2004.  Her parents had COPD.  She had pneumonia years ago.  No history of TB.  She has a International aid/development worker.  She has cough with clear to yellow sputum.  Has been present for couple so years and getting worse.  She has more cough at night, but happens during the day also.  She has nasal congestion and post nasal drip.  No reflux symptoms at present.  Has wheeze and rattle in her chest.  She has to stop and rest when walking at the grocery store, but some of this is from back pain also.  She snores some.  No skin  rashes, or joint swelling.  Denies fever, sweats, or weight loss.  Her feet swell toward the end of the day.  Physical Exam:   Appearance - well kempt   ENMT - no sinus tenderness, no oral exudate, no LAN, Mallampati 3 airway, no stridor, wears dentures, clear nasal discharge  Respiratory - b/l crackles that clear with coughing, scattered wheeze  CV - s1s2 regular rate and rhythm, no murmurs  Ext - no clubbing, no edema  Skin - no rashes  Psych - normal mood and affect   Pulmonary testing:    Chest Imaging:    Sleep Tests:    Social History:  She  reports that she quit smoking about 18 years ago. Her smoking use included cigarettes. She has a 40.00 pack-year smoking history. She has never used smokeless tobacco. She reports that she does not drink alcohol and does not use drugs.  Family History:  Her family history includes Melanoma in her brother.    Discussion:  She has extensive prior history of smoking and has family history of COPD.  She has chronic cough with mucopurulent secretions.  She could have COPD and/or asthma.  She also has chronic rhinitis with post nasal drip likely contributing to her cough.  Assessment/Plan:   Chronic mucopurulent bronchitis. - will arrange for sputum gram stain and culture, chest xray, pulmonary function test, CBC with diff, IgE, and CMET -  will have her start symbicort 80 two puffs bid, and use with a spacer device - combivent two puffs q6h  Chronic rhinitis with post nasal drip. - flonase 1 spray each nostril nighlty  Time Spent Involved in Patient Care on Day of Examination:  46 minutes  Follow up:   Patient Instructions  Symbicort two puffs in the morning and two puffs at night, and rinse your mouth after each use.  Will arrange for spacer device to use with symbicort.  Combivent 2 puffs every 6 hours as needed for cough, wheeze, or chest congestion.  Flonase 1 spray in each nostril nightly.  Will arrange for lab  tests, chest xray, sputum gram stain and culture.  Will arrange for pulmonary function test in [place] office or [facility] - whichever can be scheduled sooner.  Follow up in 2 months in [place] office.   Medication List:   Allergies as of 10/19/2020       Reactions   Onion    wheezing        Medication List        Accurate as of October 19, 2020 11:00 AM. If you have any questions, ask your nurse or doctor.          acetaminophen-codeine 300-30 MG tablet Commonly known as: TYLENOL #3 Take 1 tablet by mouth every 6 (six) hours as needed for moderate pain.   albuterol-ipratropium 18-103 MCG/ACT inhaler Commonly known as: COMBIVENT Inhale 2 puffs into the lungs every 6 (six) hours as needed for wheezing.   amLODipine 5 MG tablet Commonly known as: NORVASC Take 5 mg by mouth daily.   ARTIFICIAL TEARS OP Place 1 drop into both eyes 4 (four) times daily.   aspirin 81 MG tablet Take 81 mg by mouth daily.   atorvastatin 80 MG tablet Commonly known as: LIPITOR ALTERNATE TAKING 1 TABLET EVERY OTHER DAY WITH 1/2 TABLET EVERY OTHERDAY   budesonide-formoterol 80-4.5 MCG/ACT inhaler Commonly known as: Symbicort Inhale 2 puffs into the lungs 2 (two) times daily. Started by: Chesley Mires, MD   Fish Oil 1000 MG Caps Take 1,000 mg by mouth daily.   fluticasone 50 MCG/ACT nasal spray Commonly known as: FLONASE Place 1 spray into both nostrils daily. Started by: Chesley Mires, MD   gabapentin 400 MG capsule Commonly known as: NEURONTIN Take 400 mg by mouth 3 (three) times daily.   glimepiride 2 MG tablet Commonly known as: AMARYL Take 4 mg by mouth daily with breakfast.   losartan-hydrochlorothiazide 100-12.5 MG tablet Commonly known as: HYZAAR TAKE ONE (1) TABLET EACH DAY   meloxicam 15 MG tablet Commonly known as: MOBIC Take 15 mg by mouth daily.   metFORMIN 1000 MG tablet Commonly known as: GLUCOPHAGE TAKE ONE TABLET TWICE A DAY WITH FOOD    metoprolol tartrate 50 MG tablet Commonly known as: LOPRESSOR Take 50 mg by mouth 2 (two) times daily.   oxybutynin 5 MG tablet Commonly known as: DITROPAN Take 5 mg by mouth every 8 (eight) hours as needed for bladder spasms.   ranitidine 150 MG tablet Commonly known as: ZANTAC Take 150 mg by mouth 2 (two) times daily.   sitaGLIPtin 100 MG tablet Commonly known as: JANUVIA Take 1 tablet (100 mg total) by mouth daily.   triamcinolone cream 0.1 % Commonly known as: KENALOG Apply 1 application topically daily as needed (eczema).        Signature:  Chesley Mires, MD [place] Pager - [phone] 10/19/2020,  11:00 AM

## 2020-10-19 NOTE — Telephone Encounter (Signed)
Call returned to pharmacy, confirmed DOB. Script updated.   Nothing further needed at this time.

## 2020-10-25 ENCOUNTER — Ambulatory Visit (HOSPITAL_COMMUNITY)
Admission: RE | Admit: 2020-10-25 | Discharge: 2020-10-25 | Disposition: A | Discharge status: Home / Self Care | Payer: Medicare Other | Source: Ambulatory Visit | Attending: Pulmonary Disease | Admitting: Pulmonary Disease

## 2020-10-25 ENCOUNTER — Other Ambulatory Visit: Payer: Self-pay

## 2020-10-25 DIAGNOSIS — J449 Chronic obstructive pulmonary disease, unspecified: Secondary | ICD-10-CM | POA: Diagnosis not present

## 2020-10-25 DIAGNOSIS — R0602 Shortness of breath: Secondary | ICD-10-CM | POA: Diagnosis not present

## 2020-10-25 DIAGNOSIS — J411 Mucopurulent chronic bronchitis: Secondary | ICD-10-CM | POA: Insufficient documentation

## 2020-10-25 DIAGNOSIS — E1143 Type 2 diabetes mellitus with diabetic autonomic (poly)neuropathy: Secondary | ICD-10-CM | POA: Diagnosis not present

## 2020-10-25 DIAGNOSIS — J811 Chronic pulmonary edema: Secondary | ICD-10-CM | POA: Diagnosis not present

## 2020-10-25 DIAGNOSIS — E782 Mixed hyperlipidemia: Secondary | ICD-10-CM | POA: Diagnosis not present

## 2020-10-25 DIAGNOSIS — J9811 Atelectasis: Secondary | ICD-10-CM | POA: Diagnosis not present

## 2020-10-25 DIAGNOSIS — R059 Cough, unspecified: Secondary | ICD-10-CM | POA: Diagnosis not present

## 2020-10-25 DIAGNOSIS — I10 Essential (primary) hypertension: Secondary | ICD-10-CM | POA: Diagnosis not present

## 2020-10-25 LAB — CBC WITH DIFFERENTIAL/PLATELET
Basophils Absolute: 0 10*3/uL (ref 0.0–0.2)
Basos: 1 %
EOS (ABSOLUTE): 0.3 10*3/uL (ref 0.0–0.4)
Eos: 4 %
Hematocrit: 34.5 % (ref 34.0–46.6)
Hemoglobin: 10.9 g/dL — ABNORMAL LOW (ref 11.1–15.9)
Immature Grans (Abs): 0 10*3/uL (ref 0.0–0.1)
Immature Granulocytes: 0 %
Lymphocytes Absolute: 2.2 10*3/uL (ref 0.7–3.1)
Lymphs: 33 %
MCH: 26.7 pg (ref 26.6–33.0)
MCHC: 31.6 g/dL (ref 31.5–35.7)
MCV: 85 fL (ref 79–97)
Monocytes Absolute: 0.7 10*3/uL (ref 0.1–0.9)
Monocytes: 11 %
Neutrophils Absolute: 3.3 10*3/uL (ref 1.4–7.0)
Neutrophils: 51 %
Platelets: 188 10*3/uL (ref 150–450)
RBC: 4.08 x10E6/uL (ref 3.77–5.28)
RDW: 13.5 % (ref 11.7–15.4)
WBC: 6.6 10*3/uL (ref 3.4–10.8)

## 2020-10-25 LAB — COMPREHENSIVE METABOLIC PANEL
ALT: 21 IU/L (ref 0–32)
AST: 23 IU/L (ref 0–40)
Albumin/Globulin Ratio: 1.4 (ref 1.2–2.2)
Albumin: 4.2 g/dL (ref 3.6–4.6)
Alkaline Phosphatase: 52 IU/L (ref 44–121)
BUN/Creatinine Ratio: 14 (ref 12–28)
BUN: 12 mg/dL (ref 8–27)
Bilirubin Total: 0.3 mg/dL (ref 0.0–1.2)
CO2: 23 mmol/L (ref 20–29)
Calcium: 10.5 mg/dL — ABNORMAL HIGH (ref 8.7–10.3)
Chloride: 102 mmol/L (ref 96–106)
Creatinine, Ser: 0.83 mg/dL (ref 0.57–1.00)
Globulin, Total: 3.1 g/dL (ref 1.5–4.5)
Glucose: 111 mg/dL — ABNORMAL HIGH (ref 65–99)
Potassium: 4.2 mmol/L (ref 3.5–5.2)
Sodium: 139 mmol/L (ref 134–144)
Total Protein: 7.3 g/dL (ref 6.0–8.5)
eGFR: 70 mL/min/{1.73_m2} (ref >59)

## 2020-10-25 LAB — IGE: IgE (Immunoglobulin E), Serum: 13 IU/mL (ref 6–495)

## 2020-10-26 LAB — RESPIRATORY CULTURE OR RESPIRATORY AND SPUTUM CULTURE: MICRO NUMBER:: 12317434

## 2020-11-02 ENCOUNTER — Telehealth: Payer: Self-pay | Admitting: Pulmonary Disease

## 2020-11-02 NOTE — Telephone Encounter (Signed)
Called and spoke with [name] patient's son per DPR to let him know of message from Dr. Halford Chessman about sputum sample. He expressed understanding and said he would call back if they needed to repeat sputum sample. Nothing further needed at this time.

## 2020-11-02 NOTE — Telephone Encounter (Signed)
Called and spoke with patient who is calling about the results of her sputum sample. I see results of blood work but not Sputum.   Dr. Halford Chessman please advise

## 2020-11-02 NOTE — Telephone Encounter (Signed)
Sputum specimen was contaminated with saliva, and was not an acceptable sample.    If she is still having significant cough and sputum production, then please arrange for repeat sputum culture.

## 2020-11-14 DIAGNOSIS — H353221 Exudative age-related macular degeneration, left eye, with active choroidal neovascularization: Secondary | ICD-10-CM | POA: Diagnosis not present

## 2020-11-14 DIAGNOSIS — H35033 Hypertensive retinopathy, bilateral: Secondary | ICD-10-CM | POA: Diagnosis not present

## 2020-11-14 DIAGNOSIS — H43813 Vitreous degeneration, bilateral: Secondary | ICD-10-CM | POA: Diagnosis not present

## 2020-11-14 DIAGNOSIS — H353112 Nonexudative age-related macular degeneration, right eye, intermediate dry stage: Secondary | ICD-10-CM | POA: Diagnosis not present

## 2020-11-22 DIAGNOSIS — I10 Essential (primary) hypertension: Secondary | ICD-10-CM | POA: Diagnosis not present

## 2020-11-22 DIAGNOSIS — I700 Atherosclerosis of aorta: Secondary | ICD-10-CM | POA: Diagnosis not present

## 2020-11-22 DIAGNOSIS — E1143 Type 2 diabetes mellitus with diabetic autonomic (poly)neuropathy: Secondary | ICD-10-CM | POA: Diagnosis not present

## 2020-11-22 DIAGNOSIS — E782 Mixed hyperlipidemia: Secondary | ICD-10-CM | POA: Diagnosis not present

## 2020-11-22 DIAGNOSIS — Z0000 Encounter for general adult medical examination without abnormal findings: Secondary | ICD-10-CM | POA: Diagnosis not present

## 2020-12-12 ENCOUNTER — Other Ambulatory Visit: Payer: Self-pay

## 2020-12-12 ENCOUNTER — Ambulatory Visit (INDEPENDENT_AMBULATORY_CARE_PROVIDER_SITE_OTHER): Payer: Medicare Other | Admitting: Pulmonary Disease

## 2020-12-12 ENCOUNTER — Encounter: Payer: Self-pay | Admitting: Pulmonary Disease

## 2020-12-12 VITALS — BP 150/70 | HR 76 | Temp 98.9°F | Ht 60.0 in | Wt 188.8 lb

## 2020-12-12 DIAGNOSIS — J411 Mucopurulent chronic bronchitis: Secondary | ICD-10-CM

## 2020-12-12 MED ORDER — PREDNISONE 10 MG PO TABS: ORAL_TABLET | ORAL | 0 refills | Status: AC

## 2020-12-12 MED ORDER — IPRATROPIUM BROMIDE 0.03 % NA SOLN: 2.0000 | Freq: Three times a day (TID) | NASAL | 12 refills | Status: DC | PRN

## 2020-12-12 MED ORDER — BREZTRI AEROSPHERE 160-9-4.8 MCG/ACT IN AERO: 2.0000 | INHALATION_SPRAY | Freq: Two times a day (BID) | RESPIRATORY_TRACT | 0 refills | Status: DC

## 2020-12-12 MED ORDER — AMOXICILLIN-POT CLAVULANATE 875-125 MG PO TABS: 1.0000 | ORAL_TABLET | Freq: Two times a day (BID) | ORAL | 0 refills | Status: DC

## 2020-12-12 NOTE — Addendum Note (Signed)
Addended by: Vanessa Barbara on: 12/12/2020 02:16 PM   Modules accepted: Orders

## 2020-12-12 NOTE — Progress Notes (Signed)
[facility]  Chief Complaint  Patient presents with   Follow-up    Here to get results of PFT.    Constitutional:  BP (!) 150/70 (BP Location: Right Arm, Patient Position: Sitting, Cuff Size: Normal)   Pulse 76   Temp 98.9 F (37.2 C) (Oral)   Ht 5' (1.524 m)   Wt 188 lb 12.8 oz (85.6 kg)   SpO2 91%   BMI 36.87 kg/m   Past Medical History:  OA, DM type 2, GERD, Headaches, HLD, HTN, Macular degeneration, Neuropathy, Osteopenia, Stress incontinence  Past Surgical History:  She  has a past surgical history that includes Colonoscopy (05/2013); colon tumor removed (2010); Colonoscopy (2011); Colonoscopy (10/2003); Esophagogastroduodenoscopy (N/A, 04/29/2016); Savory dilation (N/A, 04/29/2016); Cataract extraction w/PHACO (Left, 11/01/2016); Cataract extraction w/PHACO (Right, 11/29/2016); Breast lumpectomy (Bilateral); Carpal tunnel release (Left, 10/23/2017); and Carpal tunnel release (Right, 11/20/2017).  Brief Summary:  Michaela Moon is a 85 y.o. female former smoker with a cough.       Subjective:   She is here with her son.  Chest xray from 10/25/20 showed changes of COPD.  She wasn't able to do PFT this morning.  Had trouble with test maneuver.  She feels symbicort has helped.  Still has sinus congestion, chest congestion, wheeze, and cough.  Bringing up clear to yellow sputum.  No fever, hemoptysis, chest pain, or leg swelling.  Previous sputum sample rejected due to oral contamination.  Physical Exam:   Appearance - well kempt   ENMT - no sinus tenderness, no oral exudate, no LAN, Mallampati 3 airway, no stridor  Respiratory - b/l crackles that clear with cough  CV - s1s2 regular rate and rhythm, no murmurs  Ext - no clubbing, no edema  Skin - no rashes  Psych - normal mood and affect    Pulmonary testing:  IgE 10/19/20 >> 13  Chest Imaging:    Social History:  She  reports that she quit smoking about 18 years  ago. Her smoking use included cigarettes. She has a 40.00 pack-year smoking history. She has never used smokeless tobacco. She reports that she does not drink alcohol and does not use drugs.  Family History:  Her family history includes Melanoma in her brother.     Assessment/Plan:   Chronic mucopurulent bronchitis. - she wasn't able to complete PFT - will give course of augmentin and prednisone - will have her try sample of breztri in place of symbicort - she has spacer device - prn combivent  Chronic rhinitis with post nasal drip. - continue flonase 1 spray each nostril daily - prn atrovent nasal spray  Time Spent Involved in Patient Care on Day of Examination:  32 minutes  Follow up:   Patient Instructions  Augmentin antibiotic twice per day for 7 days  Prednisone 10 mg pill >> 3 pills daily for 2 days, 2 pills daily for 2 days, 1 pill daily for 2 days  Try sample of breztri two puffs in the morning and two puffs in the evening.  Use with spacer device and rinse mouth after each use.  Call for refill if you feel that brezri works better than symbicort.  Don't use symbicort while using breztri.  Atrovent nasal spray every 8 hours as needed for runny nose.  Follow up in [place] office in 2 months.  Medication List:   Allergies as of 12/12/2020       Reactions   Onion  wheezing        Medication List        Accurate as of December 12, 2020 11:03 AM. If you have any questions, ask your nurse or doctor.          acetaminophen-codeine 300-30 MG tablet Commonly known as: TYLENOL #3 Take 1 tablet by mouth every 6 (six) hours as needed for moderate pain.   albuterol-ipratropium 18-103 MCG/ACT inhaler Commonly known as: COMBIVENT Inhale 2 puffs into the lungs every 6 (six) hours as needed for wheezing.   amLODipine 5 MG tablet Commonly known as: NORVASC Take 5 mg by mouth daily.   amoxicillin-clavulanate 875-125 MG tablet Commonly known as:  AUGMENTIN Take 1 tablet by mouth 2 (two) times daily. Started by: Chesley Mires, MD   ARTIFICIAL TEARS OP Place 1 drop into both eyes 4 (four) times daily.   aspirin 81 MG tablet Take 81 mg by mouth daily.   atorvastatin 80 MG tablet Commonly known as: LIPITOR ALTERNATE TAKING 1 TABLET EVERY OTHER DAY WITH 1/2 TABLET EVERY OTHERDAY   budesonide-formoterol 80-4.5 MCG/ACT inhaler Commonly known as: Symbicort Inhale 2 puffs into the lungs 2 (two) times daily.   Fish Oil 1000 MG Caps Take 1,000 mg by mouth daily.   fluticasone 50 MCG/ACT nasal spray Commonly known as: FLONASE Place 1 spray into both nostrils daily.   gabapentin 400 MG capsule Commonly known as: NEURONTIN Take 400 mg by mouth 3 (three) times daily.   glimepiride 2 MG tablet Commonly known as: AMARYL Take 4 mg by mouth daily with breakfast.   ipratropium 0.03 % nasal spray Commonly known as: ATROVENT Place 2 sprays into both nostrils 3 (three) times daily as needed for rhinitis. Started by: Chesley Mires, MD   losartan-hydrochlorothiazide 100-12.5 MG tablet Commonly known as: HYZAAR TAKE ONE (1) TABLET EACH DAY   meloxicam 15 MG tablet Commonly known as: MOBIC Take 15 mg by mouth daily.   metFORMIN 1000 MG tablet Commonly known as: GLUCOPHAGE TAKE ONE TABLET TWICE A DAY WITH FOOD   metoprolol tartrate 50 MG tablet Commonly known as: LOPRESSOR Take 50 mg by mouth 2 (two) times daily.   oxybutynin 5 MG tablet Commonly known as: DITROPAN Take 5 mg by mouth every 8 (eight) hours as needed for bladder spasms.   predniSONE 10 MG tablet Commonly known as: DELTASONE Take 3 tablets (30 mg total) by mouth daily with breakfast for 2 days, THEN 2 tablets (20 mg total) daily with breakfast for 2 days, THEN 1 tablet (10 mg total) daily with breakfast for 2 days. Start taking on: December 12, 2020 Started by: Chesley Mires, MD   ranitidine 150 MG tablet Commonly known as: ZANTAC Take 150 mg by mouth 2 (two)  times daily.   sitaGLIPtin 100 MG tablet Commonly known as: JANUVIA Take 1 tablet (100 mg total) by mouth daily.   triamcinolone cream 0.1 % Commonly known as: KENALOG Apply 1 application topically daily as needed (eczema).        Signature:  Chesley Mires, MD [place] Pager - [phone] 12/12/2020, 11:03 AM

## 2020-12-12 NOTE — Patient Instructions (Signed)
Augmentin antibiotic twice per day for 7 days  Prednisone 10 mg pill >> 3 pills daily for 2 days, 2 pills daily for 2 days, 1 pill daily for 2 days  Try sample of breztri two puffs in the morning and two puffs in the evening.  Use with spacer device and rinse mouth after each use.  Call for refill if you feel that brezri works better than symbicort.  Don't use symbicort while using breztri.  Atrovent nasal spray every 8 hours as needed for runny nose.  Follow up in [place] office in 2 months.

## 2020-12-25 ENCOUNTER — Telehealth: Payer: Self-pay | Admitting: Pulmonary Disease

## 2020-12-26 DIAGNOSIS — H353112 Nonexudative age-related macular degeneration, right eye, intermediate dry stage: Secondary | ICD-10-CM | POA: Diagnosis not present

## 2020-12-26 DIAGNOSIS — H353221 Exudative age-related macular degeneration, left eye, with active choroidal neovascularization: Secondary | ICD-10-CM | POA: Diagnosis not present

## 2020-12-26 MED ORDER — BREZTRI AEROSPHERE 160-9-4.8 MCG/ACT IN AERO: 2.0000 | INHALATION_SPRAY | Freq: Two times a day (BID) | RESPIRATORY_TRACT | 5 refills | Status: DC

## 2020-12-26 NOTE — Telephone Encounter (Signed)
Called and spoke with pt's son [name] who stated he needed to have Rx for breztri sent to pharmacy for pt. Verified preferred pharmacy and have sent Rx to pharmacy for pt. Nothing further needed.

## 2020-12-26 NOTE — Telephone Encounter (Signed)
Michaela Moon son is returning phone call. Michaela Moon phone number is [phone].

## 2020-12-26 NOTE — Telephone Encounter (Signed)
I called the pt and there was no answer- LMTCB. ?

## 2021-02-01 DIAGNOSIS — H16223 Keratoconjunctivitis sicca, not specified as Sjogren's, bilateral: Secondary | ICD-10-CM | POA: Diagnosis not present

## 2021-02-01 DIAGNOSIS — E113213 Type 2 diabetes mellitus with mild nonproliferative diabetic retinopathy with macular edema, bilateral: Secondary | ICD-10-CM | POA: Diagnosis not present

## 2021-02-01 DIAGNOSIS — H26493 Other secondary cataract, bilateral: Secondary | ICD-10-CM | POA: Diagnosis not present

## 2021-02-01 DIAGNOSIS — H353221 Exudative age-related macular degeneration, left eye, with active choroidal neovascularization: Secondary | ICD-10-CM | POA: Diagnosis not present

## 2021-02-14 ENCOUNTER — Encounter: Payer: Self-pay | Admitting: Pulmonary Disease

## 2021-02-14 ENCOUNTER — Ambulatory Visit: Payer: Medicare Other | Admitting: Pulmonary Disease

## 2021-02-14 ENCOUNTER — Other Ambulatory Visit: Payer: Self-pay

## 2021-02-14 VITALS — BP 130/82 | HR 68 | Temp 98.4°F | Ht 61.0 in | Wt 186.1 lb

## 2021-02-14 DIAGNOSIS — J479 Bronchiectasis, uncomplicated: Secondary | ICD-10-CM | POA: Diagnosis not present

## 2021-02-14 DIAGNOSIS — J411 Mucopurulent chronic bronchitis: Secondary | ICD-10-CM

## 2021-02-14 DIAGNOSIS — J310 Chronic rhinitis: Secondary | ICD-10-CM

## 2021-02-14 MED ORDER — GUAIFENESIN ER 600 MG PO TB12: 1200.0000 mg | ORAL_TABLET | Freq: Two times a day (BID) | ORAL | Status: DC | PRN

## 2021-02-14 MED ORDER — MONTELUKAST SODIUM 10 MG PO TABS: 10.0000 mg | ORAL_TABLET | Freq: Every day | ORAL | 5 refills | Status: DC

## 2021-02-14 MED ORDER — IPRATROPIUM BROMIDE 0.03 % NA SOLN: 2.0000 | Freq: Two times a day (BID) | NASAL | 12 refills | Status: DC

## 2021-02-14 MED ORDER — ALBUTEROL SULFATE (2.5 MG/3ML) 0.083% IN NEBU: 2.5000 mg | INHALATION_SOLUTION | Freq: Four times a day (QID) | RESPIRATORY_TRACT | 5 refills | Status: DC | PRN

## 2021-02-14 NOTE — Patient Instructions (Signed)
Will arrange for CT chest  Singulair 10 mg pill nightly  Mucinex 1200 mg twice per day as needed for cough and chest congestion  Will arrange for albuterol that you can use in your nebulizer every 4 to 6 hours as needed for cough, wheezing, chest congestion, or shortness of breath  Atrovent nasal spray every 8 hours as needed for runny nose   Follow up in 3 months

## 2021-02-14 NOTE — Progress Notes (Signed)
[facility]  Chief Complaint  Patient presents with   Follow-up    Feels congestion, wheeze, and cough with clear/yellow mucus, some SOB have improved a little since last OV but are still bothering her.    Past Surgical History:  She  has a past surgical history that includes Colonoscopy (05/2013); colon tumor removed (2010); Colonoscopy (2011); Colonoscopy (10/2003); Esophagogastroduodenoscopy (N/A, 04/29/2016); Savory dilation (N/A, 04/29/2016); Cataract extraction w/PHACO (Left, 11/01/2016); Cataract extraction w/PHACO (Right, 11/29/2016); Breast lumpectomy (Bilateral); Carpal tunnel release (Left, 10/23/2017); and Carpal tunnel release (Right, 11/20/2017).  Past Medical History:  OA, DM type 2, GERD, Headaches, HLD, HTN, Macular degeneration, Neuropathy, Osteopenia, Stress incontinence  Constitutional:  BP 130/82 (BP Location: Left Arm, Patient Position: Sitting)    Pulse 68    Temp 98.4 F (36.9 C) (Temporal)    Ht 5\' 1"  (1.549 m)    Wt 186 lb 1.9 oz (84.4 kg)    SpO2 97% Comment: ra   BMI 35.17 kg/m   Brief Summary:  Michaela Moon is a 85 y.o. female former smoker with a cough.       Subjective:   She is here with her son.    She feels improvement since last visit, but still having symptoms.  She is still having cough with clear to yellow sputum.  She also continues to have runny nose and post nasal drip.  Not having as much wheezing.  Keeping up with activities as best she can.  No fever, or hemoptysis.  Using breztri with spacer twice per day.  Recently got a nebulizer machine and needs medicine for this.  Hasn't been using combivent as much.  She has a wood burning heater, but only uses when weather gets really cold.  She hasn't been using mucinex.  Physical Exam:   Appearance - well kempt   ENMT - no sinus tenderness, no oral exudate, no LAN, Mallampati 3 airway, no stridor  Respiratory - b/l crackles that clear with coughing  CV -  s1s2 regular rate and rhythm, no murmurs  Ext - no clubbing, no edema  Skin - no rashes  Psych - normal mood and affect     Pulmonary testing:  IgE 10/19/20 >> 13  Chest Imaging:    Social History:  She  reports that she quit smoking about 18 years ago. Her smoking use included cigarettes. She has a 40.00 pack-year smoking history. She has never used smokeless tobacco. She reports that she does not drink alcohol and does not use drugs.  Family History:  Her family history includes Melanoma in her brother.     Assessment/Plan:   Chronic mucopurulent bronchitis. - she tried doing PFT in October 2022, but had trouble doing test maneuvers; defer repeating for now - she has some improvement in symptoms since last visit, but still having daily cough with sputum - I am concerned she could have some degree of bronchiectasis contributing to her symptoms that wasn't readily apparent on her chest xray from 10/25/20; will arrange for CT chest without contrast to assess this further - continue breztri with spacer device - add singulair 10 mg nightly - prn combivent and nebulized albuterol; reviewed indications for when she should use these - advised her to try using mucinex to help with expectoration   Chronic rhinitis with post nasal drip. - prn flonase, atrovent nasal sprays - singulair should help some with this also  Time Spent Involved in Patient Care on Day of  Examination:  33 minutes  Follow up:   Patient Instructions  Will arrange for CT chest  Singulair 10 mg pill nightly  Mucinex 1200 mg twice per day as needed for cough and chest congestion  Will arrange for albuterol that you can use in your nebulizer every 4 to 6 hours as needed for cough, wheezing, chest congestion, or shortness of breath  Atrovent nasal spray every 8 hours as needed for runny nose   Follow up in 3 months  Medication List:   Allergies as of 02/14/2021       Reactions   Onion    wheezing         Medication List        Accurate as of February 14, 2021 10:44 AM. If you have any questions, ask your nurse or doctor.          STOP taking these medications    amLODipine 5 MG tablet Commonly known as: NORVASC Stopped by: Chesley Mires, MD   amoxicillin-clavulanate 875-125 MG tablet Commonly known as: AUGMENTIN Stopped by: Chesley Mires, MD       TAKE these medications    acetaminophen-codeine 300-30 MG tablet Commonly known as: TYLENOL #3 Take 1 tablet by mouth every 6 (six) hours as needed for moderate pain.   albuterol (2.5 MG/3ML) 0.083% nebulizer solution Commonly known as: PROVENTIL Take 3 mLs (2.5 mg total) by nebulization every 6 (six) hours as needed for wheezing or shortness of breath. Started by: Chesley Mires, MD   albuterol-ipratropium [phone] MCG/ACT inhaler Commonly known as: COMBIVENT Inhale 2 puffs into the lungs every 6 (six) hours as needed for wheezing.   ARTIFICIAL TEARS OP Place 1 drop into both eyes 4 (four) times daily.   aspirin 81 MG tablet Take 81 mg by mouth daily.   atorvastatin 80 MG tablet Commonly known as: LIPITOR ALTERNATE TAKING 1 TABLET EVERY OTHER DAY WITH 1/2 TABLET EVERY OTHERDAY   Breztri Aerosphere 160-9-4.8 MCG/ACT Aero Generic drug: Budeson-Glycopyrrol-Formoterol Inhale 2 puffs into the lungs in the morning and at bedtime.   Fish Oil 1000 MG Caps Take 1,000 mg by mouth daily.   fluticasone 50 MCG/ACT nasal spray Commonly known as: FLONASE Place 1 spray into both nostrils daily.   gabapentin 400 MG capsule Commonly known as: NEURONTIN Take 400 mg by mouth 3 (three) times daily.   glimepiride 2 MG tablet Commonly known as: AMARYL Take 4 mg by mouth daily with breakfast.   guaiFENesin 600 MG 12 hr tablet Commonly known as: Mucinex Take 2 tablets (1,200 mg total) by mouth 2 (two) times daily as needed for cough or to loosen phlegm. Started by: Chesley Mires, MD   ipratropium 0.03 % nasal spray Commonly  known as: ATROVENT Place 2 sprays into both nostrils every 12 (twelve) hours. What changed:  when to take this reasons to take this Changed by: Chesley Mires, MD   losartan-hydrochlorothiazide 100-12.5 MG tablet Commonly known as: HYZAAR TAKE ONE (1) TABLET EACH DAY   meloxicam 15 MG tablet Commonly known as: MOBIC Take 15 mg by mouth daily.   metFORMIN 1000 MG tablet Commonly known as: GLUCOPHAGE TAKE ONE TABLET TWICE A DAY WITH FOOD   metoprolol tartrate 50 MG tablet Commonly known as: LOPRESSOR Take 50 mg by mouth 2 (two) times daily.   montelukast 10 MG tablet Commonly known as: SINGULAIR Take 1 tablet (10 mg total) by mouth at bedtime. Started by: Chesley Mires, MD   oxybutynin 5 MG tablet Commonly known as:  DITROPAN Take 5 mg by mouth every 8 (eight) hours as needed for bladder spasms.   ranitidine 150 MG tablet Commonly known as: ZANTAC Take 150 mg by mouth 2 (two) times daily.   sitaGLIPtin 100 MG tablet Commonly known as: JANUVIA Take 1 tablet (100 mg total) by mouth daily.   triamcinolone cream 0.1 % Commonly known as: KENALOG Apply 1 application topically daily as needed (eczema).        Signature:  Chesley Mires, MD [place] Pager - [phone] 02/14/2021, 10:44 AM

## 2021-02-20 DIAGNOSIS — H35033 Hypertensive retinopathy, bilateral: Secondary | ICD-10-CM | POA: Diagnosis not present

## 2021-02-20 DIAGNOSIS — H353112 Nonexudative age-related macular degeneration, right eye, intermediate dry stage: Secondary | ICD-10-CM | POA: Diagnosis not present

## 2021-02-20 DIAGNOSIS — H353221 Exudative age-related macular degeneration, left eye, with active choroidal neovascularization: Secondary | ICD-10-CM | POA: Diagnosis not present

## 2021-02-27 ENCOUNTER — Telehealth: Payer: Self-pay | Admitting: Pulmonary Disease

## 2021-02-28 DIAGNOSIS — E782 Mixed hyperlipidemia: Secondary | ICD-10-CM | POA: Diagnosis not present

## 2021-02-28 DIAGNOSIS — I10 Essential (primary) hypertension: Secondary | ICD-10-CM | POA: Diagnosis not present

## 2021-02-28 DIAGNOSIS — I700 Atherosclerosis of aorta: Secondary | ICD-10-CM | POA: Diagnosis not present

## 2021-02-28 DIAGNOSIS — E1143 Type 2 diabetes mellitus with diabetic autonomic (poly)neuropathy: Secondary | ICD-10-CM | POA: Diagnosis not present

## 2021-02-28 MED ORDER — BREZTRI AEROSPHERE 160-9-4.8 MCG/ACT IN AERO: 2.0000 | INHALATION_SPRAY | Freq: Two times a day (BID) | RESPIRATORY_TRACT | 10 refills | Status: DC

## 2021-02-28 NOTE — Telephone Encounter (Signed)
I will get the prescription and then will get it faxed to [facility] patient assistance. Nothing further needed.

## 2021-03-06 ENCOUNTER — Other Ambulatory Visit: Payer: Self-pay

## 2021-03-06 ENCOUNTER — Telehealth: Payer: Self-pay

## 2021-03-06 MED ORDER — BREZTRI AEROSPHERE 160-9-4.8 MCG/ACT IN AERO: 2.0000 | INHALATION_SPRAY | Freq: Two times a day (BID) | RESPIRATORY_TRACT | 11 refills | Status: DC

## 2021-03-06 NOTE — Telephone Encounter (Signed)
[place] Pall, please advise if this was taken care of.

## 2021-03-06 NOTE — Telephone Encounter (Signed)
Received notification from [employer] regarding patients enrollment form. Needs another signed prescription sent. New paper prescription printed and will have Dr. Halford Chessman sign on Friday when he is in [facility] office.

## 2021-03-08 ENCOUNTER — Other Ambulatory Visit: Payer: Self-pay

## 2021-03-08 MED ORDER — BREZTRI AEROSPHERE 160-9-4.8 MCG/ACT IN AERO: 2.0000 | INHALATION_SPRAY | Freq: Two times a day (BID) | RESPIRATORY_TRACT | 11 refills | Status: AC

## 2021-03-09 NOTE — Telephone Encounter (Signed)
Spoke with patient son to let him know that [facility] did RX for [employer] and that Dr. Halford Chessman signed it this morning and it has been faxed to [facility]. He expressed understanding. Nothing further needed at this time.

## 2021-03-09 NOTE — Telephone Encounter (Signed)
Called Michaela Moon and there was no answer- [place] and was advised that the rx for breztri was filled on 02/21/21  Will await call back from [place] to see what they are needing

## 2021-03-09 NOTE — Telephone Encounter (Signed)
Spoke with patient son to let him know that [facility] did RX for [employer] and that Dr. Halford Chessman signed it this morning and it has been faxed to [facility]. He expressed understanding. Nothing further needed at this time.

## 2021-03-09 NOTE — Telephone Encounter (Signed)
Spoke with patient son to let him know that [facility] did RX for [employer] and that Dr. Halford Chessman signed it this morning and it has been faxed to [facility]. He expressed understanding. Nothing further needed at this time.

## 2021-03-16 ENCOUNTER — Ambulatory Visit (HOSPITAL_COMMUNITY)
Admission: RE | Admit: 2021-03-16 | Discharge: 2021-03-16 | Disposition: A | Discharge status: Home / Self Care | Payer: Medicare Other | Source: Ambulatory Visit | Attending: Pulmonary Disease | Admitting: Pulmonary Disease

## 2021-03-16 ENCOUNTER — Encounter (HOSPITAL_COMMUNITY): Payer: Self-pay

## 2021-03-16 ENCOUNTER — Other Ambulatory Visit: Payer: Self-pay

## 2021-03-16 DIAGNOSIS — J9811 Atelectasis: Secondary | ICD-10-CM | POA: Diagnosis not present

## 2021-03-16 DIAGNOSIS — R911 Solitary pulmonary nodule: Secondary | ICD-10-CM | POA: Diagnosis not present

## 2021-03-16 DIAGNOSIS — R918 Other nonspecific abnormal finding of lung field: Secondary | ICD-10-CM | POA: Diagnosis not present

## 2021-03-16 DIAGNOSIS — I700 Atherosclerosis of aorta: Secondary | ICD-10-CM | POA: Diagnosis not present

## 2021-03-16 DIAGNOSIS — J479 Bronchiectasis, uncomplicated: Secondary | ICD-10-CM | POA: Diagnosis not present

## 2021-03-16 DIAGNOSIS — J439 Emphysema, unspecified: Secondary | ICD-10-CM | POA: Diagnosis not present

## 2021-03-19 ENCOUNTER — Other Ambulatory Visit: Payer: Self-pay

## 2021-03-19 ENCOUNTER — Telehealth: Payer: Self-pay | Admitting: Pulmonary Disease

## 2021-03-19 NOTE — Telephone Encounter (Signed)
CT chest 03/16/21 >> atherosclerosis, infiltrate in LUL with air bronchograms, calcified granuloma LUL, 10 mm nodule RUL, mild emphysema, Lt hepatic cyst   Attempted to contact patient at listed number.  Will route to my nurse to follow up with patient.  Please let her know she has an area in her left lung concerning for an infection.  Please send script for augmentin 875 bid for 7 days, and zithromax 500 mg for 5 days.  She needs to have ROV with me or NP scheduled for later this week or next week - can be video visit.

## 2021-03-19 NOTE — Telephone Encounter (Signed)
ATC patient.  LMTCB. 

## 2021-03-19 NOTE — Telephone Encounter (Signed)
This is a call report to provider as an [place].

## 2021-03-19 NOTE — Progress Notes (Signed)
Opened in error

## 2021-03-20 NOTE — Telephone Encounter (Signed)
ATC patient.  LMTCB. 

## 2021-03-21 MED ORDER — AZITHROMYCIN 500 MG PO TABS: 500.0000 mg | ORAL_TABLET | Freq: Every day | ORAL | 0 refills | Status: AC

## 2021-03-21 MED ORDER — AMOXICILLIN-POT CLAVULANATE 875-125 MG PO TABS: 1.0000 | ORAL_TABLET | Freq: Two times a day (BID) | ORAL | 0 refills | Status: AC

## 2021-03-21 NOTE — Addendum Note (Signed)
Addended by: Fritzi Mandes D on: 03/21/2021 09:25 AM   Modules accepted: Orders

## 2021-03-21 NOTE — Telephone Encounter (Signed)
Had an opening for tomorrow 03/22/2021 come up in [place] office. Patient accepted appt for 10:45.

## 2021-03-21 NOTE — Telephone Encounter (Signed)
Called and spoke to [place] (ok per dpr) and patient. Both voiced understanding. Prescriptions sent to [facility].   Offered appt in [place] with an NP and a mychart video visit and patient states that they cannot go to [place] and they do not have smartphones. No availability in [facility] office.  Dr. Halford Chessman please advise

## 2021-03-21 NOTE — Telephone Encounter (Signed)
Can she get double booked for a visit with me in [place] on 1/26 or 1/27?

## 2021-03-22 ENCOUNTER — Encounter: Payer: Self-pay | Admitting: Pulmonary Disease

## 2021-03-22 ENCOUNTER — Ambulatory Visit: Payer: Medicare Other | Admitting: Pulmonary Disease

## 2021-03-22 ENCOUNTER — Other Ambulatory Visit: Payer: Self-pay

## 2021-03-22 VITALS — BP 140/70 | HR 65 | Temp 98.4°F | Ht 65.0 in | Wt 190.0 lb

## 2021-03-22 DIAGNOSIS — J479 Bronchiectasis, uncomplicated: Secondary | ICD-10-CM | POA: Diagnosis not present

## 2021-03-22 DIAGNOSIS — J411 Mucopurulent chronic bronchitis: Secondary | ICD-10-CM | POA: Diagnosis not present

## 2021-03-22 DIAGNOSIS — J189 Pneumonia, unspecified organism: Secondary | ICD-10-CM | POA: Diagnosis not present

## 2021-03-22 DIAGNOSIS — J310 Chronic rhinitis: Secondary | ICD-10-CM

## 2021-03-22 NOTE — Progress Notes (Signed)
[facility]  Chief Complaint  Patient presents with   Follow-up    Patient may be having some symptoms of thrush from [facility] and has been off of it for about a week.    Past Surgical History:  She  has a past surgical history that includes Colonoscopy (05/2013); colon tumor removed (2010); Colonoscopy (2011); Colonoscopy (10/2003); Esophagogastroduodenoscopy (N/A, 04/29/2016); Savory dilation (N/A, 04/29/2016); Cataract extraction w/PHACO (Left, 11/01/2016); Cataract extraction w/PHACO (Right, 11/29/2016); Breast lumpectomy (Bilateral); Carpal tunnel release (Left, 10/23/2017); and Carpal tunnel release (Right, 11/20/2017).  Past Medical History:  OA, DM type 2, GERD, Headaches, HLD, HTN, Macular degeneration, Neuropathy, Osteopenia, Stress incontinence  Constitutional:  BP 140/70 (BP Location: Left Arm, Patient Position: Sitting, Cuff Size: Normal)    Pulse 65    Temp 98.4 F (36.9 C) (Oral)    Ht 5\' 5"  (1.651 m)    Wt 190 lb (86.2 kg)    SpO2 92%    BMI 31.62 kg/m   Brief Summary:  Michaela Moon is a 86 y.o. female former smoker with a cough.       Subjective:   She is here with her son.    She was having more cough, sputum, and chest congestion.  She had CT chest on 03/16/21 that showed Lt upper lobe infiltrate concerning for pneumonia.  Also had nodule in Rt upper lung.  She gets chest and back pain, but usually on Rt side after coughing spells.  Had problem with "thrush" for a day or two.  She stopped using breztri and this got better.  She was more short of breath and more wheezy when she stop breztri.  No fever.  Physical Exam:   Appearance - well kempt   ENMT - no sinus tenderness, no oral exudate, no LAN, Mallampati 2 airway, no stridor  Respiratory - faint crackles in Lt upper/mid lung fields that clear with coughing, no wheeze  CV - s1s2 regular rate and rhythm, no murmurs  Ext - no clubbing, no edema  Skin - no  rashes  Psych - normal mood and affect      Pulmonary testing:  IgE 10/19/20 >> 13  Chest Imaging:  CT chest 03/16/21 >> LUL infiltrate, 8 mm nodule RUL  Social History:  She  reports that she quit smoking about 19 years ago. Her smoking use included cigarettes. She has a 40.00 pack-year smoking history. She has never used smokeless tobacco. She reports that she does not drink alcohol and does not use drugs.  Family History:  Her family history includes Melanoma in her brother.     Assessment/Plan:   Lt upper lung community acquired pneumonia. - complete course of augmentin and zithromax - she will need follow up chest xray in 4 weeks at return office visit  Chronic mucopurulent bronchitis. - she tried doing PFT in October 2022, but had trouble doing test maneuvers; defer repeating for now - resume breztri two puffs bid with spacer device - continue singulair - prn albuterol - prn mucinex  8 mm rt lung nodule with history of smoking. - she will need follow up CT chest without contrast in July 2023  Chronic rhinitis with post nasal drip. - prn flonase, atrovent nasal sprays - singulair should help some with this also  Time Spent Involved in Patient Care on Day of Examination:  38 minutes  Follow up:   Patient Instructions  Follow up in 4 to 5 weeks  Medication List:  Allergies as of 03/22/2021       Reactions   Onion    wheezing        Medication List        Accurate as of March 22, 2021 10:54 AM. If you have any questions, ask your nurse or doctor.          acetaminophen-codeine 300-30 MG tablet Commonly known as: TYLENOL #3 Take 1 tablet by mouth every 6 (six) hours as needed for moderate pain.   albuterol (2.5 MG/3ML) 0.083% nebulizer solution Commonly known as: PROVENTIL Take 3 mLs (2.5 mg total) by nebulization every 6 (six) hours as needed for wheezing or shortness of breath.   albuterol-ipratropium 18-103 MCG/ACT inhaler Commonly  known as: COMBIVENT Inhale 2 puffs into the lungs every 6 (six) hours as needed for wheezing.   amoxicillin-clavulanate 875-125 MG tablet Commonly known as: AUGMENTIN Take 1 tablet by mouth 2 (two) times daily for 7 days.   ARTIFICIAL TEARS OP Place 1 drop into both eyes 4 (four) times daily.   aspirin 81 MG tablet Take 81 mg by mouth daily.   atorvastatin 80 MG tablet Commonly known as: LIPITOR ALTERNATE TAKING 1 TABLET EVERY OTHER DAY WITH 1/2 TABLET EVERY OTHERDAY   azithromycin 500 MG tablet Commonly known as: Zithromax Take 1 tablet (500 mg total) by mouth daily for 5 days.   Breztri Aerosphere 160-9-4.8 MCG/ACT Aero Generic drug: Budeson-Glycopyrrol-Formoterol Inhale 2 puffs into the lungs in the morning and at bedtime.   Fish Oil 1000 MG Caps Take 1,000 mg by mouth daily.   fluticasone 50 MCG/ACT nasal spray Commonly known as: FLONASE Place 1 spray into both nostrils daily.   gabapentin 400 MG capsule Commonly known as: NEURONTIN Take 400 mg by mouth 3 (three) times daily.   glimepiride 2 MG tablet Commonly known as: AMARYL Take 4 mg by mouth daily with breakfast.   guaiFENesin 600 MG 12 hr tablet Commonly known as: Mucinex Take 2 tablets (1,200 mg total) by mouth 2 (two) times daily as needed for cough or to loosen phlegm.   ipratropium 0.03 % nasal spray Commonly known as: ATROVENT Place 2 sprays into both nostrils every 12 (twelve) hours.   losartan-hydrochlorothiazide 100-12.5 MG tablet Commonly known as: HYZAAR TAKE ONE (1) TABLET EACH DAY   meloxicam 15 MG tablet Commonly known as: MOBIC Take 15 mg by mouth daily.   metFORMIN 1000 MG tablet Commonly known as: GLUCOPHAGE TAKE ONE TABLET TWICE A DAY WITH FOOD   metoprolol tartrate 50 MG tablet Commonly known as: LOPRESSOR Take 50 mg by mouth 2 (two) times daily.   montelukast 10 MG tablet Commonly known as: SINGULAIR Take 1 tablet (10 mg total) by mouth at bedtime.   oxybutynin 5 MG  tablet Commonly known as: DITROPAN Take 5 mg by mouth every 8 (eight) hours as needed for bladder spasms.   ranitidine 150 MG tablet Commonly known as: ZANTAC Take 150 mg by mouth 2 (two) times daily.   sitaGLIPtin 100 MG tablet Commonly known as: JANUVIA Take 1 tablet (100 mg total) by mouth daily.   triamcinolone cream 0.1 % Commonly known as: KENALOG Apply 1 application topically daily as needed (eczema).        Signature:  Chesley Mires, MD [place] Pager - [phone] 03/22/2021, 10:54 AM

## 2021-03-22 NOTE — Patient Instructions (Signed)
Follow up in 4 to 5 weeks

## 2021-04-24 ENCOUNTER — Other Ambulatory Visit: Payer: Self-pay

## 2021-04-24 ENCOUNTER — Encounter: Payer: Self-pay | Admitting: Pulmonary Disease

## 2021-04-24 ENCOUNTER — Ambulatory Visit (HOSPITAL_COMMUNITY)
Admission: RE | Admit: 2021-04-24 | Discharge: 2021-04-24 | Disposition: A | Discharge status: Home / Self Care | Payer: Medicare Other | Source: Ambulatory Visit | Attending: Pulmonary Disease | Admitting: Pulmonary Disease

## 2021-04-24 ENCOUNTER — Ambulatory Visit: Payer: Medicare Other | Admitting: Pulmonary Disease

## 2021-04-24 VITALS — BP 134/70 | HR 74 | Temp 98.4°F | Ht 64.0 in | Wt 190.8 lb

## 2021-04-24 DIAGNOSIS — I517 Cardiomegaly: Secondary | ICD-10-CM | POA: Diagnosis not present

## 2021-04-24 DIAGNOSIS — Z8701 Personal history of pneumonia (recurrent): Secondary | ICD-10-CM

## 2021-04-24 DIAGNOSIS — M47814 Spondylosis without myelopathy or radiculopathy, thoracic region: Secondary | ICD-10-CM | POA: Diagnosis not present

## 2021-04-24 DIAGNOSIS — J310 Chronic rhinitis: Secondary | ICD-10-CM

## 2021-04-24 DIAGNOSIS — R911 Solitary pulmonary nodule: Secondary | ICD-10-CM

## 2021-04-24 DIAGNOSIS — J479 Bronchiectasis, uncomplicated: Secondary | ICD-10-CM

## 2021-04-24 DIAGNOSIS — J411 Mucopurulent chronic bronchitis: Secondary | ICD-10-CM

## 2021-04-24 DIAGNOSIS — R918 Other nonspecific abnormal finding of lung field: Secondary | ICD-10-CM | POA: Diagnosis not present

## 2021-04-24 MED ORDER — AZELASTINE HCL 0.15 % NA SOLN: 1.0000 | Freq: Every day | NASAL | 3 refills | Status: DC

## 2021-04-24 NOTE — Patient Instructions (Signed)
Chest xray today  Astepro 1 spray in each nostril nightly  Will schedule CT chest for July 2023 and follow up appointment after that

## 2021-04-24 NOTE — Progress Notes (Signed)
[facility]  Chief Complaint  Patient presents with   Follow-up    Still taking mucinex and still having some chest congestion    Past Surgical History:  She  has a past surgical history that includes Colonoscopy (05/2013); colon tumor removed (2010); Colonoscopy (2011); Colonoscopy (10/2003); Esophagogastroduodenoscopy (N/A, 04/29/2016); Savory dilation (N/A, 04/29/2016); Cataract extraction w/PHACO (Left, 11/01/2016); Cataract extraction w/PHACO (Right, 11/29/2016); Breast lumpectomy (Bilateral); Carpal tunnel release (Left, 10/23/2017); and Carpal tunnel release (Right, 11/20/2017).  Past Medical History:  OA, DM type 2, GERD, Headaches, HLD, HTN, Macular degeneration, Neuropathy, Osteopenia, Stress incontinence  Constitutional:  BP 134/70 (BP Location: Left Arm, Patient Position: Sitting)    Pulse 74    Temp 98.4 F (36.9 C) (Temporal)    Ht 5\' 4"  (1.626 m)    Wt 190 lb 12.8 oz (86.5 kg)    SpO2 96% Comment: ra   BMI 32.75 kg/m   Brief Summary:  Michaela Moon is a 86 y.o. female former smoker with a cough.       Subjective:   She is here with her son.    Still has sinus congestion and runny nose.  Gets a cough and rattle in her chest.  Brings up clear sputum.  Not having chest pain, wheeze, fever, or hemoptysis.  Chest rattling does cause some trouble with he sleep.  Physical Exam:   Appearance - well kempt   ENMT - no sinus tenderness, no oral exudate, no LAN, Mallampati 3 airway, no stridor  Respiratory - equal breath sounds bilaterally, no wheezing or rales  CV - s1s2 regular rate and rhythm, no murmurs  Ext - no clubbing, no edema  Skin - no rashes  Psych - normal mood and affect       Pulmonary testing:  IgE 10/19/20 >> 13  Chest Imaging:  CT chest 03/16/21 >> LUL infiltrate, 8 mm nodule RUL  Social History:  She  reports that she quit smoking about 19 years ago. Her smoking use included cigarettes. She has a 40.00  pack-year smoking history. She has never used smokeless tobacco. She reports that she does not drink alcohol and does not use drugs.  Family History:  Her family history includes Melanoma in her brother.     Assessment/Plan:   Lt upper lung community acquired pneumonia. - from January 2023 - clinically improved - repeat chest xray today  Chronic mucopurulent bronchitis. - she tried doing PFT in October 2022, but had trouble doing test maneuvers; defer repeating for now - breztri bid with spacer - continue singulair - prn mucinex and albuterol  8 mm rt lung nodule with history of smoking. - follow up CT chest without contrast in July 2023  Chronic rhinitis with post nasal drip. - add astepro 1 spray qhs - continue flonase, singulair - prn atrovent nasal spray   Time Spent Involved in Patient Care on Day of Examination:  37 minutes  Follow up:   Patient Instructions  Chest xray today  Astepro 1 spray in each nostril nightly  Will schedule CT chest for July 2023 and follow up appointment after that  Medication List:   Allergies as of 04/24/2021       Reactions   Onion    wheezing        Medication List        Accurate as of April 24, 2021 12:23 PM. If you have any questions, ask your nurse or doctor.  STOP taking these medications    meloxicam 15 MG tablet Commonly known as: MOBIC Stopped by: Chesley Mires, MD   metFORMIN 1000 MG tablet Commonly known as: GLUCOPHAGE Stopped by: Chesley Mires, MD       TAKE these medications    acetaminophen-codeine 300-30 MG tablet Commonly known as: TYLENOL #3 Take 1 tablet by mouth every 6 (six) hours as needed for moderate pain.   albuterol (2.5 MG/3ML) 0.083% nebulizer solution Commonly known as: PROVENTIL Take 3 mLs (2.5 mg total) by nebulization every 6 (six) hours as needed for wheezing or shortness of breath.   albuterol-ipratropium 18-103 MCG/ACT inhaler Commonly known as:  COMBIVENT Inhale 2 puffs into the lungs every 6 (six) hours as needed for wheezing.   ARTIFICIAL TEARS OP Place 1 drop into both eyes 4 (four) times daily.   aspirin 81 MG tablet Take 81 mg by mouth daily.   atorvastatin 80 MG tablet Commonly known as: LIPITOR ALTERNATE TAKING 1 TABLET EVERY OTHER DAY WITH 1/2 TABLET EVERY OTHERDAY   Azelastine HCl 0.15 % Soln Commonly known as: Astepro Place 1 spray into the nose at bedtime. Started by: Chesley Mires, MD   Arnell Sieving [phone] MCG/ACT Aero Generic drug: Budeson-Glycopyrrol-Formoterol Inhale 2 puffs into the lungs in the morning and at bedtime.   Fish Oil 1000 MG Caps Take 1,000 mg by mouth daily.   fluticasone 50 MCG/ACT nasal spray Commonly known as: FLONASE Place 1 spray into both nostrils daily.   gabapentin 400 MG capsule Commonly known as: NEURONTIN Take 400 mg by mouth 3 (three) times daily.   glimepiride 2 MG tablet Commonly known as: AMARYL Take 4 mg by mouth daily with breakfast.   guaiFENesin 600 MG 12 hr tablet Commonly known as: Mucinex Take 2 tablets (1,200 mg total) by mouth 2 (two) times daily as needed for cough or to loosen phlegm.   ipratropium 0.03 % nasal spray Commonly known as: ATROVENT Place 2 sprays into both nostrils every 12 (twelve) hours.   Lantus SoloStar 100 UNIT/ML Solostar Pen Generic drug: insulin glargine Inject into the skin.   losartan-hydrochlorothiazide 100-12.5 MG tablet Commonly known as: HYZAAR TAKE ONE (1) TABLET EACH DAY   metoprolol tartrate 50 MG tablet Commonly known as: LOPRESSOR Take 50 mg by mouth 2 (two) times daily.   montelukast 10 MG tablet Commonly known as: SINGULAIR Take 1 tablet (10 mg total) by mouth at bedtime.   oxybutynin 5 MG tablet Commonly known as: DITROPAN Take 5 mg by mouth every 8 (eight) hours as needed for bladder spasms.   ranitidine 150 MG tablet Commonly known as: ZANTAC Take 150 mg by mouth 2 (two) times daily.    sitaGLIPtin 100 MG tablet Commonly known as: JANUVIA Take 1 tablet (100 mg total) by mouth daily.   triamcinolone cream 0.1 % Commonly known as: KENALOG Apply 1 application topically daily as needed (eczema).        Signature:  Chesley Mires, MD [place] Pager - [phone] 04/24/2021, 12:23 PM

## 2021-05-01 DIAGNOSIS — H353231 Exudative age-related macular degeneration, bilateral, with active choroidal neovascularization: Secondary | ICD-10-CM | POA: Diagnosis not present

## 2021-05-01 DIAGNOSIS — H43813 Vitreous degeneration, bilateral: Secondary | ICD-10-CM | POA: Diagnosis not present

## 2021-05-01 DIAGNOSIS — H35371 Puckering of macula, right eye: Secondary | ICD-10-CM | POA: Diagnosis not present

## 2021-05-01 DIAGNOSIS — H35033 Hypertensive retinopathy, bilateral: Secondary | ICD-10-CM | POA: Diagnosis not present

## 2021-05-21 ENCOUNTER — Ambulatory Visit: Payer: Medicare Other | Admitting: Pulmonary Disease

## 2021-05-29 DIAGNOSIS — H353231 Exudative age-related macular degeneration, bilateral, with active choroidal neovascularization: Secondary | ICD-10-CM | POA: Diagnosis not present

## 2021-05-29 DIAGNOSIS — H353211 Exudative age-related macular degeneration, right eye, with active choroidal neovascularization: Secondary | ICD-10-CM | POA: Diagnosis not present

## 2021-05-31 DIAGNOSIS — I10 Essential (primary) hypertension: Secondary | ICD-10-CM | POA: Diagnosis not present

## 2021-05-31 DIAGNOSIS — E782 Mixed hyperlipidemia: Secondary | ICD-10-CM | POA: Diagnosis not present

## 2021-05-31 DIAGNOSIS — I700 Atherosclerosis of aorta: Secondary | ICD-10-CM | POA: Diagnosis not present

## 2021-05-31 DIAGNOSIS — Z0000 Encounter for general adult medical examination without abnormal findings: Secondary | ICD-10-CM | POA: Diagnosis not present

## 2021-05-31 DIAGNOSIS — E1143 Type 2 diabetes mellitus with diabetic autonomic (poly)neuropathy: Secondary | ICD-10-CM | POA: Diagnosis not present

## 2021-06-04 DIAGNOSIS — M5431 Sciatica, right side: Secondary | ICD-10-CM | POA: Diagnosis not present

## 2021-06-11 DIAGNOSIS — M79604 Pain in right leg: Secondary | ICD-10-CM | POA: Diagnosis not present

## 2021-06-11 DIAGNOSIS — M79661 Pain in right lower leg: Secondary | ICD-10-CM | POA: Diagnosis not present

## 2021-06-11 DIAGNOSIS — M7989 Other specified soft tissue disorders: Secondary | ICD-10-CM | POA: Diagnosis not present

## 2021-07-10 DIAGNOSIS — H353231 Exudative age-related macular degeneration, bilateral, with active choroidal neovascularization: Secondary | ICD-10-CM | POA: Diagnosis not present

## 2021-07-10 DIAGNOSIS — H353211 Exudative age-related macular degeneration, right eye, with active choroidal neovascularization: Secondary | ICD-10-CM | POA: Diagnosis not present

## 2021-07-30 ENCOUNTER — Other Ambulatory Visit: Payer: Self-pay | Admitting: Pulmonary Disease

## 2021-08-21 DIAGNOSIS — H35033 Hypertensive retinopathy, bilateral: Secondary | ICD-10-CM | POA: Diagnosis not present

## 2021-08-21 DIAGNOSIS — H43813 Vitreous degeneration, bilateral: Secondary | ICD-10-CM | POA: Diagnosis not present

## 2021-08-21 DIAGNOSIS — H35371 Puckering of macula, right eye: Secondary | ICD-10-CM | POA: Diagnosis not present

## 2021-08-21 DIAGNOSIS — H353231 Exudative age-related macular degeneration, bilateral, with active choroidal neovascularization: Secondary | ICD-10-CM | POA: Diagnosis not present

## 2021-08-31 ENCOUNTER — Ambulatory Visit (HOSPITAL_COMMUNITY): Admission: RE | Admit: 2021-08-31 | Payer: Medicare Other | Source: Ambulatory Visit

## 2021-09-07 ENCOUNTER — Ambulatory Visit: Payer: Medicare Other | Admitting: Pulmonary Disease

## 2021-09-13 DIAGNOSIS — I10 Essential (primary) hypertension: Secondary | ICD-10-CM | POA: Diagnosis not present

## 2021-09-13 DIAGNOSIS — Z0000 Encounter for general adult medical examination without abnormal findings: Secondary | ICD-10-CM | POA: Diagnosis not present

## 2021-09-13 DIAGNOSIS — J449 Chronic obstructive pulmonary disease, unspecified: Secondary | ICD-10-CM | POA: Diagnosis not present

## 2021-09-13 DIAGNOSIS — E1143 Type 2 diabetes mellitus with diabetic autonomic (poly)neuropathy: Secondary | ICD-10-CM | POA: Diagnosis not present

## 2021-09-13 DIAGNOSIS — I700 Atherosclerosis of aorta: Secondary | ICD-10-CM | POA: Diagnosis not present

## 2021-09-20 ENCOUNTER — Ambulatory Visit (HOSPITAL_COMMUNITY)
Admission: RE | Admit: 2021-09-20 | Discharge: 2021-09-20 | Disposition: A | Discharge status: Home / Self Care | Payer: Medicare Other | Source: Ambulatory Visit | Attending: Pulmonary Disease | Admitting: Pulmonary Disease

## 2021-09-20 DIAGNOSIS — R911 Solitary pulmonary nodule: Secondary | ICD-10-CM

## 2021-10-01 ENCOUNTER — Telehealth: Payer: Self-pay | Admitting: Pulmonary Disease

## 2021-10-01 ENCOUNTER — Ambulatory Visit: Payer: Medicare Other | Admitting: Pulmonary Disease

## 2021-10-01 ENCOUNTER — Encounter: Payer: Self-pay | Admitting: Pulmonary Disease

## 2021-10-01 VITALS — BP 144/72 | HR 84 | Temp 98.4°F | Ht 64.0 in | Wt 190.4 lb

## 2021-10-01 DIAGNOSIS — J189 Pneumonia, unspecified organism: Secondary | ICD-10-CM

## 2021-10-01 DIAGNOSIS — J479 Bronchiectasis, uncomplicated: Secondary | ICD-10-CM

## 2021-10-01 DIAGNOSIS — J411 Mucopurulent chronic bronchitis: Secondary | ICD-10-CM | POA: Diagnosis not present

## 2021-10-01 MED ORDER — PREDNISONE 10 MG PO TABS: ORAL_TABLET | ORAL | 0 refills | Status: AC

## 2021-10-01 MED ORDER — IPRATROPIUM-ALBUTEROL 20-100 MCG/ACT IN AERS: 1.0000 | INHALATION_SPRAY | Freq: Four times a day (QID) | RESPIRATORY_TRACT | 3 refills | Status: DC

## 2021-10-01 NOTE — Progress Notes (Signed)
[facility]  Chief Complaint  Patient presents with   Follow-up    CT results.   Has questions about rattling and sputum.    Past Surgical History:  She  has a past surgical history that includes Colonoscopy (05/2013); colon tumor removed (2010); Colonoscopy (2011); Colonoscopy (10/2003); Esophagogastroduodenoscopy (N/A, 04/29/2016); Savory dilation (N/A, 04/29/2016); Cataract extraction w/PHACO (Left, 11/01/2016); Cataract extraction w/PHACO (Right, 11/29/2016); Breast lumpectomy (Bilateral); Carpal tunnel release (Left, 10/23/2017); and Carpal tunnel release (Right, 11/20/2017).  Past Medical History:  OA, DM type 2, GERD, Headaches, HLD, HTN, Macular degeneration, Neuropathy, Osteopenia, Stress incontinence  Constitutional:  BP (!) 144/72 (BP Location: Left Arm, Patient Position: Sitting)   Pulse 84   Temp 98.4 F (36.9 C) (Temporal)   Ht '5\' 4"'$  (1.626 m)   Wt 190 lb 6.4 oz (86.4 kg)   SpO2 97% Comment: ra  BMI 32.68 kg/m   Brief Summary:  Michaela Moon is a 86 y.o. female former smoker with a cough.       Subjective:   She is here with her son.    She had recurrent episode of pneumonia at the beginning of July.  She saw her PCP and was treated with a course of antibiotics.  This helped.  She still has cough with sputum, but phlegm is now clear.  She is not having fever, sweats, hemoptysis, or chest pain.  She is not very active.  Her son tries to get her to use her incentive spirometer.  She didn't get prednisone when she had her most recent flare up.  CT chest from last week showed increased infiltrates and roughly stable lung nodule.  Physical Exam:   Appearance - well kempt   ENMT - no sinus tenderness, no oral exudate, no LAN, Mallampati 3 airway, no stridor  Respiratory - bilateral crackles that partially clear with coughing  CV - s1s2 regular rate and rhythm, no murmurs  Ext - no clubbing, no edema  Skin - no  rashes  Psych - normal mood and affect        Pulmonary testing:  IgE 10/19/20 >> 13  Chest Imaging:  CT chest 03/16/21 >> LUL infiltrate, 8 mm nodule RUL CT chest 09/20/21 >> 8 mm nodule RUL, increased tree in bud in RUL, increased ASD RML, new infiltrate LLL and LUL  Social History:  She  reports that she quit smoking about 19 years ago. Her smoking use included cigarettes. She has a 40.00 pack-year smoking history. She has never used smokeless tobacco. She reports that she does not drink alcohol and does not use drugs.  Family History:  Her family history includes Melanoma in her brother.     Assessment/Plan:   Recurrent pneumonia. - will give her course of prednisone in case this is more of an inflammatory reaction - will arrange for sputum gram stain and culture and sputum for AFB  Chronic mucopurulent bronchitis. - she tried doing PFT in October 2022, but had trouble doing test maneuvers; defer repeating for now - continue breztri and singulair - prn albuterol, mucinex - might need to set up chest vest if sputum production persists - continue bronchial hygiene  8 mm rt lung nodule with history of smoking. - follow up CT chest without contrast in July 2023  Chronic rhinitis with post nasal drip. - continue astepro, flonase, singulair - prn atrovent nasal spray   Time Spent Involved in Patient Care on Day of Examination:  38 minutes  Follow up:   Patient Instructions  Prednisone 10 mg pill >> 3 pills daily for 2 days, 2 pills daily for 2 days, 1 pill daily for 2 days  Will arrange for sputum cultures  Follow up in 4 months  Medication List:   Allergies as of 10/01/2021       Reactions   Onion    wheezing        Medication List        Accurate as of October 01, 2021 12:27 PM. If you have any questions, ask your nurse or doctor.          acetaminophen-codeine 300-30 MG tablet Commonly known as: TYLENOL #3 Take 1 tablet by mouth every 6 (six)  hours as needed for moderate pain.   albuterol (2.5 MG/3ML) 0.083% nebulizer solution Commonly known as: PROVENTIL Take 3 mLs (2.5 mg total) by nebulization every 6 (six) hours as needed for wheezing or shortness of breath.   albuterol-ipratropium 18-103 MCG/ACT inhaler Commonly known as: COMBIVENT Inhale 2 puffs into the lungs every 6 (six) hours as needed for wheezing.   ARTIFICIAL TEARS OP Place 1 drop into both eyes 4 (four) times daily.   aspirin 81 MG tablet Take 81 mg by mouth daily.   atorvastatin 80 MG tablet Commonly known as: LIPITOR ALTERNATE TAKING 1 TABLET EVERY OTHER DAY WITH 1/2 TABLET EVERY OTHERDAY   Azelastine HCl 0.15 % Soln Commonly known as: Astepro Place 1 spray into the nose at bedtime.   Breztri Aerosphere 160-9-4.8 MCG/ACT Aero Generic drug: Budeson-Glycopyrrol-Formoterol Inhale 2 puffs into the lungs in the morning and at bedtime.   Fish Oil 1000 MG Caps Take 1,000 mg by mouth daily.   fluticasone 50 MCG/ACT nasal spray Commonly known as: FLONASE Place 1 spray into both nostrils daily.   gabapentin 400 MG capsule Commonly known as: NEURONTIN Take 400 mg by mouth 3 (three) times daily.   glimepiride 2 MG tablet Commonly known as: AMARYL Take 4 mg by mouth daily with breakfast.   guaiFENesin 600 MG 12 hr tablet Commonly known as: Mucinex Take 2 tablets (1,200 mg total) by mouth 2 (two) times daily as needed for cough or to loosen phlegm.   ipratropium 0.03 % nasal spray Commonly known as: ATROVENT Place 2 sprays into both nostrils every 12 (twelve) hours.   Lantus SoloStar 100 UNIT/ML Solostar Pen Generic drug: insulin glargine Inject into the skin.   losartan-hydrochlorothiazide 100-12.5 MG tablet Commonly known as: HYZAAR TAKE ONE (1) TABLET EACH DAY   metoprolol tartrate 50 MG tablet Commonly known as: LOPRESSOR Take 50 mg by mouth 2 (two) times daily.   montelukast 10 MG tablet Commonly known as: SINGULAIR TAKE ONE TABLET  BY MOUTH AT BEDTIME   oxybutynin 5 MG tablet Commonly known as: DITROPAN Take 5 mg by mouth every 8 (eight) hours as needed for bladder spasms.   predniSONE 10 MG tablet Commonly known as: DELTASONE Take 3 tablets (30 mg total) by mouth daily with breakfast for 2 days, THEN 2 tablets (20 mg total) daily with breakfast for 2 days, THEN 1 tablet (10 mg total) daily with breakfast for 2 days. Start taking on: October 01, 2021 Started by: Chesley Mires, MD   ranitidine 150 MG tablet Commonly known as: ZANTAC Take 150 mg by mouth 2 (two) times daily.   sitaGLIPtin 100 MG tablet Commonly known as: JANUVIA Take 1 tablet (100 mg total) by mouth daily.   triamcinolone cream 0.1 % Commonly known as: KENALOG  Apply 1 application topically daily as needed (eczema).        Signature:  Chesley Mires, MD [place] Pager - [phone] 10/01/2021, 12:27 PM

## 2021-10-01 NOTE — Patient Instructions (Signed)
Prednisone 10 mg pill >> 3 pills daily for 2 days, 2 pills daily for 2 days, 1 pill daily for 2 days  Will arrange for sputum cultures  Follow up in 4 months

## 2021-10-01 NOTE — Telephone Encounter (Signed)
Patients son states he discussed inhalers with Dr. Halford Chessman during ov today.   Dr. Halford Chessman both inhalers needed a new order, can you review pending inhalers before we send them to make sure they are what you prefer we order for pt?   Thanks!

## 2021-10-01 NOTE — Telephone Encounter (Signed)
She should use combivent prn for rescue inhaler or albuterol by nebulizer.  She doesn't need albuterol inhaler.

## 2021-10-01 NOTE — Telephone Encounter (Signed)
Called and notified patients son [name] of recommendations per Dr. Halford Chessman. He voiced understanding and nothing further is needed at this time.

## 2021-10-02 DIAGNOSIS — H353231 Exudative age-related macular degeneration, bilateral, with active choroidal neovascularization: Secondary | ICD-10-CM | POA: Diagnosis not present

## 2021-10-02 DIAGNOSIS — H35371 Puckering of macula, right eye: Secondary | ICD-10-CM | POA: Diagnosis not present

## 2021-10-16 ENCOUNTER — Ambulatory Visit: Payer: Medicare Other | Admitting: Pulmonary Disease

## 2021-10-18 DIAGNOSIS — Z1231 Encounter for screening mammogram for malignant neoplasm of breast: Secondary | ICD-10-CM | POA: Diagnosis not present

## 2021-10-27 DIAGNOSIS — R059 Cough, unspecified: Secondary | ICD-10-CM | POA: Diagnosis not present

## 2021-10-27 DIAGNOSIS — N179 Acute kidney failure, unspecified: Secondary | ICD-10-CM | POA: Diagnosis not present

## 2021-10-27 DIAGNOSIS — E875 Hyperkalemia: Secondary | ICD-10-CM | POA: Diagnosis not present

## 2021-10-27 DIAGNOSIS — Z794 Long term (current) use of insulin: Secondary | ICD-10-CM | POA: Diagnosis not present

## 2021-10-27 DIAGNOSIS — D649 Anemia, unspecified: Secondary | ICD-10-CM | POA: Diagnosis not present

## 2021-10-27 DIAGNOSIS — E1165 Type 2 diabetes mellitus with hyperglycemia: Secondary | ICD-10-CM | POA: Diagnosis not present

## 2021-10-27 DIAGNOSIS — E1142 Type 2 diabetes mellitus with diabetic polyneuropathy: Secondary | ICD-10-CM | POA: Diagnosis not present

## 2021-10-27 DIAGNOSIS — Z043 Encounter for examination and observation following other accident: Secondary | ICD-10-CM | POA: Diagnosis not present

## 2021-10-27 DIAGNOSIS — A4189 Other specified sepsis: Secondary | ICD-10-CM | POA: Diagnosis not present

## 2021-10-27 DIAGNOSIS — Z20822 Contact with and (suspected) exposure to covid-19: Secondary | ICD-10-CM | POA: Diagnosis not present

## 2021-10-27 DIAGNOSIS — E162 Hypoglycemia, unspecified: Secondary | ICD-10-CM | POA: Diagnosis not present

## 2021-10-27 DIAGNOSIS — I10 Essential (primary) hypertension: Secondary | ICD-10-CM | POA: Diagnosis not present

## 2021-10-27 DIAGNOSIS — E119 Type 2 diabetes mellitus without complications: Secondary | ICD-10-CM | POA: Diagnosis not present

## 2021-10-27 DIAGNOSIS — E161 Other hypoglycemia: Secondary | ICD-10-CM | POA: Diagnosis not present

## 2021-10-27 DIAGNOSIS — E7800 Pure hypercholesterolemia, unspecified: Secondary | ICD-10-CM | POA: Diagnosis not present

## 2021-10-27 DIAGNOSIS — J1282 Pneumonia due to coronavirus disease 2019: Secondary | ICD-10-CM | POA: Diagnosis not present

## 2021-10-27 DIAGNOSIS — K219 Gastro-esophageal reflux disease without esophagitis: Secondary | ICD-10-CM | POA: Diagnosis not present

## 2021-10-27 DIAGNOSIS — J440 Chronic obstructive pulmonary disease with acute lower respiratory infection: Secondary | ICD-10-CM | POA: Diagnosis not present

## 2021-10-27 DIAGNOSIS — D72829 Elevated white blood cell count, unspecified: Secondary | ICD-10-CM | POA: Diagnosis not present

## 2021-10-27 DIAGNOSIS — R6889 Other general symptoms and signs: Secondary | ICD-10-CM | POA: Diagnosis not present

## 2021-10-27 DIAGNOSIS — Z9989 Dependence on other enabling machines and devices: Secondary | ICD-10-CM | POA: Diagnosis not present

## 2021-10-27 DIAGNOSIS — Z792 Long term (current) use of antibiotics: Secondary | ICD-10-CM | POA: Diagnosis not present

## 2021-10-27 DIAGNOSIS — J449 Chronic obstructive pulmonary disease, unspecified: Secondary | ICD-10-CM | POA: Diagnosis not present

## 2021-10-27 DIAGNOSIS — Z7984 Long term (current) use of oral hypoglycemic drugs: Secondary | ICD-10-CM | POA: Diagnosis not present

## 2021-10-27 DIAGNOSIS — Z79899 Other long term (current) drug therapy: Secondary | ICD-10-CM | POA: Diagnosis not present

## 2021-10-27 DIAGNOSIS — J441 Chronic obstructive pulmonary disease with (acute) exacerbation: Secondary | ICD-10-CM | POA: Diagnosis not present

## 2021-10-27 DIAGNOSIS — S0990XA Unspecified injury of head, initial encounter: Secondary | ICD-10-CM | POA: Diagnosis not present

## 2021-10-27 DIAGNOSIS — J9601 Acute respiratory failure with hypoxia: Secondary | ICD-10-CM | POA: Diagnosis not present

## 2021-10-27 DIAGNOSIS — R918 Other nonspecific abnormal finding of lung field: Secondary | ICD-10-CM | POA: Diagnosis not present

## 2021-10-27 DIAGNOSIS — R7881 Bacteremia: Secondary | ICD-10-CM | POA: Diagnosis not present

## 2021-10-27 DIAGNOSIS — G629 Polyneuropathy, unspecified: Secondary | ICD-10-CM | POA: Diagnosis not present

## 2021-10-27 DIAGNOSIS — A419 Sepsis, unspecified organism: Secondary | ICD-10-CM | POA: Diagnosis not present

## 2021-10-27 DIAGNOSIS — G319 Degenerative disease of nervous system, unspecified: Secondary | ICD-10-CM | POA: Diagnosis not present

## 2021-10-27 DIAGNOSIS — Z9981 Dependence on supplemental oxygen: Secondary | ICD-10-CM | POA: Diagnosis not present

## 2021-10-27 DIAGNOSIS — B957 Other staphylococcus as the cause of diseases classified elsewhere: Secondary | ICD-10-CM | POA: Diagnosis not present

## 2021-10-27 DIAGNOSIS — E11649 Type 2 diabetes mellitus with hypoglycemia without coma: Secondary | ICD-10-CM | POA: Diagnosis not present

## 2021-10-27 DIAGNOSIS — I499 Cardiac arrhythmia, unspecified: Secondary | ICD-10-CM | POA: Diagnosis not present

## 2021-10-27 DIAGNOSIS — Z87891 Personal history of nicotine dependence: Secondary | ICD-10-CM | POA: Diagnosis not present

## 2021-10-27 DIAGNOSIS — Z743 Need for continuous supervision: Secondary | ICD-10-CM | POA: Diagnosis not present

## 2021-10-27 DIAGNOSIS — R251 Tremor, unspecified: Secondary | ICD-10-CM | POA: Diagnosis not present

## 2021-10-27 DIAGNOSIS — R0902 Hypoxemia: Secondary | ICD-10-CM | POA: Diagnosis not present

## 2021-10-27 DIAGNOSIS — R6520 Severe sepsis without septic shock: Secondary | ICD-10-CM | POA: Diagnosis not present

## 2021-10-27 DIAGNOSIS — R0600 Dyspnea, unspecified: Secondary | ICD-10-CM | POA: Diagnosis not present

## 2021-10-27 DIAGNOSIS — Z7951 Long term (current) use of inhaled steroids: Secondary | ICD-10-CM | POA: Diagnosis not present

## 2021-10-27 DIAGNOSIS — E785 Hyperlipidemia, unspecified: Secondary | ICD-10-CM | POA: Diagnosis not present

## 2021-10-27 DIAGNOSIS — E860 Dehydration: Secondary | ICD-10-CM | POA: Diagnosis not present

## 2021-10-27 DIAGNOSIS — U071 COVID-19: Secondary | ICD-10-CM | POA: Diagnosis not present

## 2021-11-07 DIAGNOSIS — J449 Chronic obstructive pulmonary disease, unspecified: Secondary | ICD-10-CM | POA: Diagnosis not present

## 2021-11-08 DIAGNOSIS — U071 COVID-19: Secondary | ICD-10-CM | POA: Diagnosis not present

## 2021-11-08 DIAGNOSIS — D72829 Elevated white blood cell count, unspecified: Secondary | ICD-10-CM | POA: Diagnosis not present

## 2021-11-08 DIAGNOSIS — E119 Type 2 diabetes mellitus without complications: Secondary | ICD-10-CM | POA: Diagnosis not present

## 2021-11-08 DIAGNOSIS — J449 Chronic obstructive pulmonary disease, unspecified: Secondary | ICD-10-CM | POA: Diagnosis not present

## 2021-11-09 DIAGNOSIS — E119 Type 2 diabetes mellitus without complications: Secondary | ICD-10-CM | POA: Diagnosis not present

## 2021-11-09 DIAGNOSIS — U071 COVID-19: Secondary | ICD-10-CM | POA: Diagnosis not present

## 2021-11-09 DIAGNOSIS — D72829 Elevated white blood cell count, unspecified: Secondary | ICD-10-CM | POA: Diagnosis not present

## 2021-11-09 DIAGNOSIS — J449 Chronic obstructive pulmonary disease, unspecified: Secondary | ICD-10-CM | POA: Diagnosis not present

## 2021-11-13 DIAGNOSIS — I700 Atherosclerosis of aorta: Secondary | ICD-10-CM | POA: Diagnosis not present

## 2021-11-13 DIAGNOSIS — E119 Type 2 diabetes mellitus without complications: Secondary | ICD-10-CM | POA: Diagnosis not present

## 2021-11-13 DIAGNOSIS — Z0000 Encounter for general adult medical examination without abnormal findings: Secondary | ICD-10-CM | POA: Diagnosis not present

## 2021-11-13 DIAGNOSIS — U071 COVID-19: Secondary | ICD-10-CM | POA: Diagnosis not present

## 2021-11-13 DIAGNOSIS — E1143 Type 2 diabetes mellitus with diabetic autonomic (poly)neuropathy: Secondary | ICD-10-CM | POA: Diagnosis not present

## 2021-11-13 DIAGNOSIS — D72829 Elevated white blood cell count, unspecified: Secondary | ICD-10-CM | POA: Diagnosis not present

## 2021-11-13 DIAGNOSIS — U099 Post covid-19 condition, unspecified: Secondary | ICD-10-CM | POA: Diagnosis not present

## 2021-11-13 DIAGNOSIS — I10 Essential (primary) hypertension: Secondary | ICD-10-CM | POA: Diagnosis not present

## 2021-11-13 DIAGNOSIS — J449 Chronic obstructive pulmonary disease, unspecified: Secondary | ICD-10-CM | POA: Diagnosis not present

## 2021-11-14 DIAGNOSIS — E119 Type 2 diabetes mellitus without complications: Secondary | ICD-10-CM | POA: Diagnosis not present

## 2021-11-14 DIAGNOSIS — U071 COVID-19: Secondary | ICD-10-CM | POA: Diagnosis not present

## 2021-11-14 DIAGNOSIS — D72829 Elevated white blood cell count, unspecified: Secondary | ICD-10-CM | POA: Diagnosis not present

## 2021-11-14 DIAGNOSIS — J449 Chronic obstructive pulmonary disease, unspecified: Secondary | ICD-10-CM | POA: Diagnosis not present

## 2021-11-15 DIAGNOSIS — D72829 Elevated white blood cell count, unspecified: Secondary | ICD-10-CM | POA: Diagnosis not present

## 2021-11-15 DIAGNOSIS — J449 Chronic obstructive pulmonary disease, unspecified: Secondary | ICD-10-CM | POA: Diagnosis not present

## 2021-11-15 DIAGNOSIS — U071 COVID-19: Secondary | ICD-10-CM | POA: Diagnosis not present

## 2021-11-15 DIAGNOSIS — E119 Type 2 diabetes mellitus without complications: Secondary | ICD-10-CM | POA: Diagnosis not present

## 2021-11-16 DIAGNOSIS — E119 Type 2 diabetes mellitus without complications: Secondary | ICD-10-CM | POA: Diagnosis not present

## 2021-11-16 DIAGNOSIS — J449 Chronic obstructive pulmonary disease, unspecified: Secondary | ICD-10-CM | POA: Diagnosis not present

## 2021-11-16 DIAGNOSIS — D72829 Elevated white blood cell count, unspecified: Secondary | ICD-10-CM | POA: Diagnosis not present

## 2021-11-16 DIAGNOSIS — U071 COVID-19: Secondary | ICD-10-CM | POA: Diagnosis not present

## 2021-11-19 DIAGNOSIS — U071 COVID-19: Secondary | ICD-10-CM | POA: Diagnosis not present

## 2021-11-19 DIAGNOSIS — E119 Type 2 diabetes mellitus without complications: Secondary | ICD-10-CM | POA: Diagnosis not present

## 2021-11-19 DIAGNOSIS — J449 Chronic obstructive pulmonary disease, unspecified: Secondary | ICD-10-CM | POA: Diagnosis not present

## 2021-11-19 DIAGNOSIS — D72829 Elevated white blood cell count, unspecified: Secondary | ICD-10-CM | POA: Diagnosis not present

## 2021-11-20 DIAGNOSIS — J449 Chronic obstructive pulmonary disease, unspecified: Secondary | ICD-10-CM | POA: Diagnosis not present

## 2021-11-20 DIAGNOSIS — U071 COVID-19: Secondary | ICD-10-CM | POA: Diagnosis not present

## 2021-11-20 DIAGNOSIS — E119 Type 2 diabetes mellitus without complications: Secondary | ICD-10-CM | POA: Diagnosis not present

## 2021-11-20 DIAGNOSIS — D72829 Elevated white blood cell count, unspecified: Secondary | ICD-10-CM | POA: Diagnosis not present

## 2021-11-21 DIAGNOSIS — J449 Chronic obstructive pulmonary disease, unspecified: Secondary | ICD-10-CM | POA: Diagnosis not present

## 2021-11-21 DIAGNOSIS — U071 COVID-19: Secondary | ICD-10-CM | POA: Diagnosis not present

## 2021-11-21 DIAGNOSIS — D72829 Elevated white blood cell count, unspecified: Secondary | ICD-10-CM | POA: Diagnosis not present

## 2021-11-21 DIAGNOSIS — E119 Type 2 diabetes mellitus without complications: Secondary | ICD-10-CM | POA: Diagnosis not present

## 2021-11-22 DIAGNOSIS — E119 Type 2 diabetes mellitus without complications: Secondary | ICD-10-CM | POA: Diagnosis not present

## 2021-11-22 DIAGNOSIS — U071 COVID-19: Secondary | ICD-10-CM | POA: Diagnosis not present

## 2021-11-22 DIAGNOSIS — D72829 Elevated white blood cell count, unspecified: Secondary | ICD-10-CM | POA: Diagnosis not present

## 2021-11-22 DIAGNOSIS — J449 Chronic obstructive pulmonary disease, unspecified: Secondary | ICD-10-CM | POA: Diagnosis not present

## 2021-11-26 ENCOUNTER — Ambulatory Visit: Payer: Medicare Other | Admitting: Pulmonary Disease

## 2021-11-26 ENCOUNTER — Encounter: Payer: Self-pay | Admitting: Pulmonary Disease

## 2021-11-26 VITALS — BP 132/78 | HR 94 | Temp 97.9°F | Ht 64.0 in | Wt 180.2 lb

## 2021-11-26 DIAGNOSIS — R911 Solitary pulmonary nodule: Secondary | ICD-10-CM | POA: Diagnosis not present

## 2021-11-26 DIAGNOSIS — J411 Mucopurulent chronic bronchitis: Secondary | ICD-10-CM | POA: Diagnosis not present

## 2021-11-26 DIAGNOSIS — J189 Pneumonia, unspecified organism: Secondary | ICD-10-CM | POA: Diagnosis not present

## 2021-11-26 DIAGNOSIS — Z23 Encounter for immunization: Secondary | ICD-10-CM

## 2021-11-26 DIAGNOSIS — J479 Bronchiectasis, uncomplicated: Secondary | ICD-10-CM | POA: Diagnosis not present

## 2021-11-26 NOTE — Patient Instructions (Signed)
High dose flu shot today  Try using mucinex and your flutter valve twice per day to help with cough and bringing up phlegm  Follow up in 2 months

## 2021-11-26 NOTE — Progress Notes (Signed)
[facility]  Chief Complaint  Patient presents with   Follow-up    Had hosp stay from 9/2-9/15 for pneumonia. Feels she is improving but not back to herself yet    Past Surgical History:  She  has a past surgical history that includes Colonoscopy (05/2013); colon tumor removed (2010); Colonoscopy (2011); Colonoscopy (10/2003); Esophagogastroduodenoscopy (N/A, 04/29/2016); Savory dilation (N/A, 04/29/2016); Cataract extraction w/PHACO (Left, 11/01/2016); Cataract extraction w/PHACO (Right, 11/29/2016); Breast lumpectomy (Bilateral); Carpal tunnel release (Left, 10/23/2017); and Carpal tunnel release (Right, 11/20/2017).  Past Medical History:  OA, DM type 2, GERD, Headaches, HLD, HTN, Macular degeneration, Neuropathy, Osteopenia, Stress incontinence, COVID pneumonia September 2023  Constitutional:  BP 132/78 (BP Location: Left Arm, Patient Position: Sitting)   Pulse 94   Temp 97.9 F (36.6 C) (Temporal)   Ht '5\' 4"'$  (1.626 m)   Wt 180 lb 3.2 oz (81.7 kg)   SpO2 (!) 88% Comment: ra- oxygen given to patient after checking O2 sats on room air  BMI 30.93 kg/m   Brief Summary:  RUT BETTERTON is a 86 y.o. female former smoker with a cough.       Subjective:   She is here with her son.    She was in hospital in September with COVID pneumonia.  She has been using 2 to 3 liters oxygen at home.  She has cough with thick, clear phlegm.  Not having wheeze, fever, hemoptysis, or chest pain.  Slowly improving her activity level.  Sleeping okay.  Physical Exam:   Appearance - well kempt , wearing oxygen  ENMT - no sinus tenderness, no oral exudate, no LAN, Mallampati 3 airway, no stridor  Respiratory - b/ crackles that clear with coughing  CV - s1s2 regular rate and rhythm, no murmurs  Ext - no clubbing, no edema  Skin - no rashes  Psych - normal mood and affect         Pulmonary testing:  IgE 10/19/20 >> 13  Chest Imaging:  CT chest  03/16/21 >> LUL infiltrate, 8 mm nodule RUL CT chest 09/20/21 >> 8 mm nodule RUL, increased tree in bud in RUL, increased ASD RML, new infiltrate LLL and LUL  Social History:  She  reports that she quit smoking about 19 years ago. Her smoking use included cigarettes. She has a 40.00 pack-year smoking history. She has never used smokeless tobacco. She reports that she does not drink alcohol and does not use drugs.  Family History:  Her family history includes Melanoma in her brother.     Assessment/Plan:   Recurrent pneumonia. - most recently had COVID pneumonia in September 2023 - will plan for chest xray at her next follow up   Chronic mucopurulent bronchitis. - she tried doing PFT in October 2022, but had trouble doing test maneuvers; defer repeating for now - continue breztri and singulair - add mucinex and flutter valve bid - prn combivent - might need to set up chest vest if sputum production persists  8 mm rt lung nodule with history of smoking. - follow up CT chest without contrast in July 2024  Chronic respiratory failure with hypoxia and hypercapnia. - continue 2 to 3 liters oxygen 24/7 - goal SpO2 > 90% - reassess her O2 needs at next follow once she has more time to recover from COVID pneumonia  Chronic rhinitis with post nasal drip. - continue astepro, flonase, singulair - prn atrovent nasal spray  Time Spent Involved in Patient  Care on Day of Examination:  35 minutes  Follow up:   Patient Instructions  High dose flu shot today  Try using mucinex and your flutter valve twice per day to help with cough and bringing up phlegm  Follow up in 2 months  Medication List:   Allergies as of 11/26/2021       Reactions   Onion    wheezing        Medication List        Accurate as of November 26, 2021 11:37 AM. If you have any questions, ask your nurse or doctor.          acetaminophen-codeine 300-30 MG tablet Commonly known as: TYLENOL #3 Take 1  tablet by mouth every 6 (six) hours as needed for moderate pain.   albuterol (2.5 MG/3ML) 0.083% nebulizer solution Commonly known as: PROVENTIL Take 3 mLs (2.5 mg total) by nebulization every 6 (six) hours as needed for wheezing or shortness of breath.   albuterol-ipratropium 18-103 MCG/ACT inhaler Commonly known as: COMBIVENT Inhale 2 puffs into the lungs every 6 (six) hours as needed for wheezing.   Ipratropium-Albuterol 20-100 MCG/ACT Aers respimat Commonly known as: COMBIVENT Inhale 1 puff into the lungs every 6 (six) hours.   ARTIFICIAL TEARS OP Place 1 drop into both eyes 4 (four) times daily.   aspirin 81 MG tablet Take 81 mg by mouth daily.   atorvastatin 80 MG tablet Commonly known as: LIPITOR ALTERNATE TAKING 1 TABLET EVERY OTHER DAY WITH 1/2 TABLET EVERY OTHERDAY   Azelastine HCl 0.15 % Soln Commonly known as: Astepro Place 1 spray into the nose at bedtime.   Breztri Aerosphere 160-9-4.8 MCG/ACT Aero Generic drug: Budeson-Glycopyrrol-Formoterol Inhale 2 puffs into the lungs in the morning and at bedtime.   Fish Oil 1000 MG Caps Take 1,000 mg by mouth daily.   fluticasone 50 MCG/ACT nasal spray Commonly known as: FLONASE Place 1 spray into both nostrils daily.   gabapentin 400 MG capsule Commonly known as: NEURONTIN Take 400 mg by mouth 3 (three) times daily.   glimepiride 2 MG tablet Commonly known as: AMARYL Take 4 mg by mouth daily with breakfast.   guaiFENesin 600 MG 12 hr tablet Commonly known as: Mucinex Take 2 tablets (1,200 mg total) by mouth 2 (two) times daily as needed for cough or to loosen phlegm.   ipratropium 0.03 % nasal spray Commonly known as: ATROVENT Place 2 sprays into both nostrils every 12 (twelve) hours.   Lantus SoloStar 100 UNIT/ML Solostar Pen Generic drug: insulin glargine Inject into the skin.   losartan-hydrochlorothiazide 100-12.5 MG tablet Commonly known as: HYZAAR TAKE ONE (1) TABLET EACH DAY   metoprolol  tartrate 50 MG tablet Commonly known as: LOPRESSOR Take 50 mg by mouth 2 (two) times daily.   montelukast 10 MG tablet Commonly known as: SINGULAIR TAKE ONE TABLET BY MOUTH AT BEDTIME   oxybutynin 5 MG tablet Commonly known as: DITROPAN Take 5 mg by mouth every 8 (eight) hours as needed for bladder spasms.   ranitidine 150 MG tablet Commonly known as: ZANTAC Take 150 mg by mouth 2 (two) times daily.   sitaGLIPtin 100 MG tablet Commonly known as: JANUVIA Take 1 tablet (100 mg total) by mouth daily.   triamcinolone cream 0.1 % Commonly known as: KENALOG Apply 1 application topically daily as needed (eczema).        Signature:  Chesley Mires, MD [place] Pager - [phone] 11/26/2021, 11:37 AM

## 2021-11-27 DIAGNOSIS — H353231 Exudative age-related macular degeneration, bilateral, with active choroidal neovascularization: Secondary | ICD-10-CM | POA: Diagnosis not present

## 2021-11-28 ENCOUNTER — Telehealth: Payer: Self-pay | Admitting: Pulmonary Disease

## 2021-11-28 ENCOUNTER — Other Ambulatory Visit: Payer: Self-pay

## 2021-11-28 ENCOUNTER — Emergency Department (HOSPITAL_COMMUNITY): Payer: Medicare Other

## 2021-11-28 ENCOUNTER — Inpatient Hospital Stay (HOSPITAL_COMMUNITY)
Admission: EM | Admit: 2021-11-28 | Discharge: 2021-12-04 | DRG: 193 | Disposition: A | Discharge status: Home Health | Payer: Medicare Other | Attending: Internal Medicine | Admitting: Internal Medicine

## 2021-11-28 ENCOUNTER — Encounter (HOSPITAL_COMMUNITY): Payer: Self-pay | Admitting: *Deleted

## 2021-11-28 DIAGNOSIS — E876 Hypokalemia: Secondary | ICD-10-CM | POA: Diagnosis present

## 2021-11-28 DIAGNOSIS — Z7984 Long term (current) use of oral hypoglycemic drugs: Secondary | ICD-10-CM

## 2021-11-28 DIAGNOSIS — E1140 Type 2 diabetes mellitus with diabetic neuropathy, unspecified: Secondary | ICD-10-CM | POA: Diagnosis not present

## 2021-11-28 DIAGNOSIS — Z743 Need for continuous supervision: Secondary | ICD-10-CM | POA: Diagnosis not present

## 2021-11-28 DIAGNOSIS — E119 Type 2 diabetes mellitus without complications: Secondary | ICD-10-CM

## 2021-11-28 DIAGNOSIS — Z79899 Other long term (current) drug therapy: Secondary | ICD-10-CM

## 2021-11-28 DIAGNOSIS — J9621 Acute and chronic respiratory failure with hypoxia: Secondary | ICD-10-CM | POA: Diagnosis not present

## 2021-11-28 DIAGNOSIS — R6889 Other general symptoms and signs: Secondary | ICD-10-CM | POA: Diagnosis not present

## 2021-11-28 DIAGNOSIS — E11649 Type 2 diabetes mellitus with hypoglycemia without coma: Secondary | ICD-10-CM | POA: Diagnosis not present

## 2021-11-28 DIAGNOSIS — I2510 Atherosclerotic heart disease of native coronary artery without angina pectoris: Secondary | ICD-10-CM | POA: Diagnosis not present

## 2021-11-28 DIAGNOSIS — E669 Obesity, unspecified: Secondary | ICD-10-CM | POA: Diagnosis present

## 2021-11-28 DIAGNOSIS — J449 Chronic obstructive pulmonary disease, unspecified: Secondary | ICD-10-CM | POA: Diagnosis not present

## 2021-11-28 DIAGNOSIS — R911 Solitary pulmonary nodule: Secondary | ICD-10-CM | POA: Diagnosis not present

## 2021-11-28 DIAGNOSIS — D649 Anemia, unspecified: Secondary | ICD-10-CM | POA: Diagnosis not present

## 2021-11-28 DIAGNOSIS — D509 Iron deficiency anemia, unspecified: Secondary | ICD-10-CM | POA: Diagnosis not present

## 2021-11-28 DIAGNOSIS — U099 Post covid-19 condition, unspecified: Secondary | ICD-10-CM | POA: Diagnosis not present

## 2021-11-28 DIAGNOSIS — E1142 Type 2 diabetes mellitus with diabetic polyneuropathy: Secondary | ICD-10-CM | POA: Diagnosis not present

## 2021-11-28 DIAGNOSIS — J441 Chronic obstructive pulmonary disease with (acute) exacerbation: Secondary | ICD-10-CM | POA: Diagnosis present

## 2021-11-28 DIAGNOSIS — J90 Pleural effusion, not elsewhere classified: Secondary | ICD-10-CM | POA: Diagnosis not present

## 2021-11-28 DIAGNOSIS — Z8701 Personal history of pneumonia (recurrent): Secondary | ICD-10-CM

## 2021-11-28 DIAGNOSIS — M1990 Unspecified osteoarthritis, unspecified site: Secondary | ICD-10-CM | POA: Diagnosis not present

## 2021-11-28 DIAGNOSIS — Z91018 Allergy to other foods: Secondary | ICD-10-CM

## 2021-11-28 DIAGNOSIS — Z794 Long term (current) use of insulin: Secondary | ICD-10-CM | POA: Diagnosis not present

## 2021-11-28 DIAGNOSIS — J189 Pneumonia, unspecified organism: Principal | ICD-10-CM | POA: Diagnosis present

## 2021-11-28 DIAGNOSIS — R0902 Hypoxemia: Secondary | ICD-10-CM | POA: Diagnosis not present

## 2021-11-28 DIAGNOSIS — I10 Essential (primary) hypertension: Secondary | ICD-10-CM

## 2021-11-28 DIAGNOSIS — R0602 Shortness of breath: Secondary | ICD-10-CM | POA: Diagnosis not present

## 2021-11-28 DIAGNOSIS — Z9981 Dependence on supplemental oxygen: Secondary | ICD-10-CM | POA: Diagnosis not present

## 2021-11-28 DIAGNOSIS — K219 Gastro-esophageal reflux disease without esophagitis: Secondary | ICD-10-CM | POA: Diagnosis present

## 2021-11-28 DIAGNOSIS — R0600 Dyspnea, unspecified: Secondary | ICD-10-CM | POA: Diagnosis not present

## 2021-11-28 DIAGNOSIS — L8990 Pressure ulcer of unspecified site, unspecified stage: Secondary | ICD-10-CM | POA: Insufficient documentation

## 2021-11-28 DIAGNOSIS — J440 Chronic obstructive pulmonary disease with acute lower respiratory infection: Secondary | ICD-10-CM | POA: Diagnosis not present

## 2021-11-28 DIAGNOSIS — Z7982 Long term (current) use of aspirin: Secondary | ICD-10-CM | POA: Diagnosis not present

## 2021-11-28 DIAGNOSIS — Z87891 Personal history of nicotine dependence: Secondary | ICD-10-CM

## 2021-11-28 DIAGNOSIS — R0689 Other abnormalities of breathing: Secondary | ICD-10-CM | POA: Diagnosis not present

## 2021-11-28 DIAGNOSIS — J9601 Acute respiratory failure with hypoxia: Secondary | ICD-10-CM | POA: Diagnosis present

## 2021-11-28 DIAGNOSIS — J45909 Unspecified asthma, uncomplicated: Secondary | ICD-10-CM | POA: Diagnosis not present

## 2021-11-28 DIAGNOSIS — L89151 Pressure ulcer of sacral region, stage 1: Secondary | ICD-10-CM | POA: Diagnosis not present

## 2021-11-28 DIAGNOSIS — N179 Acute kidney failure, unspecified: Secondary | ICD-10-CM

## 2021-11-28 DIAGNOSIS — R1310 Dysphagia, unspecified: Secondary | ICD-10-CM | POA: Diagnosis not present

## 2021-11-28 DIAGNOSIS — Z6830 Body mass index (BMI) 30.0-30.9, adult: Secondary | ICD-10-CM

## 2021-11-28 DIAGNOSIS — U071 COVID-19: Secondary | ICD-10-CM | POA: Diagnosis not present

## 2021-11-28 DIAGNOSIS — Z1152 Encounter for screening for COVID-19: Secondary | ICD-10-CM | POA: Diagnosis not present

## 2021-11-28 DIAGNOSIS — I499 Cardiac arrhythmia, unspecified: Secondary | ICD-10-CM | POA: Diagnosis not present

## 2021-11-28 DIAGNOSIS — Z961 Presence of intraocular lens: Secondary | ICD-10-CM | POA: Diagnosis present

## 2021-11-28 DIAGNOSIS — M47816 Spondylosis without myelopathy or radiculopathy, lumbar region: Secondary | ICD-10-CM | POA: Diagnosis not present

## 2021-11-28 DIAGNOSIS — E785 Hyperlipidemia, unspecified: Secondary | ICD-10-CM | POA: Diagnosis not present

## 2021-11-28 DIAGNOSIS — M47814 Spondylosis without myelopathy or radiculopathy, thoracic region: Secondary | ICD-10-CM | POA: Diagnosis not present

## 2021-11-28 LAB — COMPREHENSIVE METABOLIC PANEL
ALT: 14 U/L (ref 0–44)
AST: 17 U/L (ref 15–41)
Albumin: 2.8 g/dL — ABNORMAL LOW (ref 3.5–5.0)
Alkaline Phosphatase: 38 U/L (ref 38–126)
Anion gap: 9 (ref 5–15)
BUN: 27 mg/dL — ABNORMAL HIGH (ref 8–23)
CO2: 25 mmol/L (ref 22–32)
Calcium: 9.2 mg/dL (ref 8.9–10.3)
Chloride: 106 mmol/L (ref 98–111)
Creatinine, Ser: 1.4 mg/dL — ABNORMAL HIGH (ref 0.44–1.00)
GFR, Estimated: 37 mL/min — ABNORMAL LOW (ref >60)
Glucose, Bld: 106 mg/dL — ABNORMAL HIGH (ref 70–99)
Potassium: 3.9 mmol/L (ref 3.5–5.1)
Sodium: 140 mmol/L (ref 135–145)
Total Bilirubin: 0.5 mg/dL (ref 0.3–1.2)
Total Protein: 6.8 g/dL (ref 6.5–8.1)

## 2021-11-28 LAB — BLOOD GAS, ARTERIAL
Acid-Base Excess: 1.9 mmol/L (ref 0.0–2.0)
Bicarbonate: 26.6 mmol/L (ref 20.0–28.0)
Drawn by: 23430
O2 Saturation: 86.9 %
Patient temperature: 37.6
pCO2 arterial: 42 mmHg (ref 32–48)
pH, Arterial: 7.41 (ref 7.35–7.45)
pO2, Arterial: 58 mmHg — ABNORMAL LOW (ref 83–108)

## 2021-11-28 LAB — GLUCOSE, CAPILLARY
Glucose-Capillary: 119 mg/dL — ABNORMAL HIGH (ref 70–99)
Glucose-Capillary: 63 mg/dL — ABNORMAL LOW (ref 70–99)
Glucose-Capillary: 77 mg/dL (ref 70–99)

## 2021-11-28 LAB — RESP PANEL BY RT-PCR (FLU A&B, COVID) ARPGX2
Influenza A by PCR: NEGATIVE
Influenza B by PCR: NEGATIVE
SARS Coronavirus 2 by RT PCR: NEGATIVE

## 2021-11-28 LAB — CBC
HCT: 28.3 % — ABNORMAL LOW (ref 36.0–46.0)
Hemoglobin: 8.4 g/dL — ABNORMAL LOW (ref 12.0–15.0)
MCH: 23.3 pg — ABNORMAL LOW (ref 26.0–34.0)
MCHC: 29.7 g/dL — ABNORMAL LOW (ref 30.0–36.0)
MCV: 78.6 fL — ABNORMAL LOW (ref 80.0–100.0)
Platelets: 304 10*3/uL (ref 150–400)
RBC: 3.6 MIL/uL — ABNORMAL LOW (ref 3.87–5.11)
RDW: 22.9 % — ABNORMAL HIGH (ref 11.5–15.5)
WBC: 10.9 10*3/uL — ABNORMAL HIGH (ref 4.0–10.5)
nRBC: 0.5 % — ABNORMAL HIGH (ref 0.0–0.2)

## 2021-11-28 LAB — BRAIN NATRIURETIC PEPTIDE: B Natriuretic Peptide: 329 pg/mL — ABNORMAL HIGH (ref 0.0–100.0)

## 2021-11-28 LAB — D-DIMER, QUANTITATIVE: D-Dimer, Quant: 5.08 ug/mL-FEU — ABNORMAL HIGH (ref 0.00–0.50)

## 2021-11-28 MED ORDER — ONDANSETRON HCL 4 MG/2ML IJ SOLN: 4.0000 mg | Freq: Four times a day (QID) | INTRAMUSCULAR | Status: DC | PRN

## 2021-11-28 MED ORDER — DEXTROSE 50 % IV SOLN
12.5000 g | INTRAVENOUS | Status: AC
  Administered 2021-11-28: 12.5 g via INTRAVENOUS
  Filled 2021-11-28: qty 50

## 2021-11-28 MED ORDER — CHLORHEXIDINE GLUCONATE CLOTH 2 % EX PADS
6.0000 | MEDICATED_PAD | Freq: Every day | CUTANEOUS | Status: DC
  Administered 2021-11-29 – 2021-12-03 (×5): 6 via TOPICAL

## 2021-11-28 MED ORDER — SODIUM CHLORIDE 0.9 % IV SOLN
500.0000 mg | INTRAVENOUS | Status: AC
  Administered 2021-11-29 – 2021-12-03 (×5): 500 mg via INTRAVENOUS
  Filled 2021-11-28 (×5): qty 5

## 2021-11-28 MED ORDER — ACETAMINOPHEN 325 MG PO TABS
650.0000 mg | ORAL_TABLET | Freq: Four times a day (QID) | ORAL | Status: DC | PRN
  Administered 2021-12-01 – 2021-12-02 (×2): 650 mg via ORAL
  Filled 2021-11-28 (×2): qty 2

## 2021-11-28 MED ORDER — INSULIN ASPART 100 UNIT/ML IJ SOLN
0.0000 [IU] | INTRAMUSCULAR | Status: DC
  Administered 2021-11-29 – 2021-11-30 (×3): 1 [IU] via SUBCUTANEOUS
  Administered 2021-11-30 (×2): 2 [IU] via SUBCUTANEOUS
  Administered 2021-12-01 (×2): 1 [IU] via SUBCUTANEOUS
  Administered 2021-12-01: 2 [IU] via SUBCUTANEOUS
  Administered 2021-12-01 – 2021-12-03 (×7): 1 [IU] via SUBCUTANEOUS
  Administered 2021-12-03: 2 [IU] via SUBCUTANEOUS
  Administered 2021-12-03 – 2021-12-04 (×2): 1 [IU] via SUBCUTANEOUS

## 2021-11-28 MED ORDER — IPRATROPIUM-ALBUTEROL 0.5-2.5 (3) MG/3ML IN SOLN
3.0000 mL | RESPIRATORY_TRACT | Status: DC | PRN
  Filled 2021-11-28: qty 3

## 2021-11-28 MED ORDER — SODIUM CHLORIDE 0.9 % IV BOLUS
500.0000 mL | Freq: Once | INTRAVENOUS | Status: AC
  Administered 2021-11-28: 500 mL via INTRAVENOUS

## 2021-11-28 MED ORDER — SODIUM CHLORIDE 0.9 % IV SOLN
2.0000 g | Freq: Once | INTRAVENOUS | Status: AC
  Administered 2021-11-28: 2 g via INTRAVENOUS
  Filled 2021-11-28: qty 20

## 2021-11-28 MED ORDER — ONDANSETRON HCL 4 MG PO TABS
4.0000 mg | ORAL_TABLET | Freq: Four times a day (QID) | ORAL | Status: DC | PRN
  Administered 2021-12-03: 4 mg via ORAL
  Filled 2021-11-28: qty 1

## 2021-11-28 MED ORDER — ACETAMINOPHEN 650 MG RE SUPP: 650.0000 mg | Freq: Four times a day (QID) | RECTAL | Status: DC | PRN

## 2021-11-28 MED ORDER — FLUTICASONE FUROATE-VILANTEROL 200-25 MCG/ACT IN AEPB
1.0000 | INHALATION_SPRAY | Freq: Every day | RESPIRATORY_TRACT | Status: DC
  Administered 2021-11-29: 1 via RESPIRATORY_TRACT
  Filled 2021-11-28: qty 28

## 2021-11-28 MED ORDER — AZITHROMYCIN 250 MG PO TABS
500.0000 mg | ORAL_TABLET | Freq: Once | ORAL | Status: AC
  Administered 2021-11-28: 500 mg via ORAL
  Filled 2021-11-28: qty 2

## 2021-11-28 MED ORDER — BUDESON-GLYCOPYRROL-FORMOTEROL 160-9-4.8 MCG/ACT IN AERO: 2.0000 | INHALATION_SPRAY | Freq: Two times a day (BID) | RESPIRATORY_TRACT | Status: DC

## 2021-11-28 MED ORDER — HEPARIN SODIUM (PORCINE) 5000 UNIT/ML IJ SOLN
5000.0000 [IU] | Freq: Three times a day (TID) | INTRAMUSCULAR | Status: DC
  Administered 2021-11-28 – 2021-12-04 (×17): 5000 [IU] via SUBCUTANEOUS
  Filled 2021-11-28 (×17): qty 1

## 2021-11-28 MED ORDER — IOHEXOL 350 MG/ML SOLN
75.0000 mL | Freq: Once | INTRAVENOUS | Status: AC | PRN
  Administered 2021-11-28: 75 mL via INTRAVENOUS

## 2021-11-28 MED ORDER — CHLORHEXIDINE GLUCONATE CLOTH 2 % EX PADS: 6.0000 | MEDICATED_PAD | Freq: Every day | CUTANEOUS | Status: DC

## 2021-11-28 MED ORDER — SODIUM CHLORIDE 0.9 % IV SOLN
2.0000 g | INTRAVENOUS | Status: AC
  Administered 2021-11-29 – 2021-12-03 (×5): 2 g via INTRAVENOUS
  Filled 2021-11-28 (×6): qty 20

## 2021-11-28 MED ORDER — UMECLIDINIUM BROMIDE 62.5 MCG/ACT IN AEPB
1.0000 | INHALATION_SPRAY | Freq: Every day | RESPIRATORY_TRACT | Status: DC
  Administered 2021-11-29: 1 via RESPIRATORY_TRACT
  Filled 2021-11-28: qty 7

## 2021-11-28 MED ORDER — FUROSEMIDE 10 MG/ML IJ SOLN
40.0000 mg | Freq: Once | INTRAMUSCULAR | Status: AC
  Administered 2021-11-28: 40 mg via INTRAVENOUS
  Filled 2021-11-28: qty 4

## 2021-11-28 MED ORDER — IPRATROPIUM-ALBUTEROL 0.5-2.5 (3) MG/3ML IN SOLN
3.0000 mL | Freq: Four times a day (QID) | RESPIRATORY_TRACT | Status: AC
  Administered 2021-11-28 – 2021-11-29 (×3): 3 mL via RESPIRATORY_TRACT
  Filled 2021-11-28 (×3): qty 3

## 2021-11-28 MED ORDER — POLYETHYLENE GLYCOL 3350 17 G PO PACK: 17.0000 g | PACK | Freq: Every day | ORAL | Status: DC | PRN

## 2021-11-28 NOTE — Assessment & Plan Note (Signed)
Stable. -N.p.o. for now, hold losartan/HCTZ, metoprolol 50 twice daily -As needed labetalol IV 10 mg

## 2021-11-28 NOTE — Telephone Encounter (Signed)
ATC patients son [name] for more details. No answer. LVM letting patients son know that we received the message that patient was on the way to [facility] via EMS for her O2 being low and that I would send a message to Dr. Halford Chessman as an Juluis Rainier.

## 2021-11-28 NOTE — Assessment & Plan Note (Signed)
Controlled.  A1c 6.1. - SSI- S q6h -Hold metformin, Januvia, Lantus 20 units twice daily, glimepiride

## 2021-11-28 NOTE — ED Provider Notes (Signed)
[facility] EMERGENCY DEPARTMENT Provider Note   CSN: [id number] Arrival date & time: 11/28/21  1210     History {Add pertinent medical, surgical, social history, OB history to HPI:1}   Michaela Moon is a 86 y.o. female.  HPI   Patient has a history of hypertension hyperlipidemia, diabetes, COPD, asthma, reflux.  Patient was admitted to another health system last month for issues with acute kidney injury, covid and pneumonia. Patient had been recovering at home.  She was able to follow-up with her pulmonary doctor on the second.  She has noticed some increasing coughing in the last couple of days.  She has had clear phlegm but no fevers.  This morning however her oxygen level was low, below 90s despite her supplemental oxygen.  She was brought to the ED for further evaluation.  Patient has noticed some leg swelling but it seems to be decreasing.  It was worse when she was in the hospital. Home Medications Prior to Admission medications   Medication Sig Start Date End Date Taking? Authorizing Provider  albuterol (PROVENTIL) (2.5 MG/3ML) 0.083% nebulizer solution Take 3 mLs (2.5 mg total) by nebulization every 6 (six) hours as needed for wheezing or shortness of breath. 02/14/21  Yes Chesley Mires, MD  albuterol-ipratropium (COMBIVENT) 18-103 MCG/ACT inhaler Inhale 2 puffs into the lungs every 6 (six) hours as needed for wheezing. 11/27/12  Yes Vernie Shanks, MD  aspirin 81 MG tablet Take 81 mg by mouth daily.   Yes [provider]  atorvastatin (LIPITOR) 80 MG tablet ALTERNATE TAKING 1 TABLET EVERY OTHER DAY WITH 1/2 TABLET EVERY OTHERDAY 01/13/14  Yes Chipper Herb, MD  Azelastine HCl (ASTEPRO) 0.15 % SOLN Place 1 spray into the nose at bedtime. 04/24/21  Yes Chesley Mires, MD  Biotin w/ Vitamins C & E (HAIR/SKIN/NAILS PO) Take 1 tablet by mouth daily. Takes once a day.   Yes [provider]  Budeson-Glycopyrrol-Formoterol (BREZTRI AEROSPHERE) 160-9-4.8 MCG/ACT AERO  Inhale 2 puffs into the lungs in the morning and at bedtime. 03/08/21  Yes Chesley Mires, MD  fluticasone (FLONASE) 50 MCG/ACT nasal spray Place 1 spray into both nostrils daily. 10/19/20  Yes Chesley Mires, MD  gabapentin (NEURONTIN) 400 MG capsule Take 400 mg by mouth 3 (three) times daily.   Yes [provider]  glimepiride (AMARYL) 2 MG tablet Take 4 mg by mouth daily with breakfast.    Yes [provider]  guaiFENesin (MUCINEX) 600 MG 12 hr tablet Take 2 tablets (1,200 mg total) by mouth 2 (two) times daily as needed for cough or to loosen phlegm. 02/14/21  Yes Chesley Mires, MD  Hypromellose (ARTIFICIAL TEARS OP) Place 1 drop into both eyes 4 (four) times daily.   Yes [provider]  ipratropium (ATROVENT) 0.03 % nasal spray Place 2 sprays into both nostrils every 12 (twelve) hours. 02/14/21  Yes Chesley Mires, MD  Ipratropium-Albuterol (COMBIVENT) 20-100 MCG/ACT AERS respimat Inhale 1 puff into the lungs every 6 (six) hours. 10/01/21  Yes Chesley Mires, MD  LANTUS SOLOSTAR 100 UNIT/ML Solostar Pen Inject 20 Units into the skin in the morning and at bedtime. 11/22/20  Yes [provider]  losartan-hydrochlorothiazide (HYZAAR) 100-12.5 MG per tablet TAKE ONE (1) TABLET EACH DAY Patient taking differently: Take 1 tablet by mouth daily. 03/09/14  Yes Chipper Herb, MD  metoprolol (LOPRESSOR) 50 MG tablet Take 50 mg by mouth 2 (two) times daily.   Yes [provider]  montelukast (SINGULAIR) 10 MG  tablet TAKE ONE TABLET BY MOUTH AT BEDTIME 07/30/21  Yes Chesley Mires, MD  Omega-3 Fatty Acids (FISH OIL) 1000 MG CAPS Take 1,000 mg by mouth in the morning, at noon, and at bedtime.   Yes [provider]  oxybutynin (DITROPAN) 5 MG tablet Take 5 mg by mouth every 8 (eight) hours as needed for bladder spasms.    Yes [provider]  pantoprazole (PROTONIX) 40 MG tablet Take 40 mg by mouth daily. 10/23/21  Yes [provider]   sitaGLIPtin-metformin (JANUMET) 50-1000 MG tablet Take 1 tablet by mouth 2 (two) times daily with a meal.   Yes [provider]  triamcinolone cream (KENALOG) 0.1 % Apply 1 application topically daily as needed (eczema).   Yes [provider]      Allergies    Onion    Review of Systems   Review of Systems  Physical Exam Updated Vital Signs BP (!) 115/52   Pulse 96   Temp 99.6 F (37.6 C) (Oral)   Resp 13   Ht 1.626 m ('5\' 4"'$ )   Wt 81.7 kg   SpO2 93%   BMI 30.93 kg/m  Physical Exam Vitals and nursing note reviewed.  Constitutional:      General: She is not in acute distress.    Appearance: She is well-developed.  HENT:     Head: Normocephalic and atraumatic.     Right Ear: External ear normal.     Left Ear: External ear normal.  Eyes:     General: No scleral icterus.       Right eye: No discharge.        Left eye: No discharge.     Conjunctiva/sclera: Conjunctivae normal.  Neck:     Trachea: No tracheal deviation.  Cardiovascular:     Rate and Rhythm: Normal rate and regular rhythm.  Pulmonary:     Effort: Pulmonary effort is normal. No respiratory distress.     Breath sounds: No stridor. Rhonchi and rales present. No wheezing.  Abdominal:     General: Bowel sounds are normal. There is no distension.     Palpations: Abdomen is soft.     Tenderness: There is no abdominal tenderness. There is no guarding or rebound.  Musculoskeletal:        General: No tenderness or deformity.     Cervical back: Neck supple.     Right lower leg: Edema present.     Left lower leg: Edema present.  Skin:    General: Skin is warm and dry.     Findings: No rash.  Neurological:     General: No focal deficit present.     Mental Status: She is alert.     Cranial Nerves: No cranial nerve deficit (no facial droop, extraocular movements intact, no slurred speech).     Sensory: No sensory deficit.     Motor: No abnormal muscle tone or seizure activity.      Coordination: Coordination normal.  Psychiatric:        Mood and Affect: Mood normal.     ED Results / Procedures / Treatments   Labs (all labs ordered are listed, but only abnormal results are displayed) Labs Reviewed  COMPREHENSIVE METABOLIC PANEL - Abnormal; Notable for the following components:      Result Value   Glucose, Bld 106 (*)    BUN 27 (*)    Creatinine, Ser 1.40 (*)    Albumin 2.8 (*)    GFR, Estimated 37 (*)  All other components within normal limits  CBC - Abnormal; Notable for the following components:   WBC 10.9 (*)    RBC 3.60 (*)    Hemoglobin 8.4 (*)    HCT 28.3 (*)    MCV 78.6 (*)    MCH 23.3 (*)    MCHC 29.7 (*)    RDW 22.9 (*)    nRBC 0.5 (*)    All other components within normal limits  BLOOD GAS, ARTERIAL - Abnormal; Notable for the following components:   pO2, Arterial 58 (*)    All other components within normal limits  D-DIMER, QUANTITATIVE - Abnormal; Notable for the following components:   D-Dimer, Quant 5.08 (*)    All other components within normal limits  RESP PANEL BY RT-PCR (FLU A&B, COVID) ARPGX2  BRAIN NATRIURETIC PEPTIDE    EKG EKG Interpretation  Date/Time:  Wednesday November 28 2021 12:22:00 EDT Ventricular Rate:  105 PR Interval:  150 QRS Duration: 84 QT Interval:  323 QTC Calculation: 427 R Axis:   49 Text Interpretation: Sinus tachycardia Multiple ventricular premature complexes Low voltage, precordial leads pvcs are new since last tracing Confirmed by Dorie Rank [phone]) on 11/28/2021 1:00:46 PM  Radiology DG Chest Port 1 View  Result Date: 11/28/2021 CLINICAL DATA:  Dyspnea, shaking, hypoxia, history COPD, asthma, diabetes mellitus, hypertension EXAM: PORTABLE CHEST 1 VIEW COMPARISON:  Portable exam 1304 hours compared to 10/31/2021 FINDINGS: Upper normal size of cardiac silhouette. Mediastinal contours and pulmonary vascularity normal. Atherosclerotic calcification aorta. Bibasilar infiltrates question multifocal  pneumonia. Minimal pleural effusions. No pneumothorax. Bones demineralized with posttraumatic deformity proximal LEFT humerus and multilevel degenerative disc disease changes/scoliosis thoracic spine. IMPRESSION: Bibasilar pulmonary infiltrates question multifocal pneumonia. Minimal pleural effusions. Aortic Atherosclerosis (ICD10-I70.0). Electronically Signed   By: Lavonia Dana M.D.   On: 11/28/2021 13:15    Procedures Procedures  {Document cardiac monitor, telemetry assessment procedure when appropriate:1}  Medications Ordered in ED Medications  sodium chloride 0.9 % bolus 500 mL (has no administration in time range)  cefTRIAXone (ROCEPHIN) 2 g in sodium chloride 0.9 % 100 mL IVPB (has no administration in time range)  azithromycin (ZITHROMAX) tablet 500 mg (500 mg Oral Given 11/28/21 1548)  iohexol (OMNIPAQUE) 350 MG/ML injection 75 mL (75 mLs Intravenous Contrast Given 11/28/21 1537)    ED Course/ Medical Decision Making/ A&P Clinical Course as of 11/28/21 1549  Wed Nov 28, 2021  1458 CBC(!) Hemoglobin decreased to 8.4 [JK]  1458 Comprehensive metabolic panel(!) reatinine elevated to 1.4 [JK]  1458 D-dimer, quantitative(!) D-dimer elevated 5.08 [JK]  1459 Chest x-ray shows multifocal pneumonia [JK]  1546 Hemoglobin was 9.7,3 weeks ago.  [JK]    Clinical Course User Index [JK] Dorie Rank, MD                           Medical Decision Making Problems Addressed: Acute respiratory failure with hypoxemia [facility]): acute illness or injury that poses a threat to life or bodily functions Anemia, unspecified type: chronic illness or injury Multifocal pneumonia: acute illness or injury that poses a threat to life or bodily functions  Amount and/or Complexity of Data Reviewed Labs: ordered. Decision-making details documented in ED Course. Radiology: ordered and independent interpretation performed.  Risk Prescription drug management. Decision regarding hospitalization.   Patient with  complicated medical history.  On chronic home O2.  Presented to the ED for increasing shortness of breath.  Weakness.  Patient noted to have increasing oxygen  requirement today.  Patient not complaining of any severe respiratory difficulty but noted to have rhonchi and crackles on exam.  Patient's chest x-ray suggest possible multifocal pneumonia.  Also concerned about the possibility of CHF exacerbation as well as pulmonary embolism.  COPD exacerbation concern as well.  No significant wheezing noted on exam.  Chest x-ray suggests multifocal pneumonia.  Patient's D-dimer is also elevated.  Hgb decreasing compared to previous.  Will order poc stool.  No sx of acute gi bleeding.  Will start on abx, for pna.  Plan on CT to rule out PE.  Admit to the hospital for further treatment.  {Document critical care time when appropriate:1} {Document review of labs and clinical decision tools ie heart score, Chads2Vasc2 etc:1}  {Document your independent review of radiology images, and any outside records:1} {Document your discussion with family members, caretakers, and with consultants:1} {Document social determinants of health affecting pt's care:1} {Document your decision making why or why not admission, treatments were needed:1} Final Clinical Impression(s) / ED Diagnoses Final diagnoses:  Multifocal pneumonia  Acute respiratory failure with hypoxemia ([place])  Anemia, unspecified type    Rx / DC Orders ED Discharge Orders     None

## 2021-11-28 NOTE — Assessment & Plan Note (Signed)
Creatinine elevated 1.4.  Baseline 0.7-0.8.  Has 2+ pitting edema bilateral lower extremity, this is chronic. -Monitor creatinine for now, contrast exposure for CTA - May benefit from a dose of Lasix prior to discharge

## 2021-11-28 NOTE — Assessment & Plan Note (Signed)
O2 sats down to 79% on 3 L.  Currently on high flow nasal cannula sats 92 to 100%.  ABG shows pH of 7.4, PCO2 of 42, PO2 of 58.  Likely secondary to multifocal pneumonia, possible component of COPD exacerbation.

## 2021-11-28 NOTE — ED Triage Notes (Addendum)
Pt brought in by [facility] from home with c/o shaking more than normal and her O2 sat 85% on 8L O2 via [place] per son and physical therapist. Pt denies any complaints including chest pain and SOB. Hx of COPD, reports her breathing is no worse than normal. Pt normally wears 2L O2 via [place]. BP 103/63, HR 103, O2 sat 81% and CBG 148 for EMS. Pt does report non-productive cough for a few days. O2 sat 79% on 3L O2 via [place] upon arrival to ED.

## 2021-11-28 NOTE — Assessment & Plan Note (Addendum)
Incidental finding - the 1 cm nodule in the anterior right upper lobe appears subsolid on today's exam. As previously noted, bronchogenic carcinoma cannot be excluded. Recommend attention on follow-up imaging. -Follow-up as outpatient

## 2021-11-28 NOTE — Telephone Encounter (Signed)
pts son states the pt is on her way to  by ems for low oxygen. please advise

## 2021-11-28 NOTE — Telephone Encounter (Signed)
I will check to see if she gets admitted to the hospital and whether the hospitalist need a pulmonary consultation.

## 2021-11-28 NOTE — H&P (Addendum)
History and Physical    Michaela Moon JKK:[id number] DOB: 03-26-1935 DOA: 11/28/2021  PCP: [name], MD   Patient coming from: Home  I have personally briefly reviewed patient's old medical records in [place]  Chief Complaint: Low O2 sats, Cough  HPI: Michaela Moon is a 86 y.o. female with medical history significant for COPD, diabetes mellitus, hypertension, dysphagia requiring dilation.  Patient was brought to the ED with low oxygen level, sats in the 80s on home 2 L.  Patient was recently admitted at [facility]- admitted 9/2 to 9/12 for acute respiratory failure, required BiPAP.  She was positive for COVID and treated with antibiotics and Paxlovid for multifocal pneumonia.  Also given a dose of diuretic. Was discharged home on new oxygen requirement of 2 L, and to complete course of antibiotics.  On discharge patient was doing well.  Symptoms improved.  SonCarloyn Moon who is patient's caregiver is at bedside.  On my evaluation patient is sleepy, a bit lethargic, but arouses to voice and follows directions answers questions.  She has chronic difficulty breathing, and they do not think this has changed.  Over the past few days she has had worsening cough.  Prior to today she had good O2 saturations.  She has bilateral lower extremity swelling which they feel is improving compared to prior.  No chest pains.  She has ongoing swallowing problems feeling that food is stuck in her throat/chest.  No NSAID use.  No black stools no blood in stools no vomiting.  ED Course: Tmax 99.6.  Heart rate 93-107.  Respiratory rate 13-18.  Blood pressure systolic mostly ranging from 103-121.  O2 sat 79% on 3 L.  She was initially placed on nonrebreather then switched to high flow nasal cannula. ABG showed pH of 7.4.,  PCO2 of 42.  PO2 of 58.  Chest x-ray showed multifocal pneumonia.  D-dimer elevated at 5.08.  Creatinine elevated at 1.4. CTA chest negative for PE, shows multifocal dependent located  consolidations bilaterally favoring multifocal pneumonia possibly aspiration related. IV ceftriaxone and azithromycin given.  500 mill bolus given.  Hospitalist to admit.  Review of Systems: As per HPI all other systems reviewed and negative.  Past Medical History:  Diagnosis Date   Arthritis    Asthma    COPD (chronic obstructive pulmonary disease) ([place])    Diabetes mellitus without complication (HCC)    GERD (gastroesophageal reflux disease)    Headache    Hyperlipidemia    Hypertension    Macular degeneration    Neuropathy    Obesity    Osteopenia    Stress incontinence     Past Surgical History:  Procedure Laterality Date   BREAST LUMPECTOMY Bilateral    no cancer   CARPAL TUNNEL RELEASE Left 10/23/2017   Procedure: LEFT CARPAL TUNNEL RELEASE;  Surgeon: Carole Civil, MD;  Location: [facility];  Service: Orthopedics;  Laterality: Left;   CARPAL TUNNEL RELEASE Right 11/20/2017   Procedure: CARPAL TUNNEL RELEASE;  Surgeon: Carole Civil, MD;  Location: [facility];  Service: Orthopedics;  Laterality: Right;   CATARACT EXTRACTION W/PHACO Left 11/01/2016   Procedure: CATARACT EXTRACTION PHACO AND INTRAOCULAR LENS PLACEMENT (IOC);  Surgeon: Baruch Goldmann, MD;  Location: [facility];  Service: Ophthalmology;  Laterality: Left;  CDE: 3.83   CATARACT EXTRACTION W/PHACO Right 11/29/2016   Procedure: CATARACT EXTRACTION PHACO AND INTRAOCULAR LENS PLACEMENT RIGHT EYE;  Surgeon: Baruch Goldmann, MD;  Location: [facility];  Service: Ophthalmology;  Laterality: Right;  CDE: 8.71   colon tumor removed  2010   Dr. 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 submucosal lipoma on path   COLONOSCOPY  05/2013   Dr. Anthony Sar: normal. (h/o colon polyps)   COLONOSCOPY  2011   normal   COLONOSCOPY  10/2003   Dr. Lindalou Hose: large polypoid mass hepatic fluexure   ESOPHAGOGASTRODUODENOSCOPY N/A 04/29/2016   Procedure: ESOPHAGOGASTRODUODENOSCOPY (EGD);  Surgeon: Danie Binder,  MD;  Location: [facility];  Service: Endoscopy;  Laterality: N/A;  9:15am   SAVORY DILATION N/A 04/29/2016   Procedure: SAVORY DILATION;  Surgeon: Danie Binder, MD;  Location: [facility];  Service: Endoscopy;  Laterality: N/A;     reports that she quit smoking about 19 years ago. Her smoking use included cigarettes. She has a 40.00 pack-year smoking history. She has never used smokeless tobacco. She reports that she does not drink alcohol and does not use drugs.  Allergies  Allergen Reactions   Onion     wheezing    Family History  Problem Relation Age of Onset   Melanoma Brother    Colon cancer Neg Hx    Prior to Admission medications   Medication Sig Start Date End Date Taking? Authorizing Provider  albuterol (PROVENTIL) (2.5 MG/3ML) 0.083% nebulizer solution Take 3 mLs (2.5 mg total) by nebulization every 6 (six) hours as needed for wheezing or shortness of breath. 02/14/21  Yes Chesley Mires, MD  albuterol-ipratropium (COMBIVENT) 18-103 MCG/ACT inhaler Inhale 2 puffs into the lungs every 6 (six) hours as needed for wheezing. 11/27/12  Yes Vernie Shanks, MD  aspirin 81 MG tablet Take 81 mg by mouth daily.   Yes [provider]  atorvastatin (LIPITOR) 80 MG tablet ALTERNATE TAKING 1 TABLET EVERY OTHER DAY WITH 1/2 TABLET EVERY OTHERDAY 01/13/14  Yes Chipper Herb, MD  Azelastine HCl (ASTEPRO) 0.15 % SOLN Place 1 spray into the nose at bedtime. 04/24/21  Yes Chesley Mires, MD  Biotin w/ Vitamins C & E (HAIR/SKIN/NAILS PO) Take 1 tablet by mouth daily. Takes once a day.   Yes [provider]  Budeson-Glycopyrrol-Formoterol (BREZTRI AEROSPHERE) 160-9-4.8 MCG/ACT AERO Inhale 2 puffs into the lungs in the morning and at bedtime. 03/08/21  Yes Chesley Mires, MD  fluticasone (FLONASE) 50 MCG/ACT nasal spray Place 1 spray into both nostrils daily. 10/19/20  Yes Chesley Mires, MD  gabapentin (NEURONTIN) 400 MG capsule Take 400 mg by mouth 3 (three) times daily.   Yes  [provider]  glimepiride (AMARYL) 2 MG tablet Take 4 mg by mouth daily with breakfast.    Yes [provider]  guaiFENesin (MUCINEX) 600 MG 12 hr tablet Take 2 tablets (1,200 mg total) by mouth 2 (two) times daily as needed for cough or to loosen phlegm. 02/14/21  Yes Chesley Mires, MD  Hypromellose (ARTIFICIAL TEARS OP) Place 1 drop into both eyes 4 (four) times daily.   Yes [provider]  ipratropium (ATROVENT) 0.03 % nasal spray Place 2 sprays into both nostrils every 12 (twelve) hours. 02/14/21  Yes Chesley Mires, MD  Ipratropium-Albuterol (COMBIVENT) 20-100 MCG/ACT AERS respimat Inhale 1 puff into the lungs every 6 (six) hours. 10/01/21  Yes Chesley Mires, MD  LANTUS SOLOSTAR 100 UNIT/ML Solostar Pen Inject 20 Units into the skin in the morning and at bedtime. 11/22/20  Yes [provider]  losartan-hydrochlorothiazide (HYZAAR) 100-12.5 MG per tablet TAKE ONE (1) TABLET EACH DAY Patient taking differently: Take  1 tablet by mouth daily. 03/09/14  Yes Chipper Herb, MD  metoprolol (LOPRESSOR) 50 MG tablet Take 50 mg by mouth 2 (two) times daily.   Yes [provider]  montelukast (SINGULAIR) 10 MG tablet TAKE ONE TABLET BY MOUTH AT BEDTIME 07/30/21  Yes Chesley Mires, MD  Omega-3 Fatty Acids (FISH OIL) 1000 MG CAPS Take 1,000 mg by mouth in the morning, at noon, and at bedtime.   Yes [provider]  oxybutynin (DITROPAN) 5 MG tablet Take 5 mg by mouth every 8 (eight) hours as needed for bladder spasms.    Yes [provider]  pantoprazole (PROTONIX) 40 MG tablet Take 40 mg by mouth daily. 10/23/21  Yes [provider]  sitaGLIPtin-metformin (JANUMET) 50-1000 MG tablet Take 1 tablet by mouth 2 (two) times daily with a meal.   Yes [provider]  triamcinolone cream (KENALOG) 0.1 % Apply 1 application topically daily as needed (eczema).   Yes [provider]    Physical Exam: Vitals:   11/28/21 1400 11/28/21  1432 11/28/21 1500 11/28/21 1530  BP: (!) 101/55 (!) 114/53 (!) 117/46 (!) 115/52  Pulse: 94 99 93 96  Resp: '13 15 16 13  '$ Temp:      TempSrc:      SpO2: (!) 85%  93% 97%  Weight:      Height:        Constitutional: lethargic, sleepy but arouses to voice,  Vitals:   11/28/21 1400 11/28/21 1432 11/28/21 1500 11/28/21 1530  BP: (!) 101/55 (!) 114/53 (!) 117/46 (!) 115/52  Pulse: 94 99 93 96  Resp: '13 15 16 13  '$ Temp:      TempSrc:      SpO2: (!) 85%  93% 97%  Weight:      Height:       Eyes: PERRL, lids and conjunctivae normal ENMT: Mucous membranes are moist.   Neck: normal, supple, no masses, no thyromegaly Respiratory: On high flow nasal cannula, rattling in chest likely from secretions,  Cardiovascular: Regular rate and rhythm, no murmurs / rubs / gallops.  2+ pitting lower extremity edema to mid leg .  Lower extremities warm.   Abdomen: no tenderness, no masses palpated. No hepatosplenomegaly. Bowel sounds positive.  Musculoskeletal: no clubbing / cyanosis. No joint deformity upper and lower extremities. Good ROM, no contractures. Normal muscle tone.  Skin: no rashes, lesions, ulcers. No induration Neurologic: 4+/5 strength in all extremities.  Psychiatric: Lethargic, but arouses to voice follows directions, assess questions.  Labs on Admission: I have personally reviewed following labs and imaging studies  CBC: Recent Labs  Lab 11/28/21 1236  WBC 10.9*  HGB 8.4*  HCT 28.3*  MCV 78.6*  PLT 130   Basic Metabolic Panel: Recent Labs  Lab 11/28/21 1236  NA 140  K 3.9  CL 106  CO2 25  GLUCOSE 106*  BUN 27*  CREATININE 1.40*  CALCIUM 9.2   GFR: Estimated Creatinine Clearance: 30.9 mL/min (A) (by C-G formula based on SCr of 1.4 mg/dL (H)). Liver Function Tests: Recent Labs  Lab 11/28/21 1236  AST 17  ALT 14  ALKPHOS 38  BILITOT 0.5  PROT 6.8  ALBUMIN 2.8*    Radiological Exams on Admission: DG Chest Port 1 View  Result Date: 11/28/2021 CLINICAL  DATA:  Dyspnea, shaking, hypoxia, history COPD, asthma, diabetes mellitus, hypertension EXAM: PORTABLE CHEST 1 VIEW COMPARISON:  Portable exam 1304 hours compared to 10/31/2021 FINDINGS: Upper normal size of cardiac silhouette. Mediastinal  contours and pulmonary vascularity normal. Atherosclerotic calcification aorta. Bibasilar infiltrates question multifocal pneumonia. Minimal pleural effusions. No pneumothorax. Bones demineralized with posttraumatic deformity proximal LEFT humerus and multilevel degenerative disc disease changes/scoliosis thoracic spine. IMPRESSION: Bibasilar pulmonary infiltrates question multifocal pneumonia. Minimal pleural effusions. Aortic Atherosclerosis (ICD10-I70.0). Electronically Signed   By: Lavonia Dana M.D.   On: 11/28/2021 13:15    EKG: Independently reviewed.  Sinus tachycardia rate 105.  QTc 427.  PVCs present.  Sinus arrhythmia.  No change from prior.  Assessment/Plan Principal Problem:   Multifocal pneumonia Active Problems:   Acute on chronic respiratory failure with hypoxia (HCC)   AKI (acute kidney injury) ([place])   Dysphagia   Hypertension   Diabetes mellitus without complication (HCC)   COPD (chronic obstructive pulmonary disease) (HCC)   Lung nodule    Assessment and Plan: * Multifocal pneumonia Pneumonia with acute on chronic respiratory failure.  Rules out for sepsis.  Tmax 99.6.  WBC 10.9.  Heart rate 93-107.  Not tachypneic.  ABG shows pH of 7.4, PCO2 of 42.  Chronic dysphagia.  CT confirms multifocal dependent consolidations, possibly aspiration.  No PE.  Recent hospitalization at [facility] for same. -Monitor respiration closely -Currently on high flow nasal cannula with sats 92 to 100% -Remain n.p.o. -Continue IV ceftriaxone and azithromycin  Acute on chronic respiratory failure with hypoxia (HCC) O2 sats down to 79% on 3 L.  Currently on high flow nasal cannula sats 92 to 100%.  ABG shows pH of 7.4, PCO2 of 42, PO2 of 58.  Likely secondary  to multifocal pneumonia, possible component of COPD exacerbation.  AKI (acute kidney injury) ([place]) Creatinine elevated 1.4.  Baseline 0.7-0.8.  Has 2+ pitting edema bilateral lower extremity, this is chronic. -Monitor creatinine for now, contrast exposure for CTA - May benefit from a dose of Lasix prior to discharge  Dysphagia Last EGD 04/2016 by Dr. Oneida Alar - no endoscopic esophageal abnormality to explain patient's dysphagia. Esophagus dilated. -Will consult GI.   -Mild hemoglobin drop to 8, baseline about 10.  Trend for now.  No hematemesis hematochezia or melena.  Lung nodule Incidental finding - the 1 cm nodule in the anterior right upper lobe appears subsolid on today's exam. As previously noted, bronchogenic carcinoma cannot be excluded. Recommend attention on follow-up imaging. -Follow-up as outpatient  COPD (chronic obstructive pulmonary disease) (HCC) Possible component of COPD exacerbation.  Diffuse rattling in chest.  Likely from secretions. -DuoNebs as needed and scheduled -Hold off on steroids for now  Diabetes mellitus without complication ([place]) Controlled.  A1c 6.1. - SSI- S q6h -Hold metformin, Januvia, Lantus 20 units twice daily, glimepiride  Hypertension Stable. -N.p.o. for now, hold losartan/HCTZ, metoprolol 50 twice daily -As needed labetalol IV 10 mg   DVT prophylaxis: heparin Code Status: FULL code Family Communication: Son- Michaela Moon at bedside, is also primary caregiver. Disposition Plan: > 2 days Consults called: None  Admission status: Inpt Stepdown I certify that at the point of admission it is my clinical judgment that the patient will require inpatient hospital care spanning beyond 2 midnights from the point of admission due to high intensity of service, high risk for further deterioration and high frequency of surveillance required.    Author: Bethena Roys, MD 11/28/2021 6:58 PM  For on call review [email or web address].

## 2021-11-28 NOTE — Assessment & Plan Note (Addendum)
Last EGD 04/2016 by Dr. Oneida Alar - no endoscopic esophageal abnormality to explain patient's dysphagia. Esophagus dilated. -Will consult GI.   -Mild hemoglobin drop to 8, baseline about 10.  Trend for now.  No hematemesis hematochezia or melena.

## 2021-11-28 NOTE — Telephone Encounter (Signed)
[name] returned call.  Made him aware that message would be sent to Dr. Halford Chessman.

## 2021-11-28 NOTE — Progress Notes (Signed)
RT placed patient on Heated High Flow nasal cannula. RT placed patient on 100% FIO2 with 35L flow. Patient appears to be tolerating well. SATs 92% HR 93, RR 14 and BBS coarse crackles. RT will continue to monitor.

## 2021-11-28 NOTE — Assessment & Plan Note (Addendum)
Pneumonia with acute on chronic respiratory failure.  Rules out for sepsis.  Tmax 99.6.  WBC 10.9.  Heart rate 93-107.  Not tachypneic.  ABG shows pH of 7.4, PCO2 of 42.  Chronic dysphagia.  CT confirms multifocal dependent consolidations, possibly aspiration.  No PE.  Recent hospitalization at [facility] for same. -Monitor respiration closely -Currently on high flow nasal cannula with sats 92 to 100% -Remain n.p.o. -Continue IV ceftriaxone and azithromycin

## 2021-11-28 NOTE — Assessment & Plan Note (Signed)
Possible component of COPD exacerbation.  Diffuse rattling in chest.  Likely from secretions. -DuoNebs as needed and scheduled -Hold off on steroids for now

## 2021-11-28 NOTE — Progress Notes (Signed)
Hypoglycemic Event  CBG: 63  Treatment: D50 25 mL (12.5 gm)  Symptoms: None  Follow-up CBG: Time: 1947 CBG Result:119  Possible Reasons for Event: NPO      Eric Form

## 2021-11-29 ENCOUNTER — Inpatient Hospital Stay (HOSPITAL_COMMUNITY): Payer: Medicare Other

## 2021-11-29 DIAGNOSIS — J9601 Acute respiratory failure with hypoxia: Secondary | ICD-10-CM | POA: Diagnosis not present

## 2021-11-29 DIAGNOSIS — J189 Pneumonia, unspecified organism: Secondary | ICD-10-CM | POA: Diagnosis not present

## 2021-11-29 DIAGNOSIS — L8990 Pressure ulcer of unspecified site, unspecified stage: Secondary | ICD-10-CM | POA: Insufficient documentation

## 2021-11-29 LAB — BASIC METABOLIC PANEL
Anion gap: 11 (ref 5–15)
BUN: 18 mg/dL (ref 8–23)
CO2: 25 mmol/L (ref 22–32)
Calcium: 9.4 mg/dL (ref 8.9–10.3)
Chloride: 106 mmol/L (ref 98–111)
Creatinine, Ser: 0.87 mg/dL (ref 0.44–1.00)
GFR, Estimated: >60 mL/min (ref >60)
Glucose, Bld: 96 mg/dL (ref 70–99)
Potassium: 3.7 mmol/L (ref 3.5–5.1)
Sodium: 142 mmol/L (ref 135–145)

## 2021-11-29 LAB — GLUCOSE, CAPILLARY
Glucose-Capillary: 100 mg/dL — ABNORMAL HIGH (ref 70–99)
Glucose-Capillary: 138 mg/dL — ABNORMAL HIGH (ref 70–99)
Glucose-Capillary: 142 mg/dL — ABNORMAL HIGH (ref 70–99)
Glucose-Capillary: 91 mg/dL (ref 70–99)
Glucose-Capillary: 94 mg/dL (ref 70–99)
Glucose-Capillary: 99 mg/dL (ref 70–99)

## 2021-11-29 LAB — CBC
HCT: 29 % — ABNORMAL LOW (ref 36.0–46.0)
Hemoglobin: 8.4 g/dL — ABNORMAL LOW (ref 12.0–15.0)
MCH: 22.8 pg — ABNORMAL LOW (ref 26.0–34.0)
MCHC: 29 g/dL — ABNORMAL LOW (ref 30.0–36.0)
MCV: 78.6 fL — ABNORMAL LOW (ref 80.0–100.0)
Platelets: 299 10*3/uL (ref 150–400)
RBC: 3.69 MIL/uL — ABNORMAL LOW (ref 3.87–5.11)
RDW: 22.7 % — ABNORMAL HIGH (ref 11.5–15.5)
WBC: 10.2 10*3/uL (ref 4.0–10.5)
nRBC: 0.4 % — ABNORMAL HIGH (ref 0.0–0.2)

## 2021-11-29 LAB — MRSA NEXT GEN BY PCR, NASAL: MRSA by PCR Next Gen: NOT DETECTED

## 2021-11-29 MED ORDER — FUROSEMIDE 10 MG/ML IJ SOLN
40.0000 mg | Freq: Once | INTRAMUSCULAR | Status: AC
  Administered 2021-11-29: 40 mg via INTRAVENOUS
  Filled 2021-11-29: qty 4

## 2021-11-29 MED ORDER — ARFORMOTEROL TARTRATE 15 MCG/2ML IN NEBU
15.0000 ug | INHALATION_SOLUTION | Freq: Two times a day (BID) | RESPIRATORY_TRACT | Status: DC
  Administered 2021-11-29 – 2021-12-04 (×10): 15 ug via RESPIRATORY_TRACT
  Filled 2021-11-29 (×10): qty 2

## 2021-11-29 MED ORDER — POLYVINYL ALCOHOL 1.4 % OP SOLN
1.0000 [drp] | OPHTHALMIC | Status: DC | PRN
  Administered 2021-11-29: 1 [drp] via OPHTHALMIC
  Filled 2021-11-29: qty 15

## 2021-11-29 MED ORDER — BUDESONIDE 0.5 MG/2ML IN SUSP
0.5000 mg | Freq: Two times a day (BID) | RESPIRATORY_TRACT | Status: DC
  Administered 2021-11-29 – 2021-12-04 (×10): 0.5 mg via RESPIRATORY_TRACT
  Filled 2021-11-29 (×10): qty 2

## 2021-11-29 MED ORDER — ALBUTEROL SULFATE (2.5 MG/3ML) 0.083% IN NEBU: 2.5000 mg | INHALATION_SOLUTION | RESPIRATORY_TRACT | Status: DC | PRN

## 2021-11-29 MED ORDER — IPRATROPIUM-ALBUTEROL 0.5-2.5 (3) MG/3ML IN SOLN
3.0000 mL | RESPIRATORY_TRACT | Status: DC
  Administered 2021-11-29: 3 mL via RESPIRATORY_TRACT

## 2021-11-29 MED ORDER — ALBUTEROL SULFATE (2.5 MG/3ML) 0.083% IN NEBU: 2.5000 mg | INHALATION_SOLUTION | RESPIRATORY_TRACT | Status: DC

## 2021-11-29 MED ORDER — GUAIFENESIN ER 600 MG PO TB12
1200.0000 mg | ORAL_TABLET | Freq: Two times a day (BID) | ORAL | Status: DC
  Administered 2021-11-29 – 2021-12-04 (×11): 1200 mg via ORAL
  Filled 2021-11-29 (×11): qty 2

## 2021-11-29 MED ORDER — HYDROCORTISONE 1 % EX CREA: TOPICAL_CREAM | Freq: Two times a day (BID) | CUTANEOUS | Status: DC | PRN

## 2021-11-29 MED ORDER — REVEFENACIN 175 MCG/3ML IN SOLN
175.0000 ug | Freq: Every day | RESPIRATORY_TRACT | Status: DC
  Administered 2021-11-29 – 2021-12-04 (×5): 175 ug via RESPIRATORY_TRACT
  Filled 2021-11-29 (×5): qty 3

## 2021-11-29 MED ORDER — PANTOPRAZOLE SODIUM 40 MG PO TBEC
40.0000 mg | DELAYED_RELEASE_TABLET | Freq: Every day | ORAL | Status: DC
  Administered 2021-11-29 – 2021-12-04 (×6): 40 mg via ORAL
  Filled 2021-11-29 (×6): qty 1

## 2021-11-29 NOTE — TOC Progression Note (Signed)
  Transition of Care [facility]) Screening Note   Patient Details  Name: Michaela Moon Date of Birth: Nov 28, 1935   Transition of Care [facility]) CM/SW Contact:    Boneta Lucks, RN Phone Number: 11/29/2021, 1:11 PM  Recent COVID on home oxygen at 2L  Transition of Care Department [facility]) has reviewed patient and no TOC needs have been identified at this time. We will continue to monitor patient advancement through interdisciplinary progression rounds. If new patient transition needs arise, please place a TOC consult.       Barriers to Discharge: Continued Medical Work up

## 2021-11-29 NOTE — Progress Notes (Signed)
[facility] changes decreased fio2 to 90% decreased to 30 L nurse informed

## 2021-11-29 NOTE — Progress Notes (Signed)
PROGRESS NOTE    Michaela Moon  FMB:[id number] DOB: [birth year] DOA: 11/28/2021 PCP: [name], MD   Brief Narrative:    Michaela Moon is a 86 y.o. female with medical history significant for COPD, diabetes mellitus, hypertension, dysphagia requiring dilation.  Patient was brought to the ED with low oxygen level, sats in the 80s on home 2 L.  She was admitted with acute on chronic hypoxemic respiratory failure secondary to multifocal pneumonia and has been started on IV Rocephin and azithromycin.  Pulmonology consultation pending.  Assessment & Plan:   Principal Problem:   Multifocal pneumonia Active Problems:   Acute on chronic respiratory failure with hypoxia (HCC)   AKI (acute kidney injury) ([place])   Dysphagia   Hypertension   Diabetes mellitus without complication (HCC)   COPD (chronic obstructive pulmonary disease) (HCC)   Lung nodule  Assessment and Plan:   Multifocal pneumonia Pneumonia with acute on chronic respiratory failure.  Rules out for sepsis.  Tmax 99.6.  WBC 10.9.  Heart rate 93-107.  Not tachypneic.  ABG shows pH of 7.4, PCO2 of 42.  Chronic dysphagia.  CT confirms multifocal dependent consolidations, possibly aspiration.  No PE.  Recent hospitalization at [facility] for same. -Monitor respiration closely -Currently on high flow nasal cannula with sats 92 to 100% -Remain n.p.o. -Continue IV ceftriaxone and azithromycin -Appreciate PCCM evaluation   Acute on chronic respiratory failure with hypoxia (HCC) O2 sats down to 79% on 3 L.  Currently on high flow nasal cannula sats 92 to 100%.  ABG shows pH of 7.4, PCO2 of 42, PO2 of 58.  Likely secondary to multifocal pneumonia, possible component of COPD exacerbation.   AKI (acute kidney injury) (HCC)-resolved Creatinine elevated 1.4.  Baseline 0.7-0.8.  Has 2+ pitting edema bilateral lower extremity, this is chronic. -New to monitor BMP   Dysphagia Last EGD 04/2016 by Dr. Oneida Alar - no endoscopic  esophageal abnormality to explain patient's dysphagia. Esophagus dilated. -Keep n.p.o. and appreciate SLP evaluation, cancel GI consultation for now   Lung nodule Incidental finding - the 1 cm nodule in the anterior right upper lobe appears subsolid on today's exam. As previously noted, bronchogenic carcinoma cannot be excluded. Recommend attention on follow-up imaging. -Follow-up as outpatient   COPD (chronic obstructive pulmonary disease) (HCC) Possible component of COPD exacerbation.  Diffuse rattling in chest.  Likely from secretions. -DuoNebs as needed and scheduled -Hold off on steroids for now   Diabetes mellitus without complication ([place]) Controlled.  A1c 6.1. - SSI- S q6h -Hold metformin, Januvia, Lantus 20 units twice daily, glimepiride   Hypertension Stable. -N.p.o. for now, hold losartan/HCTZ, metoprolol 50 twice daily -As needed labetalol IV 10 mg  Obesity -BMI 30.67    DVT prophylaxis:Heparin Code Status: Full Family Communication: None Disposition Plan:  Status is: Inpatient Remains inpatient appropriate because: High oxygen requirements and IV medications.  Skin Assessment:  I have examined the patient's skin and I agree with the wound assessment as performed by the wound care RN as outlined below:  Pressure Injury 11/28/21 Sacrum Bilateral Stage 1 -  Intact skin with non-blanchable redness of a localized area usually over a bony prominence. (Active)  11/28/21 1800  Location: Sacrum  Location Orientation: Bilateral  Staging: Stage 1 -  Intact skin with non-blanchable redness of a localized area usually over a bony prominence.  Wound Description (Comments):   Present on Admission: Yes  Dressing Type Foam - Lift dressing to assess site every shift 11/29/21 0830  Consultants:  PCCM  Procedures:  None  Antimicrobials:  Anti-infectives (From admission, onward)    Start     Dose/Rate Route Frequency Ordered Stop   11/29/21 1500  cefTRIAXone  (ROCEPHIN) 2 g in sodium chloride 0.9 % 100 mL IVPB        2 g 200 mL/hr over 30 Minutes Intravenous Every 24 hours 11/28/21 1858 12/04/21 1459   11/29/21 1500  azithromycin (ZITHROMAX) 500 mg in sodium chloride 0.9 % 250 mL IVPB        500 mg 250 mL/hr over 60 Minutes Intravenous Every 24 hours 11/28/21 1858 12/04/21 1459   11/28/21 1515  cefTRIAXone (ROCEPHIN) 2 g in sodium chloride 0.9 % 100 mL IVPB        2 g 200 mL/hr over 30 Minutes Intravenous  Once 11/28/21 1500 11/28/21 1623   11/28/21 1515  azithromycin (ZITHROMAX) tablet 500 mg        500 mg Oral  Once 11/28/21 1500 11/28/21 1548      Subjective: Patient seen and evaluated today with no new acute complaints or concerns. No acute concerns or events noted overnight.  She continues to have some rhonchi/rales bilaterally and remains on high flow nasal cannula oxygen.  Objective: Vitals:   11/29/21 1130 11/29/21 1200 11/29/21 1234 11/29/21 1239  BP:  (!) 131/46  (!) 131/46  Pulse:  95 97 90  Resp:  '17 19 19  '$ Temp: 98.2 F (36.8 C)     TempSrc: Oral     SpO2:  97% 94% 94%  Weight:      Height:        Intake/Output Summary (Last 24 hours) at 11/29/2021 1248 Last data filed at 11/29/2021 0747 Gross per 24 hour  Intake 600 ml  Output 1650 ml  Net -1050 ml   Filed Weights   11/28/21 1215 11/28/21 1757  Weight: 81.7 kg 83.6 kg    Examination:  General exam: Appears calm and comfortable  Respiratory system: Crackles/rales bilaterally with no wheezing.  Heated high flow nasal cannula oxygen. Cardiovascular system: S1 & S2 heard, RRR.  Gastrointestinal system: Abdomen is soft Central nervous system: Alert and awake Extremities: No edema Skin: No significant lesions noted Psychiatry: Flat affect.    Data Reviewed: I have personally reviewed following labs and imaging studies  CBC: Recent Labs  Lab 11/28/21 1236 11/29/21 0251  WBC 10.9* 10.2  HGB 8.4* 8.4*  HCT 28.3* 29.0*  MCV 78.6* 78.6*  PLT 304 222    Basic Metabolic Panel: Recent Labs  Lab 11/28/21 1236 11/29/21 0251  NA 140 142  K 3.9 3.7  CL 106 106  CO2 25 25  GLUCOSE 106* 96  BUN 27* 18  CREATININE 1.40* 0.87  CALCIUM 9.2 9.4   GFR: Estimated Creatinine Clearance: 51.4 mL/min (by C-G formula based on SCr of 0.87 mg/dL). Liver Function Tests: Recent Labs  Lab 11/28/21 1236  AST 17  ALT 14  ALKPHOS 38  BILITOT 0.5  PROT 6.8  ALBUMIN 2.8*   No results for input(s): "LIPASE", "AMYLASE" in the last 168 hours. No results for input(s): "AMMONIA" in the last 168 hours. Coagulation Profile: No results for input(s): "INR", "PROTIME" in the last 168 hours. Cardiac Enzymes: No results for input(s): "CKTOTAL", "CKMB", "CKMBINDEX", "TROPONINI" in the last 168 hours. BNP (last 3 results) No results for input(s): "PROBNP" in the last 8760 hours. HbA1C: No results for input(s): "HGBA1C" in the last 72 hours. CBG: Recent Labs  Lab 11/28/21 1947 11/28/21  2339 11/29/21 0443 11/29/21 0728 11/29/21 1114  GLUCAP 119* 77 91 100* 99   Lipid Profile: No results for input(s): "CHOL", "HDL", "LDLCALC", "TRIG", "CHOLHDL", "LDLDIRECT" in the last 72 hours. Thyroid Function Tests: No results for input(s): "TSH", "T4TOTAL", "FREET4", "T3FREE", "THYROIDAB" in the last 72 hours. Anemia Panel: No results for input(s): "VITAMINB12", "FOLATE", "FERRITIN", "TIBC", "IRON", "RETICCTPCT" in the last 72 hours. Sepsis Labs: No results for input(s): "PROCALCITON", "LATICACIDVEN" in the last 168 hours.  Recent Results (from the past 240 hour(s))  Resp Panel by RT-PCR (Flu A&B, Covid) Anterior Nasal Swab     Status: None   Collection Time: 11/28/21  1:00 PM   Specimen: Anterior Nasal Swab  Result Value Ref Range Status   SARS Coronavirus 2 by RT PCR NEGATIVE NEGATIVE Final    Comment: (NOTE) SARS-CoV-2 target nucleic acids are NOT DETECTED.  The SARS-CoV-2 RNA is generally detectable in upper respiratory specimens during the acute  phase of infection. The lowest concentration of SARS-CoV-2 viral copies this assay can detect is 138 copies/mL. A negative result does not preclude SARS-Cov-2 infection and should not be used as the sole basis for treatment or other patient management decisions. A negative result may occur with  improper specimen collection/handling, submission of specimen other than nasopharyngeal swab, presence of viral mutation(s) within the areas targeted by this assay, and inadequate number of viral copies(<138 copies/mL). A negative result must be combined with clinical observations, patient history, and epidemiological information. The expected result is Negative.  Fact Sheet for Patients:  EntrepreneurPulse.com.au  Fact Sheet for Healthcare Providers:  IncredibleEmployment.be  This test is no t yet approved or cleared by the [place] FDA and  has been authorized for detection and/or diagnosis of SARS-CoV-2 by FDA under an Emergency Use Authorization (EUA). This EUA will remain  in effect (meaning this test can be used) for the duration of the COVID-19 declaration under Section 564(b)(1) of the Act, 21 U.S.C.section 360bbb-3(b)(1), unless the authorization is terminated  or revoked sooner.       Influenza A by PCR NEGATIVE NEGATIVE Final   Influenza B by PCR NEGATIVE NEGATIVE Final    Comment: (NOTE) The Xpert Xpress SARS-CoV-2/FLU/RSV plus assay is intended as an aid in the diagnosis of influenza from Nasopharyngeal swab specimens and should not be used as a sole basis for treatment. Nasal washings and aspirates are unacceptable for Xpert Xpress SARS-CoV-2/FLU/RSV testing.  Fact Sheet for Patients: EntrepreneurPulse.com.au  Fact Sheet for Healthcare Providers: IncredibleEmployment.be  This test is not yet approved or cleared by the [place] FDA and has been authorized for detection and/or diagnosis of  SARS-CoV-2 by FDA under an Emergency Use Authorization (EUA). This EUA will remain in effect (meaning this test can be used) for the duration of the COVID-19 declaration under Section 564(b)(1) of the Act, 21 U.S.C. section 360bbb-3(b)(1), unless the authorization is terminated or revoked.  Performed at [facility], [address], [place], [place] [address]   MRSA Next Gen by PCR, Nasal     Status: None   Collection Time: 11/28/21  5:58 PM   Specimen: Nasal Mucosa; Nasal Swab  Result Value Ref Range Status   MRSA by PCR Next Gen NOT DETECTED NOT DETECTED Final    Comment: (NOTE) The GeneXpert MRSA Assay (FDA approved for NASAL specimens only), is one component of a comprehensive MRSA colonization surveillance program. It is not intended to diagnose MRSA infection nor to guide or monitor treatment for MRSA infections. Test performance is  not FDA approved in patients less than 64 years old. Performed at [facility], [address] [place], [place] [address]          Radiology Studies: CT Angio Chest PE W and/or Wo Contrast  Result Date: 11/28/2021 CLINICAL DATA:  PE suspected.  Low O2 sats. EXAM: CT ANGIOGRAPHY CHEST WITH CONTRAST TECHNIQUE: Multidetector CT imaging of the chest was performed using the standard protocol during bolus administration of intravenous contrast. Multiplanar CT image reconstructions and MIPs were obtained to evaluate the vascular anatomy. RADIATION DOSE REDUCTION: This exam was performed according to the departmental dose-optimization program which includes automated exposure control, adjustment of the mA and/or kV according to patient size and/or use of iterative reconstruction technique. CONTRAST:  57m OMNIPAQUE IOHEXOL 350 MG/ML SOLN COMPARISON:  CT chest 09/20/2021 FINDINGS: Cardiovascular: Satisfactory opacification of the pulmonary arteries to the segmental level. No evidence of pulmonary embolism. Normal heart size. No pericardial effusion. Calcific  aortic atherosclerosis. Multivessel coronary artery calcification. Mediastinum/Nodes: No enlarged mediastinal, hilar, or axillary lymph nodes. Thyroid gland and trachea demonstrate no significant findings. Patulous esophagus. Lungs/Pleura: A 1.0 x 0.8 cm irregular nodule in the anterior right upper lobe appears subsolid on today's exam (series 6, image 57). Irregular consolidation with air bronchograms and surrounding ground-glass opacities dependently located in the left upper lobe is not significantly changed compared to 09/20/2021. Airspace consolidation with air bronchograms dependently located in the right upper and right middle lobes with adjacent ground-glass opacities are increased compared to 09/20/2021. Similarly, patchy consolidations with surrounding ground-glass opacities in the lower lungs are also increased compared to 09/16/2021, particularly in the right lower lobe. No pleural effusion or pneumothorax. Upper Abdomen: No acute abnormality. Benign left hepatic cyst again noted. No follow-up imaging indicated. Musculoskeletal: No chest wall abnormality. No acute or suspicious osseous findings. Multilevel degenerative changes in the thoracic and visualized upper lumbar spine. Review of the MIP images confirms the above findings. IMPRESSION: 1. No pulmonary embolism. 2. Multifocal, dependently located, consolidations bilaterally, favored to represent multifocal pneumonia, possibly aspiration related. Recommend follow-up CT imaging in 3 months after treatment to ensure resolution. 3. The 1 cm nodule in the anterior right upper lobe appears subsolid on today's exam. As previously noted, bronchogenic carcinoma cannot be excluded. Recommend attention on follow-up imaging. 4. Multivessel coronary artery disease. Aortic Atherosclerosis (ICD10-I70.0). Electronically Signed   By: LIleana RoupM.D.   On: 11/28/2021 16:15   DG Chest Port 1 View  Result Date: 11/28/2021 CLINICAL DATA:  Dyspnea, shaking,  hypoxia, history COPD, asthma, diabetes mellitus, hypertension EXAM: PORTABLE CHEST 1 VIEW COMPARISON:  Portable exam 1304 hours compared to 10/31/2021 FINDINGS: Upper normal size of cardiac silhouette. Mediastinal contours and pulmonary vascularity normal. Atherosclerotic calcification aorta. Bibasilar infiltrates question multifocal pneumonia. Minimal pleural effusions. No pneumothorax. Bones demineralized with posttraumatic deformity proximal LEFT humerus and multilevel degenerative disc disease changes/scoliosis thoracic spine. IMPRESSION: Bibasilar pulmonary infiltrates question multifocal pneumonia. Minimal pleural effusions. Aortic Atherosclerosis (ICD10-I70.0). Electronically Signed   By: MLavonia DanaM.D.   On: 11/28/2021 13:15        Scheduled Meds:  albuterol  2.5 mg Nebulization Q4H   Chlorhexidine Gluconate Cloth  6 each Topical Q0600   fluticasone furoate-vilanterol  1 puff Inhalation Daily   And   umeclidinium bromide  1 puff Inhalation Daily   heparin  5,000 Units Subcutaneous Q8H   insulin aspart  0-9 Units Subcutaneous Q4H   Continuous Infusions:  azithromycin     cefTRIAXone (ROCEPHIN)  IV  LOS: 1 day    Time spent: 35 minutes    Amna Welker Darleen Crocker, DO [facility]  If 7PM-7AM, please contact night-coverage [email or web address] 11/29/2021, 12:48 PM

## 2021-11-29 NOTE — Evaluation (Signed)
Clinical/Bedside Swallow Evaluation Patient Details  Name: Michaela Moon MRN: [id number] Date of Birth: 1935/08/27  Today's Date: 11/29/2021 Time: SLP Start Time (ACUTE ONLY): 54 SLP Stop Time (ACUTE ONLY): 4268 SLP Time Calculation (min) (ACUTE ONLY): 26 min  Past Medical History:  Past Medical History:  Diagnosis Date   Arthritis    Asthma    COPD (chronic obstructive pulmonary disease) ([place])    Diabetes mellitus without complication (HCC)    GERD (gastroesophageal reflux disease)    Headache    Hyperlipidemia    Hypertension    Macular degeneration    Neuropathy    Obesity    Osteopenia    Stress incontinence    Past Surgical History:  Past Surgical History:  Procedure Laterality Date   BREAST LUMPECTOMY Bilateral    no cancer   CARPAL TUNNEL RELEASE Left 10/23/2017   Procedure: LEFT CARPAL TUNNEL RELEASE;  Surgeon: Carole Civil, MD;  Location: [facility];  Service: Orthopedics;  Laterality: Left;   CARPAL TUNNEL RELEASE Right 11/20/2017   Procedure: CARPAL TUNNEL RELEASE;  Surgeon: Carole Civil, MD;  Location: [facility];  Service: Orthopedics;  Laterality: Right;   CATARACT EXTRACTION W/PHACO Left 11/01/2016   Procedure: CATARACT EXTRACTION PHACO AND INTRAOCULAR LENS PLACEMENT (IOC);  Surgeon: Baruch Goldmann, MD;  Location: [facility];  Service: Ophthalmology;  Laterality: Left;  CDE: 3.83   CATARACT EXTRACTION W/PHACO Right 11/29/2016   Procedure: CATARACT EXTRACTION PHACO AND INTRAOCULAR LENS PLACEMENT RIGHT EYE;  Surgeon: Baruch Goldmann, MD;  Location: [facility];  Service: Ophthalmology;  Laterality: Right;  CDE: 8.71   colon tumor removed  2010   Dr. 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 submucosal lipoma on path   COLONOSCOPY  05/2013   Dr. Anthony Sar: normal. (h/o colon polyps)   COLONOSCOPY  2011   normal   COLONOSCOPY  10/2003   Dr. Lindalou Hose: large polypoid mass hepatic fluexure   ESOPHAGOGASTRODUODENOSCOPY N/A 04/29/2016    Procedure: ESOPHAGOGASTRODUODENOSCOPY (EGD);  Surgeon: Danie Binder, MD;  Location: [facility];  Service: Endoscopy;  Laterality: N/A;  9:15am   SAVORY DILATION N/A 04/29/2016   Procedure: SAVORY DILATION;  Surgeon: Danie Binder, MD;  Location: [facility];  Service: Endoscopy;  Laterality: N/A;   HPI:  Michaela Moon is a 86 y.o. female with medical history significant for COPD, diabetes mellitus, hypertension, dysphagia requiring dilation.  Patient was brought to the ED with low oxygen level, sats in the 80s on home 2 L.     Patient was recently admitted at [facility]- admitted 9/2 to 9/12 for acute respiratory failure, required BiPAP.  She was positive for COVID and treated with antibiotics and Paxlovid for multifocal pneumonia.  Also given a dose of diuretic. Was discharged home on new oxygen requirement of 2 L, and to complete course of antibiotics. CTA chest negative for PE, shows multifocal dependent located consolidations bilaterally favoring multifocal pneumonia possibly aspiration related. BSE requested.    Assessment / Plan / Recommendation  Clinical Impression  Clinical swallow evaluation completed at bedside with [name] present who also provided additional background information. Pt reports that she had esophageal dilation with Dr Oneida Alar in 2018 and noted relief in symptoms (improved ability to swallow solid foods). Pt and son report that she has to "watch" what she eats at home because some solid foods feel like they get "stuck" and she points to her chest. She also reports early satiety, but denies regurgitation  or reflux symptoms. Oral motor examination is WNL; Pt with U/L dentures in place. Pt able to self present PO and showed not overt signs or symptoms of aspiration and no reports of pharyngeal globus. Pt has been on oxygen since she was discharged from [facility] after admission for COVID PNA. She was not on O2 prior to that hospitalization, but was followed by  pulmonary as an outpatient. Recommend D3/mech soft and thin liquids with PO Medications whole with water or with puree and SLP to follow up during acute stay with possible MBSS if schedule can accommodate it tomorrow (or can be completed as an outpatient) given history of PNA, Pt/family reports of dysphagia to solid foods, and compromised respiratory status. Alternatively, Pt may benefit from a dedicated esophageal assessment via barium pill esophagram given subjective reports of early satiety and difficulty swallowing solid foods. Pt may have esophageal dysmotility negatively impacting efficient PO intake. SLP discussed with Dr. Halford Chessman and will try for MBSS. Pt has not followed up with GI since she was seen in 2018 and may benefit from follow up. SLP will follow. SLP Visit Diagnosis: Dysphagia, unspecified (R13.10)    Aspiration Risk  Mild aspiration risk    Diet Recommendation Dysphagia 3 (Mech soft);Thin liquid   Liquid Administration via: Cup;Straw Medication Administration: Whole meds with liquid Supervision: Patient able to self feed;Intermittent supervision to cue for compensatory strategies Compensations: Slow rate Postural Changes: Seated upright at 90 degrees;Remain upright for at least 30 minutes after po intake    Other  Recommendations Recommended Consults: Consider esophageal assessment;Consider GI evaluation Oral Care Recommendations: Oral care BID;Staff/trained caregiver to provide oral care Other Recommendations: Clarify dietary restrictions    Recommendations for follow up therapy are one component of a multi-disciplinary discharge planning process, led by the attending physician.  Recommendations may be updated based on patient status, additional functional criteria and insurance authorization.  Follow up Recommendations Follow physician's recommendations for discharge plan and follow up therapies      Assistance Recommended at Discharge Intermittent Supervision/Assistance   Functional Status Assessment Patient has had a recent decline in their functional status and demonstrates the ability to make significant improvements in function in a reasonable and predictable amount of time.  Frequency and Duration min 2x/week  1 week       Prognosis Prognosis for Safe Diet Advancement: Good      Swallow Study   General Date of Onset: 11/28/21 HPI: Michaela Moon is a 86 y.o. female with medical history significant for COPD, diabetes mellitus, hypertension, dysphagia requiring dilation.  Patient was brought to the ED with low oxygen level, sats in the 80s on home 2 L.     Patient was recently admitted at [facility]- admitted 9/2 to 9/12 for acute respiratory failure, required BiPAP.  She was positive for COVID and treated with antibiotics and Paxlovid for multifocal pneumonia.  Also given a dose of diuretic. Was discharged home on new oxygen requirement of 2 L, and to complete course of antibiotics. CTA chest negative for PE, shows multifocal dependent located consolidations bilaterally favoring multifocal pneumonia possibly aspiration related. BSE requested. Type of Study: Bedside Swallow Evaluation Previous Swallow Assessment: EGD in 2018 with dilation Diet Prior to this Study: NPO Temperature Spikes Noted: No Respiratory Status: Nasal cannula History of Recent Intubation: No Behavior/Cognition: Alert;Cooperative;Pleasant mood Oral Cavity Assessment: Within Functional Limits (lingual coating) Oral Care Completed by SLP: Yes Oral Cavity - Dentition: Dentures, top;Dentures, bottom Vision: Functional for self-feeding Self-Feeding Abilities: Able  to feed self Patient Positioning: Upright in bed Baseline Vocal Quality: Normal Volitional Cough: Weak;Congested Volitional Swallow: Able to elicit    Oral/Motor/Sensory Function Overall Oral Motor/Sensory Function: Within functional limits   Ice Chips Ice chips: Within functional limits Presentation: Spoon   Thin  Liquid Thin Liquid: Within functional limits Presentation: Cup;Self Fed;Straw    Nectar Thick Nectar Thick Liquid: Not tested   Honey Thick Honey Thick Liquid: Not tested   Puree Puree: Within functional limits Presentation: Self Fed;Spoon   Solid     Solid: Impaired Presentation: Self Fed Oral Phase Functional Implications:  (lingual coating, cleared with liquid wash)     Thank you,  Genene Churn, [place]  Tersea Aulds 11/29/2021,1:35 PM

## 2021-11-29 NOTE — Consult Note (Signed)
[facility]   Patient name: Michaela Moon Admit date: 11/28/2021  DOB: October 05, 1935 LOS: 1  MRN: [id number] Consult date: 11/29/2021  Referring provider: Dr. Manuella Ghazi, [facility] CC: Hypoxia    History:  86 yo female former smoker followed in pulmonary office for chronic cough and recurrent pneumonia was at [facility] in September 2023 for COVID pneumonia.  She was sent home with 3 liters oxygen.  At home she was noted to have more shaking and SpO2 was 85% on 8 liters, so she was brought to [facility] on 11/28/21.  Chest xray and CT chest showed bilateral infiltrates.  She was started on antibiotics for pneumonia.  Pulmonary consulted to assist with respiratory management.  Past medical history:  OA, DM type 2, GERD, Headaches, HLD, HTN, Macular degeneration, Neuropathy, Osteopenia, Stress incontinence, COVID pneumonia September 2023  Significant events:  10/04 Admit  Studies:  CT angio chest 11/28/21 >> 1 cm nodule RUL, irregular consolidation with air bronchograms and GGO LUL and patchy GGO with consolidation in lower lobes no change since July 2023, consolidation RML increased since July 2023  Micro:  COVID/Flu 10/4 >> negative  Lines:     Antibiotics:  Rocephin 10/4 >> Zithromax 10/4 >>  Consults:  Speech    Interim history:  Has more cough and congestion.  Breathing feels comfortable otherwise.  Not having fever, chest pain, nausea, or hemoptysis.  Vital signs:  BP 108/87   Pulse 98   Temp 98.2 F (36.8 C) (Oral)   Resp (!) 24   Ht '5\' 5"'$  (1.651 m)   Wt 83.6 kg   SpO2 90%   BMI 30.67 kg/m   Intake/output:  I/O last 3 completed shifts: In: 600 [IV Piggyback:600] Out: 950 [Urine:950]   Physical exam:   General - alert Eyes - pupils reactive ENT - no sinus tenderness, raspy voice Cardiac - regular rate/rhythm, no murmur Chest - bilateral crackles Abdomen - soft, non tender, + bowel sounds Extremities - no cyanosis,  clubbing, or edema Skin - no rashes Neuro - normal strength, moves extremities, follows commands Psych - normal mood and behavior  Best practice:   DVT - SQ heparin SUP - protonix Nutrition - D3   Assessment/plan:   Acute hypoxic respiratory failure. - from recurrent pneumonia in setting of recent COVID 19 infection - oxygen to keep SpO2 88 to 95% - f/u CXR intermittently - bronchial hygiene - continue ABx  Chronic bronchitis. - change to yupelri, brovana, pulmicort - prn albuterol  Lung nodule. - f/u imaging as outpt  Dysphagia and reflux with concern for recurrent aspiration pneumonitis. - follow up with speech therapy - add protonix  Pressure injury. - stage 1 sacrum, POA - wound care  Anemia, NOS. - check iron levels  DM type 2. HTN. - per primary team  Resolved hospital problems:    Goals of care/Family discussions:  Code status: full  Updated her son at bedside  Labs:      Latest Ref Rng & Units 11/29/2021    2:51 AM 11/28/2021   12:36 PM 10/19/2020   11:32 AM  CMP  Glucose 70 - 99 mg/dL 96  106  111   BUN 8 - 23 mg/dL '18  27  12   '$ Creatinine 0.44 - 1.00 mg/dL 0.87  1.40  0.83   Sodium 135 - 145 mmol/L 142  140  139   Potassium 3.5 - 5.1 mmol/L 3.7  3.9  4.2   Chloride 98 - 111  mmol/L 106  106  102   CO2 22 - 32 mmol/L '25  25  23   '$ Calcium 8.9 - 10.3 mg/dL 9.4  9.2  10.5   Total Protein 6.5 - 8.1 g/dL  6.8  7.3   Total Bilirubin 0.3 - 1.2 mg/dL  0.5  0.3   Alkaline Phos 38 - 126 U/L  38  52   AST 15 - 41 U/L  17  23   ALT 0 - 44 U/L  14  21        Latest Ref Rng & Units 11/29/2021    2:51 AM 11/28/2021   12:36 PM 10/19/2020   11:32 AM  CBC  WBC 4.0 - 10.5 K/uL 10.2  10.9  6.6   Hemoglobin 12.0 - 15.0 g/dL 8.4  8.4  10.9   Hematocrit 36.0 - 46.0 % 29.0  28.3  34.5   Platelets 150 - 400 K/uL 299  304  188     ABG    Component Value Date/Time   PHART 7.41 11/28/2021 1310   PCO2ART 42 11/28/2021 1310   PO2ART 58 (L) 11/28/2021 1310    HCO3 26.6 11/28/2021 1310   O2SAT 86.9 11/28/2021 1310    CBG (last 3)  Recent Labs    11/29/21 0443 11/29/21 0728 11/29/21 1114  GLUCAP 91 100* 99     Past surgical history:  She  has a past surgical history that includes Colonoscopy (05/2013); colon tumor removed (2010); Colonoscopy (2011); Colonoscopy (10/2003); Esophagogastroduodenoscopy (N/A, 04/29/2016); Savory dilation (N/A, 04/29/2016); Cataract extraction w/PHACO (Left, 11/01/2016); Cataract extraction w/PHACO (Right, 11/29/2016); Breast lumpectomy (Bilateral); Carpal tunnel release (Left, 10/23/2017); and Carpal tunnel release (Right, 11/20/2017).  Social history:  She  reports that she quit smoking about 19 years ago. Her smoking use included cigarettes. She has a 40.00 pack-year smoking history. She has never used smokeless tobacco. She reports that she does not drink alcohol and does not use drugs.   Review of systems:  Reviewed and negative  Family history:  Her family history includes Melanoma in her brother.    Medications:   No current facility-administered medications on file prior to encounter.   Current Outpatient Medications on File Prior to Encounter  Medication Sig   albuterol (PROVENTIL) (2.5 MG/3ML) 0.083% nebulizer solution Take 3 mLs (2.5 mg total) by nebulization every 6 (six) hours as needed for wheezing or shortness of breath.   albuterol-ipratropium (COMBIVENT) 18-103 MCG/ACT inhaler Inhale 2 puffs into the lungs every 6 (six) hours as needed for wheezing.   aspirin 81 MG tablet Take 81 mg by mouth daily.   atorvastatin (LIPITOR) 80 MG tablet ALTERNATE TAKING 1 TABLET EVERY OTHER DAY WITH 1/2 TABLET EVERY OTHERDAY   Azelastine HCl (ASTEPRO) 0.15 % SOLN Place 1 spray into the nose at bedtime.   Biotin w/ Vitamins C & E (HAIR/SKIN/NAILS PO) Take 1 tablet by mouth daily. Takes once a day.   Budeson-Glycopyrrol-Formoterol (BREZTRI AEROSPHERE) 160-9-4.8 MCG/ACT AERO Inhale 2 puffs into the lungs in the morning  and at bedtime.   fluticasone (FLONASE) 50 MCG/ACT nasal spray Place 1 spray into both nostrils daily.   gabapentin (NEURONTIN) 400 MG capsule Take 400 mg by mouth 3 (three) times daily.   glimepiride (AMARYL) 2 MG tablet Take 4 mg by mouth daily with breakfast.    guaiFENesin (MUCINEX) 600 MG 12 hr tablet Take 2 tablets (1,200 mg total) by mouth 2 (two) times daily as needed for cough or to loosen phlegm.  Hypromellose (ARTIFICIAL TEARS OP) Place 1 drop into both eyes 4 (four) times daily.   ipratropium (ATROVENT) 0.03 % nasal spray Place 2 sprays into both nostrils every 12 (twelve) hours.   Ipratropium-Albuterol (COMBIVENT) 20-100 MCG/ACT AERS respimat Inhale 1 puff into the lungs every 6 (six) hours.   LANTUS SOLOSTAR 100 UNIT/ML Solostar Pen Inject 20 Units into the skin in the morning and at bedtime.   losartan-hydrochlorothiazide (HYZAAR) 100-12.5 MG per tablet TAKE ONE (1) TABLET EACH DAY (Patient taking differently: Take 1 tablet by mouth daily.)   metoprolol (LOPRESSOR) 50 MG tablet Take 50 mg by mouth 2 (two) times daily.   montelukast (SINGULAIR) 10 MG tablet TAKE ONE TABLET BY MOUTH AT BEDTIME   Omega-3 Fatty Acids (FISH OIL) 1000 MG CAPS Take 1,000 mg by mouth in the morning, at noon, and at bedtime.   oxybutynin (DITROPAN) 5 MG tablet Take 5 mg by mouth every 8 (eight) hours as needed for bladder spasms.    pantoprazole (PROTONIX) 40 MG tablet Take 40 mg by mouth daily.   sitaGLIPtin-metformin (JANUMET) 50-1000 MG tablet Take 1 tablet by mouth 2 (two) times daily with a meal.   triamcinolone cream (KENALOG) 0.1 % Apply 1 application topically daily as needed (eczema).     Signature:  Chesley Mires, MD [place] Pager - [phone] 11/29/2021, 2:04 PM

## 2021-11-30 DIAGNOSIS — J189 Pneumonia, unspecified organism: Secondary | ICD-10-CM | POA: Diagnosis not present

## 2021-11-30 DIAGNOSIS — J9621 Acute and chronic respiratory failure with hypoxia: Secondary | ICD-10-CM | POA: Diagnosis not present

## 2021-11-30 LAB — BASIC METABOLIC PANEL
Anion gap: 9 (ref 5–15)
BUN: 15 mg/dL (ref 8–23)
CO2: 28 mmol/L (ref 22–32)
Calcium: 9.9 mg/dL (ref 8.9–10.3)
Chloride: 104 mmol/L (ref 98–111)
Creatinine, Ser: 0.73 mg/dL (ref 0.44–1.00)
GFR, Estimated: >60 mL/min (ref >60)
Glucose, Bld: 136 mg/dL — ABNORMAL HIGH (ref 70–99)
Potassium: 3.7 mmol/L (ref 3.5–5.1)
Sodium: 141 mmol/L (ref 135–145)

## 2021-11-30 LAB — GLUCOSE, CAPILLARY
Glucose-Capillary: 131 mg/dL — ABNORMAL HIGH (ref 70–99)
Glucose-Capillary: 152 mg/dL — ABNORMAL HIGH (ref 70–99)
Glucose-Capillary: 116 mg/dL — ABNORMAL HIGH (ref 70–99)
Glucose-Capillary: 124 mg/dL — ABNORMAL HIGH (ref 70–99)
Glucose-Capillary: 171 mg/dL — ABNORMAL HIGH (ref 70–99)

## 2021-11-30 LAB — IRON AND TIBC
Iron: 16 ug/dL — ABNORMAL LOW (ref 28–170)
Saturation Ratios: 6 % — ABNORMAL LOW (ref 10.4–31.8)
TIBC: 288 ug/dL (ref 250–450)
UIBC: 272 ug/dL

## 2021-11-30 LAB — CBC
HCT: 26.6 % — ABNORMAL LOW (ref 36.0–46.0)
Hemoglobin: 8 g/dL — ABNORMAL LOW (ref 12.0–15.0)
MCH: 23.4 pg — ABNORMAL LOW (ref 26.0–34.0)
MCHC: 30.1 g/dL (ref 30.0–36.0)
MCV: 77.8 fL — ABNORMAL LOW (ref 80.0–100.0)
Platelets: 289 10*3/uL (ref 150–400)
RBC: 3.42 MIL/uL — ABNORMAL LOW (ref 3.87–5.11)
RDW: 22.2 % — ABNORMAL HIGH (ref 11.5–15.5)
WBC: 9.3 10*3/uL (ref 4.0–10.5)
nRBC: 0.2 % (ref 0.0–0.2)

## 2021-11-30 LAB — MAGNESIUM: Magnesium: 1.2 mg/dL — ABNORMAL LOW (ref 1.7–2.4)

## 2021-11-30 LAB — FERRITIN: Ferritin: 31 ng/mL (ref 11–307)

## 2021-11-30 MED ORDER — ORAL CARE MOUTH RINSE: 15.0000 mL | OROMUCOSAL | Status: DC | PRN

## 2021-11-30 MED ORDER — MAGNESIUM SULFATE 2 GM/50ML IV SOLN
2.0000 g | Freq: Once | INTRAVENOUS | Status: AC
  Administered 2021-11-30: 2 g via INTRAVENOUS
  Filled 2021-11-30: qty 50

## 2021-11-30 MED ORDER — ORAL CARE MOUTH RINSE
15.0000 mL | OROMUCOSAL | Status: DC
  Administered 2021-11-30 – 2021-12-01 (×4): 15 mL via OROMUCOSAL

## 2021-11-30 MED ORDER — SODIUM CHLORIDE 0.9 % IV SOLN
250.0000 mg | Freq: Every day | INTRAVENOUS | Status: AC
  Administered 2021-11-30 – 2021-12-01 (×2): 250 mg via INTRAVENOUS
  Filled 2021-11-30 (×2): qty 20

## 2021-11-30 MED ORDER — SODIUM CHLORIDE 0.9 % IV SOLN: INTRAVENOUS | Status: DC | PRN

## 2021-11-30 NOTE — Progress Notes (Signed)
Speech Language Pathology Treatment: Dysphagia  Patient Details Name: Michaela Moon MRN: [id number] DOB: Jul 16, 1935 Today's Date: 11/30/2021 Time: 1751-0258 SLP Time Calculation (min) (ACUTE ONLY): 19 min  Assessment / Plan / Recommendation Clinical Impression  ST had planned to completed MBSS today, however, Pt is currently requiring heated High-Flow at 25 L/mn at 60%. Per RN this amount of respiratory support cannot be provided during transfer and down in radiology for the procedure, therefore Pt's respiratory status is not stable enough to complete MBSS at this time.   SLP provided ongoing diagnostic dysphagia therapy; SLP set up and provided feeder support during lunch. Pt consumed thin liquids and mechanical soft textures initially without overt s/sx of oropharyngeal dypshagia, however note some wet vocal quality inconsistently audible during the meal. SLP provided cues and educated Pt that she should clear her throat and provide repeat/dry swallows throughout meal. Echo concern for esophageal dysmotility negatively impacting PO intake and suggest consider barium pill esophagram. Still agree that MBSS would be of benefit as Pt has many risk factors including hx of PNA, compromised respiratory status, deconditioned status and reports of dysphagia, further note we cannot r/o silent aspiration without an instrumental assessment. ST will attempt to complete MBSS on Monday if schedule permits and Pt's respiratory status is stable enough for transport down to radiology. Can be completed as OP if necessary. Recommend continue with D3/mech soft and thin liquids with meds whole with liquids with strategies re: clear throat and provide additional dry swallows throughout meals. Thank you, ST will continue to follow.   HPI HPI: Michaela Moon is a 86 y.o. female with medical history significant for COPD, diabetes mellitus, hypertension, dysphagia requiring dilation.  Patient was brought to the ED with low  oxygen level, sats in the 80s on home 2 L.     Patient was recently admitted at [facility]- admitted 9/2 to 9/12 for acute respiratory failure, required BiPAP.  She was positive for COVID and treated with antibiotics and Paxlovid for multifocal pneumonia.  Also given a dose of diuretic. Was discharged home on new oxygen requirement of 2 L, and to complete course of antibiotics. CTA chest negative for PE, shows multifocal dependent located consolidations bilaterally favoring multifocal pneumonia possibly aspiration related. BSE requested.      SLP Plan  Continue with current plan of care;MBS      Recommendations for follow up therapy are one component of a multi-disciplinary discharge planning process, led by the attending physician.  Recommendations may be updated based on patient status, additional functional criteria and insurance authorization.    Recommendations  Diet recommendations: Dysphagia 3 (mechanical soft);Thin liquid Liquids provided via: Cup;Straw Medication Administration: Whole meds with liquid Supervision: Patient able to self feed Compensations: Slow rate                Oral Care Recommendations: Oral care BID;Staff/trained caregiver to provide oral care Follow Up Recommendations: Follow physician's recommendations for discharge plan and follow up therapies Assistance recommended at discharge: Intermittent Supervision/Assistance SLP Visit Diagnosis: Dysphagia, unspecified (R13.10) Plan: Continue with current plan of care;MBS         Fina Heizer H. Roddie Mc, CCC-SLP Speech Language Pathologist   [name]  11/30/2021, 12:43 PM

## 2021-11-30 NOTE — Consult Note (Signed)
[facility]   Patient name: Michaela Moon Admit date: 11/28/2021  DOB: Dec 02, 1935 LOS: 2  MRN: [id number] Consult date: 11/29/2021  Referring provider: Dr. Manuella Ghazi, [facility] CC: Hypoxia    History:  86 yo female former smoker followed in pulmonary office for chronic cough and recurrent pneumonia was at [facility] in September 2023 for COVID pneumonia.  She was sent home with 3 liters oxygen.  At home she was noted to have more shaking and SpO2 was 85% on 8 liters, so she was brought to [facility] on 11/28/21.  Chest xray and CT chest showed bilateral infiltrates.  She was started on antibiotics for pneumonia.  Pulmonary consulted to assist with respiratory management.  Past medical history:  OA, DM type 2, GERD, Headaches, HLD, HTN, Macular degeneration, Neuropathy, Osteopenia, Stress incontinence, COVID pneumonia September 2023  Significant events:  10/04 Admit  Studies:  CT angio chest 11/28/21 >> 1 cm nodule RUL, irregular consolidation with air bronchograms and GGO LUL and patchy GGO with consolidation in lower lobes no change since July 2023, consolidation RML increased since July 2023  Micro:  COVID/Flu 10/4 >> negative  Lines:     Antibiotics:  Rocephin 10/4 >> Zithromax 10/4 >>  Consults:  Speech    Interim history:  Still has cough with clear sputum.    Vital signs:  BP 116/80 (BP Location: Left Arm)   Pulse (!) 113   Temp 97.6 F (36.4 C)   Resp 18   Ht '5\' 5"'$  (1.651 m)   Wt 81.6 kg   SpO2 94%   BMI 29.94 kg/m   Intake/output:  I/O last 3 completed shifts: In: 350.1 [IV Piggyback:350.1] Out: 2993 [Urine:3175]   Physical exam:   General - alert Eyes - pupils reactive ENT - no sinus tenderness, no stridor Cardiac - regular rate/rhythm, no murmur Chest - b/l crackles Abdomen - soft, non tender, + bowel sounds Extremities - no cyanosis, clubbing, or edema Skin - no rashes Neuro - normal strength, moves  extremities, follows commands Psych - normal mood and behavior  Best practice:   DVT - SQ heparin SUP - protonix Nutrition - D3   Assessment/plan:   Acute hypoxic respiratory failure. - from recurrent pneumonia in setting of recent COVID 19 infection - oxygen to keep SpO2 88 to 95% - f/u CXR intermittently - bronchial hygiene - continue ABx  Chronic bronchitis. - change to yupelri, brovana, pulmicort - prn albuterol  Lung nodule. - f/u imaging as outpt  Dysphagia and reflux with concern for recurrent aspiration pneumonitis. - follow up with speech therapy - continue protonix  Pressure injury. - stage 1 sacrum, POA - wound care  Iron deficiency anemia. - iron replacement per primary team  DM type 2. HTN. - per primary team  Resolved hospital problems:    Goals of care/Family discussions:  Code status: full  Updated her son at bedside  Labs:      Latest Ref Rng & Units 11/30/2021    3:35 AM 11/29/2021    2:51 AM 11/28/2021   12:36 PM  CMP  Glucose 70 - 99 mg/dL 136  96  106   BUN 8 - 23 mg/dL '15  18  27   '$ Creatinine 0.44 - 1.00 mg/dL 0.73  0.87  1.40   Sodium 135 - 145 mmol/L 141  142  140   Potassium 3.5 - 5.1 mmol/L 3.7  3.7  3.9   Chloride 98 - 111 mmol/L 104  106  106   CO2 22 - 32 mmol/L '28  25  25   '$ Calcium 8.9 - 10.3 mg/dL 9.9  9.4  9.2   Total Protein 6.5 - 8.1 g/dL   6.8   Total Bilirubin 0.3 - 1.2 mg/dL   0.5   Alkaline Phos 38 - 126 U/L   38   AST 15 - 41 U/L   17   ALT 0 - 44 U/L   14        Latest Ref Rng & Units 11/30/2021    3:35 AM 11/29/2021    2:51 AM 11/28/2021   12:36 PM  CBC  WBC 4.0 - 10.5 K/uL 9.3  10.2  10.9   Hemoglobin 12.0 - 15.0 g/dL 8.0  8.4  8.4   Hematocrit 36.0 - 46.0 % 26.6  29.0  28.3   Platelets 150 - 400 K/uL 289  299  304     ABG    Component Value Date/Time   PHART 7.41 11/28/2021 1310   PCO2ART 42 11/28/2021 1310   PO2ART 58 (L) 11/28/2021 1310   HCO3 26.6 11/28/2021 1310   O2SAT 86.9 11/28/2021  1310    CBG (last 3)  Recent Labs    11/30/21 0428 11/30/21 0743 11/30/21 1118  GLUCAP 131* 152* 116*   Iron/TIBC/Ferritin/ %Sat    Component Value Date/Time   IRON 16 (L) 11/30/2021 0335   TIBC 288 11/30/2021 0335   FERRITIN 31 11/30/2021 0335   IRONPCTSAT 6 (L) 11/30/2021 0335     Signature:  Chesley Mires, MD [place] Pager - [phone] 11/30/2021, 1:52 PM

## 2021-11-30 NOTE — Care Management Important Message (Signed)
Important Message  Patient Details  Name: Michaela Moon MRN: [id number] Date of Birth: 1935-12-21   Medicare Important Message Given:  Yes     Tommy Medal 11/30/2021, 2:49 PM

## 2021-11-30 NOTE — Progress Notes (Addendum)
PROGRESS NOTE    Michaela Moon  BLT:[id number] DOB: 1935-05-21 DOA: 11/28/2021 PCP: [name], MD   Brief Narrative:  Michaela Moon is a 86 y.o. female with medical history significant for COPD, diabetes mellitus, hypertension, dysphagia requiring dilation.  Patient was brought to the ED with low oxygen level, sats in the 80s on home 2 L.  She was admitted with acute on chronic hypoxemic respiratory failure secondary to multifocal pneumonia and has been started on IV Rocephin and azithromycin.  Pulmonology following.  Assessment & Plan:   Principal Problem:   Multifocal pneumonia Active Problems:   Acute on chronic respiratory failure with hypoxia (HCC)   AKI (acute kidney injury) ([place])   Dysphagia   Hypertension   Diabetes mellitus without complication (HCC)   COPD (chronic obstructive pulmonary disease) (HCC)   Lung nodule   Pressure injury of skin   Acute respiratory failure with hypoxemia (HCC)   Recurrent pneumonia  Assessment and Plan:   Multifocal pneumonia Pneumonia with acute on chronic respiratory failure.  Rules out for sepsis.  Tmax 99.6.  WBC 10.9.  Heart rate 93-107.  Not tachypneic.  ABG shows pH of 7.4, PCO2 of 42.  Chronic dysphagia.  CT confirms multifocal dependent consolidations, possibly aspiration.  No PE.  Recent hospitalization at [facility] for same. -Monitor respiration closely -Currently on high flow nasal cannula with sats 92 to 100% -Continue IV ceftriaxone and azithromycin -Appreciate ongoing PCCM evaluation -Given a dose of IV Lasix yesterday with 2 L diuresed, may consider repeating today   Acute on chronic respiratory failure with hypoxia (HCC) O2 sats down to 79% on 3 L.  Currently on high flow nasal cannula sats 92 to 100%.  ABG shows pH of 7.4, PCO2 of 42, PO2 of 58.  Likely secondary to multifocal pneumonia, possible component of COPD exacerbation.   AKI (acute kidney injury) (HCC)-resolved Creatinine elevated 1.4.  Baseline  0.7-0.8.  Has 2+ pitting edema bilateral lower extremity, this is chronic. -Continue to monitor BMP -Especially with diuresis   Dysphagia Last EGD 04/2016 by Dr. Oneida Alar - no endoscopic esophageal abnormality to explain patient's dysphagia. Esophagus dilated. -Appreciate SLP evaluation, patient now on dysphagia 3 diet   Lung nodule Incidental finding - the 1 cm nodule in the anterior right upper lobe appears subsolid on today's exam. As previously noted, bronchogenic carcinoma cannot be excluded. Recommend attention on follow-up imaging. -Follow-up as outpatient   COPD (chronic obstructive pulmonary disease) (HCC) Possible component of COPD exacerbation.  Diffuse rattling in chest.  Likely from secretions. -DuoNebs as needed and scheduled -Hold off on steroids for now   Diabetes mellitus without complication ([place]) Controlled.  A1c 6.1. - SSI- S q6h -Hold metformin, Januvia, Lantus 20 units twice daily, glimepiride   Hypertension Stable. -N.p.o. for now, hold losartan/HCTZ, metoprolol 50 twice daily -As needed labetalol IV 10 mg -IV Lasix given 10/5  Hypomagnesemia -Replete and reevaluate in a.m.   Obesity -BMI 30.67     DVT prophylaxis:Heparin Code Status: Full Family Communication: None, tried calling son with no response 10/6 Disposition Plan:  Status is: Inpatient Remains inpatient appropriate because: High oxygen requirements and IV medications.   Skin Assessment:   I have examined the patient's skin and I agree with the wound assessment as performed by the wound care RN as outlined below:       Pressure Injury 11/28/21 Sacrum Bilateral Stage 1 -  Intact skin with non-blanchable redness of a localized area usually over a  bony prominence. (Active)  11/28/21 1800  Location: Sacrum  Location Orientation: Bilateral  Staging: Stage 1 -  Intact skin with non-blanchable redness of a localized area usually over a bony prominence.  Wound Description (Comments):   Present  on Admission: Yes  Dressing Type Foam - Lift dressing to assess site every shift 11/29/21 0830      Consultants:  PCCM   Procedures:  None   Antimicrobials:  Anti-infectives (From admission, onward)    Start     Dose/Rate Route Frequency Ordered Stop   11/29/21 1500  cefTRIAXone (ROCEPHIN) 2 g in sodium chloride 0.9 % 100 mL IVPB        2 g 200 mL/hr over 30 Minutes Intravenous Every 24 hours 11/28/21 1858 12/04/21 1459   11/29/21 1500  azithromycin (ZITHROMAX) 500 mg in sodium chloride 0.9 % 250 mL IVPB        500 mg 250 mL/hr over 60 Minutes Intravenous Every 24 hours 11/28/21 1858 12/04/21 1459   11/28/21 1515  cefTRIAXone (ROCEPHIN) 2 g in sodium chloride 0.9 % 100 mL IVPB        2 g 200 mL/hr over 30 Minutes Intravenous  Once 11/28/21 1500 11/28/21 1623   11/28/21 1515  azithromycin (ZITHROMAX) tablet 500 mg        500 mg Oral  Once 11/28/21 1500 11/28/21 1548       Subjective: Patient seen and evaluated today with no new acute complaints or concerns. No acute concerns or events noted overnight.  She continues to remain on heated high flow nasal cannula.  Objective: Vitals:   11/30/21 1000 11/30/21 1020 11/30/21 1100 11/30/21 1200  BP: (!) 136/47  (!) 131/54 116/80  Pulse: (!) 110 (!) 109 (!) 103 (!) 101  Resp: (!) '24 18  18  '$ Temp:  98.8 F (37.1 C) 97.6 F (36.4 C)   TempSrc:  Axillary    SpO2: (!) 87% (!) 89% 90% (!) 87%  Weight:      Height:        Intake/Output Summary (Last 24 hours) at 11/30/2021 1229 Last data filed at 11/30/2021 1200 Gross per 24 hour  Intake 429.97 ml  Output 1675 ml  Net -1245.03 ml   Filed Weights   11/28/21 1215 11/28/21 1757 11/30/21 0429  Weight: 81.7 kg 83.6 kg 81.6 kg    Examination:  General exam: Appears calm and comfortable  Respiratory system: Crackles bilaterally. Respiratory effort normal.  Heated high flow nasal cannula 30 L Cardiovascular system: S1 & S2 heard, RRR.  Gastrointestinal system: Abdomen is  soft Central nervous system: Alert and awake Extremities: No edema Skin: No significant lesions noted Psychiatry: Flat affect.    Data Reviewed: I have personally reviewed following labs and imaging studies  CBC: Recent Labs  Lab 11/28/21 1236 11/29/21 0251 11/30/21 0335  WBC 10.9* 10.2 9.3  HGB 8.4* 8.4* 8.0*  HCT 28.3* 29.0* 26.6*  MCV 78.6* 78.6* 77.8*  PLT 304 299 759   Basic Metabolic Panel: Recent Labs  Lab 11/28/21 1236 11/29/21 0251 11/30/21 0335  NA 140 142 141  K 3.9 3.7 3.7  CL 106 106 104  CO2 '25 25 28  '$ GLUCOSE 106* 96 136*  BUN 27* 18 15  CREATININE 1.40* 0.87 0.73  CALCIUM 9.2 9.4 9.9  MG  --   --  1.2*   GFR: Estimated Creatinine Clearance: 55.2 mL/min (by C-G formula based on SCr of 0.73 mg/dL). Liver Function Tests: Recent Labs  Lab 11/28/21 1236  AST 17  ALT 14  ALKPHOS 38  BILITOT 0.5  PROT 6.8  ALBUMIN 2.8*   No results for input(s): "LIPASE", "AMYLASE" in the last 168 hours. No results for input(s): "AMMONIA" in the last 168 hours. Coagulation Profile: No results for input(s): "INR", "PROTIME" in the last 168 hours. Cardiac Enzymes: No results for input(s): "CKTOTAL", "CKMB", "CKMBINDEX", "TROPONINI" in the last 168 hours. BNP (last 3 results) No results for input(s): "PROBNP" in the last 8760 hours. HbA1C: No results for input(s): "HGBA1C" in the last 72 hours. CBG: Recent Labs  Lab 11/29/21 1923 11/29/21 2335 11/30/21 0428 11/30/21 0743 11/30/21 1118  GLUCAP 142* 138* 131* 152* 116*   Lipid Profile: No results for input(s): "CHOL", "HDL", "LDLCALC", "TRIG", "CHOLHDL", "LDLDIRECT" in the last 72 hours. Thyroid Function Tests: No results for input(s): "TSH", "T4TOTAL", "FREET4", "T3FREE", "THYROIDAB" in the last 72 hours. Anemia Panel: Recent Labs    11/30/21 0335  FERRITIN 31  TIBC 288  IRON 16*   Sepsis Labs: No results for input(s): "PROCALCITON", "LATICACIDVEN" in the last 168 hours.  Recent Results (from  the past 240 hour(s))  Resp Panel by RT-PCR (Flu A&B, Covid) Anterior Nasal Swab     Status: None   Collection Time: 11/28/21  1:00 PM   Specimen: Anterior Nasal Swab  Result Value Ref Range Status   SARS Coronavirus 2 by RT PCR NEGATIVE NEGATIVE Final    Comment: (NOTE) SARS-CoV-2 target nucleic acids are NOT DETECTED.  The SARS-CoV-2 RNA is generally detectable in upper respiratory specimens during the acute phase of infection. The lowest concentration of SARS-CoV-2 viral copies this assay can detect is 138 copies/mL. A negative result does not preclude SARS-Cov-2 infection and should not be used as the sole basis for treatment or other patient management decisions. A negative result may occur with  improper specimen collection/handling, submission of specimen other than nasopharyngeal swab, presence of viral mutation(s) within the areas targeted by this assay, and inadequate number of viral copies(<138 copies/mL). A negative result must be combined with clinical observations, patient history, and epidemiological information. The expected result is Negative.  Fact Sheet for Patients:  EntrepreneurPulse.com.au  Fact Sheet for Healthcare Providers:  IncredibleEmployment.be  This test is no t yet approved or cleared by the [place] FDA and  has been authorized for detection and/or diagnosis of SARS-CoV-2 by FDA under an Emergency Use Authorization (EUA). This EUA will remain  in effect (meaning this test can be used) for the duration of the COVID-19 declaration under Section 564(b)(1) of the Act, 21 U.S.C.section 360bbb-3(b)(1), unless the authorization is terminated  or revoked sooner.       Influenza A by PCR NEGATIVE NEGATIVE Final   Influenza B by PCR NEGATIVE NEGATIVE Final    Comment: (NOTE) The Xpert Xpress SARS-CoV-2/FLU/RSV plus assay is intended as an aid in the diagnosis of influenza from Nasopharyngeal swab specimens  and should not be used as a sole basis for treatment. Nasal washings and aspirates are unacceptable for Xpert Xpress SARS-CoV-2/FLU/RSV testing.  Fact Sheet for Patients: EntrepreneurPulse.com.au  Fact Sheet for Healthcare Providers: IncredibleEmployment.be  This test is not yet approved or cleared by the [place] FDA and has been authorized for detection and/or diagnosis of SARS-CoV-2 by FDA under an Emergency Use Authorization (EUA). This EUA will remain in effect (meaning this test can be used) for the duration of the COVID-19 declaration under Section 564(b)(1) of the Act, 21 U.S.C. section 360bbb-3(b)(1), unless the authorization is terminated or revoked.  Performed at [facility], [address], [place], [place] [address]   MRSA Next Gen by PCR, Nasal     Status: None   Collection Time: 11/28/21  5:58 PM   Specimen: Nasal Mucosa; Nasal Swab  Result Value Ref Range Status   MRSA by PCR Next Gen NOT DETECTED NOT DETECTED Final    Comment: (NOTE) The GeneXpert MRSA Assay (FDA approved for NASAL specimens only), is one component of a comprehensive MRSA colonization surveillance program. It is not intended to diagnose MRSA infection nor to guide or monitor treatment for MRSA infections. Test performance is not FDA approved in patients less than 83 years old. Performed at [facility], [address], [place], [place] [address]          Radiology Studies: DG Chest 1 View  Result Date: 11/29/2021 CLINICAL DATA:  Shortness of breath EXAM: CHEST  1 VIEW COMPARISON:  Chest x-ray 11/28/2021. FINDINGS: Cardiomediastinal silhouette is within normal limits. There is increasing patchy airspace disease in the bilateral mid and lower lungs. There is no pleural effusion or pneumothorax. No acute fractures are seen. There is a healed left humeral neck fracture. IMPRESSION: Increasing patchy airspace disease in the bilateral mid and lower lungs,  concerning for multifocal pneumonia. Electronically Signed   By: Ronney Asters M.D.   On: 11/29/2021 20:36   CT Angio Chest PE W and/or Wo Contrast  Result Date: 11/28/2021 CLINICAL DATA:  PE suspected.  Low O2 sats. EXAM: CT ANGIOGRAPHY CHEST WITH CONTRAST TECHNIQUE: Multidetector CT imaging of the chest was performed using the standard protocol during bolus administration of intravenous contrast. Multiplanar CT image reconstructions and MIPs were obtained to evaluate the vascular anatomy. RADIATION DOSE REDUCTION: This exam was performed according to the departmental dose-optimization program which includes automated exposure control, adjustment of the mA and/or kV according to patient size and/or use of iterative reconstruction technique. CONTRAST:  81m OMNIPAQUE IOHEXOL 350 MG/ML SOLN COMPARISON:  CT chest 09/20/2021 FINDINGS: Cardiovascular: Satisfactory opacification of the pulmonary arteries to the segmental level. No evidence of pulmonary embolism. Normal heart size. No pericardial effusion. Calcific aortic atherosclerosis. Multivessel coronary artery calcification. Mediastinum/Nodes: No enlarged mediastinal, hilar, or axillary lymph nodes. Thyroid gland and trachea demonstrate no significant findings. Patulous esophagus. Lungs/Pleura: A 1.0 x 0.8 cm irregular nodule in the anterior right upper lobe appears subsolid on today's exam (series 6, image 57). Irregular consolidation with air bronchograms and surrounding ground-glass opacities dependently located in the left upper lobe is not significantly changed compared to 09/20/2021. Airspace consolidation with air bronchograms dependently located in the right upper and right middle lobes with adjacent ground-glass opacities are increased compared to 09/20/2021. Similarly, patchy consolidations with surrounding ground-glass opacities in the lower lungs are also increased compared to 09/16/2021, particularly in the right lower lobe. No pleural effusion or  pneumothorax. Upper Abdomen: No acute abnormality. Benign left hepatic cyst again noted. No follow-up imaging indicated. Musculoskeletal: No chest wall abnormality. No acute or suspicious osseous findings. Multilevel degenerative changes in the thoracic and visualized upper lumbar spine. Review of the MIP images confirms the above findings. IMPRESSION: 1. No pulmonary embolism. 2. Multifocal, dependently located, consolidations bilaterally, favored to represent multifocal pneumonia, possibly aspiration related. Recommend follow-up CT imaging in 3 months after treatment to ensure resolution. 3. The 1 cm nodule in the anterior right upper lobe appears subsolid on today's exam. As previously noted, bronchogenic carcinoma cannot be excluded. Recommend attention on follow-up imaging. 4. Multivessel coronary artery disease. Aortic Atherosclerosis (ICD10-I70.0).  Electronically Signed   By: Ileana Roup M.D.   On: 11/28/2021 16:15   DG Chest Port 1 View  Result Date: 11/28/2021 CLINICAL DATA:  Dyspnea, shaking, hypoxia, history COPD, asthma, diabetes mellitus, hypertension EXAM: PORTABLE CHEST 1 VIEW COMPARISON:  Portable exam 1304 hours compared to 10/31/2021 FINDINGS: Upper normal size of cardiac silhouette. Mediastinal contours and pulmonary vascularity normal. Atherosclerotic calcification aorta. Bibasilar infiltrates question multifocal pneumonia. Minimal pleural effusions. No pneumothorax. Bones demineralized with posttraumatic deformity proximal LEFT humerus and multilevel degenerative disc disease changes/scoliosis thoracic spine. IMPRESSION: Bibasilar pulmonary infiltrates question multifocal pneumonia. Minimal pleural effusions. Aortic Atherosclerosis (ICD10-I70.0). Electronically Signed   By: Lavonia Dana M.D.   On: 11/28/2021 13:15        Scheduled Meds:  arformoterol  15 mcg Nebulization BID   budesonide (PULMICORT) nebulizer solution  0.5 mg Nebulization BID   Chlorhexidine Gluconate Cloth  6 each  Topical Q0600   guaiFENesin  1,200 mg Oral BID   heparin  5,000 Units Subcutaneous Q8H   insulin aspart  0-9 Units Subcutaneous Q4H   mouth rinse  15 mL Mouth Rinse 4 times per day   pantoprazole  40 mg Oral Daily   revefenacin  175 mcg Nebulization Daily   Continuous Infusions:  sodium chloride 10 mL/hr at 11/30/21 1200   azithromycin Stopped (11/29/21 1509)   cefTRIAXone (ROCEPHIN)  IV Stopped (11/29/21 1544)     LOS: 2 days    Time spent: 35 minutes    Jalen Oberry Darleen Crocker, DO [facility]  If 7PM-7AM, please contact night-coverage [email or web address] 11/30/2021, 12:29 PM

## 2021-11-30 NOTE — Plan of Care (Signed)
Patient remains in CCU on HFNC.   Problem: Education: Goal: Knowledge of General Education information will improve Description: Including pain rating scale, medication(s)/side effects and non-pharmacologic comfort measures Outcome: Progressing   Problem: Health Behavior/Discharge Planning: Goal: Ability to manage health-related needs will improve Outcome: Progressing   Problem: Clinical Measurements: Goal: Ability to maintain clinical measurements within normal limits will improve Outcome: Progressing Goal: Will remain free from infection Outcome: Progressing Goal: Diagnostic test results will improve Outcome: Progressing Goal: Respiratory complications will improve Outcome: Progressing Goal: Cardiovascular complication will be avoided Outcome: Progressing   Problem: Activity: Goal: Risk for activity intolerance will decrease Outcome: Progressing   Problem: Nutrition: Goal: Adequate nutrition will be maintained Outcome: Progressing   Problem: Coping: Goal: Level of anxiety will decrease Outcome: Progressing   Problem: Elimination: Goal: Will not experience complications related to bowel motility Outcome: Progressing Goal: Will not experience complications related to urinary retention Outcome: Progressing   Problem: Pain Managment: Goal: General experience of comfort will improve Outcome: Progressing   Problem: Safety: Goal: Ability to remain free from injury will improve Outcome: Progressing   Problem: Skin Integrity: Goal: Risk for impaired skin integrity will decrease Outcome: Progressing   Problem: Activity: Goal: Ability to tolerate increased activity will improve Outcome: Progressing   Problem: Clinical Measurements: Goal: Ability to maintain a body temperature in the normal range will improve Outcome: Progressing   Problem: Respiratory: Goal: Ability to maintain adequate ventilation will improve Outcome: Progressing Goal: Ability to maintain a  clear airway will improve Outcome: Progressing   Problem: Education: Goal: Ability to describe self-care measures that may prevent or decrease complications (Diabetes Survival Skills Education) will improve Outcome: Progressing Goal: Individualized Educational Video(s) Outcome: Progressing   Problem: Coping: Goal: Ability to adjust to condition or change in health will improve Outcome: Progressing   Problem: Fluid Volume: Goal: Ability to maintain a balanced intake and output will improve Outcome: Progressing   Problem: Health Behavior/Discharge Planning: Goal: Ability to identify and utilize available resources and services will improve Outcome: Progressing Goal: Ability to manage health-related needs will improve Outcome: Progressing   Problem: Metabolic: Goal: Ability to maintain appropriate glucose levels will improve Outcome: Progressing   Problem: Nutritional: Goal: Maintenance of adequate nutrition will improve Outcome: Progressing Goal: Progress toward achieving an optimal weight will improve Outcome: Progressing   Problem: Skin Integrity: Goal: Risk for impaired skin integrity will decrease Outcome: Progressing   Problem: Tissue Perfusion: Goal: Adequacy of tissue perfusion will improve Outcome: Progressing

## 2021-12-01 DIAGNOSIS — J189 Pneumonia, unspecified organism: Secondary | ICD-10-CM | POA: Diagnosis not present

## 2021-12-01 LAB — CBC
HCT: 25 % — ABNORMAL LOW (ref 36.0–46.0)
Hemoglobin: 7.6 g/dL — ABNORMAL LOW (ref 12.0–15.0)
MCH: 23.4 pg — ABNORMAL LOW (ref 26.0–34.0)
MCHC: 30.4 g/dL (ref 30.0–36.0)
MCV: 76.9 fL — ABNORMAL LOW (ref 80.0–100.0)
Platelets: 285 10*3/uL (ref 150–400)
RBC: 3.25 MIL/uL — ABNORMAL LOW (ref 3.87–5.11)
RDW: 21.7 % — ABNORMAL HIGH (ref 11.5–15.5)
WBC: 7.2 10*3/uL (ref 4.0–10.5)
nRBC: 0.3 % — ABNORMAL HIGH (ref 0.0–0.2)

## 2021-12-01 LAB — BASIC METABOLIC PANEL
Anion gap: 8 (ref 5–15)
BUN: 12 mg/dL (ref 8–23)
CO2: 28 mmol/L (ref 22–32)
Calcium: 9.6 mg/dL (ref 8.9–10.3)
Chloride: 105 mmol/L (ref 98–111)
Creatinine, Ser: 0.65 mg/dL (ref 0.44–1.00)
GFR, Estimated: >60 mL/min (ref >60)
Glucose, Bld: 136 mg/dL — ABNORMAL HIGH (ref 70–99)
Potassium: 3.4 mmol/L — ABNORMAL LOW (ref 3.5–5.1)
Sodium: 141 mmol/L (ref 135–145)

## 2021-12-01 LAB — GLUCOSE, CAPILLARY
Glucose-Capillary: 107 mg/dL — ABNORMAL HIGH (ref 70–99)
Glucose-Capillary: 113 mg/dL — ABNORMAL HIGH (ref 70–99)
Glucose-Capillary: 119 mg/dL — ABNORMAL HIGH (ref 70–99)
Glucose-Capillary: 132 mg/dL — ABNORMAL HIGH (ref 70–99)
Glucose-Capillary: 138 mg/dL — ABNORMAL HIGH (ref 70–99)
Glucose-Capillary: 141 mg/dL — ABNORMAL HIGH (ref 70–99)
Glucose-Capillary: 151 mg/dL — ABNORMAL HIGH (ref 70–99)

## 2021-12-01 LAB — MAGNESIUM: Magnesium: 1.7 mg/dL (ref 1.7–2.4)

## 2021-12-01 MED ORDER — POTASSIUM CHLORIDE CRYS ER 20 MEQ PO TBCR
40.0000 meq | EXTENDED_RELEASE_TABLET | Freq: Once | ORAL | Status: AC
  Administered 2021-12-01: 40 meq via ORAL
  Filled 2021-12-01: qty 2

## 2021-12-01 MED ORDER — ORAL CARE MOUTH RINSE: 15.0000 mL | OROMUCOSAL | Status: DC | PRN

## 2021-12-01 NOTE — Progress Notes (Signed)
PROGRESS NOTE    Michaela Moon  DJS:[id number] DOB: 02-26-1935 DOA: 11/28/2021 PCP: [name], MD   Brief Narrative:    Michaela Moon is a 86 y.o. female with medical history significant for COPD, diabetes mellitus, hypertension, dysphagia requiring dilation.  Patient was brought to the ED with low oxygen level, sats in the 80s on home 2 L.  She was admitted with acute on chronic hypoxemic respiratory failure secondary to multifocal pneumonia and has been started on IV Rocephin and azithromycin.  Pulmonology following.  Assessment & Plan:   Principal Problem:   Multifocal pneumonia Active Problems:   Acute on chronic respiratory failure with hypoxia (HCC)   AKI (acute kidney injury) ([place])   Dysphagia   Hypertension   Diabetes mellitus without complication (HCC)   COPD (chronic obstructive pulmonary disease) (HCC)   Lung nodule   Pressure injury of skin   Acute respiratory failure with hypoxemia (HCC)   Recurrent pneumonia  Assessment and Plan:   Multifocal pneumonia Pneumonia with acute on chronic respiratory failure.  Rules out for sepsis.  Tmax 99.6.  WBC 10.9.  Heart rate 93-107.  Not tachypneic.  ABG shows pH of 7.4, PCO2 of 42.  Chronic dysphagia.  CT confirms multifocal dependent consolidations, possibly aspiration.  No PE.  Recent hospitalization at [facility] for same. -Monitor respiration closely -Currently on high flow nasal cannula with sats 92 to 100% -Continue IV ceftriaxone and azithromycin d4/7 -Appreciate ongoing PCCM evaluation   Acute on chronic respiratory failure with hypoxia (HCC) O2 sats down to 79% on 3 L.  Currently on high flow nasal cannula sats 92 to 100%.  ABG shows pH of 7.4, PCO2 of 42, PO2 of 58.  Likely secondary to multifocal pneumonia, possible component of COPD exacerbation.  Iron deficiency anemia Transfuse for hemoglobin less than 7  Started on Feraheme 10/6   AKI (acute kidney injury) (HCC)-resolved Creatinine elevated  1.4.  Baseline 0.7-0.8.  Has 2+ pitting edema bilateral lower extremity, this is chronic. -Continue to monitor BMP -Especially with diuresis   Dysphagia Last EGD 04/2016 by Dr. Oneida Alar - no endoscopic esophageal abnormality to explain patient's dysphagia. Esophagus dilated. -Appreciate SLP evaluation, patient now on dysphagia 3 diet   Lung nodule Incidental finding - the 1 cm nodule in the anterior right upper lobe appears subsolid on today's exam. As previously noted, bronchogenic carcinoma cannot be excluded. Recommend attention on follow-up imaging. -Follow-up as outpatient   COPD (chronic obstructive pulmonary disease) (HCC) Possible component of COPD exacerbation.  Diffuse rattling in chest.  Likely from secretions. -DuoNebs as needed and scheduled -Hold off on steroids for now   Diabetes mellitus without complication ([place]) Controlled.  A1c 6.1. - SSI- S q6h -Hold metformin, Januvia, Lantus 20 units twice daily, glimepiride   Hypertension Stable. -N.p.o. for now, hold losartan/HCTZ, metoprolol 50 twice daily -As needed labetalol IV 10 mg -IV Lasix given 10/5   Hypokalemia -Replete and reevaluate in a.m.   Obesity -BMI 30.67     DVT prophylaxis:Heparin Code Status: Full Family Communication: None, tried calling son with no response 10/6 Disposition Plan:  Status is: Inpatient Remains inpatient appropriate because: High oxygen requirements and IV medications.   Skin Assessment:   I have examined the patient's skin and I agree with the wound assessment as performed by the wound care RN as outlined below:          Pressure Injury 11/28/21 Sacrum Bilateral Stage 1 -  Intact skin with non-blanchable redness  of a localized area usually over a bony prominence. (Active)  11/28/21 1800  Location: Sacrum  Location Orientation: Bilateral  Staging: Stage 1 -  Intact skin with non-blanchable redness of a localized area usually over a bony prominence.  Wound Description  (Comments):   Present on Admission: Yes  Dressing Type Foam - Lift dressing to assess site every shift 11/29/21 0830      Consultants:  PCCM   Procedures:  None   Antimicrobials:  Anti-infectives (From admission, onward)    Start     Dose/Rate Route Frequency Ordered Stop   11/29/21 1500  cefTRIAXone (ROCEPHIN) 2 g in sodium chloride 0.9 % 100 mL IVPB        2 g 200 mL/hr over 30 Minutes Intravenous Every 24 hours 11/28/21 1858 12/04/21 1459   11/29/21 1500  azithromycin (ZITHROMAX) 500 mg in sodium chloride 0.9 % 250 mL IVPB        500 mg 250 mL/hr over 60 Minutes Intravenous Every 24 hours 11/28/21 1858 12/04/21 1459   11/28/21 1515  cefTRIAXone (ROCEPHIN) 2 g in sodium chloride 0.9 % 100 mL IVPB        2 g 200 mL/hr over 30 Minutes Intravenous  Once 11/28/21 1500 11/28/21 1623   11/28/21 1515  azithromycin (ZITHROMAX) tablet 500 mg        500 mg Oral  Once 11/28/21 1500 11/28/21 1548      Subjective: Patient seen and evaluated today with no new acute complaints or concerns. No acute concerns or events noted overnight.  Objective: Vitals:   12/01/21 0700 12/01/21 0747 12/01/21 0800 12/01/21 0900  BP: (!) 162/96  130/85 (!) 139/104  Pulse: 81 89 86 (!) 107  Resp: 18 (!) '23 20 18  '$ Temp:  98.4 F (36.9 C)    TempSrc:  Oral    SpO2: 95% 95% 94% 95%  Weight:      Height:        Intake/Output Summary (Last 24 hours) at 12/01/2021 1003 Last data filed at 12/01/2021 0825 Gross per 24 hour  Intake 1021.42 ml  Output --  Net 1021.42 ml   Filed Weights   11/28/21 1757 11/30/21 0429 12/01/21 0400  Weight: 83.6 kg 81.6 kg 82.4 kg    Examination:  General exam: Appears calm and comfortable  Respiratory system: Clear to auscultation. Respiratory effort normal.  Continues on high flow nasal cannula oxygen. Cardiovascular system: S1 & S2 heard, RRR.  Gastrointestinal system: Abdomen is soft Central nervous system: Alert and awake Extremities: No edema Skin: No  significant lesions noted Psychiatry: Flat affect.    Data Reviewed: I have personally reviewed following labs and imaging studies  CBC: Recent Labs  Lab 11/28/21 1236 11/29/21 0251 11/30/21 0335 12/01/21 0500  WBC 10.9* 10.2 9.3 7.2  HGB 8.4* 8.4* 8.0* 7.6*  HCT 28.3* 29.0* 26.6* 25.0*  MCV 78.6* 78.6* 77.8* 76.9*  PLT 304 299 289 401   Basic Metabolic Panel: Recent Labs  Lab 11/28/21 1236 11/29/21 0251 11/30/21 0335 12/01/21 0500  NA 140 142 141 141  K 3.9 3.7 3.7 3.4*  CL 106 106 104 105  CO2 '25 25 28 28  '$ GLUCOSE 106* 96 136* 136*  BUN 27* '18 15 12  '$ CREATININE 1.40* 0.87 0.73 0.65  CALCIUM 9.2 9.4 9.9 9.6  MG  --   --  1.2* 1.7   GFR: Estimated Creatinine Clearance: 55.5 mL/min (by C-G formula based on SCr of 0.65 mg/dL). Liver Function Tests: Recent Labs  Lab 11/28/21 1236  AST 17  ALT 14  ALKPHOS 38  BILITOT 0.5  PROT 6.8  ALBUMIN 2.8*   No results for input(s): "LIPASE", "AMYLASE" in the last 168 hours. No results for input(s): "AMMONIA" in the last 168 hours. Coagulation Profile: No results for input(s): "INR", "PROTIME" in the last 168 hours. Cardiac Enzymes: No results for input(s): "CKTOTAL", "CKMB", "CKMBINDEX", "TROPONINI" in the last 168 hours. BNP (last 3 results) No results for input(s): "PROBNP" in the last 8760 hours. HbA1C: No results for input(s): "HGBA1C" in the last 72 hours. CBG: Recent Labs  Lab 11/30/21 1624 11/30/21 2008 12/01/21 0001 12/01/21 0438 12/01/21 0728  GLUCAP 124* 171* 113* 132* 119*   Lipid Profile: No results for input(s): "CHOL", "HDL", "LDLCALC", "TRIG", "CHOLHDL", "LDLDIRECT" in the last 72 hours. Thyroid Function Tests: No results for input(s): "TSH", "T4TOTAL", "FREET4", "T3FREE", "THYROIDAB" in the last 72 hours. Anemia Panel: Recent Labs    11/30/21 0335  FERRITIN 31  TIBC 288  IRON 16*   Sepsis Labs: No results for input(s): "PROCALCITON", "LATICACIDVEN" in the last 168 hours.  Recent  Results (from the past 240 hour(s))  Resp Panel by RT-PCR (Flu A&B, Covid) Anterior Nasal Swab     Status: None   Collection Time: 11/28/21  1:00 PM   Specimen: Anterior Nasal Swab  Result Value Ref Range Status   SARS Coronavirus 2 by RT PCR NEGATIVE NEGATIVE Final    Comment: (NOTE) SARS-CoV-2 target nucleic acids are NOT DETECTED.  The SARS-CoV-2 RNA is generally detectable in upper respiratory specimens during the acute phase of infection. The lowest concentration of SARS-CoV-2 viral copies this assay can detect is 138 copies/mL. A negative result does not preclude SARS-Cov-2 infection and should not be used as the sole basis for treatment or other patient management decisions. A negative result may occur with  improper specimen collection/handling, submission of specimen other than nasopharyngeal swab, presence of viral mutation(s) within the areas targeted by this assay, and inadequate number of viral copies(<138 copies/mL). A negative result must be combined with clinical observations, patient history, and epidemiological information. The expected result is Negative.  Fact Sheet for Patients:  EntrepreneurPulse.com.au  Fact Sheet for Healthcare Providers:  IncredibleEmployment.be  This test is no t yet approved or cleared by the [place] FDA and  has been authorized for detection and/or diagnosis of SARS-CoV-2 by FDA under an Emergency Use Authorization (EUA). This EUA will remain  in effect (meaning this test can be used) for the duration of the COVID-19 declaration under Section 564(b)(1) of the Act, 21 U.S.C.section 360bbb-3(b)(1), unless the authorization is terminated  or revoked sooner.       Influenza A by PCR NEGATIVE NEGATIVE Final   Influenza B by PCR NEGATIVE NEGATIVE Final    Comment: (NOTE) The Xpert Xpress SARS-CoV-2/FLU/RSV plus assay is intended as an aid in the diagnosis of influenza from Nasopharyngeal swab  specimens and should not be used as a sole basis for treatment. Nasal washings and aspirates are unacceptable for Xpert Xpress SARS-CoV-2/FLU/RSV testing.  Fact Sheet for Patients: EntrepreneurPulse.com.au  Fact Sheet for Healthcare Providers: IncredibleEmployment.be  This test is not yet approved or cleared by the [place] FDA and has been authorized for detection and/or diagnosis of SARS-CoV-2 by FDA under an Emergency Use Authorization (EUA). This EUA will remain in effect (meaning this test can be used) for the duration of the COVID-19 declaration under Section 564(b)(1) of the Act, 21 U.S.C. section 360bbb-3(b)(1), unless the authorization  is terminated or revoked.  Performed at [facility], [address], [place], [place] [address]   MRSA Next Gen by PCR, Nasal     Status: None   Collection Time: 11/28/21  5:58 PM   Specimen: Nasal Mucosa; Nasal Swab  Result Value Ref Range Status   MRSA by PCR Next Gen NOT DETECTED NOT DETECTED Final    Comment: (NOTE) The GeneXpert MRSA Assay (FDA approved for NASAL specimens only), is one component of a comprehensive MRSA colonization surveillance program. It is not intended to diagnose MRSA infection nor to guide or monitor treatment for MRSA infections. Test performance is not FDA approved in patients less than 28 years old. Performed at [facility], [address], [place], [place] [address]          Radiology Studies: DG Chest 1 View  Result Date: 11/29/2021 CLINICAL DATA:  Shortness of breath EXAM: CHEST  1 VIEW COMPARISON:  Chest x-ray 11/28/2021. FINDINGS: Cardiomediastinal silhouette is within normal limits. There is increasing patchy airspace disease in the bilateral mid and lower lungs. There is no pleural effusion or pneumothorax. No acute fractures are seen. There is a healed left humeral neck fracture. IMPRESSION: Increasing patchy airspace disease in the bilateral mid and lower  lungs, concerning for multifocal pneumonia. Electronically Signed   By: Ronney Asters M.D.   On: 11/29/2021 20:36        Scheduled Meds:  arformoterol  15 mcg Nebulization BID   budesonide (PULMICORT) nebulizer solution  0.5 mg Nebulization BID   Chlorhexidine Gluconate Cloth  6 each Topical Q0600   guaiFENesin  1,200 mg Oral BID   heparin  5,000 Units Subcutaneous Q8H   insulin aspart  0-9 Units Subcutaneous Q4H   pantoprazole  40 mg Oral Daily   revefenacin  175 mcg Nebulization Daily   Continuous Infusions:  sodium chloride 10 mL/hr at 12/01/21 0656   azithromycin Stopped (11/30/21 1508)   cefTRIAXone (ROCEPHIN)  IV Stopped (11/30/21 1541)   ferric gluconate (FERRLECIT) IVPB 250 mg (12/01/21 0918)     LOS: 3 days    Time spent: 35 minutes    Grayden Burley Darleen Crocker, DO [facility]  If 7PM-7AM, please contact night-coverage [email or web address] 12/01/2021, 10:03 AM

## 2021-12-02 DIAGNOSIS — J189 Pneumonia, unspecified organism: Secondary | ICD-10-CM | POA: Diagnosis not present

## 2021-12-02 LAB — GLUCOSE, CAPILLARY
Glucose-Capillary: 134 mg/dL — ABNORMAL HIGH (ref 70–99)
Glucose-Capillary: 149 mg/dL — ABNORMAL HIGH (ref 70–99)
Glucose-Capillary: 133 mg/dL — ABNORMAL HIGH (ref 70–99)
Glucose-Capillary: 110 mg/dL — ABNORMAL HIGH (ref 70–99)
Glucose-Capillary: 123 mg/dL — ABNORMAL HIGH (ref 70–99)

## 2021-12-02 LAB — CBC
HCT: 26.9 % — ABNORMAL LOW (ref 36.0–46.0)
Hemoglobin: 8 g/dL — ABNORMAL LOW (ref 12.0–15.0)
MCH: 23.3 pg — ABNORMAL LOW (ref 26.0–34.0)
MCHC: 29.7 g/dL — ABNORMAL LOW (ref 30.0–36.0)
MCV: 78.2 fL — ABNORMAL LOW (ref 80.0–100.0)
Platelets: 276 10*3/uL (ref 150–400)
RBC: 3.44 MIL/uL — ABNORMAL LOW (ref 3.87–5.11)
RDW: 21.9 % — ABNORMAL HIGH (ref 11.5–15.5)
WBC: 8.5 10*3/uL (ref 4.0–10.5)
nRBC: 0.5 % — ABNORMAL HIGH (ref 0.0–0.2)

## 2021-12-02 LAB — BASIC METABOLIC PANEL
Anion gap: 7 (ref 5–15)
BUN: 10 mg/dL (ref 8–23)
CO2: 25 mmol/L (ref 22–32)
Calcium: 9.8 mg/dL (ref 8.9–10.3)
Chloride: 108 mmol/L (ref 98–111)
Creatinine, Ser: 0.6 mg/dL (ref 0.44–1.00)
GFR, Estimated: >60 mL/min (ref >60)
Glucose, Bld: 137 mg/dL — ABNORMAL HIGH (ref 70–99)
Potassium: 4 mmol/L (ref 3.5–5.1)
Sodium: 140 mmol/L (ref 135–145)

## 2021-12-02 LAB — MAGNESIUM: Magnesium: 1.6 mg/dL — ABNORMAL LOW (ref 1.7–2.4)

## 2021-12-02 MED ORDER — MAGNESIUM SULFATE 2 GM/50ML IV SOLN
2.0000 g | Freq: Once | INTRAVENOUS | Status: AC
  Administered 2021-12-02: 2 g via INTRAVENOUS
  Filled 2021-12-02: qty 50

## 2021-12-02 MED ORDER — METOPROLOL TARTRATE 50 MG PO TABS
50.0000 mg | ORAL_TABLET | Freq: Two times a day (BID) | ORAL | Status: DC
  Administered 2021-12-02 – 2021-12-04 (×5): 50 mg via ORAL
  Filled 2021-12-02 (×5): qty 1

## 2021-12-02 MED ORDER — LOSARTAN POTASSIUM 50 MG PO TABS
100.0000 mg | ORAL_TABLET | Freq: Every day | ORAL | Status: DC
  Administered 2021-12-02 – 2021-12-04 (×3): 100 mg via ORAL
  Filled 2021-12-02 (×3): qty 2

## 2021-12-02 NOTE — Progress Notes (Signed)
PROGRESS NOTE    Michaela Moon  EQA:[id number] DOB: 01-26-1936 DOA: 11/28/2021 PCP: [name], MD   Brief Narrative:    Michaela Moon is a 86 y.o. female with medical history significant for COPD, diabetes mellitus, hypertension, dysphagia requiring dilation.  Patient was brought to the ED with low oxygen level, sats in the 80s on home 2 L.  She was admitted with acute on chronic hypoxemic respiratory failure secondary to multifocal pneumonia and has been started on IV Rocephin and azithromycin.  Pulmonology following.  Assessment & Plan:   Principal Problem:   Multifocal pneumonia Active Problems:   Acute on chronic respiratory failure with hypoxia (HCC)   AKI (acute kidney injury) ([place])   Dysphagia   Hypertension   Diabetes mellitus without complication (HCC)   COPD (chronic obstructive pulmonary disease) (HCC)   Lung nodule   Pressure injury of skin   Acute respiratory failure with hypoxemia (HCC)   Recurrent pneumonia  Assessment and Plan:   Multifocal pneumonia Pneumonia with acute on chronic respiratory failure.  Rules out for sepsis.  Tmax 99.6.  WBC 10.9.  Heart rate 93-107.  Not tachypneic.  ABG shows pH of 7.4, PCO2 of 42.  Chronic dysphagia.  CT confirms multifocal dependent consolidations, possibly aspiration.  No PE.  Recent hospitalization at [facility] for same. -Monitor respiration closely -Currently on high flow nasal cannula with sats 92 to 100% -Continue IV ceftriaxone and azithromycin d5/7 -Appreciate ongoing PCCM evaluation   Acute on chronic respiratory failure with hypoxia (HCC) O2 sats down to 79% on 3 L.  Currently on high flow nasal cannula sats 92 to 100%.  ABG shows pH of 7.4, PCO2 of 42, PO2 of 58.  Likely secondary to multifocal pneumonia, possible component of COPD exacerbation.   Iron deficiency anemia-stable Transfuse for hemoglobin less than 7  Started on Feraheme 10/6   AKI (acute kidney injury) (HCC)-resolved Creatinine  elevated 1.4.  Baseline 0.7-0.8.  Has 2+ pitting edema bilateral lower extremity, this is chronic. -Continue to monitor BMP -Especially with diuresis   Dysphagia Last EGD 04/2016 by Dr. Oneida Alar - no endoscopic esophageal abnormality to explain patient's dysphagia. Esophagus dilated. -Appreciate SLP evaluation, patient now on dysphagia 3 diet   Lung nodule Incidental finding - the 1 cm nodule in the anterior right upper lobe appears subsolid on today's exam. As previously noted, bronchogenic carcinoma cannot be excluded. Recommend attention on follow-up imaging. -Follow-up as outpatient   COPD (chronic obstructive pulmonary disease) (HCC) Possible component of COPD exacerbation.  Diffuse rattling in chest.  Likely from secretions. -DuoNebs as needed and scheduled -Hold off on steroids for now   Diabetes mellitus without complication ([place]) Controlled.  A1c 6.1. - SSI- S q6h -Hold metformin, Januvia, Lantus 20 units twice daily, glimepiride   Hypertension -Now elevated, resume home losartan and metoprolol -As needed labetalol IV 10 mg -IV Lasix given 10/5   Hypomagnesemia -Replete and reevaluate in a.m.   Obesity -BMI 30.67     DVT prophylaxis:Heparin Code Status: Full Family Communication: None, tried calling son with no response 10/6 Disposition Plan:  Status is: Inpatient Remains inpatient appropriate because: High oxygen requirements and IV medications.   Skin Assessment:   I have examined the patient's skin and I agree with the wound assessment as performed by the wound care RN as outlined below:          Pressure Injury 11/28/21 Sacrum Bilateral Stage 1 -  Intact skin with non-blanchable redness of a  localized area usually over a bony prominence. (Active)  11/28/21 1800  Location: Sacrum  Location Orientation: Bilateral  Staging: Stage 1 -  Intact skin with non-blanchable redness of a localized area usually over a bony prominence.  Wound Description (Comments):    Present on Admission: Yes  Dressing Type Foam - Lift dressing to assess site every shift 11/29/21 0830      Consultants:  PCCM   Procedures:  None  Antimicrobials:  Anti-infectives (From admission, onward)    Start     Dose/Rate Route Frequency Ordered Stop   11/29/21 1500  cefTRIAXone (ROCEPHIN) 2 g in sodium chloride 0.9 % 100 mL IVPB        2 g 200 mL/hr over 30 Minutes Intravenous Every 24 hours 11/28/21 1858 12/04/21 1459   11/29/21 1500  azithromycin (ZITHROMAX) 500 mg in sodium chloride 0.9 % 250 mL IVPB        500 mg 250 mL/hr over 60 Minutes Intravenous Every 24 hours 11/28/21 1858 12/04/21 1459   11/28/21 1515  cefTRIAXone (ROCEPHIN) 2 g in sodium chloride 0.9 % 100 mL IVPB        2 g 200 mL/hr over 30 Minutes Intravenous  Once 11/28/21 1500 11/28/21 1623   11/28/21 1515  azithromycin (ZITHROMAX) tablet 500 mg        500 mg Oral  Once 11/28/21 1500 11/28/21 1548       Subjective: Patient seen and evaluated today with no new acute complaints or concerns. No acute concerns or events noted overnight.  Objective: Vitals:   12/02/21 0615 12/02/21 0700 12/02/21 0800 12/02/21 0804  BP: (!) 157/66 (!) 182/80 (!) 172/56   Pulse: 82 87 79 71  Resp: '19 20 18 18  '$ Temp:    98.2 F (36.8 C)  TempSrc:    Oral  SpO2: 98% 99% 93% 97%  Weight:      Height:        Intake/Output Summary (Last 24 hours) at 12/02/2021 0840 Last data filed at 12/02/2021 0600 Gross per 24 hour  Intake 643.47 ml  Output 200 ml  Net 443.47 ml   Filed Weights   11/30/21 0429 12/01/21 0400 12/02/21 0436  Weight: 81.6 kg 82.4 kg 81.1 kg    Examination:  General exam: Appears calm and comfortable  Respiratory system: Clear to auscultation. Respiratory effort normal.  20 L high flow nasal cannula Cardiovascular system: S1 & S2 heard, RRR.  Gastrointestinal system: Abdomen is soft Central nervous system: Alert and awake Extremities: No edema Skin: No significant lesions noted Psychiatry:  Flat affect.    Data Reviewed: I have personally reviewed following labs and imaging studies  CBC: Recent Labs  Lab 11/28/21 1236 11/29/21 0251 11/30/21 0335 12/01/21 0500 12/02/21 0434  WBC 10.9* 10.2 9.3 7.2 8.5  HGB 8.4* 8.4* 8.0* 7.6* 8.0*  HCT 28.3* 29.0* 26.6* 25.0* 26.9*  MCV 78.6* 78.6* 77.8* 76.9* 78.2*  PLT 304 299 289 285 027   Basic Metabolic Panel: Recent Labs  Lab 11/28/21 1236 11/29/21 0251 11/30/21 0335 12/01/21 0500 12/02/21 0434  NA 140 142 141 141 140  K 3.9 3.7 3.7 3.4* 4.0  CL 106 106 104 105 108  CO2 '25 25 28 28 25  '$ GLUCOSE 106* 96 136* 136* 137*  BUN 27* '18 15 12 10  '$ CREATININE 1.40* 0.87 0.73 0.65 0.60  CALCIUM 9.2 9.4 9.9 9.6 9.8  MG  --   --  1.2* 1.7 1.6*   GFR: Estimated Creatinine Clearance: 55  mL/min (by C-G formula based on SCr of 0.6 mg/dL). Liver Function Tests: Recent Labs  Lab 11/28/21 1236  AST 17  ALT 14  ALKPHOS 38  BILITOT 0.5  PROT 6.8  ALBUMIN 2.8*   No results for input(s): "LIPASE", "AMYLASE" in the last 168 hours. No results for input(s): "AMMONIA" in the last 168 hours. Coagulation Profile: No results for input(s): "INR", "PROTIME" in the last 168 hours. Cardiac Enzymes: No results for input(s): "CKTOTAL", "CKMB", "CKMBINDEX", "TROPONINI" in the last 168 hours. BNP (last 3 results) No results for input(s): "PROBNP" in the last 8760 hours. HbA1C: No results for input(s): "HGBA1C" in the last 72 hours. CBG: Recent Labs  Lab 12/01/21 1622 12/01/21 1925 12/01/21 2333 12/02/21 0337 12/02/21 0803  GLUCAP 151* 141* 107* 134* 149*   Lipid Profile: No results for input(s): "CHOL", "HDL", "LDLCALC", "TRIG", "CHOLHDL", "LDLDIRECT" in the last 72 hours. Thyroid Function Tests: No results for input(s): "TSH", "T4TOTAL", "FREET4", "T3FREE", "THYROIDAB" in the last 72 hours. Anemia Panel: Recent Labs    11/30/21 0335  FERRITIN 31  TIBC 288  IRON 16*   Sepsis Labs: No results for input(s): "PROCALCITON",  "LATICACIDVEN" in the last 168 hours.  Recent Results (from the past 240 hour(s))  Resp Panel by RT-PCR (Flu A&B, Covid) Anterior Nasal Swab     Status: None   Collection Time: 11/28/21  1:00 PM   Specimen: Anterior Nasal Swab  Result Value Ref Range Status   SARS Coronavirus 2 by RT PCR NEGATIVE NEGATIVE Final    Comment: (NOTE) SARS-CoV-2 target nucleic acids are NOT DETECTED.  The SARS-CoV-2 RNA is generally detectable in upper respiratory specimens during the acute phase of infection. The lowest concentration of SARS-CoV-2 viral copies this assay can detect is 138 copies/mL. A negative result does not preclude SARS-Cov-2 infection and should not be used as the sole basis for treatment or other patient management decisions. A negative result may occur with  improper specimen collection/handling, submission of specimen other than nasopharyngeal swab, presence of viral mutation(s) within the areas targeted by this assay, and inadequate number of viral copies(<138 copies/mL). A negative result must be combined with clinical observations, patient history, and epidemiological information. The expected result is Negative.  Fact Sheet for Patients:  EntrepreneurPulse.com.au  Fact Sheet for Healthcare Providers:  IncredibleEmployment.be  This test is no t yet approved or cleared by the [place] FDA and  has been authorized for detection and/or diagnosis of SARS-CoV-2 by FDA under an Emergency Use Authorization (EUA). This EUA will remain  in effect (meaning this test can be used) for the duration of the COVID-19 declaration under Section 564(b)(1) of the Act, 21 U.S.C.section 360bbb-3(b)(1), unless the authorization is terminated  or revoked sooner.       Influenza A by PCR NEGATIVE NEGATIVE Final   Influenza B by PCR NEGATIVE NEGATIVE Final    Comment: (NOTE) The Xpert Xpress SARS-CoV-2/FLU/RSV plus assay is intended as an aid in the  diagnosis of influenza from Nasopharyngeal swab specimens and should not be used as a sole basis for treatment. Nasal washings and aspirates are unacceptable for Xpert Xpress SARS-CoV-2/FLU/RSV testing.  Fact Sheet for Patients: EntrepreneurPulse.com.au  Fact Sheet for Healthcare Providers: IncredibleEmployment.be  This test is not yet approved or cleared by the [place] FDA and has been authorized for detection and/or diagnosis of SARS-CoV-2 by FDA under an Emergency Use Authorization (EUA). This EUA will remain in effect (meaning this test can be used) for the duration of  the COVID-19 declaration under Section 564(b)(1) of the Act, 21 U.S.C. section 360bbb-3(b)(1), unless the authorization is terminated or revoked.  Performed at [facility], [address] [place], [place] [address]   MRSA Next Gen by PCR, Nasal     Status: None   Collection Time: 11/28/21  5:58 PM   Specimen: Nasal Mucosa; Nasal Swab  Result Value Ref Range Status   MRSA by PCR Next Gen NOT DETECTED NOT DETECTED Final    Comment: (NOTE) The GeneXpert MRSA Assay (FDA approved for NASAL specimens only), is one component of a comprehensive MRSA colonization surveillance program. It is not intended to diagnose MRSA infection nor to guide or monitor treatment for MRSA infections. Test performance is not FDA approved in patients less than 60 years old. Performed at [facility], [address], [place], [place] [address]          Radiology Studies: No results found.      Scheduled Meds:  arformoterol  15 mcg Nebulization BID   budesonide (PULMICORT) nebulizer solution  0.5 mg Nebulization BID   Chlorhexidine Gluconate Cloth  6 each Topical Q0600   guaiFENesin  1,200 mg Oral BID   heparin  5,000 Units Subcutaneous Q8H   insulin aspart  0-9 Units Subcutaneous Q4H   losartan  100 mg Oral Daily   metoprolol tartrate  50 mg Oral BID   pantoprazole  40 mg Oral  Daily   revefenacin  175 mcg Nebulization Daily   Continuous Infusions:  sodium chloride 10 mL/hr at 12/01/21 1122   azithromycin 500 mg (12/01/21 1555)   cefTRIAXone (ROCEPHIN)  IV 2 g (12/01/21 1451)   magnesium sulfate bolus IVPB       LOS: 4 days    Time spent: 35 minutes    Chaniah Cisse Darleen Crocker, DO [facility]  If 7PM-7AM, please contact night-coverage [email or web address] 12/02/2021, 8:40 AM

## 2021-12-03 DIAGNOSIS — J189 Pneumonia, unspecified organism: Secondary | ICD-10-CM | POA: Diagnosis not present

## 2021-12-03 LAB — GLUCOSE, CAPILLARY
Glucose-Capillary: 118 mg/dL — ABNORMAL HIGH (ref 70–99)
Glucose-Capillary: 121 mg/dL — ABNORMAL HIGH (ref 70–99)
Glucose-Capillary: 126 mg/dL — ABNORMAL HIGH (ref 70–99)
Glucose-Capillary: 130 mg/dL — ABNORMAL HIGH (ref 70–99)
Glucose-Capillary: 133 mg/dL — ABNORMAL HIGH (ref 70–99)
Glucose-Capillary: 157 mg/dL — ABNORMAL HIGH (ref 70–99)

## 2021-12-03 NOTE — TOC Progression Note (Addendum)
Transition of Care [facility]) - Progression Note    Patient Details  Name: Michaela Moon MRN: [id number] Date of Birth: 24-Jun-1935  [facility] CM/SW Contact  Salome Arnt, [place] Phone Number: 12/03/2021, 3:50 PM  Clinical Narrative: PT evaluation recommending HHPT. Discussed with pt and son who report pt is active with [facility]. Confirmed with Sarah with [facility] that pt is active with PT and RN. Will need resumption orders. TOC will continue to follow.     Update: Pt will need rolling walker. Pt agreeable to refer to [facility]. Referral made to [facility] with [facility]. Requested PT put in order. Gilford Rile will be drop shipped to pt's home.     Barriers to Discharge: Continued Medical Work up  Expected Discharge Plan and Services                                                 Social Determinants of Health (SDOH) Interventions    Readmission Risk Interventions     No data to display

## 2021-12-03 NOTE — Plan of Care (Signed)
  Problem: Acute Rehab PT Goals(only PT should resolve) Goal: Pt Will Go Supine/Side To Sit Outcome: Progressing Flowsheets (Taken 12/03/2021 1612) Pt will go Supine/Side to Sit: with modified independence Goal: Patient Will Transfer Sit To/From Stand Outcome: Progressing Flowsheets (Taken 12/03/2021 1612) Patient will transfer sit to/from stand: with modified independence Goal: Pt Will Transfer Bed To Chair/Chair To Bed Outcome: Progressing Flowsheets (Taken 12/03/2021 1612) Pt will Transfer Bed to Chair/Chair to Bed:  with modified independence  with supervision Goal: Pt Will Ambulate Outcome: Progressing Flowsheets (Taken 12/03/2021 1612) Pt will Ambulate:  50 feet  with modified independence  with supervision  with rolling walker   4:12 PM, 12/03/21 Lonell Grandchild, MPT Physical Therapist with [facility] [phone] office [phone] mobile phone

## 2021-12-03 NOTE — Evaluation (Signed)
Physical Therapy Evaluation Patient Details Name: Michaela Moon MRN: [id number] DOB: 1935/08/31 Today's Date: 12/03/2021  History of Present Illness  Michaela Moon is a 86 y.o. female with medical history significant for COPD, diabetes mellitus, hypertension, dysphagia requiring dilation.  Patient was brought to the ED with low oxygen level, sats in the 80s on home 2 L.     Patient was recently admitted at [facility]- admitted 9/2 to 9/12 for acute respiratory failure, required BiPAP.  She was positive for COVID and treated with antibiotics and Paxlovid for multifocal pneumonia.  Also given a dose of diuretic. Was discharged home on new oxygen requirement of 2 L, and to complete course of antibiotics.     On discharge patient was doing well.  Symptoms improved.  SonCarloyn Manner who is patient's caregiver is at bedside.  On my evaluation patient is sleepy, a bit lethargic, but arouses to voice and follows directions answers questions.  She has chronic difficulty breathing, and they do not think this has changed.  Over the past few days she has had worsening cough.  Prior to today she had good O2 saturations.  She has bilateral lower extremity swelling which they feel is improving compared to prior.  No chest pains.  She has ongoing swallowing problems feeling that food is stuck in her throat/chest.  No NSAID use.  No black stools no blood in stools no vomiting.   Clinical Impression  Patient requires increased and labored movement for sitting up at bedside requiring use of bed rail with HOB flat, has to lean on armrest of chair during transfers without AD, safer using RW and demonstrates good return for using during transfers to/from commode in bathroom and ambulating in room without loss of balance.  Patient tolerated sitting up in chair after therapy with family members present in room.  Patient will benefit from continued skilled physical therapy in hospital and recommended venue below to increase  strength, balance, endurance for safe ADLs and gait.         Recommendations for follow up therapy are one component of a multi-disciplinary discharge planning process, led by the attending physician.  Recommendations may be updated based on patient status, additional functional criteria and insurance authorization.  Follow Up Recommendations Home health PT      Assistance Recommended at Discharge Set up Supervision/Assistance  Patient can return home with the following  A little help with walking and/or transfers;A little help with bathing/dressing/bathroom;Help with stairs or ramp for entrance;Assistance with cooking/housework    Equipment Recommendations Rolling walker (2 wheels)  Recommendations for Other Services       Functional Status Assessment Patient has had a recent decline in their functional status and demonstrates the ability to make significant improvements in function in a reasonable and predictable amount of time.     Precautions / Restrictions Precautions Precautions: Fall Restrictions Weight Bearing Restrictions: No      Mobility  Bed Mobility Overal bed mobility: Needs Assistance Bed Mobility: Supine to Sit     Supine to sit: Supervision     General bed mobility comments: increased time, labored movement requiring use of bed rail    Transfers Overall transfer level: Needs assistance Equipment used: Rolling walker (2 wheels) Transfers: Sit to/from Stand, Bed to chair/wheelchair/BSC Sit to Stand: Supervision   Step pivot transfers: Supervision       General transfer comment: slightly labored movement, increased time    Ambulation/Gait Ambulation/Gait assistance: Supervision Gait Distance (Feet): 22 Feet Assistive  device: Rolling walker (2 wheels) Gait Pattern/deviations: Decreased step length - right, Decreased step length - left, Decreased stride length Gait velocity: decreased     General Gait Details: slow labored cadence without loss  of balance, limited mostly due to c/o fatigue, on 2 LPM with SpO2 at 90-91%  Stairs            Wheelchair Mobility    Modified Rankin (Stroke Patients Only)       Balance Overall balance assessment: Needs assistance Sitting-balance support: Feet supported, No upper extremity supported Sitting balance-Leahy Scale: Good Sitting balance - Comments: seated at EOB   Standing balance support: During functional activity, No upper extremity supported Standing balance-Leahy Scale: Fair Standing balance comment: fair/poor without AD, fair/good using RW                             Pertinent Vitals/Pain Pain Assessment Pain Assessment: No/denies pain    Home Living Family/patient expects to be discharged to:: Private residence Living Arrangements: Children Available Help at Discharge: Family;Available 24 hours/day Type of Home: Mobile home Home Access: Ramped entrance (bilateral side rails, can reach both)         Home Equipment: Standard Walker;BSC/3in1;Wheelchair - manual      Prior Function Prior Level of Function : Needs assist       Physical Assist : Mobility (physical);ADLs (physical) Mobility (physical): Bed mobility;Transfers;Gait;Stairs   Mobility Comments: household and very short distanced community using standard walker PRN ADLs Comments: assisted by family     Hand Dominance        Extremity/Trunk Assessment   Upper Extremity Assessment Upper Extremity Assessment: Overall WFL for tasks assessed    Lower Extremity Assessment Lower Extremity Assessment: Generalized weakness    Cervical / Trunk Assessment Cervical / Trunk Assessment: Kyphotic  Communication   Communication: No difficulties  Cognition Arousal/Alertness: Awake/alert Behavior During Therapy: WFL for tasks assessed/performed Overall Cognitive Status: Within Functional Limits for tasks assessed                                          General  Comments      Exercises     Assessment/Plan    PT Assessment Patient needs continued PT services  PT Problem List Decreased strength;Decreased activity tolerance;Decreased balance;Decreased mobility       PT Treatment Interventions DME instruction;Gait training;Stair training;Functional mobility training;Therapeutic activities;Therapeutic exercise;Patient/family education;Balance training    PT Goals (Current goals can be found in the Care Plan section)  Acute Rehab PT Goals Patient Stated Goal: return home with family to assist PT Goal Formulation: With patient/family Time For Goal Achievement: 12/07/21 Potential to Achieve Goals: Good    Frequency Min 3X/week     Co-evaluation               AM-PAC PT "6 Clicks" Mobility  Outcome Measure Help needed turning from your back to your side while in a flat bed without using bedrails?: None Help needed moving from lying on your back to sitting on the side of a flat bed without using bedrails?: A Little Help needed moving to and from a bed to a chair (including a wheelchair)?: A Little Help needed standing up from a chair using your arms (e.g., wheelchair or bedside chair)?: None Help needed to walk in hospital room?: A Little Help needed climbing  3-5 steps with a railing? : A Lot 6 Click Score: 19    End of Session Equipment Utilized During Treatment: Oxygen Activity Tolerance: Patient tolerated treatment well;Patient limited by fatigue Patient left: in chair;with call bell/phone within reach Nurse Communication: Mobility status PT Visit Diagnosis: Unsteadiness on feet (R26.81);Other abnormalities of gait and mobility (R26.89);Muscle weakness (generalized) (M62.81)    Time: 8177-1165 PT Time Calculation (min) (ACUTE ONLY): 29 min   Charges:   PT Evaluation $PT Eval Moderate Complexity: 1 Mod PT Treatments $Therapeutic Activity: 23-37 mins        4:11 PM, 12/03/21 Lonell Grandchild, MPT Physical Therapist with  [facility] 336 [phone] office [phone] mobile phone

## 2021-12-03 NOTE — Progress Notes (Signed)
PROGRESS NOTE    SHAWNI VOLKOV  HEN:[id number] DOB: 09-Aug-1935 DOA: 11/28/2021 PCP: [name], MD   Brief Narrative:    Michaela Moon is a 86 y.o. female with medical history significant for COPD, diabetes mellitus, hypertension, dysphagia requiring dilation.  Patient was brought to the ED with low oxygen level, sats in the 80s on home 2 L.  She was admitted with acute on chronic hypoxemic respiratory failure secondary to multifocal pneumonia and has been started on IV Rocephin and azithromycin.  Pulmonology following.  Assessment & Plan:   Principal Problem:   Multifocal pneumonia Active Problems:   Acute on chronic respiratory failure with hypoxia (HCC)   AKI (acute kidney injury) ([place])   Dysphagia   Hypertension   Diabetes mellitus without complication (HCC)   COPD (chronic obstructive pulmonary disease) (HCC)   Lung nodule   Pressure injury of skin   Acute respiratory failure with hypoxemia (HCC)   Recurrent pneumonia  Assessment and Plan:   Multifocal pneumonia Pneumonia with acute on chronic respiratory failure.  Rules out for sepsis.  Tmax 99.6.  WBC 10.9.  Heart rate 93-107.  Not tachypneic.  ABG shows pH of 7.4, PCO2 of 42.  Chronic dysphagia.  CT confirms multifocal dependent consolidations, possibly aspiration.  No PE.  Recent hospitalization at [facility] for same. -Monitor respiration closely -Currently on high flow nasal cannula with sats 92 to 100% -Continue IV ceftriaxone and azithromycin d6/7 -Appreciate ongoing PCCM evaluation -Okay to transfer to telemetry today   Acute on chronic respiratory failure with hypoxia (HCC) O2 sats down to 79% on 3 L.  Currently on high flow nasal cannula sats 92 to 100%.  ABG shows pH of 7.4, PCO2 of 42, PO2 of 58.  Likely secondary to multifocal pneumonia, possible component of COPD exacerbation.   Iron deficiency anemia-stable Transfuse for hemoglobin less than 7  Started on Feraheme 10/6   AKI (acute kidney  injury) (HCC)-resolved Creatinine elevated 1.4.  Baseline 0.7-0.8.  Has 2+ pitting edema bilateral lower extremity, this is chronic. -Continue to monitor BMP -Especially with diuresis   Dysphagia Last EGD 04/2016 by Dr. Oneida Alar - no endoscopic esophageal abnormality to explain patient's dysphagia. Esophagus dilated. -Appreciate SLP evaluation, patient now on dysphagia 3 diet   Lung nodule Incidental finding - the 1 cm nodule in the anterior right upper lobe appears subsolid on today's exam. As previously noted, bronchogenic carcinoma cannot be excluded. Recommend attention on follow-up imaging. -Follow-up as outpatient   COPD (chronic obstructive pulmonary disease) (HCC) Possible component of COPD exacerbation.  Diffuse rattling in chest.  Likely from secretions. -DuoNebs as needed and scheduled -Hold off on steroids for now   Diabetes mellitus without complication ([place]) Controlled.  A1c 6.1. - SSI- S q6h -Hold metformin, Januvia, Lantus 20 units twice daily, glimepiride   Hypertension -Now elevated, resume home losartan and metoprolol -As needed labetalol IV 10 mg -IV Lasix given 10/5   Hypomagnesemia -Replete and reevaluate in a.m.   Obesity -BMI 30.67     DVT prophylaxis:Heparin Code Status: Full Family Communication: None, tried calling son with no response 10/6 Disposition Plan:  Status is: Inpatient Remains inpatient appropriate because: High oxygen requirements and IV medications.   Skin Assessment:   I have examined the patient's skin and I agree with the wound assessment as performed by the wound care RN as outlined below:          Pressure Injury 11/28/21 Sacrum Bilateral Stage 1 -  Intact  skin with non-blanchable redness of a localized area usually over a bony prominence. (Active)  11/28/21 1800  Location: Sacrum  Location Orientation: Bilateral  Staging: Stage 1 -  Intact skin with non-blanchable redness of a localized area usually over a bony prominence.   Wound Description (Comments):   Present on Admission: Yes  Dressing Type Foam - Lift dressing to assess site every shift 11/29/21 0830      Consultants:  PCCM   Procedures:  None   Antimicrobials:  Anti-infectives (From admission, onward)    Start     Dose/Rate Route Frequency Ordered Stop   11/29/21 1500  cefTRIAXone (ROCEPHIN) 2 g in sodium chloride 0.9 % 100 mL IVPB        2 g 200 mL/hr over 30 Minutes Intravenous Every 24 hours 11/28/21 1858 12/04/21 1459   11/29/21 1500  azithromycin (ZITHROMAX) 500 mg in sodium chloride 0.9 % 250 mL IVPB        500 mg 250 mL/hr over 60 Minutes Intravenous Every 24 hours 11/28/21 1858 12/04/21 1459   11/28/21 1515  cefTRIAXone (ROCEPHIN) 2 g in sodium chloride 0.9 % 100 mL IVPB        2 g 200 mL/hr over 30 Minutes Intravenous  Once 11/28/21 1500 11/28/21 1623   11/28/21 1515  azithromycin (ZITHROMAX) tablet 500 mg        500 mg Oral  Once 11/28/21 1500 11/28/21 1548      Subjective: Patient seen and evaluated today with no new acute complaints or concerns. No acute concerns or events noted overnight.  She is anxious to go home soon.  Objective: Vitals:   12/03/21 0400 12/03/21 0600 12/03/21 0756 12/03/21 0817  BP: (!) 165/50 (!) 144/58    Pulse: 64 68    Resp: (!) 21 15    Temp: 97.9 F (36.6 C)   98.2 F (36.8 C)  TempSrc: Oral   Oral  SpO2:  99% 93%   Weight:      Height:        Intake/Output Summary (Last 24 hours) at 12/03/2021 0929 Last data filed at 12/02/2021 1721 Gross per 24 hour  Intake 803.44 ml  Output --  Net 803.44 ml   Filed Weights   11/30/21 0429 12/01/21 0400 12/02/21 0436  Weight: 81.6 kg 82.4 kg 81.1 kg    Examination:  General exam: Appears calm and comfortable  Respiratory system: Bilateral rales. Respiratory effort normal.  Currently on 9 L nasal cannula Cardiovascular system: S1 & S2 heard, RRR.  Gastrointestinal system: Abdomen is soft Central nervous system: Alert and awake Extremities:  No edema Skin: No significant lesions noted Psychiatry: Flat affect.    Data Reviewed: I have personally reviewed following labs and imaging studies  CBC: Recent Labs  Lab 11/28/21 1236 11/29/21 0251 11/30/21 0335 12/01/21 0500 12/02/21 0434  WBC 10.9* 10.2 9.3 7.2 8.5  HGB 8.4* 8.4* 8.0* 7.6* 8.0*  HCT 28.3* 29.0* 26.6* 25.0* 26.9*  MCV 78.6* 78.6* 77.8* 76.9* 78.2*  PLT 304 299 289 285 237   Basic Metabolic Panel: Recent Labs  Lab 11/28/21 1236 11/29/21 0251 11/30/21 0335 12/01/21 0500 12/02/21 0434  NA 140 142 141 141 140  K 3.9 3.7 3.7 3.4* 4.0  CL 106 106 104 105 108  CO2 '25 25 28 28 25  '$ GLUCOSE 106* 96 136* 136* 137*  BUN 27* '18 15 12 10  '$ CREATININE 1.40* 0.87 0.73 0.65 0.60  CALCIUM 9.2 9.4 9.9 9.6 9.8  MG  --   --  1.2* 1.7 1.6*   GFR: Estimated Creatinine Clearance: 55 mL/min (by C-G formula based on SCr of 0.6 mg/dL). Liver Function Tests: Recent Labs  Lab 11/28/21 1236  AST 17  ALT 14  ALKPHOS 38  BILITOT 0.5  PROT 6.8  ALBUMIN 2.8*   No results for input(s): "LIPASE", "AMYLASE" in the last 168 hours. No results for input(s): "AMMONIA" in the last 168 hours. Coagulation Profile: No results for input(s): "INR", "PROTIME" in the last 168 hours. Cardiac Enzymes: No results for input(s): "CKTOTAL", "CKMB", "CKMBINDEX", "TROPONINI" in the last 168 hours. BNP (last 3 results) No results for input(s): "PROBNP" in the last 8760 hours. HbA1C: No results for input(s): "HGBA1C" in the last 72 hours. CBG: Recent Labs  Lab 12/02/21 1608 12/02/21 2052 12/03/21 0001 12/03/21 0502 12/03/21 0744  GLUCAP 110* 123* 118* 130* 133*   Lipid Profile: No results for input(s): "CHOL", "HDL", "LDLCALC", "TRIG", "CHOLHDL", "LDLDIRECT" in the last 72 hours. Thyroid Function Tests: No results for input(s): "TSH", "T4TOTAL", "FREET4", "T3FREE", "THYROIDAB" in the last 72 hours. Anemia Panel: No results for input(s): "VITAMINB12", "FOLATE", "FERRITIN",  "TIBC", "IRON", "RETICCTPCT" in the last 72 hours. Sepsis Labs: No results for input(s): "PROCALCITON", "LATICACIDVEN" in the last 168 hours.  Recent Results (from the past 240 hour(s))  Resp Panel by RT-PCR (Flu A&B, Covid) Anterior Nasal Swab     Status: None   Collection Time: 11/28/21  1:00 PM   Specimen: Anterior Nasal Swab  Result Value Ref Range Status   SARS Coronavirus 2 by RT PCR NEGATIVE NEGATIVE Final    Comment: (NOTE) SARS-CoV-2 target nucleic acids are NOT DETECTED.  The SARS-CoV-2 RNA is generally detectable in upper respiratory specimens during the acute phase of infection. The lowest concentration of SARS-CoV-2 viral copies this assay can detect is 138 copies/mL. A negative result does not preclude SARS-Cov-2 infection and should not be used as the sole basis for treatment or other patient management decisions. A negative result may occur with  improper specimen collection/handling, submission of specimen other than nasopharyngeal swab, presence of viral mutation(s) within the areas targeted by this assay, and inadequate number of viral copies(<138 copies/mL). A negative result must be combined with clinical observations, patient history, and epidemiological information. The expected result is Negative.  Fact Sheet for Patients:  EntrepreneurPulse.com.au  Fact Sheet for Healthcare Providers:  IncredibleEmployment.be  This test is no t yet approved or cleared by the [place] FDA and  has been authorized for detection and/or diagnosis of SARS-CoV-2 by FDA under an Emergency Use Authorization (EUA). This EUA will remain  in effect (meaning this test can be used) for the duration of the COVID-19 declaration under Section 564(b)(1) of the Act, 21 U.S.C.section 360bbb-3(b)(1), unless the authorization is terminated  or revoked sooner.       Influenza A by PCR NEGATIVE NEGATIVE Final   Influenza B by PCR NEGATIVE NEGATIVE  Final    Comment: (NOTE) The Xpert Xpress SARS-CoV-2/FLU/RSV plus assay is intended as an aid in the diagnosis of influenza from Nasopharyngeal swab specimens and should not be used as a sole basis for treatment. Nasal washings and aspirates are unacceptable for Xpert Xpress SARS-CoV-2/FLU/RSV testing.  Fact Sheet for Patients: EntrepreneurPulse.com.au  Fact Sheet for Healthcare Providers: IncredibleEmployment.be  This test is not yet approved or cleared by the [place] FDA and has been authorized for detection and/or diagnosis of SARS-CoV-2 by FDA under an Emergency Use Authorization (EUA). This EUA will remain in effect (meaning this test  can be used) for the duration of the COVID-19 declaration under Section 564(b)(1) of the Act, 21 U.S.C. section 360bbb-3(b)(1), unless the authorization is terminated or revoked.  Performed at [facility], [address], [place], [place] [address]   MRSA Next Gen by PCR, Nasal     Status: None   Collection Time: 11/28/21  5:58 PM   Specimen: Nasal Mucosa; Nasal Swab  Result Value Ref Range Status   MRSA by PCR Next Gen NOT DETECTED NOT DETECTED Final    Comment: (NOTE) The GeneXpert MRSA Assay (FDA approved for NASAL specimens only), is one component of a comprehensive MRSA colonization surveillance program. It is not intended to diagnose MRSA infection nor to guide or monitor treatment for MRSA infections. Test performance is not FDA approved in patients less than 26 years old. Performed at [facility], [address], [place], [place] [address]          Radiology Studies: No results found.      Scheduled Meds:  arformoterol  15 mcg Nebulization BID   budesonide (PULMICORT) nebulizer solution  0.5 mg Nebulization BID   Chlorhexidine Gluconate Cloth  6 each Topical Q0600   guaiFENesin  1,200 mg Oral BID   heparin  5,000 Units Subcutaneous Q8H   insulin aspart  0-9 Units Subcutaneous  Q4H   losartan  100 mg Oral Daily   metoprolol tartrate  50 mg Oral BID   pantoprazole  40 mg Oral Daily   revefenacin  175 mcg Nebulization Daily   Continuous Infusions:  sodium chloride 5 mL/hr at 12/02/21 1721   azithromycin Stopped (12/02/21 1708)   cefTRIAXone (ROCEPHIN)  IV Stopped (12/02/21 1503)     LOS: 5 days    Time spent: 35 minutes    Kaylenn Civil Darleen Crocker, DO [facility]  If 7PM-7AM, please contact night-coverage [email or web address] 12/03/2021, 9:29 AM

## 2021-12-04 DIAGNOSIS — J189 Pneumonia, unspecified organism: Secondary | ICD-10-CM | POA: Diagnosis not present

## 2021-12-04 LAB — BASIC METABOLIC PANEL
Anion gap: 7 (ref 5–15)
BUN: 9 mg/dL (ref 8–23)
CO2: 26 mmol/L (ref 22–32)
Calcium: 10.3 mg/dL (ref 8.9–10.3)
Chloride: 106 mmol/L (ref 98–111)
Creatinine, Ser: 0.65 mg/dL (ref 0.44–1.00)
GFR, Estimated: >60 mL/min (ref >60)
Glucose, Bld: 139 mg/dL — ABNORMAL HIGH (ref 70–99)
Potassium: 4.1 mmol/L (ref 3.5–5.1)
Sodium: 139 mmol/L (ref 135–145)

## 2021-12-04 LAB — MAGNESIUM: Magnesium: 1.6 mg/dL — ABNORMAL LOW (ref 1.7–2.4)

## 2021-12-04 LAB — CBC
HCT: 28.9 % — ABNORMAL LOW (ref 36.0–46.0)
Hemoglobin: 8.5 g/dL — ABNORMAL LOW (ref 12.0–15.0)
MCH: 23.6 pg — ABNORMAL LOW (ref 26.0–34.0)
MCHC: 29.4 g/dL — ABNORMAL LOW (ref 30.0–36.0)
MCV: 80.3 fL (ref 80.0–100.0)
Platelets: 266 10*3/uL (ref 150–400)
RBC: 3.6 MIL/uL — ABNORMAL LOW (ref 3.87–5.11)
RDW: 23.5 % — ABNORMAL HIGH (ref 11.5–15.5)
WBC: 11.4 10*3/uL — ABNORMAL HIGH (ref 4.0–10.5)
nRBC: 0.6 % — ABNORMAL HIGH (ref 0.0–0.2)

## 2021-12-04 LAB — GLUCOSE, CAPILLARY
Glucose-Capillary: 120 mg/dL — ABNORMAL HIGH (ref 70–99)
Glucose-Capillary: 142 mg/dL — ABNORMAL HIGH (ref 70–99)
Glucose-Capillary: 145 mg/dL — ABNORMAL HIGH (ref 70–99)

## 2021-12-04 MED ORDER — DOCUSATE SODIUM 100 MG PO CAPS: 100.0000 mg | ORAL_CAPSULE | Freq: Every day | ORAL | 2 refills | Status: DC

## 2021-12-04 MED ORDER — FERROUS SULFATE 325 (65 FE) MG PO TABS: 325.0000 mg | ORAL_TABLET | Freq: Every day | ORAL | 3 refills | Status: AC

## 2021-12-04 MED ORDER — MAGNESIUM SULFATE 2 GM/50ML IV SOLN
2.0000 g | Freq: Once | INTRAVENOUS | Status: AC
  Administered 2021-12-04: 2 g via INTRAVENOUS
  Filled 2021-12-04: qty 50

## 2021-12-04 NOTE — Discharge Summary (Signed)
Physician Discharge Summary  Michaela Moon FWY:[id number] DOB: 02-19-1936 DOA: 11/28/2021  PCP: [name], MD  Admit date: 11/28/2021  Discharge date: 12/04/2021  Admitted From:Home  Disposition:  Home  Recommendations for Outpatient Follow-up:  Follow up with PCP in 1-2 weeks Follow-up with Dr. Halford Chessman as scheduled 11/27 Follow-up imaging for lung nodule in 3 months Continue home medications as prior and patient has completed course of antibiotics Given prescription for ferrous sulfate and Colace given iron deficiency anemia noted during hospitalization  Home Health: Yes with RN/PT  Equipment/Devices: Has home oxygen, rolling walker  Discharge Condition:Stable  CODE STATUS: Full  Diet recommendation: Dysphagia 3 diet  Brief/Interim Summary:  Michaela Moon is a 86 y.o. female with medical history significant for COPD, diabetes mellitus, hypertension, dysphagia requiring dilation.  Patient was brought to the ED with low oxygen level, sats in the 80s on home 2 L.  She was admitted with acute on chronic hypoxemic respiratory failure secondary to multifocal pneumonia and had been started on IV Rocephin and azithromycin.  She has completed the course of antibiotics during her prolonged stay and has been able to wean down to 4-5 L nasal cannula oxygen.  She does have home oxygen supply at home and has been assessed by PT with recommendations for continuation of home health PT/RN services.  No other acute events noted during the course of the stay and she was seen by her pulmonologist with plans to follow-up outpatient in the next month.  She has home nebulizer treatments as needed and has had no other acute events noted during the course of this hospitalization.  Discharge Diagnoses:  Principal Problem:   Multifocal pneumonia Active Problems:   Acute on chronic respiratory failure with hypoxia (HCC)   AKI (acute kidney injury) ([place])   Dysphagia   Hypertension   Diabetes mellitus  without complication (HCC)   COPD (chronic obstructive pulmonary disease) (HCC)   Lung nodule   Pressure injury of skin   Acute respiratory failure with hypoxemia (HCC)   Recurrent pneumonia  Principal discharge diagnosis: Acute on chronic hypoxemic respiratory failure due to multifocal pneumonia with concern for aspiration in the setting of COPD.  Iron deficiency anemia.  Discharge Instructions  Discharge Instructions     Diet - low sodium heart healthy   Complete by: As directed    Increase activity slowly   Complete by: As directed    No wound care   Complete by: As directed       Allergies as of 12/04/2021       Reactions   Onion    wheezing        Medication List     TAKE these medications    albuterol (2.5 MG/3ML) 0.083% nebulizer solution Commonly known as: PROVENTIL Take 3 mLs (2.5 mg total) by nebulization every 6 (six) hours as needed for wheezing or shortness of breath.   albuterol-ipratropium 18-103 MCG/ACT inhaler Commonly known as: COMBIVENT Inhale 2 puffs into the lungs every 6 (six) hours as needed for wheezing.   Ipratropium-Albuterol 20-100 MCG/ACT Aers respimat Commonly known as: COMBIVENT Inhale 1 puff into the lungs every 6 (six) hours.   ARTIFICIAL TEARS OP Place 1 drop into both eyes 4 (four) times daily.   aspirin 81 MG tablet Take 81 mg by mouth daily.   atorvastatin 80 MG tablet Commonly known as: LIPITOR ALTERNATE TAKING 1 TABLET EVERY OTHER DAY WITH 1/2 TABLET EVERY OTHERDAY   Azelastine HCl 0.15 % Soln Commonly  known as: Astepro Place 1 spray into the nose at bedtime.   Breztri Aerosphere 160-9-4.8 MCG/ACT Aero Generic drug: Budeson-Glycopyrrol-Formoterol Inhale 2 puffs into the lungs in the morning and at bedtime.   docusate sodium 100 MG capsule Commonly known as: Colace Take 1 capsule (100 mg total) by mouth daily.   ferrous sulfate 325 (65 FE) MG tablet Take 1 tablet (325 mg total) by mouth daily.   Fish Oil  1000 MG Caps Take 1,000 mg by mouth in the morning, at noon, and at bedtime.   fluticasone 50 MCG/ACT nasal spray Commonly known as: FLONASE Place 1 spray into both nostrils daily.   gabapentin 400 MG capsule Commonly known as: NEURONTIN Take 400 mg by mouth 3 (three) times daily.   glimepiride 2 MG tablet Commonly known as: AMARYL Take 4 mg by mouth daily with breakfast.   guaiFENesin 600 MG 12 hr tablet Commonly known as: Mucinex Take 2 tablets (1,200 mg total) by mouth 2 (two) times daily as needed for cough or to loosen phlegm.   HAIR/SKIN/NAILS PO Take 1 tablet by mouth daily. Takes once a day.   ipratropium 0.03 % nasal spray Commonly known as: ATROVENT Place 2 sprays into both nostrils every 12 (twelve) hours.   Lantus SoloStar 100 UNIT/ML Solostar Pen Generic drug: insulin glargine Inject 20 Units into the skin in the morning and at bedtime.   losartan-hydrochlorothiazide 100-12.5 MG tablet Commonly known as: HYZAAR TAKE ONE (1) TABLET EACH DAY What changed: See the new instructions.   metoprolol tartrate 50 MG tablet Commonly known as: LOPRESSOR Take 50 mg by mouth 2 (two) times daily.   montelukast 10 MG tablet Commonly known as: SINGULAIR TAKE ONE TABLET BY MOUTH AT BEDTIME   oxybutynin 5 MG tablet Commonly known as: DITROPAN Take 5 mg by mouth every 8 (eight) hours as needed for bladder spasms.   pantoprazole 40 MG tablet Commonly known as: PROTONIX Take 40 mg by mouth daily.   sitaGLIPtin-metformin 50-1000 MG tablet Commonly known as: JANUMET Take 1 tablet by mouth 2 (two) times daily with a meal.   triamcinolone cream 0.1 % Commonly known as: KENALOG Apply 1 application topically daily as needed (eczema).               Durable Medical Equipment  (From admission, onward)           Start     Ordered   12/03/21 1615  For home use only DME Walker rolling  Once       Comments: Patient unsteady having to lean on nearby objects for  support without AD, safer using RW with good return demonstrated for use  Question Answer Comment  Walker: With [place]   Patient needs a walker to treat with the following condition Gait difficulty      12/03/21 1615            Follow-up Information     Hasanaj, Samul Dada, MD. Schedule an appointment as soon as possible for a visit in 1 week(s).   Specialty: Internal Medicine Contact information: [address] [place] [address] [phone]         Chesley Mires, MD Follow up on 01/21/2022.   Specialty: Pulmonary Disease Contact information: [address] [place] [address] [phone]                Allergies  Allergen Reactions   Onion     wheezing  Consultations: Pulmonology   Procedures/Studies: DG Chest 1 View  Result Date: 11/29/2021 CLINICAL DATA:  Shortness of breath EXAM: CHEST  1 VIEW COMPARISON:  Chest x-ray 11/28/2021. FINDINGS: Cardiomediastinal silhouette is within normal limits. There is increasing patchy airspace disease in the bilateral mid and lower lungs. There is no pleural effusion or pneumothorax. No acute fractures are seen. There is a healed left humeral neck fracture. IMPRESSION: Increasing patchy airspace disease in the bilateral mid and lower lungs, concerning for multifocal pneumonia. Electronically Signed   By: Ronney Asters M.D.   On: 11/29/2021 20:36   CT Angio Chest PE W and/or Wo Contrast  Result Date: 11/28/2021 CLINICAL DATA:  PE suspected.  Low O2 sats. EXAM: CT ANGIOGRAPHY CHEST WITH CONTRAST TECHNIQUE: Multidetector CT imaging of the chest was performed using the standard protocol during bolus administration of intravenous contrast. Multiplanar CT image reconstructions and MIPs were obtained to evaluate the vascular anatomy. RADIATION DOSE REDUCTION: This exam was performed according to the departmental dose-optimization program which includes automated exposure control, adjustment of the mA  and/or kV according to patient size and/or use of iterative reconstruction technique. CONTRAST:  47m OMNIPAQUE IOHEXOL 350 MG/ML SOLN COMPARISON:  CT chest 09/20/2021 FINDINGS: Cardiovascular: Satisfactory opacification of the pulmonary arteries to the segmental level. No evidence of pulmonary embolism. Normal heart size. No pericardial effusion. Calcific aortic atherosclerosis. Multivessel coronary artery calcification. Mediastinum/Nodes: No enlarged mediastinal, hilar, or axillary lymph nodes. Thyroid gland and trachea demonstrate no significant findings. Patulous esophagus. Lungs/Pleura: A 1.0 x 0.8 cm irregular nodule in the anterior right upper lobe appears subsolid on today's exam (series 6, image 57). Irregular consolidation with air bronchograms and surrounding ground-glass opacities dependently located in the left upper lobe is not significantly changed compared to 09/20/2021. Airspace consolidation with air bronchograms dependently located in the right upper and right middle lobes with adjacent ground-glass opacities are increased compared to 09/20/2021. Similarly, patchy consolidations with surrounding ground-glass opacities in the lower lungs are also increased compared to 09/16/2021, particularly in the right lower lobe. No pleural effusion or pneumothorax. Upper Abdomen: No acute abnormality. Benign left hepatic cyst again noted. No follow-up imaging indicated. Musculoskeletal: No chest wall abnormality. No acute or suspicious osseous findings. Multilevel degenerative changes in the thoracic and visualized upper lumbar spine. Review of the MIP images confirms the above findings. IMPRESSION: 1. No pulmonary embolism. 2. Multifocal, dependently located, consolidations bilaterally, favored to represent multifocal pneumonia, possibly aspiration related. Recommend follow-up CT imaging in 3 months after treatment to ensure resolution. 3. The 1 cm nodule in the anterior right upper lobe appears subsolid on  today's exam. As previously noted, bronchogenic carcinoma cannot be excluded. Recommend attention on follow-up imaging. 4. Multivessel coronary artery disease. Aortic Atherosclerosis (ICD10-I70.0). Electronically Signed   By: LIleana RoupM.D.   On: 11/28/2021 16:15   DG Chest Port 1 View  Result Date: 11/28/2021 CLINICAL DATA:  Dyspnea, shaking, hypoxia, history COPD, asthma, diabetes mellitus, hypertension EXAM: PORTABLE CHEST 1 VIEW COMPARISON:  Portable exam 1304 hours compared to 10/31/2021 FINDINGS: Upper normal size of cardiac silhouette. Mediastinal contours and pulmonary vascularity normal. Atherosclerotic calcification aorta. Bibasilar infiltrates question multifocal pneumonia. Minimal pleural effusions. No pneumothorax. Bones demineralized with posttraumatic deformity proximal LEFT humerus and multilevel degenerative disc disease changes/scoliosis thoracic spine. IMPRESSION: Bibasilar pulmonary infiltrates question multifocal pneumonia. Minimal pleural effusions. Aortic Atherosclerosis (ICD10-I70.0). Electronically Signed   By: MLavonia DanaM.D.   On: 11/28/2021 13:15     Discharge Exam: Vitals:   12/04/21  0452 12/04/21 0708  BP: (!) 155/65   Pulse: 68   Resp: 20   Temp: 98.8 F (37.1 C)   SpO2: 93% 96%   Vitals:   12/03/21 2022 12/04/21 0046 12/04/21 0452 12/04/21 0708  BP:  (!) 151/60 (!) 155/65   Pulse:  63 68   Resp:  20 20   Temp:  98.7 F (37.1 C) 98.8 F (37.1 C)   TempSrc:  Oral Oral   SpO2: 93% 94% 93% 96%  Weight:      Height:        General: Pt is alert, awake, not in acute distress Cardiovascular: RRR, S1/S2 +, no rubs, no gallops Respiratory: CTA bilaterally, no wheezing, no rhonchi, 4 L nasal cannula oxygen Abdominal: Soft, NT, ND, bowel sounds + Extremities: no edema, no cyanosis    The results of significant diagnostics from this hospitalization (including imaging, microbiology, ancillary and laboratory) are listed below for reference.      Microbiology: Recent Results (from the past 240 hour(s))  Resp Panel by RT-PCR (Flu A&B, Covid) Anterior Nasal Swab     Status: None   Collection Time: 11/28/21  1:00 PM   Specimen: Anterior Nasal Swab  Result Value Ref Range Status   SARS Coronavirus 2 by RT PCR NEGATIVE NEGATIVE Final    Comment: (NOTE) SARS-CoV-2 target nucleic acids are NOT DETECTED.  The SARS-CoV-2 RNA is generally detectable in upper respiratory specimens during the acute phase of infection. The lowest concentration of SARS-CoV-2 viral copies this assay can detect is 138 copies/mL. A negative result does not preclude SARS-Cov-2 infection and should not be used as the sole basis for treatment or other patient management decisions. A negative result may occur with  improper specimen collection/handling, submission of specimen other than nasopharyngeal swab, presence of viral mutation(s) within the areas targeted by this assay, and inadequate number of viral copies(<138 copies/mL). A negative result must be combined with clinical observations, patient history, and epidemiological information. The expected result is Negative.  Fact Sheet for Patients:  EntrepreneurPulse.com.au  Fact Sheet for Healthcare Providers:  IncredibleEmployment.be  This test is no t yet approved or cleared by the [place] FDA and  has been authorized for detection and/or diagnosis of SARS-CoV-2 by FDA under an Emergency Use Authorization (EUA). This EUA will remain  in effect (meaning this test can be used) for the duration of the COVID-19 declaration under Section 564(b)(1) of the Act, 21 U.S.C.section 360bbb-3(b)(1), unless the authorization is terminated  or revoked sooner.       Influenza A by PCR NEGATIVE NEGATIVE Final   Influenza B by PCR NEGATIVE NEGATIVE Final    Comment: (NOTE) The Xpert Xpress SARS-CoV-2/FLU/RSV plus assay is intended as an aid in the diagnosis of influenza  from Nasopharyngeal swab specimens and should not be used as a sole basis for treatment. Nasal washings and aspirates are unacceptable for Xpert Xpress SARS-CoV-2/FLU/RSV testing.  Fact Sheet for Patients: EntrepreneurPulse.com.au  Fact Sheet for Healthcare Providers: IncredibleEmployment.be  This test is not yet approved or cleared by the [place] FDA and has been authorized for detection and/or diagnosis of SARS-CoV-2 by FDA under an Emergency Use Authorization (EUA). This EUA will remain in effect (meaning this test can be used) for the duration of the COVID-19 declaration under Section 564(b)(1) of the Act, 21 U.S.C. section 360bbb-3(b)(1), unless the authorization is terminated or revoked.  Performed at [facility], [address], [place], [place] [address]   MRSA Next Gen by  PCR, Nasal     Status: None   Collection Time: 11/28/21  5:58 PM   Specimen: Nasal Mucosa; Nasal Swab  Result Value Ref Range Status   MRSA by PCR Next Gen NOT DETECTED NOT DETECTED Final    Comment: (NOTE) The GeneXpert MRSA Assay (FDA approved for NASAL specimens only), is one component of a comprehensive MRSA colonization surveillance program. It is not intended to diagnose MRSA infection nor to guide or monitor treatment for MRSA infections. Test performance is not FDA approved in patients less than 48 years old. Performed at [facility], [address], [place], [place] [address]      Labs: BNP (last 3 results) Recent Labs    11/28/21 1540  BNP 716.9*   Basic Metabolic Panel: Recent Labs  Lab 11/29/21 0251 11/30/21 0335 12/01/21 0500 12/02/21 0434 12/04/21 0430  NA 142 141 141 140 139  K 3.7 3.7 3.4* 4.0 4.1  CL 106 104 105 108 106  CO2 '25 28 28 25 26  '$ GLUCOSE 96 136* 136* 137* 139*  BUN '18 15 12 10 9  '$ CREATININE 0.87 0.73 0.65 0.60 0.65  CALCIUM 9.4 9.9 9.6 9.8 10.3  MG  --  1.2* 1.7 1.6* 1.6*   Liver Function Tests: Recent Labs   Lab 11/28/21 1236  AST 17  ALT 14  ALKPHOS 38  BILITOT 0.5  PROT 6.8  ALBUMIN 2.8*   No results for input(s): "LIPASE", "AMYLASE" in the last 168 hours. No results for input(s): "AMMONIA" in the last 168 hours. CBC: Recent Labs  Lab 11/29/21 0251 11/30/21 0335 12/01/21 0500 12/02/21 0434 12/04/21 0430  WBC 10.2 9.3 7.2 8.5 11.4*  HGB 8.4* 8.0* 7.6* 8.0* 8.5*  HCT 29.0* 26.6* 25.0* 26.9* 28.9*  MCV 78.6* 77.8* 76.9* 78.2* 80.3  PLT 299 289 285 276 266   Cardiac Enzymes: No results for input(s): "CKTOTAL", "CKMB", "CKMBINDEX", "TROPONINI" in the last 168 hours. BNP: Invalid input(s): "POCBNP" CBG: Recent Labs  Lab 12/03/21 1614 12/03/21 2057 12/04/21 0043 12/04/21 0451 12/04/21 0732  GLUCAP 157* 121* 120* 145* 142*   D-Dimer No results for input(s): "DDIMER" in the last 72 hours. Hgb A1c No results for input(s): "HGBA1C" in the last 72 hours. Lipid Profile No results for input(s): "CHOL", "HDL", "LDLCALC", "TRIG", "CHOLHDL", "LDLDIRECT" in the last 72 hours. Thyroid function studies No results for input(s): "TSH", "T4TOTAL", "T3FREE", "THYROIDAB" in the last 72 hours.  Invalid input(s): "FREET3" Anemia work up No results for input(s): "VITAMINB12", "FOLATE", "FERRITIN", "TIBC", "IRON", "RETICCTPCT" in the last 72 hours. Urinalysis No results found for: "COLORURINE", "APPEARANCEUR", "LABSPEC", "PHURINE", "GLUCOSEU", "HGBUR", "BILIRUBINUR", "KETONESUR", "PROTEINUR", "UROBILINOGEN", "NITRITE", "LEUKOCYTESUR" Sepsis Labs Recent Labs  Lab 11/30/21 0335 12/01/21 0500 12/02/21 0434 12/04/21 0430  WBC 9.3 7.2 8.5 11.4*   Microbiology Recent Results (from the past 240 hour(s))  Resp Panel by RT-PCR (Flu A&B, Covid) Anterior Nasal Swab     Status: None   Collection Time: 11/28/21  1:00 PM   Specimen: Anterior Nasal Swab  Result Value Ref Range Status   SARS Coronavirus 2 by RT PCR NEGATIVE NEGATIVE Final    Comment: (NOTE) SARS-CoV-2 target nucleic acids  are NOT DETECTED.  The SARS-CoV-2 RNA is generally detectable in upper respiratory specimens during the acute phase of infection. The lowest concentration of SARS-CoV-2 viral copies this assay can detect is 138 copies/mL. A negative result does not preclude SARS-Cov-2 infection and should not be used as the sole basis for treatment or other patient management decisions.  A negative result may occur with  improper specimen collection/handling, submission of specimen other than nasopharyngeal swab, presence of viral mutation(s) within the areas targeted by this assay, and inadequate number of viral copies(<138 copies/mL). A negative result must be combined with clinical observations, patient history, and epidemiological information. The expected result is Negative.  Fact Sheet for Patients:  EntrepreneurPulse.com.au  Fact Sheet for Healthcare Providers:  IncredibleEmployment.be  This test is no t yet approved or cleared by the [place] FDA and  has been authorized for detection and/or diagnosis of SARS-CoV-2 by FDA under an Emergency Use Authorization (EUA). This EUA will remain  in effect (meaning this test can be used) for the duration of the COVID-19 declaration under Section 564(b)(1) of the Act, 21 U.S.C.section 360bbb-3(b)(1), unless the authorization is terminated  or revoked sooner.       Influenza A by PCR NEGATIVE NEGATIVE Final   Influenza B by PCR NEGATIVE NEGATIVE Final    Comment: (NOTE) The Xpert Xpress SARS-CoV-2/FLU/RSV plus assay is intended as an aid in the diagnosis of influenza from Nasopharyngeal swab specimens and should not be used as a sole basis for treatment. Nasal washings and aspirates are unacceptable for Xpert Xpress SARS-CoV-2/FLU/RSV testing.  Fact Sheet for Patients: EntrepreneurPulse.com.au  Fact Sheet for Healthcare Providers: IncredibleEmployment.be  This test is  not yet approved or cleared by the [place] FDA and has been authorized for detection and/or diagnosis of SARS-CoV-2 by FDA under an Emergency Use Authorization (EUA). This EUA will remain in effect (meaning this test can be used) for the duration of the COVID-19 declaration under Section 564(b)(1) of the Act, 21 U.S.C. section 360bbb-3(b)(1), unless the authorization is terminated or revoked.  Performed at [facility], [address], [place], [place] [address]   MRSA Next Gen by PCR, Nasal     Status: None   Collection Time: 11/28/21  5:58 PM   Specimen: Nasal Mucosa; Nasal Swab  Result Value Ref Range Status   MRSA by PCR Next Gen NOT DETECTED NOT DETECTED Final    Comment: (NOTE) The GeneXpert MRSA Assay (FDA approved for NASAL specimens only), is one component of a comprehensive MRSA colonization surveillance program. It is not intended to diagnose MRSA infection nor to guide or monitor treatment for MRSA infections. Test performance is not FDA approved in patients less than 56 years old. Performed at [facility], [address], [place], [place] [address]      Time coordinating discharge: 35 minutes  SIGNED:   Rodena Goldmann, DO [facility] 12/04/2021, 9:17 AM  If 7PM-7AM, please contact night-coverage [email or web address]

## 2021-12-04 NOTE — Care Management Important Message (Signed)
Important Message  Patient Details  Name: Michaela Moon MRN: [id number] Date of Birth: 06/18/1935   Medicare Important Message Given:  Yes (spoke with son Annabelle Harman at [phone] to review letter, no additonal copy needed)     Tommy Medal 12/04/2021, 10:49 AM

## 2021-12-04 NOTE — TOC Transition Note (Signed)
Transition of Care [facility]) - CM/SW Discharge Note   Patient Details  Name: Michaela Moon MRN: [id number] Date of Birth: [birth year]  Transition of Care [facility] CM/SW Contact:  Salome Arnt, LCSW Phone Number: 12/04/2021, 9:20 AM   Clinical Narrative:  Pt d/c today. Sarah with [facility] notified and orders are in. [name] aware and agreeable and will provide transport. RN updated.      Final next level of care: [place] Barriers to Discharge: Continued Medical Work up   Patient Goals and CMS Choice Patient states their goals for this hospitalization and ongoing recovery are:: return home   Choice offered to / list presented to : Patient  Discharge Placement                    Patient and family notified of of transfer: 12/04/21  Discharge Plan and Services                DME Arranged: Gilford Rile rolling DME Agency: [employer] Date DME Agency Contacted: 12/03/21 Time DME Agency Contacted: 1600 Representative spoke with at DME Agency: [place]: RN, PT   Date [place]: 12/04/21 Time [place]: 0920 Representative spoke with at [place]: [place] ([place]) Interventions     Readmission Risk Interventions     No data to display

## 2021-12-05 DIAGNOSIS — U071 COVID-19: Secondary | ICD-10-CM | POA: Diagnosis not present

## 2021-12-05 DIAGNOSIS — I10 Essential (primary) hypertension: Secondary | ICD-10-CM | POA: Diagnosis not present

## 2021-12-05 DIAGNOSIS — J440 Chronic obstructive pulmonary disease with acute lower respiratory infection: Secondary | ICD-10-CM | POA: Diagnosis not present

## 2021-12-05 DIAGNOSIS — J9621 Acute and chronic respiratory failure with hypoxia: Secondary | ICD-10-CM | POA: Diagnosis not present

## 2021-12-05 DIAGNOSIS — E1142 Type 2 diabetes mellitus with diabetic polyneuropathy: Secondary | ICD-10-CM | POA: Diagnosis not present

## 2021-12-06 DIAGNOSIS — U071 COVID-19: Secondary | ICD-10-CM | POA: Diagnosis not present

## 2021-12-06 DIAGNOSIS — J449 Chronic obstructive pulmonary disease, unspecified: Secondary | ICD-10-CM | POA: Diagnosis not present

## 2021-12-06 DIAGNOSIS — J9621 Acute and chronic respiratory failure with hypoxia: Secondary | ICD-10-CM | POA: Diagnosis not present

## 2021-12-06 DIAGNOSIS — I10 Essential (primary) hypertension: Secondary | ICD-10-CM | POA: Diagnosis not present

## 2021-12-06 DIAGNOSIS — E1142 Type 2 diabetes mellitus with diabetic polyneuropathy: Secondary | ICD-10-CM | POA: Diagnosis not present

## 2021-12-06 DIAGNOSIS — J440 Chronic obstructive pulmonary disease with acute lower respiratory infection: Secondary | ICD-10-CM | POA: Diagnosis not present

## 2021-12-07 ENCOUNTER — Emergency Department (HOSPITAL_COMMUNITY): Payer: Medicare Other

## 2021-12-07 ENCOUNTER — Inpatient Hospital Stay (HOSPITAL_COMMUNITY)
Admission: EM | Admit: 2021-12-07 | Discharge: 2021-12-20 | DRG: 190 | Disposition: A | Discharge status: Home / Self Care | Payer: Medicare Other | Attending: Family Medicine | Admitting: Family Medicine

## 2021-12-07 ENCOUNTER — Encounter (HOSPITAL_COMMUNITY): Payer: Self-pay

## 2021-12-07 ENCOUNTER — Other Ambulatory Visit: Payer: Self-pay

## 2021-12-07 DIAGNOSIS — I82493 Acute embolism and thrombosis of other specified deep vein of lower extremity, bilateral: Secondary | ICD-10-CM | POA: Diagnosis present

## 2021-12-07 DIAGNOSIS — E46 Unspecified protein-calorie malnutrition: Secondary | ICD-10-CM | POA: Diagnosis not present

## 2021-12-07 DIAGNOSIS — G629 Polyneuropathy, unspecified: Secondary | ICD-10-CM

## 2021-12-07 DIAGNOSIS — E1165 Type 2 diabetes mellitus with hyperglycemia: Secondary | ICD-10-CM | POA: Diagnosis not present

## 2021-12-07 DIAGNOSIS — Z91018 Allergy to other foods: Secondary | ICD-10-CM

## 2021-12-07 DIAGNOSIS — E11649 Type 2 diabetes mellitus with hypoglycemia without coma: Secondary | ICD-10-CM | POA: Diagnosis not present

## 2021-12-07 DIAGNOSIS — Z7984 Long term (current) use of oral hypoglycemic drugs: Secondary | ICD-10-CM

## 2021-12-07 DIAGNOSIS — J440 Chronic obstructive pulmonary disease with acute lower respiratory infection: Secondary | ICD-10-CM | POA: Diagnosis present

## 2021-12-07 DIAGNOSIS — J449 Chronic obstructive pulmonary disease, unspecified: Secondary | ICD-10-CM | POA: Diagnosis not present

## 2021-12-07 DIAGNOSIS — Z794 Long term (current) use of insulin: Secondary | ICD-10-CM | POA: Diagnosis not present

## 2021-12-07 DIAGNOSIS — J9621 Acute and chronic respiratory failure with hypoxia: Secondary | ICD-10-CM | POA: Diagnosis not present

## 2021-12-07 DIAGNOSIS — E872 Acidosis, unspecified: Secondary | ICD-10-CM | POA: Diagnosis present

## 2021-12-07 DIAGNOSIS — D509 Iron deficiency anemia, unspecified: Secondary | ICD-10-CM | POA: Diagnosis not present

## 2021-12-07 DIAGNOSIS — I110 Hypertensive heart disease with heart failure: Secondary | ICD-10-CM | POA: Diagnosis not present

## 2021-12-07 DIAGNOSIS — J9690 Respiratory failure, unspecified, unspecified whether with hypoxia or hypercapnia: Secondary | ICD-10-CM | POA: Diagnosis not present

## 2021-12-07 DIAGNOSIS — Z87891 Personal history of nicotine dependence: Secondary | ICD-10-CM

## 2021-12-07 DIAGNOSIS — Z8616 Personal history of COVID-19: Secondary | ICD-10-CM | POA: Diagnosis not present

## 2021-12-07 DIAGNOSIS — J9601 Acute respiratory failure with hypoxia: Principal | ICD-10-CM

## 2021-12-07 DIAGNOSIS — R000 Tachycardia, unspecified: Secondary | ICD-10-CM | POA: Diagnosis not present

## 2021-12-07 DIAGNOSIS — T383X5A Adverse effect of insulin and oral hypoglycemic [antidiabetic] drugs, initial encounter: Secondary | ICD-10-CM | POA: Diagnosis not present

## 2021-12-07 DIAGNOSIS — E782 Mixed hyperlipidemia: Secondary | ICD-10-CM | POA: Diagnosis not present

## 2021-12-07 DIAGNOSIS — J441 Chronic obstructive pulmonary disease with (acute) exacerbation: Principal | ICD-10-CM | POA: Diagnosis present

## 2021-12-07 DIAGNOSIS — I10 Essential (primary) hypertension: Secondary | ICD-10-CM | POA: Diagnosis present

## 2021-12-07 DIAGNOSIS — E1140 Type 2 diabetes mellitus with diabetic neuropathy, unspecified: Secondary | ICD-10-CM | POA: Diagnosis present

## 2021-12-07 DIAGNOSIS — I824Z3 Acute embolism and thrombosis of unspecified deep veins of distal lower extremity, bilateral: Secondary | ICD-10-CM | POA: Diagnosis not present

## 2021-12-07 DIAGNOSIS — Z66 Do not resuscitate: Secondary | ICD-10-CM | POA: Diagnosis not present

## 2021-12-07 DIAGNOSIS — M1990 Unspecified osteoarthritis, unspecified site: Secondary | ICD-10-CM | POA: Diagnosis present

## 2021-12-07 DIAGNOSIS — Z7951 Long term (current) use of inhaled steroids: Secondary | ICD-10-CM

## 2021-12-07 DIAGNOSIS — Z79899 Other long term (current) drug therapy: Secondary | ICD-10-CM | POA: Diagnosis not present

## 2021-12-07 DIAGNOSIS — Z515 Encounter for palliative care: Secondary | ICD-10-CM | POA: Diagnosis not present

## 2021-12-07 DIAGNOSIS — E8809 Other disorders of plasma-protein metabolism, not elsewhere classified: Secondary | ICD-10-CM | POA: Diagnosis present

## 2021-12-07 DIAGNOSIS — E669 Obesity, unspecified: Secondary | ICD-10-CM | POA: Diagnosis present

## 2021-12-07 DIAGNOSIS — Z961 Presence of intraocular lens: Secondary | ICD-10-CM | POA: Diagnosis present

## 2021-12-07 DIAGNOSIS — R1319 Other dysphagia: Secondary | ICD-10-CM | POA: Diagnosis not present

## 2021-12-07 DIAGNOSIS — K219 Gastro-esophageal reflux disease without esophagitis: Secondary | ICD-10-CM | POA: Diagnosis not present

## 2021-12-07 DIAGNOSIS — Z6829 Body mass index (BMI) 29.0-29.9, adult: Secondary | ICD-10-CM

## 2021-12-07 DIAGNOSIS — E119 Type 2 diabetes mellitus without complications: Secondary | ICD-10-CM | POA: Diagnosis not present

## 2021-12-07 DIAGNOSIS — I5031 Acute diastolic (congestive) heart failure: Secondary | ICD-10-CM

## 2021-12-07 DIAGNOSIS — R911 Solitary pulmonary nodule: Secondary | ICD-10-CM | POA: Diagnosis not present

## 2021-12-07 DIAGNOSIS — R7989 Other specified abnormal findings of blood chemistry: Secondary | ICD-10-CM | POA: Diagnosis not present

## 2021-12-07 DIAGNOSIS — Z1152 Encounter for screening for COVID-19: Secondary | ICD-10-CM | POA: Diagnosis not present

## 2021-12-07 DIAGNOSIS — J189 Pneumonia, unspecified organism: Secondary | ICD-10-CM | POA: Diagnosis present

## 2021-12-07 DIAGNOSIS — Z7189 Other specified counseling: Secondary | ICD-10-CM | POA: Diagnosis not present

## 2021-12-07 DIAGNOSIS — R1310 Dysphagia, unspecified: Secondary | ICD-10-CM | POA: Diagnosis not present

## 2021-12-07 DIAGNOSIS — R0602 Shortness of breath: Secondary | ICD-10-CM | POA: Diagnosis not present

## 2021-12-07 DIAGNOSIS — I509 Heart failure, unspecified: Secondary | ICD-10-CM | POA: Diagnosis not present

## 2021-12-07 DIAGNOSIS — J90 Pleural effusion, not elsewhere classified: Secondary | ICD-10-CM | POA: Diagnosis not present

## 2021-12-07 DIAGNOSIS — R0902 Hypoxemia: Secondary | ICD-10-CM | POA: Diagnosis not present

## 2021-12-07 DIAGNOSIS — Z7982 Long term (current) use of aspirin: Secondary | ICD-10-CM

## 2021-12-07 DIAGNOSIS — R1314 Dysphagia, pharyngoesophageal phase: Secondary | ICD-10-CM | POA: Diagnosis not present

## 2021-12-07 DIAGNOSIS — R918 Other nonspecific abnormal finding of lung field: Secondary | ICD-10-CM | POA: Diagnosis not present

## 2021-12-07 DIAGNOSIS — Z9981 Dependence on supplemental oxygen: Secondary | ICD-10-CM

## 2021-12-07 DIAGNOSIS — T380X5A Adverse effect of glucocorticoids and synthetic analogues, initial encounter: Secondary | ICD-10-CM | POA: Diagnosis not present

## 2021-12-07 LAB — COMPREHENSIVE METABOLIC PANEL
ALT: 34 U/L (ref 0–44)
AST: 32 U/L (ref 15–41)
Albumin: 3.3 g/dL — ABNORMAL LOW (ref 3.5–5.0)
Alkaline Phosphatase: 52 U/L (ref 38–126)
Anion gap: 11 (ref 5–15)
BUN: 11 mg/dL (ref 8–23)
CO2: 21 mmol/L — ABNORMAL LOW (ref 22–32)
Calcium: 10.5 mg/dL — ABNORMAL HIGH (ref 8.9–10.3)
Chloride: 107 mmol/L (ref 98–111)
Creatinine, Ser: 0.77 mg/dL (ref 0.44–1.00)
GFR, Estimated: >60 mL/min (ref >60)
Glucose, Bld: 88 mg/dL (ref 70–99)
Potassium: 3.7 mmol/L (ref 3.5–5.1)
Sodium: 139 mmol/L (ref 135–145)
Total Bilirubin: 0.4 mg/dL (ref 0.3–1.2)
Total Protein: 7.2 g/dL (ref 6.5–8.1)

## 2021-12-07 LAB — BLOOD GAS, ARTERIAL
Acid-Base Excess: 1 mmol/L (ref 0.0–2.0)
Bicarbonate: 25.1 mmol/L (ref 20.0–28.0)
Drawn by: 22179
O2 Saturation: 95.6 %
Patient temperature: 37.1
pCO2 arterial: 37 mmHg (ref 32–48)
pH, Arterial: 7.44 (ref 7.35–7.45)
pO2, Arterial: 74 mmHg — ABNORMAL LOW (ref 83–108)

## 2021-12-07 LAB — PROTIME-INR
INR: 1.1 (ref 0.8–1.2)
Prothrombin Time: 13.6 seconds (ref 11.4–15.2)

## 2021-12-07 LAB — LACTIC ACID, PLASMA
Lactic Acid, Venous: 2.8 mmol/L (ref 0.5–1.9)
Lactic Acid, Venous: 2.3 mmol/L (ref 0.5–1.9)

## 2021-12-07 LAB — RESP PANEL BY RT-PCR (FLU A&B, COVID) ARPGX2
Influenza A by PCR: NEGATIVE
Influenza B by PCR: NEGATIVE
SARS Coronavirus 2 by RT PCR: NEGATIVE

## 2021-12-07 LAB — CBC WITH DIFFERENTIAL/PLATELET
Abs Immature Granulocytes: 0.37 10*3/uL — ABNORMAL HIGH (ref 0.00–0.07)
Basophils Absolute: 0.1 10*3/uL (ref 0.0–0.1)
Basophils Relative: 1 %
Eosinophils Absolute: 0.2 10*3/uL (ref 0.0–0.5)
Eosinophils Relative: 1 %
HCT: 32.8 % — ABNORMAL LOW (ref 36.0–46.0)
Hemoglobin: 9.8 g/dL — ABNORMAL LOW (ref 12.0–15.0)
Immature Granulocytes: 2 %
Lymphocytes Relative: 20 %
Lymphs Abs: 3 10*3/uL (ref 0.7–4.0)
MCH: 24.6 pg — ABNORMAL LOW (ref 26.0–34.0)
MCHC: 29.9 g/dL — ABNORMAL LOW (ref 30.0–36.0)
MCV: 82.2 fL (ref 80.0–100.0)
Monocytes Absolute: 1.1 10*3/uL — ABNORMAL HIGH (ref 0.1–1.0)
Monocytes Relative: 7 %
Neutro Abs: 10.6 10*3/uL — ABNORMAL HIGH (ref 1.7–7.7)
Neutrophils Relative %: 69 %
Platelets: 208 10*3/uL (ref 150–400)
RBC: 3.99 MIL/uL (ref 3.87–5.11)
RDW: 26.8 % — ABNORMAL HIGH (ref 11.5–15.5)
WBC Morphology: ABNORMAL
WBC: 15.3 10*3/uL — ABNORMAL HIGH (ref 4.0–10.5)
nRBC: 0 % (ref 0.0–0.2)

## 2021-12-07 LAB — GLUCOSE, CAPILLARY: Glucose-Capillary: 74 mg/dL (ref 70–99)

## 2021-12-07 LAB — APTT: aPTT: 28 seconds (ref 24–36)

## 2021-12-07 LAB — BRAIN NATRIURETIC PEPTIDE: B Natriuretic Peptide: 116 pg/mL — ABNORMAL HIGH (ref 0.0–100.0)

## 2021-12-07 MED ORDER — MONTELUKAST SODIUM 10 MG PO TABS
10.0000 mg | ORAL_TABLET | Freq: Every day | ORAL | Status: DC
  Administered 2021-12-07 – 2021-12-19 (×13): 10 mg via ORAL
  Filled 2021-12-07 (×13): qty 1

## 2021-12-07 MED ORDER — ACETAMINOPHEN 325 MG PO TABS
650.0000 mg | ORAL_TABLET | Freq: Four times a day (QID) | ORAL | Status: DC | PRN
  Administered 2021-12-11 – 2021-12-15 (×2): 650 mg via ORAL
  Filled 2021-12-07 (×2): qty 2

## 2021-12-07 MED ORDER — DOCUSATE SODIUM 100 MG PO CAPS
100.0000 mg | ORAL_CAPSULE | Freq: Every day | ORAL | Status: DC
  Administered 2021-12-07 – 2021-12-20 (×14): 100 mg via ORAL
  Filled 2021-12-07 (×14): qty 1

## 2021-12-07 MED ORDER — SODIUM CHLORIDE 0.9 % IV SOLN
2.0000 g | INTRAVENOUS | Status: AC
  Administered 2021-12-07 – 2021-12-11 (×5): 2 g via INTRAVENOUS
  Filled 2021-12-07 (×5): qty 20

## 2021-12-07 MED ORDER — GLUCERNA SHAKE PO LIQD
237.0000 mL | Freq: Three times a day (TID) | ORAL | Status: DC
  Administered 2021-12-08 – 2021-12-19 (×33): 237 mL via ORAL

## 2021-12-07 MED ORDER — PANTOPRAZOLE SODIUM 40 MG PO TBEC
40.0000 mg | DELAYED_RELEASE_TABLET | Freq: Every day | ORAL | Status: DC
  Administered 2021-12-07 – 2021-12-20 (×14): 40 mg via ORAL
  Filled 2021-12-07 (×15): qty 1

## 2021-12-07 MED ORDER — IPRATROPIUM-ALBUTEROL 0.5-2.5 (3) MG/3ML IN SOLN
3.0000 mL | Freq: Four times a day (QID) | RESPIRATORY_TRACT | Status: DC
  Administered 2021-12-07 – 2021-12-16 (×34): 3 mL via RESPIRATORY_TRACT
  Filled 2021-12-07 (×35): qty 3

## 2021-12-07 MED ORDER — ACETAMINOPHEN 650 MG RE SUPP: 650.0000 mg | Freq: Four times a day (QID) | RECTAL | Status: DC | PRN

## 2021-12-07 MED ORDER — LOSARTAN POTASSIUM 50 MG PO TABS
100.0000 mg | ORAL_TABLET | Freq: Every day | ORAL | Status: DC
  Administered 2021-12-08 – 2021-12-20 (×13): 100 mg via ORAL
  Filled 2021-12-07 (×13): qty 2

## 2021-12-07 MED ORDER — ASPIRIN 81 MG PO CHEW
81.0000 mg | CHEWABLE_TABLET | Freq: Every day | ORAL | Status: DC
  Administered 2021-12-07 – 2021-12-20 (×14): 81 mg via ORAL
  Filled 2021-12-07 (×14): qty 1

## 2021-12-07 MED ORDER — LACTATED RINGERS IV SOLN
INTRAVENOUS | Status: DC
  Administered 2021-12-07: 150 mL/h via INTRAVENOUS

## 2021-12-07 MED ORDER — METHYLPREDNISOLONE SODIUM SUCC 125 MG IJ SOLR
125.0000 mg | Freq: Once | INTRAMUSCULAR | Status: AC
  Administered 2021-12-07: 125 mg via INTRAVENOUS
  Filled 2021-12-07: qty 2

## 2021-12-07 MED ORDER — METOPROLOL TARTRATE 50 MG PO TABS
50.0000 mg | ORAL_TABLET | Freq: Two times a day (BID) | ORAL | Status: DC
  Administered 2021-12-07 – 2021-12-20 (×26): 50 mg via ORAL
  Filled 2021-12-07 (×26): qty 1

## 2021-12-07 MED ORDER — GABAPENTIN 400 MG PO CAPS
400.0000 mg | ORAL_CAPSULE | Freq: Three times a day (TID) | ORAL | Status: DC
  Administered 2021-12-07: 400 mg via ORAL
  Filled 2021-12-07: qty 1

## 2021-12-07 MED ORDER — ONDANSETRON HCL 4 MG PO TABS: 4.0000 mg | ORAL_TABLET | Freq: Four times a day (QID) | ORAL | Status: DC | PRN

## 2021-12-07 MED ORDER — ATORVASTATIN CALCIUM 40 MG PO TABS
80.0000 mg | ORAL_TABLET | Freq: Every day | ORAL | Status: DC
  Administered 2021-12-08 – 2021-12-20 (×13): 80 mg via ORAL
  Filled 2021-12-07 (×13): qty 2

## 2021-12-07 MED ORDER — SODIUM CHLORIDE 0.9 % IV SOLN
500.0000 mg | INTRAVENOUS | Status: AC
  Administered 2021-12-07 – 2021-12-11 (×5): 500 mg via INTRAVENOUS
  Filled 2021-12-07 (×5): qty 5

## 2021-12-07 MED ORDER — ALBUTEROL SULFATE (2.5 MG/3ML) 0.083% IN NEBU
10.0000 mg | INHALATION_SOLUTION | RESPIRATORY_TRACT | Status: AC
  Administered 2021-12-07: 10 mg via RESPIRATORY_TRACT
  Filled 2021-12-07 (×2): qty 12

## 2021-12-07 MED ORDER — GUAIFENESIN ER 600 MG PO TB12: 1200.0000 mg | ORAL_TABLET | Freq: Two times a day (BID) | ORAL | Status: DC | PRN

## 2021-12-07 MED ORDER — METHYLPREDNISOLONE SODIUM SUCC 40 MG IJ SOLR
40.0000 mg | Freq: Two times a day (BID) | INTRAMUSCULAR | Status: DC
  Administered 2021-12-08 – 2021-12-10 (×5): 40 mg via INTRAVENOUS
  Filled 2021-12-07 (×5): qty 1

## 2021-12-07 MED ORDER — BUDESON-GLYCOPYRROL-FORMOTEROL 160-9-4.8 MCG/ACT IN AERO: 2.0000 | INHALATION_SPRAY | Freq: Two times a day (BID) | RESPIRATORY_TRACT | Status: DC

## 2021-12-07 MED ORDER — FERROUS SULFATE 325 (65 FE) MG PO TABS
325.0000 mg | ORAL_TABLET | Freq: Every day | ORAL | Status: DC
  Administered 2021-12-07 – 2021-12-20 (×14): 325 mg via ORAL
  Filled 2021-12-07 (×14): qty 1

## 2021-12-07 MED ORDER — IPRATROPIUM-ALBUTEROL 0.5-2.5 (3) MG/3ML IN SOLN
3.0000 mL | RESPIRATORY_TRACT | Status: DC | PRN
  Administered 2021-12-17: 3 mL via RESPIRATORY_TRACT
  Filled 2021-12-07: qty 3

## 2021-12-07 MED ORDER — ONDANSETRON HCL 4 MG/2ML IJ SOLN: 4.0000 mg | Freq: Four times a day (QID) | INTRAMUSCULAR | Status: DC | PRN

## 2021-12-07 MED ORDER — ENOXAPARIN SODIUM 40 MG/0.4ML IJ SOSY
40.0000 mg | PREFILLED_SYRINGE | INTRAMUSCULAR | Status: DC
  Administered 2021-12-07: 40 mg via SUBCUTANEOUS
  Filled 2021-12-07: qty 0.4

## 2021-12-07 NOTE — ED Triage Notes (Signed)
Pt has low oxygen level. Normally on 2L 91%. Pt currently 4L 83%

## 2021-12-07 NOTE — Progress Notes (Signed)
Pt received from ED at [place], pt on 15L HFNC with hour long neb treatment being given, pt slightly confused but pleasant and oriented to unit, no family present at this time. Pt currently has no complaints. Slightly hypertensive otherwise VSS.

## 2021-12-07 NOTE — ED Notes (Signed)
This RN stopped the pts neb tx d/t desats, pt placed back on high flow oxygen, sats improved, RT to report to the bedside, MD notified

## 2021-12-07 NOTE — Sepsis Progress Note (Signed)
Code Sepsis protocol being monitored by eLink. 

## 2021-12-07 NOTE — ED Notes (Signed)
RT at bedside.

## 2021-12-07 NOTE — Progress Notes (Signed)
Pt currently on HFNC 30L/80% O2 sat 90% pt able to pass swallow screen without difficulty.

## 2021-12-07 NOTE — ED Notes (Signed)
RT notified d/t O2 sats 87% while receiving neb tx, RT to come to bedside

## 2021-12-07 NOTE — Progress Notes (Signed)
Approximately 1805 RN returned patient to 15 L/m  HFNC, due to decrease in SpO2. Patient currently with SpO2 of 91%.

## 2021-12-07 NOTE — Progress Notes (Signed)
Placed patient on [place].  Patient had to go up to 35L at 100% to keep sats above 90%.  Patient has been varying between 91 to 93% on these settings.  Will continue to monitor.

## 2021-12-07 NOTE — ED Provider Notes (Signed)
[facility] EMERGENCY DEPARTMENT Provider Note   CSN: [id number] Arrival date & time: 12/07/21  1416     History  Chief Complaint  Patient presents with   Shortness of Breath    Michaela Moon is a 86 y.o. female.   Shortness of Breath    This patient is an 86 year old female, she has a known history of COPD, she has high cholesterol, she is a diabetic and also has hypertension.,  The patient was recently admitted to an outside hospital in September because of having COVID-19, she was treated by the pulmonologist for recurrent pneumonia on 2 October and ultimately was admitted to the hospital on October 4 with multifocal pneumonia here.  She has been on 2 L of oxygen ever since having COVID and they have been watching her oxygen at home including a home health nurse who came out yesterday and noted her to be slightly hypoxic.  As they have watched her oxygen over the last 24 hours they have noticed that it is precipitously dropped down into the low 80%'s and they have had to turn her up to 5 L.  On arrival her oxygen level was 82% on 5 L by nasal cannula.  The patient is not very conversant and has a very low health literacy as does her son who is the primary caregiver and who lives with her.  Evidently they are unaware if she has any heart failure  Review of the medical record shows no prior echocardiograms  Home Medications Prior to Admission medications   Medication Sig Start Date End Date Taking? Authorizing Provider  albuterol (PROVENTIL) (2.5 MG/3ML) 0.083% nebulizer solution Take 3 mLs (2.5 mg total) by nebulization every 6 (six) hours as needed for wheezing or shortness of breath. 02/14/21  Yes Chesley Mires, MD  albuterol-ipratropium (COMBIVENT) 18-103 MCG/ACT inhaler Inhale 2 puffs into the lungs every 6 (six) hours as needed for wheezing. 11/27/12  Yes Vernie Shanks, MD  BIOTIN PO Take by mouth.   Yes [provider]  Budeson-Glycopyrrol-Formoterol (BREZTRI  AEROSPHERE) 160-9-4.8 MCG/ACT AERO Inhale 2 puffs into the lungs in the morning and at bedtime. 03/08/21  Yes Chesley Mires, MD  docusate sodium (COLACE) 100 MG capsule Take 1 capsule (100 mg total) by mouth daily. 12/04/21 12/04/22 Yes Shah, Pratik D, DO  ferrous sulfate 325 (65 FE) MG tablet Take 1 tablet (325 mg total) by mouth daily. 12/04/21 12/04/22 Yes Shah, Pratik D, DO  fluticasone (FLONASE) 50 MCG/ACT nasal spray Place 1 spray into both nostrils daily. 10/19/20  Yes Chesley Mires, MD  gabapentin (NEURONTIN) 400 MG capsule Take 400 mg by mouth 3 (three) times daily.   Yes [provider]  glimepiride (AMARYL) 2 MG tablet Take 4 mg by mouth daily with breakfast.    Yes [provider]  guaiFENesin (MUCINEX) 600 MG 12 hr tablet Take 2 tablets (1,200 mg total) by mouth 2 (two) times daily as needed for cough or to loosen phlegm. 02/14/21  Yes Chesley Mires, MD  Hypromellose (ARTIFICIAL TEARS OP) Place 1 drop into both eyes 4 (four) times daily.   Yes [provider]  ipratropium (ATROVENT) 0.03 % nasal spray Place 2 sprays into both nostrils every 12 (twelve) hours. 02/14/21  Yes Chesley Mires, MD  Ipratropium-Albuterol (COMBIVENT) 20-100 MCG/ACT AERS respimat Inhale 1 puff into the lungs every 6 (six) hours. 10/01/21  Yes Chesley Mires, MD  LANTUS SOLOSTAR 100 UNIT/ML Solostar Pen Inject 20 Units into the skin in the morning  and at bedtime. 11/22/20  Yes [provider]  losartan-hydrochlorothiazide (HYZAAR) 100-12.5 MG per tablet TAKE ONE (1) TABLET EACH DAY Patient taking differently: Take 1 tablet by mouth daily. 03/09/14  Yes Chipper Herb, MD  metoprolol (LOPRESSOR) 50 MG tablet Take 50 mg by mouth 2 (two) times daily.   Yes [provider]  montelukast (SINGULAIR) 10 MG tablet TAKE ONE TABLET BY MOUTH AT BEDTIME 07/30/21  Yes Chesley Mires, MD  Multiple Vitamins-Minerals (CENTRUM SILVER PO) Take 1 tablet by mouth daily.   Yes [provider]   Omega-3 Fatty Acids (FISH OIL) 1000 MG CAPS Take 1,000 mg by mouth in the morning, at noon, and at bedtime.   Yes [provider]  oxybutynin (DITROPAN) 5 MG tablet Take 5 mg by mouth every 8 (eight) hours as needed for bladder spasms.    Yes [provider]  pantoprazole (PROTONIX) 40 MG tablet Take 40 mg by mouth daily. 10/23/21  Yes [provider]  sitaGLIPtin-metformin (JANUMET) 50-1000 MG tablet Take 1 tablet by mouth 2 (two) times daily with a meal.   Yes [provider]  triamcinolone cream (KENALOG) 0.1 % Apply 1 application topically daily as needed (eczema).   Yes [provider]  aspirin 81 MG tablet Take 81 mg by mouth daily.    [provider]  atorvastatin (LIPITOR) 80 MG tablet ALTERNATE TAKING 1 TABLET EVERY OTHER DAY WITH 1/2 TABLET EVERY OTHERDAY 01/13/14   Chipper Herb, MD  Azelastine HCl (ASTEPRO) 0.15 % SOLN Place 1 spray into the nose at bedtime. 04/24/21   Chesley Mires, MD  Biotin w/ Vitamins C & E (HAIR/SKIN/NAILS PO) Take 1 tablet by mouth daily. Takes once a day.    [provider]      Allergies    Onion    Review of Systems   Review of Systems  Respiratory:  Positive for shortness of breath.   All other systems reviewed and are negative.   Physical Exam Updated Vital Signs BP (!) 128/53   Pulse 88   Temp 97.8 F (36.6 C) (Oral)   Resp 19   Ht 1.651 m ('5\' 5"'$ )   Wt 80.7 kg   SpO2 92%   BMI 29.62 kg/m  Physical Exam Vitals and nursing note reviewed.  Constitutional:      General: She is in acute distress.     Appearance: She is well-developed. She is ill-appearing.  HENT:     Head: Normocephalic and atraumatic.     Nose: No congestion or rhinorrhea.     Mouth/Throat:     Mouth: Mucous membranes are dry.     Pharynx: No oropharyngeal exudate.  Eyes:     General: No scleral icterus.       Right eye: No discharge.        Left eye: No discharge.     Conjunctiva/sclera: Conjunctivae  normal.     Pupils: Pupils are equal, round, and reactive to light.  Neck:     Thyroid: No thyromegaly.     Vascular: No JVD.  Cardiovascular:     Rate and Rhythm: Regular rhythm. Tachycardia present.     Heart sounds: Normal heart sounds. No murmur heard.    No friction rub. No gallop.  Pulmonary:     Effort: Pulmonary effort is normal. No respiratory distress.     Breath sounds: Wheezing, rhonchi and rales present.  Abdominal:     General: Bowel sounds are normal. There is no  distension.     Palpations: Abdomen is soft. There is no mass.     Tenderness: There is no abdominal tenderness.  Musculoskeletal:        General: No tenderness. Normal range of motion.     Cervical back: Normal range of motion and neck supple.     Right lower leg: Edema present.     Left lower leg: Edema present.  Lymphadenopathy:     Cervical: No cervical adenopathy.  Skin:    General: Skin is warm and dry.     Findings: No erythema or rash.  Neurological:     Mental Status: She is alert.     Coordination: Coordination normal.  Psychiatric:        Behavior: Behavior normal.     ED Results / Procedures / Treatments   Labs (all labs ordered are listed, but only abnormal results are displayed) Labs Reviewed  LACTIC ACID, PLASMA - Abnormal; Notable for the following components:      Result Value   Lactic Acid, Venous 2.8 (*)    All other components within normal limits  COMPREHENSIVE METABOLIC PANEL - Abnormal; Notable for the following components:   CO2 21 (*)    Calcium 10.5 (*)    Albumin 3.3 (*)    All other components within normal limits  CBC WITH DIFFERENTIAL/PLATELET - Abnormal; Notable for the following components:   WBC 15.3 (*)    Hemoglobin 9.8 (*)    HCT 32.8 (*)    MCH 24.6 (*)    MCHC 29.9 (*)    RDW 26.8 (*)    Neutro Abs 10.6 (*)    Monocytes Absolute 1.1 (*)    Abs Immature Granulocytes 0.37 (*)    All other components within normal limits  BLOOD GAS, ARTERIAL -  Abnormal; Notable for the following components:   pO2, Arterial 74 (*)    All other components within normal limits  CULTURE, BLOOD (ROUTINE X 2)  CULTURE, BLOOD (ROUTINE X 2)  RESP PANEL BY RT-PCR (FLU A&B, COVID) ARPGX2  URINE CULTURE  PROTIME-INR  APTT  LACTIC ACID, PLASMA  URINALYSIS, ROUTINE W REFLEX MICROSCOPIC  BRAIN NATRIURETIC PEPTIDE    EKG EKG Interpretation  Date/Time:  Friday December 07 2021 14:37:14 EDT Ventricular Rate:  117 PR Interval:  169 QRS Duration: 82 QT Interval:  297 QTC Calculation: 415 R Axis:   23 Text Interpretation: Sinus tachycardia Probable left atrial enlargement Borderline repolarization abnormality Baseline wander in lead(s) V1 Since last tracing rate faster Confirmed by Noemi Chapel [phone]) on 12/07/2021 2:48:02 PM  Radiology DG Chest Port 1 View  Result Date: 12/07/2021 CLINICAL DATA:  Questionable sepsis. Evaluate for abnormality. Hypoxemia. History of asthma/COPD. EXAM: PORTABLE CHEST 1 VIEW COMPARISON:  Radiographs 11/29/2021 and 11/28/2021.  CT 11/28/2021. FINDINGS: 1509 hours. The heart size and mediastinal contours appear stable. Compared with the most recent studies, there is partial clearing of the bibasilar airspace opacities, most consistent with improving pneumonia. No pneumothorax or significant pleural effusion identified. No acute osseous findings are seen. Telemetry leads overlie the chest. There is multilevel thoracic spondylosis. IMPRESSION: Interval partial clearing of bibasilar airspace opacities, most consistent with improving pneumonia. Continued follow-up recommended to document complete clearing. No new findings identified. Electronically Signed   By: Richardean Sale M.D.   On: 12/07/2021 15:27    Procedures .Critical Care  Performed by: Noemi Chapel, MD Authorized by: Noemi Chapel, MD   Critical care provider statement:    Critical care time (minutes):  30   Critical care time was exclusive of:  Separately billable  procedures and treating other patients and teaching time   Critical care was necessary to treat or prevent imminent or life-threatening deterioration of the following conditions:  Sepsis and respiratory failure   Critical care was time spent personally by me on the following activities:  Development of treatment plan with patient or surrogate, discussions with consultants, evaluation of patient's response to treatment, examination of patient, ordering and review of laboratory studies, ordering and review of radiographic studies, ordering and performing treatments and interventions, pulse oximetry, re-evaluation of patient's condition, review of old charts and obtaining history from patient or surrogate   I assumed direction of critical care for this patient from another provider in my specialty: no     Care discussed with: admitting provider   Comments:           Medications Ordered in ED Medications  lactated ringers infusion (150 mL/hr Intravenous New Bag/Given 12/07/21 1522)  cefTRIAXone (ROCEPHIN) 2 g in sodium chloride 0.9 % 100 mL IVPB (0 g Intravenous Stopped 12/07/21 1555)  azithromycin (ZITHROMAX) 500 mg in sodium chloride 0.9 % 250 mL IVPB (500 mg Intravenous New Bag/Given 12/07/21 1549)  albuterol (PROVENTIL,VENTOLIN) solution continuous neb (has no administration in time range)  methylPREDNISolone sodium succinate (SOLU-MEDROL) 125 mg/2 mL injection 125 mg (has no administration in time range)    ED Course/ Medical Decision Making/ A&P                           Medical Decision Making Amount and/or Complexity of Data Reviewed Labs: ordered. Radiology: ordered. ECG/medicine tests: ordered.  Risk Prescription drug management. Decision regarding hospitalization.   This patient presents to the ED for concern of possible worsening shortness of breath, this involves an extensive number of treatment options, and is a complaint that carries with it a high risk of complications  and morbidity.  The differential diagnosis includes pneumonia, pulmonary embolism, severe COPD, congestive heart failure, pneumothorax   Co morbidities that complicate the patient evaluation  COPD, diabetes   Additional history obtained:  Additional history obtained from electronic medical record External records from outside source obtained and reviewed including recent admissions and discharges from the hospital for pneumonia, the patient was discharged 3 days ago, at that time she was discharged on her baseline home oxygen and was giving a rolling walker, she follows with Dr. Halford Chessman of the pulmonology service.  She has known acute respiratory failure with hypoxemia.  She is supposed to be taking albuterol, ipratropium, baby aspirin, insulin, she finished her antibiotics while in the hospital.  She was treated with Rocephin and azithromycin during her prolonged stay.   Lab Tests:  I Ordered, and personally interpreted labs.  The pertinent results include:  increased WBC, lactic up.  Consistent with possible sepsis / respiratory failure.   Imaging Studies ordered:  I ordered imaging studies including chest xray  I independently visualized and interpreted imaging which showed improving pna - but still present. I agree with the radiologist interpretation   Cardiac Monitoring: / EKG:  The patient was maintained on a cardiac monitor.  I personally viewed and interpreted the cardiac monitored which showed an underlying rhythm of: sinus tachycadiua   Consultations Obtained:  I requested consultation with the hospitalist Dr. Josephine Cables,  and discussed lab and imaging findings as well as pertinent plan - they recommend: To the hospital   Problem List / ED  Course / Critical interventions / Medication management  Acute hypoxic respiratory failure, likely sepsis, worsening leukocytosis in the presence of being tachycardic and ill-appearing with pneumonia on the x-ray I ordered medication  including antibiotics and oxygen continuous nebulizer therapy and steroids for acute on chronic respiratory failure likely secondary to infection Reevaluation of the patient after these medicines showed that the patient critically ill I have reviewed the patients home medicines and have made adjustments as needed   Social Determinants of Health:  Ill, needs to be admitted to the hospital to high level of care   Test / Admission - Considered:  We will admit         Final Clinical Impression(s) / ED Diagnoses Final diagnoses:  Acute respiratory failure with hypoxia [facility])    Rx / DC Orders ED Discharge Orders     None         Noemi Chapel, MD 12/07/21 1700

## 2021-12-07 NOTE — H&P (Signed)
History and Physical    Patient: Michaela Moon QBV:[id number] DOB: [birth year] DOA: 12/07/2021 DOS: the patient was seen and examined on 12/07/2021 PCP: [name], MD  Patient coming from: Home  Chief Complaint:  Chief Complaint  Patient presents with   Shortness of Breath   HPI: Michaela Moon is a 86 y.o. female with medical history significant of hypertension, T2DM, COPD, dysphagia requiring dilation who presents to the emergency department due to shortness of breath. She was recently admitted from 10/4 to 10/10 due to acute on chronic hypoxemic respiratory failure secondary to multifocal pneumonia which was treated with IV Rocephin and azithromycin.  She completed a course of antibiotics while still in the hospital and O2 sat was weaned down to 4 to 5 L via [place].  It appeared that patient was on supplemental oxygen at 2 LPM on returning home after discharge.  Home oxygen was being monitored at home and when the home health nurse went to see patient yesterday, she was slightly hypoxic, home oxygen was monitored within the past 24 hours and was noted to have dropped down into the low 80s and the oxygen was increased to 5 L/min via [place], despite this, patient was still hypoxic with an O2 sat of 82% on arrival to the ED Patient was also recently admitted to [facility] from 9/2 to 9/12 due to multifocal pneumonia and acute respiratory failure with hypoxia due to COVID-19 during which she was transferred to ICU and she required BiPAP, she was treated with Paxlovid for 5 days and she continued Decadron and empiric antibiotic.  She was discharged with supplemental oxygen via [place] at 2 LPM  At bedside, patient was requiring supplemental oxygen via HFNC at 15 L, she was not in any acute distress.  There was concern regarding food getting stuck in her throat/chest.  ED Course:  In the emergency department, she was intermittently tachypneic, BP was 131/57, temperature 97.50F, pulse 100 bpm, O2 sat  95% on NRB.  Work-up in the ED showed WBC 15.3, hemoglobin 8.8, hematocrit 32.8, MCV 82.2, platelets 208.  BMP showed sodium 139, potassium 3.7, chloride 107, bicarb 24, glucose 88, BUN 11, creatinine 0.77.  Lactic acid 2.8 > 2.3, BNP 116 (this was 329 about 9 days ago).  Influenza A, B, SARS coronavirus 2 was negative.  Blood culture was pending. Chest x-ray showed Interval partial clearing of bibasilar airspace opacities, most consistent with improving pneumonia. Solu-Medrol 25 mg x 1 was given, she was started on IV ceftriaxone and azithromycin, IV hydration was provided.  Hospitalist was asked to admit patient for further evaluation and management.  Review of Systems: Review of systems as noted in the HPI. All other systems reviewed and are negative.   Past Medical History:  Diagnosis Date   Arthritis    Asthma    COPD (chronic obstructive pulmonary disease) ([place])    Diabetes mellitus without complication (HCC)    GERD (gastroesophageal reflux disease)    Headache    Hyperlipidemia    Hypertension    Macular degeneration    Neuropathy    Obesity    Osteopenia    Stress incontinence    Past Surgical History:  Procedure Laterality Date   BREAST LUMPECTOMY Bilateral    no cancer   CARPAL TUNNEL RELEASE Left 10/23/2017   Procedure: LEFT CARPAL TUNNEL RELEASE;  Surgeon: Carole Civil, MD;  Location: [facility];  Service: Orthopedics;  Laterality: Left;   CARPAL TUNNEL RELEASE Right 11/20/2017  Procedure: CARPAL TUNNEL RELEASE;  Surgeon: Carole Civil, MD;  Location: [facility];  Service: Orthopedics;  Laterality: Right;   CATARACT EXTRACTION W/PHACO Left 11/01/2016   Procedure: CATARACT EXTRACTION PHACO AND INTRAOCULAR LENS PLACEMENT (IOC);  Surgeon: Baruch Goldmann, MD;  Location: [facility];  Service: Ophthalmology;  Laterality: Left;  CDE: 3.83   CATARACT EXTRACTION W/PHACO Right 11/29/2016   Procedure: CATARACT EXTRACTION PHACO AND INTRAOCULAR LENS PLACEMENT RIGHT EYE;  Surgeon:  Baruch Goldmann, MD;  Location: [facility];  Service: Ophthalmology;  Laterality: Right;  CDE: 8.71   colon tumor removed  2010   Dr. 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 submucosal lipoma on path   COLONOSCOPY  05/2013   Dr. Anthony Sar: normal. (h/o colon polyps)   COLONOSCOPY  2011   normal   COLONOSCOPY  10/2003   Dr. Lindalou Hose: large polypoid mass hepatic fluexure   ESOPHAGOGASTRODUODENOSCOPY N/A 04/29/2016   Procedure: ESOPHAGOGASTRODUODENOSCOPY (EGD);  Surgeon: Danie Binder, MD;  Location: [facility];  Service: Endoscopy;  Laterality: N/A;  9:15am   SAVORY DILATION N/A 04/29/2016   Procedure: SAVORY DILATION;  Surgeon: Danie Binder, MD;  Location: [facility];  Service: Endoscopy;  Laterality: N/A;    Social History:  reports that she quit smoking about 19 years ago. Her smoking use included cigarettes. She has a 40.00 pack-year smoking history. She has never used smokeless tobacco. She reports that she does not drink alcohol and does not use drugs.   Allergies  Allergen Reactions   Onion     wheezing    Family History  Problem Relation Age of Onset   Melanoma Brother    Colon cancer Neg Hx      Prior to Admission medications   Medication Sig Start Date End Date Taking? Authorizing Provider  albuterol (PROVENTIL) (2.5 MG/3ML) 0.083% nebulizer solution Take 3 mLs (2.5 mg total) by nebulization every 6 (six) hours as needed for wheezing or shortness of breath. 02/14/21  Yes Chesley Mires, MD  albuterol-ipratropium (COMBIVENT) 18-103 MCG/ACT inhaler Inhale 2 puffs into the lungs every 6 (six) hours as needed for wheezing. 11/27/12  Yes Vernie Shanks, MD  BIOTIN PO Take by mouth.   Yes [provider]  Budeson-Glycopyrrol-Formoterol (BREZTRI AEROSPHERE) 160-9-4.8 MCG/ACT AERO Inhale 2 puffs into the lungs in the morning and at bedtime. 03/08/21  Yes Chesley Mires, MD  docusate sodium (COLACE) 100 MG capsule Take 1 capsule (100 mg  total) by mouth daily. 12/04/21 12/04/22 Yes Shah, Pratik D, DO  ferrous sulfate 325 (65 FE) MG tablet Take 1 tablet (325 mg total) by mouth daily. 12/04/21 12/04/22 Yes Shah, Pratik D, DO  fluticasone (FLONASE) 50 MCG/ACT nasal spray Place 1 spray into both nostrils daily. 10/19/20  Yes Chesley Mires, MD  gabapentin (NEURONTIN) 400 MG capsule Take 400 mg by mouth 3 (three) times daily.   Yes [provider]  glimepiride (AMARYL) 2 MG tablet Take 4 mg by mouth daily with breakfast.    Yes [provider]  guaiFENesin (MUCINEX) 600 MG 12 hr tablet Take 2 tablets (1,200 mg total) by mouth 2 (two) times daily as needed for cough or to loosen phlegm. 02/14/21  Yes Chesley Mires, MD  Hypromellose (ARTIFICIAL TEARS OP) Place 1 drop into both eyes 4 (four) times daily.   Yes [provider]  ipratropium (ATROVENT) 0.03 % nasal spray Place 2 sprays into both nostrils every 12 (twelve) hours. 02/14/21  Yes Chesley Mires, MD  Ipratropium-Albuterol (COMBIVENT) 20-100 MCG/ACT AERS respimat Inhale 1 puff into the lungs every 6 (six) hours. 10/01/21  Yes Chesley Mires, MD  LANTUS SOLOSTAR 100 UNIT/ML Solostar Pen Inject 20 Units into the skin in the morning and at bedtime. 11/22/20  Yes [provider]  losartan-hydrochlorothiazide (HYZAAR) 100-12.5 MG per tablet TAKE ONE (1) TABLET EACH DAY Patient taking differently: Take 1 tablet by mouth daily. 03/09/14  Yes Chipper Herb, MD  metoprolol (LOPRESSOR) 50 MG tablet Take 50 mg by mouth 2 (two) times daily.   Yes [provider]  montelukast (SINGULAIR) 10 MG tablet TAKE ONE TABLET BY MOUTH AT BEDTIME 07/30/21  Yes Chesley Mires, MD  Multiple Vitamins-Minerals (CENTRUM SILVER PO) Take 1 tablet by mouth daily.   Yes [provider]  Omega-3 Fatty Acids (FISH OIL) 1000 MG CAPS Take 1,000 mg by mouth in the morning, at noon, and at bedtime.   Yes [provider]  oxybutynin (DITROPAN) 5 MG tablet Take 5 mg by mouth  every 8 (eight) hours as needed for bladder spasms.    Yes [provider]  pantoprazole (PROTONIX) 40 MG tablet Take 40 mg by mouth daily. 10/23/21  Yes [provider]  sitaGLIPtin-metformin (JANUMET) 50-1000 MG tablet Take 1 tablet by mouth 2 (two) times daily with a meal.   Yes [provider]  triamcinolone cream (KENALOG) 0.1 % Apply 1 application topically daily as needed (eczema).   Yes [provider]  aspirin 81 MG tablet Take 81 mg by mouth daily.    [provider]  atorvastatin (LIPITOR) 80 MG tablet ALTERNATE TAKING 1 TABLET EVERY OTHER DAY WITH 1/2 TABLET EVERY OTHERDAY 01/13/14   Chipper Herb, MD  Azelastine HCl (ASTEPRO) 0.15 % SOLN Place 1 spray into the nose at bedtime. 04/24/21   Chesley Mires, MD  Biotin w/ Vitamins C & E (HAIR/SKIN/NAILS PO) Take 1 tablet by mouth daily. Takes once a day.    [provider]    Physical Exam: BP (!) 154/43   Pulse (!) 106   Temp 98.5 F (36.9 C) (Oral)   Resp 19   Ht '5\' 5"'$  (1.651 m)   Wt 81.1 kg   SpO2 90%   BMI 29.75 kg/m   General: 86 y.o. year-old female ill appearing, but in no acute distress.  Alert and oriented x3. HEENT: NCAT, EOMI, dry mucous membranes Neck: Supple, trachea medial Cardiovascular: Regular rate and rhythm with no rubs or gallops.  No thyromegaly or JVD noted.  B/L +2 lower extremity edema to mid shins. 2/4 pulses in all 4 extremities. Respiratory: Diffuse wheezing, rhonchi and coarse breath sounds bilaterally. Abdomen: Soft, nontender nondistended with normal bowel sounds x4 quadrants. Muskuloskeletal: No cyanosis or clubbing noted bilaterally Neuro: CN II-XII intact, sensation, reflexes intact Skin: No ulcerative lesions noted or rashes Psychiatry: Mood is appropriate for condition and setting          Labs on Admission:  Basic Metabolic Panel: Recent Labs  Lab 12/01/21 0500 12/02/21 0434 12/04/21 0430 12/07/21 1458  NA 141 140 139 139  K  3.4* 4.0 4.1 3.7  CL 105 108 106 107  CO2 '28 25 26 '$ 21*  GLUCOSE 136* 137* 139* 88  BUN '12 10 9 11  '$ CREATININE 0.65 0.60 0.65 0.77  CALCIUM 9.6 9.8 10.3 10.5*  MG 1.7 1.6* 1.6*  --    Liver Function Tests: Recent Labs  Lab 12/07/21 1458  AST 32  ALT 34  ALKPHOS 52  BILITOT 0.4  PROT 7.2  ALBUMIN 3.3*   No results for input(s): "LIPASE", "AMYLASE" in the last 168 hours. No results for input(s): "AMMONIA" in the last 168 hours. CBC: Recent Labs  Lab 12/01/21 0500 12/02/21 0434 12/04/21 0430 12/07/21 1458  WBC 7.2 8.5 11.4* 15.3*  NEUTROABS  --   --   --  10.6*  HGB 7.6* 8.0* 8.5* 9.8*  HCT 25.0* 26.9* 28.9* 32.8*  MCV 76.9* 78.2* 80.3 82.2  PLT 285 276 266 208   Cardiac Enzymes: No results for input(s): "CKTOTAL", "CKMB", "CKMBINDEX", "TROPONINI" in the last 168 hours.  BNP (last 3 results) Recent Labs    11/28/21 1540 12/07/21 1642  BNP 329.0* 116.0*    ProBNP (last 3 results) No results for input(s): "PROBNP" in the last 8760 hours.  CBG: Recent Labs  Lab 12/03/21 2057 12/04/21 0043 12/04/21 0451 12/04/21 0732 12/07/21 1901  GLUCAP 121* 120* 145* 142* 74    Radiological Exams on Admission: DG Chest Port 1 View  Result Date: 12/07/2021 CLINICAL DATA:  Questionable sepsis. Evaluate for abnormality. Hypoxemia. History of asthma/COPD. EXAM: PORTABLE CHEST 1 VIEW COMPARISON:  Radiographs 11/29/2021 and 11/28/2021.  CT 11/28/2021. FINDINGS: 1509 hours. The heart size and mediastinal contours appear stable. Compared with the most recent studies, there is partial clearing of the bibasilar airspace opacities, most consistent with improving pneumonia. No pneumothorax or significant pleural effusion identified. No acute osseous findings are seen. Telemetry leads overlie the chest. There is multilevel thoracic spondylosis. IMPRESSION: Interval partial clearing of bibasilar airspace opacities, most consistent with improving pneumonia. Continued follow-up recommended  to document complete clearing. No new findings identified. Electronically Signed   By: Richardean Sale M.D.   On: 12/07/2021 15:27    EKG: I independently viewed the EKG done and my findings are as followed: Sinus tachycardia at a rate of 117 bpm  Assessment/Plan Present on Admission:  Acute on chronic respiratory failure with hypoxia (HCC)  GERD (gastroesophageal reflux disease)  Multifocal pneumonia  Principal Problem:   Acute on chronic respiratory failure with hypoxia (HCC) Active Problems:   Dysphagia   Essential hypertension   Mixed hyperlipidemia   Peripheral neuropathy   Acute exacerbation of chronic obstructive pulmonary disease (COPD) (HCC)   GERD (gastroesophageal reflux disease)   Multifocal pneumonia   Lactic acidosis   Iron deficiency anemia  Acute on chronic respiratory failure with hypoxia O2 sat dropped to 80s on supplemental oxygen via [place] at 5 LPM and currently requiring HFNC at 15 L/min with an O2 sat of 100%.  This is possibly secondary to COPD exacerbation with ongoing, though improved multifocal pneumonia. Continue supplemental oxygen to maintain O2 sat of > 94% and wean patient's oxygen requirement down as tolerated  Acute exacerbation of COPD Continue duo nebs, Mucinex, Solu-Medrol, Singulair azithromycin. Continue Protonix to prevent steroid-induced ulcer Continue incentive spirometry and flutter valve Continue supplemental oxygen to maintain O2 sat > 94% with plan to wean patient off oxygen as tolerated  Multifocal pneumonia Patient was started on ceftriaxone and azithromycin, we shall continue same at this time with plan to de-escalate/discontinue based on blood culture, sputum culture, urine Legionella, strep pneumo and procalcitonin Continue Tylenol as needed Continue Mucinex, incentive spirometry, flutter valve   Lactic acidosis possibly due to hypoxia Lactic acid 2.8 > 2.3, continue to trend lactic acid  Elevated BNP BNP 116 (this was 329 about  9 days ago). Continue total input/output, daily weights and fluid restriction Continue heart healthy/modified carb diet  Echocardiogram in  the morning   Hypoalbuminemia probably secondary to mild protein calorie malnutrition Albumin 3.3, protein supplement will be provided  Dysphagia Last EGD on 04/2016 by Dr. Oneida Alar  showed no endoscopic esophageal abnormality to explain patient's dysphagia. Esophagus dilated per medical record Due to concern for aspiration, swallow eval will be done in the morning Consider gastroenterology consult if patient continues to have difficulty in swallowing.  T2DM Hemoglobin A1c on 10/8 was 6.1 Continue ISS and hypoglycemia protocol  Iron deficiency anemia Continue ferrous sulfate  Essential hypertension Continue losartan, Lopressor  Mixed hyperlipidemia Continue Lipitor  GERD Continue Protonix  Peripheral neuropathy Continue Neurontin   DVT prophylaxis: Lovenox   Advance Care Planning:   Code Status: Full Code   Consults: None  Family Communication: Son at bedside (all questions answered to satisfaction)  Severity of Illness: The appropriate patient status for this patient is INPATIENT. Inpatient status is judged to be reasonable and necessary in order to provide the required intensity of service to ensure the patient's safety. The patient's presenting symptoms, physical exam findings, and initial radiographic and laboratory data in the context of their chronic comorbidities is felt to place them at high risk for further clinical deterioration. Furthermore, it is not anticipated that the patient will be medically stable for discharge from the hospital within 2 midnights of admission.   * I certify that at the point of admission it is my clinical judgment that the patient will require inpatient hospital care spanning beyond 2 midnights from the point of admission due to high intensity of service, high risk for further deterioration and high  frequency of surveillance required.*  Author: Bernadette Hoit, DO 12/07/2021 9:10 PM  For on call review [email or web address].

## 2021-12-07 NOTE — ED Notes (Signed)
Miller, MD at bedside.  

## 2021-12-08 ENCOUNTER — Inpatient Hospital Stay (HOSPITAL_COMMUNITY): Payer: Medicare Other

## 2021-12-08 ENCOUNTER — Encounter (HOSPITAL_COMMUNITY): Payer: Self-pay | Admitting: Internal Medicine

## 2021-12-08 DIAGNOSIS — J9621 Acute and chronic respiratory failure with hypoxia: Secondary | ICD-10-CM | POA: Diagnosis not present

## 2021-12-08 DIAGNOSIS — R0602 Shortness of breath: Secondary | ICD-10-CM | POA: Diagnosis not present

## 2021-12-08 DIAGNOSIS — I509 Heart failure, unspecified: Secondary | ICD-10-CM | POA: Diagnosis not present

## 2021-12-08 DIAGNOSIS — J441 Chronic obstructive pulmonary disease with (acute) exacerbation: Secondary | ICD-10-CM | POA: Diagnosis not present

## 2021-12-08 DIAGNOSIS — E119 Type 2 diabetes mellitus without complications: Secondary | ICD-10-CM | POA: Diagnosis not present

## 2021-12-08 DIAGNOSIS — R1310 Dysphagia, unspecified: Secondary | ICD-10-CM | POA: Diagnosis not present

## 2021-12-08 LAB — GLUCOSE, CAPILLARY
Glucose-Capillary: 231 mg/dL — ABNORMAL HIGH (ref 70–99)
Glucose-Capillary: 258 mg/dL — ABNORMAL HIGH (ref 70–99)
Glucose-Capillary: 130 mg/dL — ABNORMAL HIGH (ref 70–99)
Glucose-Capillary: 175 mg/dL — ABNORMAL HIGH (ref 70–99)

## 2021-12-08 LAB — COMPREHENSIVE METABOLIC PANEL
ALT: 26 U/L (ref 0–44)
AST: 23 U/L (ref 15–41)
Albumin: 2.8 g/dL — ABNORMAL LOW (ref 3.5–5.0)
Alkaline Phosphatase: 45 U/L (ref 38–126)
Anion gap: 7 (ref 5–15)
BUN: 12 mg/dL (ref 8–23)
CO2: 25 mmol/L (ref 22–32)
Calcium: 10.1 mg/dL (ref 8.9–10.3)
Chloride: 106 mmol/L (ref 98–111)
Creatinine, Ser: 0.68 mg/dL (ref 0.44–1.00)
GFR, Estimated: >60 mL/min (ref >60)
Glucose, Bld: 292 mg/dL — ABNORMAL HIGH (ref 70–99)
Potassium: 4.5 mmol/L (ref 3.5–5.1)
Sodium: 138 mmol/L (ref 135–145)
Total Bilirubin: 0.5 mg/dL (ref 0.3–1.2)
Total Protein: 6.4 g/dL — ABNORMAL LOW (ref 6.5–8.1)

## 2021-12-08 LAB — HEMOGLOBIN A1C
Hgb A1c MFr Bld: 6.3 % — ABNORMAL HIGH (ref 4.8–5.6)
Mean Plasma Glucose: 134.11 mg/dL

## 2021-12-08 LAB — CBC
HCT: 29 % — ABNORMAL LOW (ref 36.0–46.0)
Hemoglobin: 8.6 g/dL — ABNORMAL LOW (ref 12.0–15.0)
MCH: 24.1 pg — ABNORMAL LOW (ref 26.0–34.0)
MCHC: 29.7 g/dL — ABNORMAL LOW (ref 30.0–36.0)
MCV: 81.2 fL (ref 80.0–100.0)
Platelets: 182 10*3/uL (ref 150–400)
RBC: 3.57 MIL/uL — ABNORMAL LOW (ref 3.87–5.11)
RDW: 26.5 % — ABNORMAL HIGH (ref 11.5–15.5)
WBC: 9.4 10*3/uL (ref 4.0–10.5)
nRBC: 0 % (ref 0.0–0.2)

## 2021-12-08 LAB — PHOSPHORUS: Phosphorus: 3.9 mg/dL (ref 2.5–4.6)

## 2021-12-08 LAB — ECHOCARDIOGRAM COMPLETE
AR max vel: 1.66 cm2
AV Area VTI: 2.03 cm2
AV Area mean vel: 1.69 cm2
AV Mean grad: 6 mmHg
AV Peak grad: 12.1 mmHg
Ao pk vel: 1.74 m/s
Area-P 1/2: 2.92 cm2
Height: 65 in
MV VTI: 2.38 cm2
S' Lateral: 3.2 cm
Weight: 2860.69 oz

## 2021-12-08 LAB — D-DIMER, QUANTITATIVE: D-Dimer, Quant: 3.32 ug/mL-FEU — ABNORMAL HIGH (ref 0.00–0.50)

## 2021-12-08 LAB — MAGNESIUM: Magnesium: 1.5 mg/dL — ABNORMAL LOW (ref 1.7–2.4)

## 2021-12-08 LAB — APTT: aPTT: 34 seconds (ref 24–36)

## 2021-12-08 LAB — PROCALCITONIN: Procalcitonin: <0.1 ng/mL

## 2021-12-08 MED ORDER — LACTATED RINGERS IV SOLN: INTRAVENOUS | Status: DC

## 2021-12-08 MED ORDER — GUAIFENESIN ER 600 MG PO TB12: 600.0000 mg | ORAL_TABLET | Freq: Two times a day (BID) | ORAL | Status: DC | PRN

## 2021-12-08 MED ORDER — INSULIN ASPART 100 UNIT/ML IJ SOLN
10.0000 [IU] | Freq: Three times a day (TID) | INTRAMUSCULAR | Status: DC
  Administered 2021-12-08 (×2): 10 [IU] via SUBCUTANEOUS

## 2021-12-08 MED ORDER — MAGNESIUM SULFATE 4 GM/100ML IV SOLN
4.0000 g | Freq: Once | INTRAVENOUS | Status: AC
  Administered 2021-12-08: 4 g via INTRAVENOUS
  Filled 2021-12-08: qty 100

## 2021-12-08 MED ORDER — APIXABAN 5 MG PO TABS
10.0000 mg | ORAL_TABLET | Freq: Two times a day (BID) | ORAL | Status: AC
  Administered 2021-12-08 – 2021-12-14 (×14): 10 mg via ORAL
  Filled 2021-12-08 (×13): qty 2

## 2021-12-08 MED ORDER — INSULIN GLARGINE-YFGN 100 UNIT/ML ~~LOC~~ SOLN: 20.0000 [IU] | Freq: Every day | SUBCUTANEOUS | Status: DC

## 2021-12-08 MED ORDER — UMECLIDINIUM BROMIDE 62.5 MCG/ACT IN AEPB
1.0000 | INHALATION_SPRAY | Freq: Every day | RESPIRATORY_TRACT | Status: DC
  Administered 2021-12-08 – 2021-12-15 (×8): 1 via RESPIRATORY_TRACT
  Filled 2021-12-08: qty 7

## 2021-12-08 MED ORDER — INSULIN ASPART 100 UNIT/ML IJ SOLN
0.0000 [IU] | INTRAMUSCULAR | Status: DC
  Administered 2021-12-08: 5 [IU] via SUBCUTANEOUS

## 2021-12-08 MED ORDER — MOMETASONE FURO-FORMOTEROL FUM 200-5 MCG/ACT IN AERO
2.0000 | INHALATION_SPRAY | Freq: Two times a day (BID) | RESPIRATORY_TRACT | Status: DC
  Administered 2021-12-08 – 2021-12-20 (×24): 2 via RESPIRATORY_TRACT
  Filled 2021-12-08: qty 8.8

## 2021-12-08 MED ORDER — POLYVINYL ALCOHOL 1.4 % OP SOLN
1.0000 [drp] | Freq: Four times a day (QID) | OPHTHALMIC | Status: DC
  Administered 2021-12-08 – 2021-12-20 (×48): 1 [drp] via OPHTHALMIC
  Filled 2021-12-08 (×6): qty 15

## 2021-12-08 MED ORDER — INSULIN ASPART 100 UNIT/ML IJ SOLN
0.0000 [IU] | Freq: Three times a day (TID) | INTRAMUSCULAR | Status: DC
  Administered 2021-12-08: 2 [IU] via SUBCUTANEOUS
  Administered 2021-12-08: 8 [IU] via SUBCUTANEOUS
  Administered 2021-12-09: 5 [IU] via SUBCUTANEOUS
  Administered 2021-12-09: 3 [IU] via SUBCUTANEOUS
  Administered 2021-12-09 – 2021-12-10 (×2): 2 [IU] via SUBCUTANEOUS
  Administered 2021-12-10: 5 [IU] via SUBCUTANEOUS
  Administered 2021-12-10: 3 [IU] via SUBCUTANEOUS
  Administered 2021-12-11: 8 [IU] via SUBCUTANEOUS
  Administered 2021-12-11: 15 [IU] via SUBCUTANEOUS
  Administered 2021-12-11 – 2021-12-12 (×3): 5 [IU] via SUBCUTANEOUS
  Administered 2021-12-12 – 2021-12-13 (×2): 8 [IU] via SUBCUTANEOUS
  Administered 2021-12-13: 3 [IU] via SUBCUTANEOUS
  Administered 2021-12-13: 11 [IU] via SUBCUTANEOUS
  Administered 2021-12-14 (×2): 3 [IU] via SUBCUTANEOUS
  Administered 2021-12-14: 2 [IU] via SUBCUTANEOUS
  Administered 2021-12-15: 5 [IU] via SUBCUTANEOUS
  Administered 2021-12-15: 2 [IU] via SUBCUTANEOUS
  Administered 2021-12-15 – 2021-12-16 (×2): 5 [IU] via SUBCUTANEOUS
  Administered 2021-12-16: 3 [IU] via SUBCUTANEOUS
  Administered 2021-12-17: 1 [IU] via SUBCUTANEOUS
  Administered 2021-12-17: 8 [IU] via SUBCUTANEOUS

## 2021-12-08 MED ORDER — GABAPENTIN 100 MG PO CAPS
100.0000 mg | ORAL_CAPSULE | Freq: Three times a day (TID) | ORAL | Status: DC
  Administered 2021-12-08 – 2021-12-20 (×37): 100 mg via ORAL
  Filled 2021-12-08 (×37): qty 1

## 2021-12-08 MED ORDER — INSULIN GLARGINE-YFGN 100 UNIT/ML ~~LOC~~ SOLN: 15.0000 [IU] | Freq: Every day | SUBCUTANEOUS | Status: DC

## 2021-12-08 MED ORDER — FUROSEMIDE 10 MG/ML IJ SOLN
40.0000 mg | Freq: Once | INTRAMUSCULAR | Status: AC
  Administered 2021-12-08: 40 mg via INTRAVENOUS
  Filled 2021-12-08: qty 4

## 2021-12-08 MED ORDER — INSULIN GLARGINE-YFGN 100 UNIT/ML ~~LOC~~ SOLN
30.0000 [IU] | Freq: Every day | SUBCUTANEOUS | Status: DC
  Administered 2021-12-08: 30 [IU] via SUBCUTANEOUS
  Filled 2021-12-08 (×3): qty 0.3

## 2021-12-08 MED ORDER — CHLORHEXIDINE GLUCONATE CLOTH 2 % EX PADS
6.0000 | MEDICATED_PAD | Freq: Every day | CUTANEOUS | Status: DC
  Administered 2021-12-08 – 2021-12-18 (×10): 6 via TOPICAL

## 2021-12-08 MED ORDER — IOHEXOL 350 MG/ML SOLN
75.0000 mL | Freq: Once | INTRAVENOUS | Status: AC | PRN
  Administered 2021-12-08: 75 mL via INTRAVENOUS

## 2021-12-08 MED ORDER — APIXABAN 5 MG PO TABS
5.0000 mg | ORAL_TABLET | Freq: Two times a day (BID) | ORAL | Status: DC
  Administered 2021-12-15 – 2021-12-20 (×10): 5 mg via ORAL
  Filled 2021-12-08 (×11): qty 1

## 2021-12-08 NOTE — Progress Notes (Signed)
*  PRELIMINARY RESULTS* Echocardiogram 2D Echocardiogram has been performed.  Michaela Moon 12/08/2021, 1:40 PM

## 2021-12-08 NOTE — Hospital Course (Signed)
86 y.o. female with medical history significant of hypertension, T2DM, COPD, dysphagia requiring dilation who presents to the emergency department due to shortness of breath. She was recently admitted from 10/4 to 10/10 due to acute on chronic hypoxemic respiratory failure secondary to multifocal pneumonia which was treated with IV Rocephin and azithromycin.  She completed a course of antibiotics while still in the hospital and O2 sat was weaned down to 4 to 5 L via [place].  It appeared that patient was on supplemental oxygen at 2 LPM on returning home after discharge.  Home oxygen was being monitored at home and when the home health nurse went to see patient yesterday, she was slightly hypoxic, home oxygen was monitored within the past 24 hours and was noted to have dropped down into the low 80s and the oxygen was increased to 5 L/min via [place], despite this, patient was still hypoxic with an O2 sat of 82% on arrival to the ED Patient was also recently admitted to [facility] from 9/2 to 9/12 due to multifocal pneumonia and acute respiratory failure with hypoxia due to COVID-19 during which she was transferred to ICU and she required BiPAP, she was treated with Paxlovid for 5 days and she continued Decadron and empiric antibiotic.  She was discharged with supplemental oxygen via [place] at 2 LPM   At bedside, patient was requiring supplemental oxygen via HFNC at 15 L, she was not in any acute distress.  There was concern regarding food getting stuck in her throat/chest.   ED Course:  In the emergency department, she was intermittently tachypneic, BP was 131/57, temperature 97.12F, pulse 100 bpm, O2 sat 95% on NRB.  Work-up in the ED showed WBC 15.3, hemoglobin 8.8, hematocrit 32.8, MCV 82.2, platelets 208.  BMP showed sodium 139, potassium 3.7, chloride 107, bicarb 24, glucose 88, BUN 11, creatinine 0.77.  Lactic acid 2.8 > 2.3, BNP 116 (this was 329 about 9 days ago).  Influenza A, B, SARS coronavirus 2 was negative.   Blood culture was pending. Chest x-ray showed Interval partial clearing of bibasilar airspace opacities, most consistent with improving pneumonia. Solu-Medrol 25 mg x 1 was given, she was started on IV ceftriaxone and azithromycin, IV hydration was provided.  Hospitalist was asked to admit patient for further evaluation and management.

## 2021-12-08 NOTE — Progress Notes (Signed)
ANTICOAGULATION CONSULT NOTE - Initial Consult  Pharmacy Consult for apixaban dosing Indication: DVT  Allergies  Allergen Reactions   Onion     wheezing    Patient Measurements: Height: '5\' 5"'$  (165.1 cm) Weight: 81.1 kg (178 lb 12.7 oz) IBW/kg (Calculated) : 57   Vital Signs: Temp: 97.5 F (36.4 C) (10/14 1200) Temp Source: Oral (10/14 1200) BP: 101/71 (10/14 1600) Pulse Rate: 77 (10/14 1600)  Labs: Recent Labs    12/07/21 1458 12/08/21 0344  HGB 9.8* 8.6*  HCT 32.8* 29.0*  PLT 208 182  APTT 28 34  LABPROT 13.6  --   INR 1.1  --   CREATININE 0.77 0.68    Estimated Creatinine Clearance: 55 mL/min (by C-G formula based on SCr of 0.68 mg/dL). Lab Results  Component Value Date   CREATININE 0.68 12/08/2021   CREATININE 0.77 12/07/2021   CREATININE 0.65 12/04/2021   Filed Weights   12/07/21 1427 12/07/21 1900  Weight: 80.7 kg (178 lb) 81.1 kg (178 lb 12.7 oz)     Medical History: Past Medical History:  Diagnosis Date   Arthritis    Asthma    COPD (chronic obstructive pulmonary disease) (HCC)    Diabetes mellitus without complication (HCC)    GERD (gastroesophageal reflux disease)    Headache    Hyperlipidemia    Hypertension    Macular degeneration    Neuropathy    Obesity    Osteopenia    Stress incontinence     Medications:  Medications Prior to Admission  Medication Sig Dispense Refill Last Dose   albuterol (PROVENTIL) (2.5 MG/3ML) 0.083% nebulizer solution Take 3 mLs (2.5 mg total) by nebulization every 6 (six) hours as needed for wheezing or shortness of breath. 360 mL 5 12/07/2021   albuterol-ipratropium (COMBIVENT) 18-103 MCG/ACT inhaler Inhale 2 puffs into the lungs every 6 (six) hours as needed for wheezing. 14.7 g 2 12/07/2021   BIOTIN PO Take by mouth.   12/06/2021   Budeson-Glycopyrrol-Formoterol (BREZTRI AEROSPHERE) 160-9-4.8 MCG/ACT AERO Inhale 2 puffs into the lungs in the morning and at bedtime. 10.7 g 11 12/07/2021   docusate  sodium (COLACE) 100 MG capsule Take 1 capsule (100 mg total) by mouth daily. 60 capsule 2 unknown   ferrous sulfate 325 (65 FE) MG tablet Take 1 tablet (325 mg total) by mouth daily. 30 tablet 3 12/06/2021   fluticasone (FLONASE) 50 MCG/ACT nasal spray Place 1 spray into both nostrils daily. 16 g 2 unknown   gabapentin (NEURONTIN) 400 MG capsule Take 400 mg by mouth 3 (three) times daily.   12/07/2021   glimepiride (AMARYL) 2 MG tablet Take 4 mg by mouth daily with breakfast.    12/07/2021   guaiFENesin (MUCINEX) 600 MG 12 hr tablet Take 2 tablets (1,200 mg total) by mouth 2 (two) times daily as needed for cough or to loosen phlegm.   12/07/2021   Hypromellose (ARTIFICIAL TEARS OP) Place 1 drop into both eyes 4 (four) times daily.   12/06/2021   ipratropium (ATROVENT) 0.03 % nasal spray Place 2 sprays into both nostrils every 12 (twelve) hours. 30 mL 12 12/06/2021   Ipratropium-Albuterol (COMBIVENT) 20-100 MCG/ACT AERS respimat Inhale 1 puff into the lungs every 6 (six) hours. 4 g 3    LANTUS SOLOSTAR 100 UNIT/ML Solostar Pen Inject 20 Units into the skin in the morning and at bedtime.   12/07/2021   losartan-hydrochlorothiazide (HYZAAR) 100-12.5 MG per tablet TAKE ONE (1) TABLET EACH DAY (Patient taking differently: Take 1  tablet by mouth daily.) 30 tablet 0 12/07/2021   metoprolol (LOPRESSOR) 50 MG tablet Take 50 mg by mouth 2 (two) times daily.   12/06/2021 at 2200   montelukast (SINGULAIR) 10 MG tablet TAKE ONE TABLET BY MOUTH AT BEDTIME 30 tablet 5 12/06/2021   Multiple Vitamins-Minerals (CENTRUM SILVER PO) Take 1 tablet by mouth daily.   12/06/2021   Omega-3 Fatty Acids (FISH OIL) 1000 MG CAPS Take 1,000 mg by mouth in the morning, at noon, and at bedtime.   12/07/2021   oxybutynin (DITROPAN) 5 MG tablet Take 5 mg by mouth every 8 (eight) hours as needed for bladder spasms.    unknown   pantoprazole (PROTONIX) 40 MG tablet Take 40 mg by mouth daily.   12/07/2021   sitaGLIPtin-metformin  (JANUMET) 50-1000 MG tablet Take 1 tablet by mouth 2 (two) times daily with a meal.   12/07/2021   triamcinolone cream (KENALOG) 0.1 % Apply 1 application topically daily as needed (eczema).   unknown   aspirin 81 MG tablet Take 81 mg by mouth daily.      atorvastatin (LIPITOR) 80 MG tablet ALTERNATE TAKING 1 TABLET EVERY OTHER DAY WITH 1/2 TABLET EVERY OTHERDAY 22 tablet 0    Azelastine HCl (ASTEPRO) 0.15 % SOLN Place 1 spray into the nose at bedtime. 30 mL 3    Biotin w/ Vitamins C & E (HAIR/SKIN/NAILS PO) Take 1 tablet by mouth daily. Takes once a day.      Scheduled:   apixaban  10 mg Oral BID   Followed by   Derrill Memo ON 12/15/2021] apixaban  5 mg Oral BID   aspirin  81 mg Oral Daily   atorvastatin  80 mg Oral Daily   [START ON 12/09/2021] Chlorhexidine Gluconate Cloth  6 each Topical Q0600   docusate sodium  100 mg Oral Daily   feeding supplement (GLUCERNA SHAKE)  237 mL Oral TID BM   ferrous sulfate  325 mg Oral Daily   gabapentin  100 mg Oral TID   insulin aspart  0-15 Units Subcutaneous TID WC   insulin aspart  10 Units Subcutaneous TID WC   insulin glargine-yfgn  30 Units Subcutaneous Daily   ipratropium-albuterol  3 mL Nebulization Q6H   losartan  100 mg Oral Daily   methylPREDNISolone (SOLU-MEDROL) injection  40 mg Intravenous Q12H   metoprolol tartrate  50 mg Oral BID   mometasone-formoterol  2 puff Inhalation BID   montelukast  10 mg Oral QHS   pantoprazole  40 mg Oral Daily   polyvinyl alcohol  1 drop Both Eyes QID   umeclidinium bromide  1 puff Inhalation Daily   Infusions:   azithromycin 500 mg (12/08/21 1603)   cefTRIAXone (ROCEPHIN)  IV 2 g (12/08/21 1450)   lactated ringers 75 mL/hr at 12/08/21 1246   PRN: acetaminophen **OR** acetaminophen, guaiFENesin, ipratropium-albuterol, ondansetron **OR** ondansetron (ZOFRAN) IV Anti-infectives (From admission, onward)    Start     Dose/Rate Route Frequency Ordered Stop   12/07/21 1500  cefTRIAXone (ROCEPHIN) 2 g in  sodium chloride 0.9 % 100 mL IVPB        2 g 200 mL/hr over 30 Minutes Intravenous Every 24 hours 12/07/21 1457 12/12/21 1459   12/07/21 1500  azithromycin (ZITHROMAX) 500 mg in sodium chloride 0.9 % 250 mL IVPB        500 mg 250 mL/hr over 60 Minutes Intravenous Every 24 hours 12/07/21 1457 12/12/21 1459       Assessment: Michaela Moon  a 86 y.o. female requires anticoagulation with apixaban for the indication of  DVT. Apixban will be started following pharmacy protocol per pharmacy consult.   Goal of Therapy:  Patient to be properly anticoagulated with apixaban Monitor platelets by anticoagulation protocol: yes   Plan:   Apixaban '10mg'$  po BID x 7 days, followed by apixaban '5mg'$  po BID, duration of therapy to be determined by following physician  CBC MWF Monitor for signs and symptoms of bleeding   Thomasenia Sales, PharmD Clinical Pharmacist 12/08/2021,4:40 PM

## 2021-12-08 NOTE — TOC Progression Note (Signed)
  Transition of Care [facility]) Screening Note   Patient Details  Name: Michaela Moon Date of Birth: Jun 30, 1935   Transition of Care [facility]) CM/SW Contact:    Shade Flood, LCSW Phone Number: 12/08/2021, 9:20 AM    Received [facility] consult for HH/DME needs. Pt in Stepdown on 30L HFNC O2. TOC will clear consult and follow up if HH/DME needs are identified when pt stable and closer to dc.  Transition of Care Department [facility]) has reviewed patient and no TOC needs have been identified at this time. We will continue to monitor patient advancement through interdisciplinary progression rounds. If new patient transition needs arise, please place a TOC consult.

## 2021-12-08 NOTE — Progress Notes (Signed)
PROGRESS NOTE   Michaela Moon  TIW:[id number] DOB: [birth year] DOA: 12/07/2021 PCP: [name], MD   Chief Complaint  Patient presents with   Shortness of Breath   Level of care: Stepdown  Brief Admission History:  86 y.o. female with medical history significant of hypertension, T2DM, COPD, dysphagia requiring dilation who presents to the emergency department due to shortness of breath. She was recently admitted from 10/4 to 10/10 due to acute on chronic hypoxemic respiratory failure secondary to multifocal pneumonia which was treated with IV Rocephin and azithromycin.  She completed a course of antibiotics while still in the hospital and O2 sat was weaned down to 4 to 5 L via [place].  It appeared that patient was on supplemental oxygen at 2 LPM on returning home after discharge.  Home oxygen was being monitored at home and when the home health nurse went to see patient yesterday, she was slightly hypoxic, home oxygen was monitored within the past 24 hours and was noted to have dropped down into the low 80s and the oxygen was increased to 5 L/min via [place], despite this, patient was still hypoxic with an O2 sat of 82% on arrival to the ED Patient was also recently admitted to [facility] from 9/2 to 9/12 due to multifocal pneumonia and acute respiratory failure with hypoxia due to COVID-19 during which she was transferred to ICU and she required BiPAP, she was treated with Paxlovid for 5 days and she continued Decadron and empiric antibiotic.  She was discharged with supplemental oxygen via [place] at 2 LPM   At bedside, patient was requiring supplemental oxygen via HFNC at 15 L, she was not in any acute distress.  There was concern regarding food getting stuck in her throat/chest.   ED Course:  In the emergency department, she was intermittently tachypneic, BP was 131/57, temperature 97.72F, pulse 100 bpm, O2 sat 95% on NRB.  Work-up in the ED showed WBC 15.3, hemoglobin 8.8, hematocrit 32.8, MCV  82.2, platelets 208.  BMP showed sodium 139, potassium 3.7, chloride 107, bicarb 24, glucose 88, BUN 11, creatinine 0.77.  Lactic acid 2.8 > 2.3, BNP 116 (this was 329 about 9 days ago).  Influenza A, B, SARS coronavirus 2 was negative.  Blood culture was pending. Chest x-ray showed Interval partial clearing of bibasilar airspace opacities, most consistent with improving pneumonia. Solu-Medrol 25 mg x 1 was given, she was started on IV ceftriaxone and azithromycin, IV hydration was provided.  Hospitalist was asked to admit patient for further evaluation and management.   Assessment and Plan:  Acute on chronic respiratory failure with hypoxia O2 sat dropped to 80s on supplemental oxygen via  at 5 LPM and now requiring HHFNC at 30 L/min  Goal pulse ox is >87% only  no need for pulse ox to be 100%  Acute exacerbation of COPD Concern about PE given recent covid infection and persistently high D dimer Obtain CTA to rule out PE  Continue duo nebs, Mucinex, Solu-Medrol, Singulair azithromycin. Continue Protonix to prevent steroid-induced ulcer Continue incentive spirometry and flutter valve Wean oxygen to maintain O2 sat >87% Follow up TTE Multiple recent hospitalizations and remains full code, will ask for palliative consult for goals of care discussions   Multifocal pneumonia - appears to be resolving on CXR Procalcitonin <0.10 which is reassuring Patient was started on ceftriaxone and azithromycin Follow up blood culture, sputum culture, urine Legionella, strep pneumo and procalcitonin Continue Tylenol as needed Continue Mucinex, incentive spirometry, flutter  valve    Lactic acidosis from hypoxia Lactic acid 2.8 > 2.3 trending down   Hypoalbuminemia probably secondary to mild protein calorie malnutrition Albumin 3.3, protein supplement will be provided   Dysphagia Last EGD on 04/2016 by Dr. Oneida Alar  showed no endoscopic esophageal abnormality to explain patient's dysphagia. Esophagus  dilated per medical record Due to concern for aspiration, swallow eval will be done in the morning   T2DM with steroid induced hyperglycemia  Hemoglobin A1c on 10/8 was 6.1 Continue ISS and hypoglycemia protocol Semglee 30 units plus novolog 10 units TID with meals, plus supplemental coverage and frequent CBG monitoring   Iron deficiency anemia Continue ferrous sulfate   Essential hypertension Continue losartan, Lopressor   Mixed hyperlipidemia Continue Lipitor   GERD Continue Protonix   Peripheral neuropathy Continue Neurontin    DVT prophylaxis: enoxaparin  Code Status: Full  Family Communication: son at bedside 10/14>> Disposition: Status is: Inpatient Remains inpatient appropriate because: IV antibiotics, IV steroids, high oxygen requirements   Consultants:   Procedures:   Antimicrobials:  Ceftriaxone / azithromycin 10/13>>  Subjective: Cough and chest congestion reported. No chest pain.   Objective: Vitals:   12/08/21 0600 12/08/21 0801 12/08/21 0815 12/08/21 1200  BP: (!) 160/42     Pulse: 62     Resp: 14     Temp:  (!) 97.5 F (36.4 C)  (!) 97.5 F (36.4 C)  TempSrc:  Oral  Oral  SpO2: 100%  99%   Weight:      Height:        Intake/Output Summary (Last 24 hours) at 12/08/2021 1216 Last data filed at 12/08/2021 0531 Gross per 24 hour  Intake 1511.57 ml  Output --  Net 1511.57 ml   Filed Weights   12/07/21 1427 12/07/21 1900  Weight: 80.7 kg 81.1 kg   Examination:  General exam: chronically ill appearing, awake, alert, eating breakfast, Appears calm and comfortable  Respiratory system: tachypneic, on HHFNC at 30L/m, rales heard bilaterally. Exp wheezing heard bilaterally. Cardiovascular system: normal S1 & S2 heard. No JVD, murmurs, rubs, gallops or clicks. No pedal edema. Gastrointestinal system: Abdomen is nondistended, soft and nontender. No organomegaly or masses felt. Normal bowel sounds heard. Central nervous system: Alert and oriented.  No focal neurological deficits. Extremities: Symmetric 5 x 5 power. Skin: No rashes, lesions or ulcers. Psychiatry: Judgement and insight appear normal. Mood & affect appropriate.   Data Reviewed: I have personally reviewed following labs and imaging studies  CBC: Recent Labs  Lab 12/02/21 0434 12/04/21 0430 12/07/21 1458 12/08/21 0344  WBC 8.5 11.4* 15.3* 9.4  NEUTROABS  --   --  10.6*  --   HGB 8.0* 8.5* 9.8* 8.6*  HCT 26.9* 28.9* 32.8* 29.0*  MCV 78.2* 80.3 82.2 81.2  PLT 276 266 208 413    Basic Metabolic Panel: Recent Labs  Lab 12/02/21 0434 12/04/21 0430 12/07/21 1458 12/08/21 0344  NA 140 139 139 138  K 4.0 4.1 3.7 4.5  CL 108 106 107 106  CO2 25 26 21* 25  GLUCOSE 137* 139* 88 292*  BUN '10 9 11 12  '$ CREATININE 0.60 0.65 0.77 0.68  CALCIUM 9.8 10.3 10.5* 10.1  MG 1.6* 1.6*  --  1.5*  PHOS  --   --   --  3.9    CBG: Recent Labs  Lab 12/04/21 0451 12/04/21 0732 12/07/21 1901 12/08/21 0730 12/08/21 1144  GLUCAP 145* 142* 74 231* 258*    Recent Results (from the  past 240 hour(s))  Resp Panel by RT-PCR (Flu A&B, Covid) Anterior Nasal Swab     Status: None   Collection Time: 11/28/21  1:00 PM   Specimen: Anterior Nasal Swab  Result Value Ref Range Status   SARS Coronavirus 2 by RT PCR NEGATIVE NEGATIVE Final    Comment: (NOTE) SARS-CoV-2 target nucleic acids are NOT DETECTED.  The SARS-CoV-2 RNA is generally detectable in upper respiratory specimens during the acute phase of infection. The lowest concentration of SARS-CoV-2 viral copies this assay can detect is 138 copies/mL. A negative result does not preclude SARS-Cov-2 infection and should not be used as the sole basis for treatment or other patient management decisions. A negative result may occur with  improper specimen collection/handling, submission of specimen other than nasopharyngeal swab, presence of viral mutation(s) within the areas targeted by this assay, and inadequate number of  viral copies(<138 copies/mL). A negative result must be combined with clinical observations, patient history, and epidemiological information. The expected result is Negative.  Fact Sheet for Patients:  EntrepreneurPulse.com.au  Fact Sheet for Healthcare Providers:  IncredibleEmployment.be  This test is no t yet approved or cleared by the [place] FDA and  has been authorized for detection and/or diagnosis of SARS-CoV-2 by FDA under an Emergency Use Authorization (EUA). This EUA will remain  in effect (meaning this test can be used) for the duration of the COVID-19 declaration under Section 564(b)(1) of the Act, 21 U.S.C.section 360bbb-3(b)(1), unless the authorization is terminated  or revoked sooner.       Influenza A by PCR NEGATIVE NEGATIVE Final   Influenza B by PCR NEGATIVE NEGATIVE Final    Comment: (NOTE) The Xpert Xpress SARS-CoV-2/FLU/RSV plus assay is intended as an aid in the diagnosis of influenza from Nasopharyngeal swab specimens and should not be used as a sole basis for treatment. Nasal washings and aspirates are unacceptable for Xpert Xpress SARS-CoV-2/FLU/RSV testing.  Fact Sheet for Patients: EntrepreneurPulse.com.au  Fact Sheet for Healthcare Providers: IncredibleEmployment.be  This test is not yet approved or cleared by the [place] FDA and has been authorized for detection and/or diagnosis of SARS-CoV-2 by FDA under an Emergency Use Authorization (EUA). This EUA will remain in effect (meaning this test can be used) for the duration of the COVID-19 declaration under Section 564(b)(1) of the Act, 21 U.S.C. section 360bbb-3(b)(1), unless the authorization is terminated or revoked.  Performed at [facility], [address] [place], [place] [address]   MRSA Next Gen by PCR, Nasal     Status: None   Collection Time: 11/28/21  5:58 PM   Specimen: Nasal Mucosa; Nasal Swab   Result Value Ref Range Status   MRSA by PCR Next Gen NOT DETECTED NOT DETECTED Final    Comment: (NOTE) The GeneXpert MRSA Assay (FDA approved for NASAL specimens only), is one component of a comprehensive MRSA colonization surveillance program. It is not intended to diagnose MRSA infection nor to guide or monitor treatment for MRSA infections. Test performance is not FDA approved in patients less than 9 years old. Performed at [facility], [address] [place], [place] [address]   Resp Panel by RT-PCR (Flu A&B, Covid) Anterior Nasal Swab     Status: None   Collection Time: 12/07/21  2:58 PM   Specimen: Anterior Nasal Swab  Result Value Ref Range Status   SARS Coronavirus 2 by RT PCR NEGATIVE NEGATIVE Final    Comment: (NOTE) SARS-CoV-2 target nucleic acids are NOT DETECTED.  The SARS-CoV-2  RNA is generally detectable in upper respiratory specimens during the acute phase of infection. The lowest concentration of SARS-CoV-2 viral copies this assay can detect is 138 copies/mL. A negative result does not preclude SARS-Cov-2 infection and should not be used as the sole basis for treatment or other patient management decisions. A negative result may occur with  improper specimen collection/handling, submission of specimen other than nasopharyngeal swab, presence of viral mutation(s) within the areas targeted by this assay, and inadequate number of viral copies(<138 copies/mL). A negative result must be combined with clinical observations, patient history, and epidemiological information. The expected result is Negative.  Fact Sheet for Patients:  EntrepreneurPulse.com.au  Fact Sheet for Healthcare Providers:  IncredibleEmployment.be  This test is no t yet approved or cleared by the [place] FDA and  has been authorized for detection and/or diagnosis of SARS-CoV-2 by FDA under an Emergency Use Authorization (EUA). This EUA will remain   in effect (meaning this test can be used) for the duration of the COVID-19 declaration under Section 564(b)(1) of the Act, 21 U.S.C.section 360bbb-3(b)(1), unless the authorization is terminated  or revoked sooner.       Influenza A by PCR NEGATIVE NEGATIVE Final   Influenza B by PCR NEGATIVE NEGATIVE Final    Comment: (NOTE) The Xpert Xpress SARS-CoV-2/FLU/RSV plus assay is intended as an aid in the diagnosis of influenza from Nasopharyngeal swab specimens and should not be used as a sole basis for treatment. Nasal washings and aspirates are unacceptable for Xpert Xpress SARS-CoV-2/FLU/RSV testing.  Fact Sheet for Patients: EntrepreneurPulse.com.au  Fact Sheet for Healthcare Providers: IncredibleEmployment.be  This test is not yet approved or cleared by the [place] FDA and has been authorized for detection and/or diagnosis of SARS-CoV-2 by FDA under an Emergency Use Authorization (EUA). This EUA will remain in effect (meaning this test can be used) for the duration of the COVID-19 declaration under Section 564(b)(1) of the Act, 21 U.S.C. section 360bbb-3(b)(1), unless the authorization is terminated or revoked.  Performed at [facility], [address], [place], [place] [address]   Blood Culture (routine x 2)     Status: None (Preliminary result)   Collection Time: 12/07/21  2:58 PM   Specimen: BLOOD RIGHT FOREARM  Result Value Ref Range Status   Specimen Description BLOOD RIGHT FOREARM  Final   Special Requests   Final    BOTTLES DRAWN AEROBIC AND ANAEROBIC Blood Culture adequate volume   Culture   Final    NO GROWTH < 24 HOURS Performed at [facility], [address], [place], [place] [address]    Report Status PENDING  Incomplete  Blood Culture (routine x 2)     Status: None (Preliminary result)   Collection Time: 12/07/21  3:03 PM   Specimen: Right Antecubital; Blood  Result Value Ref Range Status   Specimen Description  RIGHT ANTECUBITAL  Final   Special Requests   Final    BOTTLES DRAWN AEROBIC AND ANAEROBIC Blood Culture results may not be optimal due to an inadequate volume of blood received in culture bottles   Culture   Final    NO GROWTH < 24 HOURS Performed at [facility], [address] [place], [place] [address]    Report Status PENDING  Incomplete     Radiology Studies: CT Angio Chest Pulmonary Embolism (PE) W or WO Contrast  Result Date: 12/08/2021 CLINICAL DATA:  Positive D-dimer.  DVT EXAM: CT ANGIOGRAPHY CHEST WITH CONTRAST TECHNIQUE: Multidetector CT imaging of the  chest was performed using the standard protocol during bolus administration of intravenous contrast. Multiplanar CT image reconstructions and MIPs were obtained to evaluate the vascular anatomy. RADIATION DOSE REDUCTION: This exam was performed according to the departmental dose-optimization program which includes automated exposure control, adjustment of the mA and/or kV according to patient size and/or use of iterative reconstruction technique. CONTRAST:  77m OMNIPAQUE IOHEXOL 350 MG/ML SOLN COMPARISON:  11/28/2021 FINDINGS: Cardiovascular: Satisfactory opacification of the pulmonary arteries to the segmental level. No evidence of pulmonary embolism. Normal heart size. No pericardial effusion. Extensive atheromatous calcification of the aorta and coronaries Mediastinum/Nodes: Negative for adenopathy. Lungs/Pleura: Unchanged pattern of multifocal consolidation affecting all lobes with ground-glass, nodular, and consolidative opacities. Lower lobe airspace opacity is new/progressed from July 2023 but remaining opacity is very similar. Central airways are clear. Mild emphysematous change. In an otherwise clear area of lung there is an 8 mm irregular pulmonary nodule which is known from prior CTs. Upper Abdomen: Atheromatous plaque. Dystrophic type calcification in the right posterior liver. Granulomatous calcifications in the spleen.  Musculoskeletal: No acute finding. Generalized thoracic spine degeneration. Review of the MIP images confirms the above findings. IMPRESSION: 1. Negative for pulmonary embolism. 2. Multi lobar airspace disease much of which is chronic when compared to a July 2023 CT. Lingular opacity is definitely chronic when compared to a January 2023 CT, and progressive. Consider atypical and non infectious pneumonias. Consider alveolar tumor. 3. Known spiculated nodule in the right upper lobe. Electronically Signed   By: JJorje GuildM.D.   On: 12/08/2021 12:02   [place]ous Img Lower Bilateral (DVT)  Result Date: 12/08/2021 CLINICAL DATA:  Bilateral lower extremity edema. EXAM: BILATERAL LOWER EXTREMITY VENOUS DOPPLER ULTRASOUND TECHNIQUE: Gray-scale sonography with graded compression, as well as color Doppler and duplex ultrasound were performed to evaluate the lower extremity deep venous systems from the level of the common femoral vein and including the common femoral, femoral, profunda femoral, popliteal and calf veins including the posterior tibial, peroneal and gastrocnemius veins when visible. The superficial great saphenous vein was also interrogated. Spectral Doppler was utilized to evaluate flow at rest and with distal augmentation maneuvers in the common femoral, femoral and popliteal veins. COMPARISON:  None Available. FINDINGS: RIGHT LOWER EXTREMITY Common Femoral Vein: No evidence of thrombus. Normal compressibility, respiratory phasicity and response to augmentation. Saphenofemoral Junction: No evidence of thrombus. Normal compressibility and flow on color Doppler imaging. Profunda Femoral Vein: No evidence of thrombus. Normal compressibility and flow on color Doppler imaging. Femoral Vein: No evidence of thrombus. Normal compressibility, respiratory phasicity and response to augmentation. Popliteal Vein: No evidence of thrombus. Normal compressibility, respiratory phasicity and response to augmentation.  Calf Veins: Normal patency of posterior tibial vein. There is some visualized thrombus in the right peroneal vein which appears occlusive in the proximal calf. Gastrocnemius thrombus identified in the right calf. Superficial Great Saphenous Vein: No evidence of thrombus. Normal compressibility. Venous Reflux:  None. Other Findings: No evidence of superficial thrombophlebitis or abnormal fluid collection. LEFT LOWER EXTREMITY Common Femoral Vein: No evidence of thrombus. Normal compressibility, respiratory phasicity and response to augmentation. Saphenofemoral Junction: No evidence of thrombus. Normal compressibility and flow on color Doppler imaging. Profunda Femoral Vein: No evidence of thrombus. Normal compressibility and flow on color Doppler imaging. Femoral Vein: No evidence of thrombus. Normal compressibility, respiratory phasicity and response to augmentation. Popliteal Vein: No evidence of thrombus. Normal compressibility, respiratory phasicity and response to augmentation. Calf Veins: Posterior tibial vein is normally patent. Left  peroneal vein DVT visualized. Superficial Great Saphenous Vein: No evidence of thrombus. Normal compressibility. Venous Reflux:  None. Other Findings: No evidence of superficial thrombophlebitis or abnormal fluid collection. IMPRESSION: Positive for bilateral calf DVT in the peroneal veins and right gastrocnemius vein. Electronically Signed   By: Aletta Edouard M.D.   On: 12/08/2021 11:50   DG Chest Port 1 View  Result Date: 12/07/2021 CLINICAL DATA:  Questionable sepsis. Evaluate for abnormality. Hypoxemia. History of asthma/COPD. EXAM: PORTABLE CHEST 1 VIEW COMPARISON:  Radiographs 11/29/2021 and 11/28/2021.  CT 11/28/2021. FINDINGS: 1509 hours. The heart size and mediastinal contours appear stable. Compared with the most recent studies, there is partial clearing of the bibasilar airspace opacities, most consistent with improving pneumonia. No pneumothorax or significant  pleural effusion identified. No acute osseous findings are seen. Telemetry leads overlie the chest. There is multilevel thoracic spondylosis. IMPRESSION: Interval partial clearing of bibasilar airspace opacities, most consistent with improving pneumonia. Continued follow-up recommended to document complete clearing. No new findings identified. Electronically Signed   By: Richardean Sale M.D.   On: 12/07/2021 15:27    Scheduled Meds:  aspirin  81 mg Oral Daily   atorvastatin  80 mg Oral Daily   [START ON 12/09/2021] Chlorhexidine Gluconate Cloth  6 each Topical Q0600   docusate sodium  100 mg Oral Daily   enoxaparin (LOVENOX) injection  40 mg Subcutaneous Q24H   feeding supplement (GLUCERNA SHAKE)  237 mL Oral TID BM   ferrous sulfate  325 mg Oral Daily   gabapentin  100 mg Oral TID   insulin aspart  0-15 Units Subcutaneous TID WC   insulin aspart  10 Units Subcutaneous TID WC   insulin glargine-yfgn  30 Units Subcutaneous Daily   ipratropium-albuterol  3 mL Nebulization Q6H   losartan  100 mg Oral Daily   methylPREDNISolone (SOLU-MEDROL) injection  40 mg Intravenous Q12H   metoprolol tartrate  50 mg Oral BID   mometasone-formoterol  2 puff Inhalation BID   montelukast  10 mg Oral QHS   pantoprazole  40 mg Oral Daily   polyvinyl alcohol  1 drop Both Eyes QID   umeclidinium bromide  1 puff Inhalation Daily   Continuous Infusions:  azithromycin Stopped (12/07/21 1712)   cefTRIAXone (ROCEPHIN)  IV Stopped (12/07/21 1555)   lactated ringers     magnesium sulfate bolus IVPB 4 g (12/08/21 1039)     LOS: 1 day   Critical Care Procedure Note Authorized and Performed by: Murvin Natal MD  Total Critical Care time:  40 mins Due to a high probability of clinically significant, life threatening deterioration, the patient required my highest level of preparedness to intervene emergently and I personally spent this critical care time directly and personally managing the patient.  This critical care  time included obtaining a history; examining the patient, pulse oximetry; ordering and review of studies; arranging urgent treatment with development of a management plan; evaluation of patient's response of treatment; frequent reassessment; and discussions with other providers.  This critical care time was performed to assess and manage the high probability of imminent and life threatening deterioration that could result in multi-organ failure.  It was exclusive of separately billable procedures and treating other patients and teaching time.    Irwin Brakeman, MD How to contact the [facility] Attending or Consulting provider [place] or covering provider during after hours [place], for this patient?  Check the care team in [facility] and look for a) attending/consulting [facility] provider listed  and b) the [facility] team listed Log into [email or web address] and use [place]'s universal password to access. If you do not have the password, please contact the hospital operator. Locate the [facility] provider you are looking for under [facility] and page to a number that you can be directly reached. If you still have difficulty reaching the provider, please page the [facility] (Director on Call) for the Hospitalists listed on amion for assistance.  12/08/2021, 12:16 PM

## 2021-12-09 DIAGNOSIS — E119 Type 2 diabetes mellitus without complications: Secondary | ICD-10-CM | POA: Diagnosis not present

## 2021-12-09 DIAGNOSIS — J9621 Acute and chronic respiratory failure with hypoxia: Secondary | ICD-10-CM | POA: Diagnosis not present

## 2021-12-09 DIAGNOSIS — R1310 Dysphagia, unspecified: Secondary | ICD-10-CM | POA: Diagnosis not present

## 2021-12-09 DIAGNOSIS — J441 Chronic obstructive pulmonary disease with (acute) exacerbation: Secondary | ICD-10-CM | POA: Diagnosis not present

## 2021-12-09 LAB — BASIC METABOLIC PANEL
Anion gap: 6 (ref 5–15)
BUN: 15 mg/dL (ref 8–23)
CO2: 28 mmol/L (ref 22–32)
Calcium: 10.1 mg/dL (ref 8.9–10.3)
Chloride: 104 mmol/L (ref 98–111)
Creatinine, Ser: 0.7 mg/dL (ref 0.44–1.00)
GFR, Estimated: >60 mL/min (ref >60)
Glucose, Bld: 297 mg/dL — ABNORMAL HIGH (ref 70–99)
Potassium: 4.9 mmol/L (ref 3.5–5.1)
Sodium: 138 mmol/L (ref 135–145)

## 2021-12-09 LAB — CBC
HCT: 28.8 % — ABNORMAL LOW (ref 36.0–46.0)
Hemoglobin: 8.5 g/dL — ABNORMAL LOW (ref 12.0–15.0)
MCH: 24.2 pg — ABNORMAL LOW (ref 26.0–34.0)
MCHC: 29.5 g/dL — ABNORMAL LOW (ref 30.0–36.0)
MCV: 82.1 fL (ref 80.0–100.0)
Platelets: 170 10*3/uL (ref 150–400)
RBC: 3.51 MIL/uL — ABNORMAL LOW (ref 3.87–5.11)
RDW: 26.9 % — ABNORMAL HIGH (ref 11.5–15.5)
WBC: 12.1 10*3/uL — ABNORMAL HIGH (ref 4.0–10.5)
nRBC: 0 % (ref 0.0–0.2)

## 2021-12-09 LAB — GLUCOSE, CAPILLARY
Glucose-Capillary: 304 mg/dL — ABNORMAL HIGH (ref 70–99)
Glucose-Capillary: 214 mg/dL — ABNORMAL HIGH (ref 70–99)
Glucose-Capillary: 132 mg/dL — ABNORMAL HIGH (ref 70–99)
Glucose-Capillary: 181 mg/dL — ABNORMAL HIGH (ref 70–99)
Glucose-Capillary: 150 mg/dL — ABNORMAL HIGH (ref 70–99)

## 2021-12-09 LAB — MAGNESIUM: Magnesium: 1.8 mg/dL (ref 1.7–2.4)

## 2021-12-09 MED ORDER — HYDRALAZINE HCL 25 MG PO TABS
50.0000 mg | ORAL_TABLET | Freq: Three times a day (TID) | ORAL | Status: DC
  Administered 2021-12-09 – 2021-12-20 (×27): 50 mg via ORAL
  Filled 2021-12-09 (×31): qty 2

## 2021-12-09 MED ORDER — HYDRALAZINE HCL 25 MG PO TABS
25.0000 mg | ORAL_TABLET | Freq: Three times a day (TID) | ORAL | Status: DC
  Administered 2021-12-09: 25 mg via ORAL
  Filled 2021-12-09: qty 1

## 2021-12-09 MED ORDER — INSULIN ASPART 100 UNIT/ML IJ SOLN
14.0000 [IU] | Freq: Three times a day (TID) | INTRAMUSCULAR | Status: DC
  Administered 2021-12-09 – 2021-12-11 (×7): 14 [IU] via SUBCUTANEOUS

## 2021-12-09 MED ORDER — MAGNESIUM SULFATE 4 GM/100ML IV SOLN
4.0000 g | Freq: Once | INTRAVENOUS | Status: AC
  Administered 2021-12-09: 4 g via INTRAVENOUS
  Filled 2021-12-09: qty 100

## 2021-12-09 MED ORDER — HYDRALAZINE HCL 20 MG/ML IJ SOLN: 10.0000 mg | INTRAMUSCULAR | Status: DC | PRN

## 2021-12-09 MED ORDER — GUAIFENESIN ER 600 MG PO TB12
600.0000 mg | ORAL_TABLET | Freq: Two times a day (BID) | ORAL | Status: AC
  Administered 2021-12-09 – 2021-12-11 (×6): 600 mg via ORAL
  Filled 2021-12-09 (×6): qty 1

## 2021-12-09 MED ORDER — INSULIN GLARGINE-YFGN 100 UNIT/ML ~~LOC~~ SOLN
40.0000 [IU] | Freq: Every day | SUBCUTANEOUS | Status: DC
  Administered 2021-12-09 – 2021-12-10 (×2): 40 [IU] via SUBCUTANEOUS
  Filled 2021-12-09 (×4): qty 0.4

## 2021-12-09 NOTE — Progress Notes (Signed)
Pt tolerated wean O2 well, still sound very rhonchorous and congested. BP elevated may need adjustments in BP meds.

## 2021-12-09 NOTE — Evaluation (Signed)
Clinical/Bedside Swallow Evaluation Patient Details  Name: Michaela Moon MRN: [id number] Date of Birth: 06-Oct-1935  Today's Date: 12/09/2021 Time: SLP Start Time (ACUTE ONLY): 35 SLP Stop Time (ACUTE ONLY): 2423 SLP Time Calculation (min) (ACUTE ONLY): 29 min  Past Medical History:  Past Medical History:  Diagnosis Date   Arthritis    Asthma    COPD (chronic obstructive pulmonary disease) ([place])    Diabetes mellitus without complication (HCC)    GERD (gastroesophageal reflux disease)    Headache    Hyperlipidemia    Hypertension    Macular degeneration    Neuropathy    Obesity    Osteopenia    Stress incontinence    Past Surgical History:  Past Surgical History:  Procedure Laterality Date   BREAST LUMPECTOMY Bilateral    no cancer   CARPAL TUNNEL RELEASE Left 10/23/2017   Procedure: LEFT CARPAL TUNNEL RELEASE;  Surgeon: Carole Civil, MD;  Location: [facility];  Service: Orthopedics;  Laterality: Left;   CARPAL TUNNEL RELEASE Right 11/20/2017   Procedure: CARPAL TUNNEL RELEASE;  Surgeon: Carole Civil, MD;  Location: [facility];  Service: Orthopedics;  Laterality: Right;   CATARACT EXTRACTION W/PHACO Left 11/01/2016   Procedure: CATARACT EXTRACTION PHACO AND INTRAOCULAR LENS PLACEMENT (IOC);  Surgeon: Baruch Goldmann, MD;  Location: [facility];  Service: Ophthalmology;  Laterality: Left;  CDE: 3.83   CATARACT EXTRACTION W/PHACO Right 11/29/2016   Procedure: CATARACT EXTRACTION PHACO AND INTRAOCULAR LENS PLACEMENT RIGHT EYE;  Surgeon: Baruch Goldmann, MD;  Location: [facility];  Service: Ophthalmology;  Laterality: Right;  CDE: 8.71   colon tumor removed  2010   Dr. 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 submucosal lipoma on path   COLONOSCOPY  05/2013   Dr. Anthony Sar: normal. (h/o colon polyps)   COLONOSCOPY  2011   normal   COLONOSCOPY  10/2003   Dr. Lindalou Hose: large polypoid mass hepatic fluexure   ESOPHAGOGASTRODUODENOSCOPY N/A 04/29/2016    Procedure: ESOPHAGOGASTRODUODENOSCOPY (EGD);  Surgeon: Danie Binder, MD;  Location: [facility];  Service: Endoscopy;  Laterality: N/A;  9:15am   SAVORY DILATION N/A 04/29/2016   Procedure: SAVORY DILATION;  Surgeon: Danie Binder, MD;  Location: [facility];  Service: Endoscopy;  Laterality: N/A;   HPI:  86 y.o. female with medical history significant of hypertension, T2DM, COPD, dysphagia requiring dilation who presents to the emergency department due to shortness of breath.  She was recently admitted from 10/4 to 10/10 due to acute on chronic hypoxemic respiratory failure secondary to multifocal pneumonia which was treated with IV Rocephin and azithromycin. Prior to this, she was admitted at [facility]- admitted 9/2 to 9/12 for acute respiratory failure, required BiPAP.  She was positive for COVID and treated with antibiotics and Paxlovid for multifocal pneumonia. [name] reports to me she has had PNA ~5-7 times in the past 2 years. CXR reveals: "Interval partial clearing of bibasilar airspace opacities, most  consistent with improving pneumonia. Continued follow-up recommended  to document complete clearing. No new findings identified." BSE requested.    Assessment / Plan / Recommendation  Clinical Impression  Clinical swallow evaluation completed- [name] present. Pt was recently evaluated and seen by our ST team during last hospitalization (10/4-10/10). Pt with known hx of esophageal dysphagia and esophageal dilation. Pt reports frequent episodes of getting "choked or strangled" with meals and liquids.  Pt however consumed regular textures, puree textrues and thin liquids without overt s/sx of aspiration.  Pt remains at risk for aspiration and note this ST team planned for MBSS last hospitalization, but secondary to scheduling and medical stability was not completed. Note She further reports continued globus sensation, early satiety and episodes of regurgitation. Recommend continue with  D3/mech soft diet and thin liquids at this time; further Suggest consider MBSS this hospitalization if schedule can accommodate it tomorrow (or can be completed as an outpatient) given history of PNA, Pt/family reports of dysphagia to solid foods, and compromised respiratory status. Based on her report of esophageal symptoms, question esophageal dysmotility negatively impacting efficient PO intake and note Pt has not followed up with GI since 2018. Suggest consider GI consult. SLP will follow, thank you SLP Visit Diagnosis: Dysphagia, unspecified (R13.10)    Aspiration Risk  Mild aspiration risk    Diet Recommendation Dysphagia 3 (Mech soft);Thin liquid   Liquid Administration via: Cup;Straw Medication Administration: Whole meds with liquid Supervision: Patient able to self feed;Intermittent supervision to cue for compensatory strategies Compensations: Slow rate Postural Changes: Seated upright at 90 degrees    Other  Recommendations Recommended Consults: Consider esophageal assessment;Consider GI evaluation Oral Care Recommendations: Oral care BID;Staff/trained caregiver to provide oral care Other Recommendations: Clarify dietary restrictions    Recommendations for follow up therapy are one component of a multi-disciplinary discharge planning process, led by the attending physician.  Recommendations may be updated based on patient status, additional functional criteria and insurance authorization.  Follow up Recommendations Follow physician's recommendations for discharge plan and follow up therapies      Assistance Recommended at Discharge Intermittent Supervision/Assistance  Functional Status Assessment Patient has had a recent decline in their functional status and demonstrates the ability to make significant improvements in function in a reasonable and predictable amount of time.  Frequency and Duration min 2x/week  1 week       Prognosis Prognosis for Safe Diet Advancement: Good       Swallow Study   General HPI: 86 y.o. female with medical history significant of hypertension, T2DM, COPD, dysphagia requiring dilation who presents to the emergency department due to shortness of breath.  She was recently admitted from 10/4 to 10/10 due to acute on chronic hypoxemic respiratory failure secondary to multifocal pneumonia which was treated with IV Rocephin and azithromycin. Prior to this, she was admitted at [facility]- admitted 9/2 to 9/12 for acute respiratory failure, required BiPAP.  She was positive for COVID and treated with antibiotics and Paxlovid for multifocal pneumonia. [name] reports to me she has had PNA ~5-7 times in the past 2 years. CXR reveals: "Interval partial clearing of bibasilar airspace opacities, most  consistent with improving pneumonia. Continued follow-up recommended  to document complete clearing. No new findings identified." BSE requested. Type of Study: Bedside Swallow Evaluation Previous Swallow Assessment: EGD in 2018 with dilation Diet Prior to this Study: Thin liquids;Regular Temperature Spikes Noted: No Respiratory Status: Nasal cannula History of Recent Intubation: No Behavior/Cognition: Alert;Cooperative;Pleasant mood Oral Cavity Assessment: Within Functional Limits Oral Care Completed by SLP: Recent completion by staff Oral Cavity - Dentition: Dentures, top;Dentures, bottom Vision: Functional for self-feeding Self-Feeding Abilities: Able to feed self Patient Positioning: Upright in bed Baseline Vocal Quality: Normal Volitional Cough: Weak;Congested Volitional Swallow: Able to elicit    Oral/Motor/Sensory Function Overall Oral Motor/Sensory Function: Within functional limits   Ice Chips Ice chips: Within functional limits   Thin Liquid Thin Liquid: Within functional limits    Nectar Thick Nectar Thick Liquid: Not tested   Honey Thick  Honey Thick Liquid: Not tested   Puree Puree: Within functional limits   Solid     Solid:  Within functional limits Presentation: [place]. Roddie Mc, [place] Speech Language Pathologist  [name] 12/09/2021,4:09 PM

## 2021-12-09 NOTE — ED Notes (Signed)
Patient may be able to come of heated high flow oxygen and switch to saulter. Nasal cannula

## 2021-12-09 NOTE — Progress Notes (Addendum)
PROGRESS NOTE   Michaela Moon  MGN:[id number] DOB: 1935-08-13 DOA: 12/07/2021 PCP: [name], MD   Chief Complaint  Patient presents with   Shortness of Breath   Level of care: Stepdown  Brief Admission History:  86 y.o. female with medical history significant of hypertension, T2DM, COPD, dysphagia requiring dilation who presents to the emergency department due to shortness of breath. She was recently admitted from 10/4 to 10/10 due to acute on chronic hypoxemic respiratory failure secondary to multifocal pneumonia which was treated with IV Rocephin and azithromycin.  She completed a course of antibiotics while still in the hospital and O2 sat was weaned down to 4 to 5 L via [place].  It appeared that patient was on supplemental oxygen at 2 LPM on returning home after discharge.  Home oxygen was being monitored at home and when the home health nurse went to see patient yesterday, she was slightly hypoxic, home oxygen was monitored within the past 24 hours and was noted to have dropped down into the low 80s and the oxygen was increased to 5 L/min via [place], despite this, patient was still hypoxic with an O2 sat of 82% on arrival to the ED Patient was also recently admitted to [facility] from 9/2 to 9/12 due to multifocal pneumonia and acute respiratory failure with hypoxia due to COVID-19 during which she was transferred to ICU and she required BiPAP, she was treated with Paxlovid for 5 days and she continued Decadron and empiric antibiotic.  She was discharged with supplemental oxygen via [place] at 2 LPM   At bedside, patient was requiring supplemental oxygen via [facility] at 15 L, she was not in any acute distress.  There was concern regarding food getting stuck in her throat/chest.   ED Course:  In the emergency department, she was intermittently tachypneic, BP was 131/57, temperature 97.32F, pulse 100 bpm, O2 sat 95% on NRB.  Work-up in the ED showed WBC 15.3, hemoglobin 8.8, hematocrit 32.8, MCV  82.2, platelets 208.  BMP showed sodium 139, potassium 3.7, chloride 107, bicarb 24, glucose 88, BUN 11, creatinine 0.77.  Lactic acid 2.8 > 2.3, BNP 116 (this was 329 about 9 days ago).  Influenza A, B, SARS coronavirus 2 was negative.  Blood culture was pending.  Chest x-ray showed Interval partial clearing of bibasilar airspace opacities, most consistent with improving pneumonia. Solu-Medrol 25 mg x 1 was given, she was started on IV ceftriaxone and azithromycin, IV hydration was provided.  Hospitalist was asked to admit patient for further evaluation and management.   Assessment and Plan:  Acute on chronic respiratory failure with hypoxia O2 sat dropped to 80s on supplemental oxygen via [place] at 5 LPM and required up to [facility] at 30 L/min to maintain sats Goal pulse ox is >87% only  no need for pulse ox to be 100%  Goal today is to wean down supplemental oxygen.  Acute exacerbation of COPD Concern about PE given recent covid infection and persistently high D dimer CTA to rule negative for PE but reports concern for atypical/noninfectious pneumonia vs alveolar tumor.  I will consult pulmonologist to see on 12/10/21.    Continue duo nebs, Mucinex, Solu-Medrol, Singulair azithromycin. Continue Protonix to prevent steroid-induced ulcer Continue incentive spirometry and flutter valve Wean oxygen to maintain O2 sat >87% Follow up TTE Multiple recent hospitalizations and remains full code, will ask for palliative consult for goals of care discussions   Multifocal pneumonia - appears to be resolving on CXR.  Chest CT with concern for noninfectious/atypical pneumonia versus alveolar tumor.  Will consult pulmonology on 10/16.  Procalcitonin <0.10 which is reassuring Patient was started on ceftriaxone and azithromycin Follow up blood culture, sputum culture, urine Legionella, strep pneumo and procalcitonin Continue Tylenol as needed Continue Mucinex, incentive spirometry, flutter valve   Bilateral  DVT Confirmed by venous US 10/14 TED  [facility] Apixaban ordered dosed per pharm D  PE was ruled out by CTA    Lactic acidosis from hypoxia Lactic acid 2.8 > 2.3 trending down   Hypoalbuminemia probably secondary to mild protein calorie malnutrition Albumin 3.3, protein supplement will be provided   Dysphagia Last EGD on 04/2016 by Dr. Oneida Alar  showed no endoscopic esophageal abnormality to explain patient's dysphagia. Esophagus dilated per medical record Due to concern for aspiration, swallow eval will be done in the morning   T2DM with steroid induced hyperglycemia  Hemoglobin A1c on 10/8 was 6.1 Continue ISS and hypoglycemia protocol Semglee 30 units plus novolog 10 units TID with meals, plus supplemental coverage and frequent CBG monitoring   Iron deficiency anemia Continue ferrous sulfate   Essential hypertension Continue losartan, Lopressor, add hydralazine    Mixed hyperlipidemia Continue Lipitor   GERD Continue Protonix   Peripheral neuropathy Continue Neurontin    DVT prophylaxis: enoxaparin  Code Status: Full  Family Communication: son at bedside 10/14>> Disposition: Status is: Inpatient Remains inpatient appropriate because: IV antibiotics, IV steroids, high oxygen requirements   Consultants:  Pulmonary for 10/16 Procedures:   Antimicrobials:  Ceftriaxone / azithromycin 10/13>>  Subjective: Pt reports less SOB but remains with chest congestion.      Objective: Vitals:   12/09/21 0630 12/09/21 0700 12/09/21 0750 12/09/21 0800  BP:  (!) 177/93  (!) 198/56  Pulse: (!) 59 (!) 59  72  Resp: '12 13  20  '$ Temp:   98 F (36.7 C)   TempSrc:   Oral   SpO2: 96% 98%  99%  Weight:      Height:        Intake/Output Summary (Last 24 hours) at 12/09/2021 0920 Last data filed at 12/09/2021 0800 Gross per 24 hour  Intake 1508.18 ml  Output 1500 ml  Net 8.18 ml   Filed Weights   12/07/21 1427 12/07/21 1900 12/09/21 0553  Weight: 80.7 kg 81.1 kg 80.7 kg    Examination:  General exam: chronically ill appearing, awake, alert, eating breakfast, Appears calm and comfortable  Respiratory system:  coarse sounds heard, rales heard bilaterally. Exp wheezing heard bilaterally. Cardiovascular system: normal S1 & S2 heard. No JVD, murmurs, rubs, gallops or clicks. No pedal edema. Gastrointestinal system: Abdomen is nondistended, soft and nontender. No organomegaly or masses felt. Normal bowel sounds heard. Central nervous system: Alert and oriented. No focal neurological deficits. Extremities: Symmetric 5 x 5 power. Skin: No rashes, lesions or ulcers. Psychiatry: Judgement and insight appear normal. Mood & affect appropriate.   Data Reviewed: I have personally reviewed following labs and imaging studies  CBC: Recent Labs  Lab 12/04/21 0430 12/07/21 1458 12/08/21 0344 12/09/21 0343  WBC 11.4* 15.3* 9.4 12.1*  NEUTROABS  --  10.6*  --   --   HGB 8.5* 9.8* 8.6* 8.5*  HCT 28.9* 32.8* 29.0* 28.8*  MCV 80.3 82.2 81.2 82.1  PLT 266 208 182 564    Basic Metabolic Panel: Recent Labs  Lab 12/04/21 0430 12/07/21 1458 12/08/21 0344 12/09/21 0343  NA 139 139 138 138  K 4.1 3.7 4.5 4.9  CL  106 107 106 104  CO2 26 21* 25 28  GLUCOSE 139* 88 292* 297*  BUN '9 11 12 15  '$ CREATININE 0.65 0.77 0.68 0.70  CALCIUM 10.3 10.5* 10.1 10.1  MG 1.6*  --  1.5* 1.8  PHOS  --   --  3.9  --     CBG: Recent Labs  Lab 12/08/21 1144 12/08/21 1626 12/08/21 2207 12/09/21 0302 12/09/21 0733  GLUCAP 258* 130* 175* 304* 214*    Recent Results (from the past 240 hour(s))  Resp Panel by RT-PCR (Flu A&B, Covid) Anterior Nasal Swab     Status: None   Collection Time: 12/07/21  2:58 PM   Specimen: Anterior Nasal Swab  Result Value Ref Range Status   SARS Coronavirus 2 by RT PCR NEGATIVE NEGATIVE Final    Comment: (NOTE) SARS-CoV-2 target nucleic acids are NOT DETECTED.  The SARS-CoV-2 RNA is generally detectable in upper respiratory specimens during the  acute phase of infection. The lowest concentration of SARS-CoV-2 viral copies this assay can detect is 138 copies/mL. A negative result does not preclude SARS-Cov-2 infection and should not be used as the sole basis for treatment or other patient management decisions. A negative result may occur with  improper specimen collection/handling, submission of specimen other than nasopharyngeal swab, presence of viral mutation(s) within the areas targeted by this assay, and inadequate number of viral copies(<138 copies/mL). A negative result must be combined with clinical observations, patient history, and epidemiological information. The expected result is Negative.  Fact Sheet for Patients:  EntrepreneurPulse.com.au  Fact Sheet for Healthcare Providers:  IncredibleEmployment.be  This test is no t yet approved or cleared by the [place] FDA and  has been authorized for detection and/or diagnosis of SARS-CoV-2 by FDA under an Emergency Use Authorization (EUA). This EUA will remain  in effect (meaning this test can be used) for the duration of the COVID-19 declaration under Section 564(b)(1) of the Act, 21 U.S.C.section 360bbb-3(b)(1), unless the authorization is terminated  or revoked sooner.       Influenza A by PCR NEGATIVE NEGATIVE Final   Influenza B by PCR NEGATIVE NEGATIVE Final    Comment: (NOTE) The Xpert Xpress SARS-CoV-2/FLU/RSV plus assay is intended as an aid in the diagnosis of influenza from Nasopharyngeal swab specimens and should not be used as a sole basis for treatment. Nasal washings and aspirates are unacceptable for Xpert Xpress SARS-CoV-2/FLU/RSV testing.  Fact Sheet for Patients: EntrepreneurPulse.com.au  Fact Sheet for Healthcare Providers: IncredibleEmployment.be  This test is not yet approved or cleared by the [place] FDA and has been authorized for detection and/or  diagnosis of SARS-CoV-2 by FDA under an Emergency Use Authorization (EUA). This EUA will remain in effect (meaning this test can be used) for the duration of the COVID-19 declaration under Section 564(b)(1) of the Act, 21 U.S.C. section 360bbb-3(b)(1), unless the authorization is terminated or revoked.  Performed at [facility], [address] [place], [place] [address]   Blood Culture (routine x 2)     Status: None (Preliminary result)   Collection Time: 12/07/21  2:58 PM   Specimen: BLOOD RIGHT FOREARM  Result Value Ref Range Status   Specimen Description BLOOD RIGHT FOREARM  Final   Special Requests   Final    BOTTLES DRAWN AEROBIC AND ANAEROBIC Blood Culture adequate volume   Culture   Final    NO GROWTH < 24 HOURS Performed at [facility], [address] [place], [place] [address]  Report Status PENDING  Incomplete  Blood Culture (routine x 2)     Status: None (Preliminary result)   Collection Time: 12/07/21  3:03 PM   Specimen: Right Antecubital; Blood  Result Value Ref Range Status   Specimen Description RIGHT ANTECUBITAL  Final   Special Requests   Final    BOTTLES DRAWN AEROBIC AND ANAEROBIC Blood Culture results may not be optimal due to an inadequate volume of blood received in culture bottles   Culture   Final    NO GROWTH < 24 HOURS Performed at [facility], [address], [place], [place] [address]    Report Status PENDING  Incomplete     Radiology Studies: DG CHEST PORT 1 VIEW  Result Date: 12/08/2021 CLINICAL DATA:  Respiratory failure EXAM: PORTABLE CHEST 1 VIEW COMPARISON:  12/07/2021 FINDINGS: No significant change in AP portable chest radiograph. Cardiomegaly with bilateral interstitial and heterogeneous airspace opacity of the lung bases, and probable small layering pleural effusions. Partially imaged chronic fracture deformity of the proximal left humerus. IMPRESSION: No significant change in AP portable chest radiograph. Cardiomegaly with bilateral  interstitial and heterogeneous airspace opacity of the lung bases, and probable small layering pleural effusions. Electronically Signed   By: Delanna Ahmadi M.D.   On: 12/08/2021 16:16   ECHOCARDIOGRAM COMPLETE  Result Date: 12/08/2021    ECHOCARDIOGRAM REPORT   Patient Name:   CERENITI CURB Date of Exam: 12/08/2021 Medical Rec #:  [id number]       Height:       65.0 in Accession #:    [id number]      Weight:       178.8 lb Date of Birth:  Jul 01, 1935        BSA:          1.886 m Patient Age:    46 years        BP:           160/42 mmHg Patient Gender: F               HR:           68 bpm. Exam Location:  [facility] Procedure: 2D Echo, Cardiac Doppler and Color Doppler Indications:    CHF  History:        Patient has no prior history of Echocardiogram examinations.                 COPD; Risk Factors:Hypertension, Diabetes and Dyslipidemia.  Sonographer:    Wenda Low Referring Phys: [id number] OLADAPO ADEFESO IMPRESSIONS  1. Left ventricular ejection fraction, by estimation, is 60 to 65%. The left ventricle has normal function. The left ventricle has no regional wall motion abnormalities. The left ventricular internal cavity size was mildly dilated. There is mild left ventricular hypertrophy of the septal segment. Left ventricular diastolic parameters are consistent with Grade I diastolic dysfunction (impaired relaxation).  2. Right ventricular systolic function is normal. The right ventricular size is normal. There is normal pulmonary artery systolic pressure.  3. The mitral valve is grossly normal. No evidence of mitral valve regurgitation.  4. The aortic valve is tricuspid. Aortic valve regurgitation is not visualized. No aortic stenosis is present.  5. The inferior vena cava is normal in size with greater than 50% respiratory variability, suggesting right atrial pressure of 3 mmHg. Comparison(s): No prior Echocardiogram. FINDINGS  Left Ventricle: Left ventricular ejection fraction, by estimation, is 60 to  65%. The left ventricle has normal function. The left ventricle  has no regional wall motion abnormalities. The left ventricular internal cavity size was mildly dilated. There is  mild left ventricular hypertrophy of the septal segment. Left ventricular diastolic parameters are consistent with Grade I diastolic dysfunction (impaired relaxation). Right Ventricle: The right ventricular size is normal. No increase in right ventricular wall thickness. Right ventricular systolic function is normal. There is normal pulmonary artery systolic pressure. The tricuspid regurgitant velocity is 2.06 m/s, and  with an assumed right atrial pressure of 3 mmHg, the estimated right ventricular systolic pressure is 18.5 mmHg. Left Atrium: Left atrial size was normal in size. Right Atrium: Right atrial size was normal in size. Pericardium: There is no evidence of pericardial effusion. Mitral Valve: The mitral valve is grossly normal. Mild mitral annular calcification. No evidence of mitral valve regurgitation. MV peak gradient, 4.9 mmHg. The mean mitral valve gradient is 2.0 mmHg. Tricuspid Valve: The tricuspid valve is normal in structure. Tricuspid valve regurgitation is not demonstrated. No evidence of tricuspid stenosis. Aortic Valve: The aortic valve is tricuspid. There is mild aortic valve annular calcification. Aortic valve regurgitation is not visualized. No aortic stenosis is present. Aortic valve mean gradient measures 6.0 mmHg. Aortic valve peak gradient measures 12.1 mmHg. Aortic valve area, by VTI measures 2.03 cm. Pulmonic Valve: The pulmonic valve was normal in structure. Pulmonic valve regurgitation is not visualized. No evidence of pulmonic stenosis. Aorta: The aortic root is normal in size and structure. Venous: The inferior vena cava is normal in size with greater than 50% respiratory variability, suggesting right atrial pressure of 3 mmHg. IAS/Shunts: No atrial level shunt detected by color flow Doppler.  LEFT  VENTRICLE PLAX 2D LVIDd:         5.30 cm   Diastology LVIDs:         3.20 cm   LV e' medial:    10.00 cm/s LV PW:         1.20 cm   LV E/e' medial:  7.4 LV IVS:        1.00 cm   LV e' lateral:   8.05 cm/s LVOT diam:     1.90 cm   LV E/e' lateral: 9.2 LV SV:         77 LV SV Index:   41 LVOT Area:     2.84 cm  RIGHT VENTRICLE RV Basal diam:  3.30 cm RV Mid diam:    2.40 cm RV S prime:     15.70 cm/s TAPSE (M-mode): 3.0 cm LEFT ATRIUM             Index        RIGHT ATRIUM           Index LA diam:        4.00 cm 2.12 cm/m   RA Area:     14.80 cm LA Vol (A2C):   52.9 ml 28.05 ml/m  RA Volume:   38.50 ml  20.41 ml/m LA Vol (A4C):   54.0 ml 28.63 ml/m LA Biplane Vol: 55.5 ml 29.43 ml/m  AORTIC VALVE                     PULMONIC VALVE AV Area (Vmax):    1.66 cm      PV Vmax:       1.05 m/s AV Area (Vmean):   1.69 cm      PV Peak grad:  4.4 mmHg AV Area (VTI):     2.03 cm AV Vmax:  174.00 cm/s AV Vmean:          118.000 cm/s AV VTI:            0.380 m AV Peak Grad:      12.1 mmHg AV Mean Grad:      6.0 mmHg LVOT Vmax:         102.00 cm/s LVOT Vmean:        70.400 cm/s LVOT VTI:          0.272 m LVOT/AV VTI ratio: 0.72  AORTA Ao Root diam: 3.20 cm MITRAL VALVE                TRICUSPID VALVE MV Area (PHT): 2.92 cm     TR Peak grad:   17.0 mmHg MV Area VTI:   2.38 cm     TR Vmax:        206.00 cm/s MV Peak grad:  4.9 mmHg MV Mean grad:  2.0 mmHg     SHUNTS MV Vmax:       1.11 m/s     Systemic VTI:  0.27 m MV Vmean:      63.0 cm/s    Systemic Diam: 1.90 cm MV Decel Time: 260 msec MV E velocity: 73.70 cm/s MV A velocity: 104.00 cm/s MV E/A ratio:  0.71 Rudean Haskell MD Electronically signed by Rudean Haskell MD Signature Date/Time: 12/08/2021/2:14:12 PM    Final    CT Angio Chest Pulmonary Embolism (PE) W or WO Contrast  Result Date: 12/08/2021 CLINICAL DATA:  Positive D-dimer.  DVT EXAM: CT ANGIOGRAPHY CHEST WITH CONTRAST TECHNIQUE: Multidetector CT imaging of the chest was performed using  the standard protocol during bolus administration of intravenous contrast. Multiplanar CT image reconstructions and MIPs were obtained to evaluate the vascular anatomy. RADIATION DOSE REDUCTION: This exam was performed according to the departmental dose-optimization program which includes automated exposure control, adjustment of the mA and/or kV according to patient size and/or use of iterative reconstruction technique. CONTRAST:  66m OMNIPAQUE IOHEXOL 350 MG/ML SOLN COMPARISON:  11/28/2021 FINDINGS: Cardiovascular: Satisfactory opacification of the pulmonary arteries to the segmental level. No evidence of pulmonary embolism. Normal heart size. No pericardial effusion. Extensive atheromatous calcification of the aorta and coronaries Mediastinum/Nodes: Negative for adenopathy. Lungs/Pleura: Unchanged pattern of multifocal consolidation affecting all lobes with ground-glass, nodular, and consolidative opacities. Lower lobe airspace opacity is new/progressed from July 2023 but remaining opacity is very similar. Central airways are clear. Mild emphysematous change. In an otherwise clear area of lung there is an 8 mm irregular pulmonary nodule which is known from prior CTs. Upper Abdomen: Atheromatous plaque. Dystrophic type calcification in the right posterior liver. Granulomatous calcifications in the spleen. Musculoskeletal: No acute finding. Generalized thoracic spine degeneration. Review of the MIP images confirms the above findings. IMPRESSION: 1. Negative for pulmonary embolism. 2. Multi lobar airspace disease much of which is chronic when compared to a July 2023 CT. Lingular opacity is definitely chronic when compared to a January 2023 CT, and progressive. Consider atypical and non infectious pneumonias. Consider alveolar tumor. 3. Known spiculated nodule in the right upper lobe. Electronically Signed   By: JJorje GuildM.D.   On: 12/08/2021 12:02   [place] Img Lower Bilateral (DVT)  Result Date:  12/08/2021 CLINICAL DATA:  Bilateral lower extremity edema. EXAM: BILATERAL LOWER EXTREMITY VENOUS DOPPLER ULTRASOUND TECHNIQUE: Gray-scale sonography with graded compression, as well as color Doppler and duplex ultrasound were performed to evaluate the lower extremity deep venous systems from the  level of the common femoral vein and including the common femoral, femoral, profunda femoral, popliteal and calf veins including the posterior tibial, peroneal and gastrocnemius veins when visible. The superficial great saphenous vein was also interrogated. Spectral Doppler was utilized to evaluate flow at rest and with distal augmentation maneuvers in the common femoral, femoral and popliteal veins. COMPARISON:  None Available. FINDINGS: RIGHT LOWER EXTREMITY Common Femoral Vein: No evidence of thrombus. Normal compressibility, respiratory phasicity and response to augmentation. Saphenofemoral Junction: No evidence of thrombus. Normal compressibility and flow on color Doppler imaging. Profunda Femoral Vein: No evidence of thrombus. Normal compressibility and flow on color Doppler imaging. Femoral Vein: No evidence of thrombus. Normal compressibility, respiratory phasicity and response to augmentation. Popliteal Vein: No evidence of thrombus. Normal compressibility, respiratory phasicity and response to augmentation. Calf Veins: Normal patency of posterior tibial vein. There is some visualized thrombus in the right peroneal vein which appears occlusive in the proximal calf. Gastrocnemius thrombus identified in the right calf. Superficial Great Saphenous Vein: No evidence of thrombus. Normal compressibility. Venous Reflux:  None. Other Findings: No evidence of superficial thrombophlebitis or abnormal fluid collection. LEFT LOWER EXTREMITY Common Femoral Vein: No evidence of thrombus. Normal compressibility, respiratory phasicity and response to augmentation. Saphenofemoral Junction: No evidence of thrombus. Normal  compressibility and flow on color Doppler imaging. Profunda Femoral Vein: No evidence of thrombus. Normal compressibility and flow on color Doppler imaging. Femoral Vein: No evidence of thrombus. Normal compressibility, respiratory phasicity and response to augmentation. Popliteal Vein: No evidence of thrombus. Normal compressibility, respiratory phasicity and response to augmentation. Calf Veins: Posterior tibial vein is normally patent. Left peroneal vein DVT visualized. Superficial Great Saphenous Vein: No evidence of thrombus. Normal compressibility. Venous Reflux:  None. Other Findings: No evidence of superficial thrombophlebitis or abnormal fluid collection. IMPRESSION: Positive for bilateral calf DVT in the peroneal veins and right gastrocnemius vein. Electronically Signed   By: Aletta Edouard M.D.   On: 12/08/2021 11:50   DG Chest Port 1 View  Result Date: 12/07/2021 CLINICAL DATA:  Questionable sepsis. Evaluate for abnormality. Hypoxemia. History of asthma/COPD. EXAM: PORTABLE CHEST 1 VIEW COMPARISON:  Radiographs 11/29/2021 and 11/28/2021.  CT 11/28/2021. FINDINGS: 1509 hours. The heart size and mediastinal contours appear stable. Compared with the most recent studies, there is partial clearing of the bibasilar airspace opacities, most consistent with improving pneumonia. No pneumothorax or significant pleural effusion identified. No acute osseous findings are seen. Telemetry leads overlie the chest. There is multilevel thoracic spondylosis. IMPRESSION: Interval partial clearing of bibasilar airspace opacities, most consistent with improving pneumonia. Continued follow-up recommended to document complete clearing. No new findings identified. Electronically Signed   By: Richardean Sale M.D.   On: 12/07/2021 15:27    Scheduled Meds:  apixaban  10 mg Oral BID   Followed by   Derrill Memo ON 12/15/2021] apixaban  5 mg Oral BID   aspirin  81 mg Oral Daily   atorvastatin  80 mg Oral Daily   Chlorhexidine  Gluconate Cloth  6 each Topical Q0600   docusate sodium  100 mg Oral Daily   feeding supplement (GLUCERNA SHAKE)  237 mL Oral TID BM   ferrous sulfate  325 mg Oral Daily   gabapentin  100 mg Oral TID   guaiFENesin  600 mg Oral BID   hydrALAZINE  25 mg Oral Q8H   insulin aspart  0-15 Units Subcutaneous TID WC   insulin aspart  14 Units Subcutaneous TID WC   insulin glargine-yfgn  40  Units Subcutaneous Daily   ipratropium-albuterol  3 mL Nebulization Q6H   losartan  100 mg Oral Daily   methylPREDNISolone (SOLU-MEDROL) injection  40 mg Intravenous Q12H   metoprolol tartrate  50 mg Oral BID   mometasone-formoterol  2 puff Inhalation BID   montelukast  10 mg Oral QHS   pantoprazole  40 mg Oral Daily   polyvinyl alcohol  1 drop Both Eyes QID   umeclidinium bromide  1 puff Inhalation Daily   Continuous Infusions:  azithromycin Stopped (12/08/21 1704)   cefTRIAXone (ROCEPHIN)  IV Stopped (12/08/21 1520)   lactated ringers 30 mL/hr at 12/09/21 0643   magnesium sulfate bolus IVPB       LOS: 2 days   Critical Care Procedure Note Authorized and Performed by: Murvin Natal MD  Total Critical Care time:  37 mins Due to a high probability of clinically significant, life threatening deterioration, the patient required my highest level of preparedness to intervene emergently and I personally spent this critical care time directly and personally managing the patient.  This critical care time included obtaining a history; examining the patient, pulse oximetry; ordering and review of studies; arranging urgent treatment with development of a management plan; evaluation of patient's response of treatment; frequent reassessment; and discussions with other providers.  This critical care time was performed to assess and manage the high probability of imminent and life threatening deterioration that could result in multi-organ failure.  It was exclusive of separately billable procedures and treating other  patients and teaching time.    Irwin Brakeman, MD How to contact the [facility] Attending or Consulting provider [place] or covering provider during after hours [place], for this patient?  Check the care team in [facility] and look for a) attending/consulting TRH provider listed and b) the [facility] team listed Log into [email or web address] and use [place]'s universal password to access. If you do not have the password, please contact the hospital operator. Locate the [facility] provider you are looking for under [facility] and page to a number that you can be directly reached. If you still have difficulty reaching the provider, please page the [facility] (Director on Call) for the Hospitalists listed on amion for assistance.  12/09/2021, 9:20 AM

## 2021-12-09 NOTE — Progress Notes (Signed)
Pt placed on high flow salter nasal cannula 10 liters.  RT will continue to assess and wean throughout the shift.

## 2021-12-10 DIAGNOSIS — Z515 Encounter for palliative care: Secondary | ICD-10-CM

## 2021-12-10 DIAGNOSIS — R1319 Other dysphagia: Secondary | ICD-10-CM

## 2021-12-10 DIAGNOSIS — J9601 Acute respiratory failure with hypoxia: Secondary | ICD-10-CM

## 2021-12-10 DIAGNOSIS — R7989 Other specified abnormal findings of blood chemistry: Secondary | ICD-10-CM | POA: Diagnosis not present

## 2021-12-10 DIAGNOSIS — Z7189 Other specified counseling: Secondary | ICD-10-CM | POA: Diagnosis not present

## 2021-12-10 DIAGNOSIS — J189 Pneumonia, unspecified organism: Secondary | ICD-10-CM | POA: Diagnosis not present

## 2021-12-10 DIAGNOSIS — J441 Chronic obstructive pulmonary disease with (acute) exacerbation: Secondary | ICD-10-CM | POA: Diagnosis not present

## 2021-12-10 DIAGNOSIS — J9621 Acute and chronic respiratory failure with hypoxia: Secondary | ICD-10-CM | POA: Diagnosis not present

## 2021-12-10 LAB — GLUCOSE, CAPILLARY
Glucose-Capillary: 199 mg/dL — ABNORMAL HIGH (ref 70–99)
Glucose-Capillary: 192 mg/dL — ABNORMAL HIGH (ref 70–99)
Glucose-Capillary: 156 mg/dL — ABNORMAL HIGH (ref 70–99)
Glucose-Capillary: 237 mg/dL — ABNORMAL HIGH (ref 70–99)
Glucose-Capillary: 135 mg/dL — ABNORMAL HIGH (ref 70–99)
Glucose-Capillary: 168 mg/dL — ABNORMAL HIGH (ref 70–99)

## 2021-12-10 LAB — BASIC METABOLIC PANEL
Anion gap: 6 (ref 5–15)
BUN: 16 mg/dL (ref 8–23)
CO2: 27 mmol/L (ref 22–32)
Calcium: 10.1 mg/dL (ref 8.9–10.3)
Chloride: 104 mmol/L (ref 98–111)
Creatinine, Ser: 0.6 mg/dL (ref 0.44–1.00)
GFR, Estimated: >60 mL/min (ref >60)
Glucose, Bld: 196 mg/dL — ABNORMAL HIGH (ref 70–99)
Potassium: 4.8 mmol/L (ref 3.5–5.1)
Sodium: 137 mmol/L (ref 135–145)

## 2021-12-10 LAB — CBC
HCT: 29.3 % — ABNORMAL LOW (ref 36.0–46.0)
Hemoglobin: 8.7 g/dL — ABNORMAL LOW (ref 12.0–15.0)
MCH: 24.2 pg — ABNORMAL LOW (ref 26.0–34.0)
MCHC: 29.7 g/dL — ABNORMAL LOW (ref 30.0–36.0)
MCV: 81.4 fL (ref 80.0–100.0)
Platelets: 166 10*3/uL (ref 150–400)
RBC: 3.6 MIL/uL — ABNORMAL LOW (ref 3.87–5.11)
RDW: 26.7 % — ABNORMAL HIGH (ref 11.5–15.5)
WBC: 11.4 10*3/uL — ABNORMAL HIGH (ref 4.0–10.5)
nRBC: 0 % (ref 0.0–0.2)

## 2021-12-10 LAB — MAGNESIUM: Magnesium: 2 mg/dL (ref 1.7–2.4)

## 2021-12-10 MED ORDER — METHYLPREDNISOLONE SODIUM SUCC 125 MG IJ SOLR
80.0000 mg | Freq: Three times a day (TID) | INTRAMUSCULAR | Status: DC
  Administered 2021-12-10 – 2021-12-13 (×9): 80 mg via INTRAVENOUS
  Filled 2021-12-10 (×9): qty 2

## 2021-12-10 MED ORDER — VANCOMYCIN HCL 1500 MG/300ML IV SOLN
1500.0000 mg | Freq: Once | INTRAVENOUS | Status: AC
  Administered 2021-12-10: 1500 mg via INTRAVENOUS
  Filled 2021-12-10: qty 300

## 2021-12-10 MED ORDER — VANCOMYCIN HCL IN DEXTROSE 1-5 GM/200ML-% IV SOLN: 1000.0000 mg | INTRAVENOUS | Status: DC

## 2021-12-10 NOTE — Progress Notes (Signed)
PROGRESS NOTE   Michaela Moon  CVE:[id number] DOB: Mar 18, 1935 DOA: 12/07/2021 PCP: [name], MD   Chief Complaint  Patient presents with   Shortness of Breath   Level of care: Stepdown  Brief Admission History:  86 y.o. female with medical history significant of hypertension, T2DM, COPD, dysphagia requiring dilation who presents to the emergency department due to shortness of breath. She was recently admitted from 10/4 to 10/10 due to acute on chronic hypoxemic respiratory failure secondary to multifocal pneumonia which was treated with IV Rocephin and azithromycin.  She completed a course of antibiotics while still in the hospital and O2 sat was weaned down to 4 to 5 L via [place].  It appeared that patient was on supplemental oxygen at 2 LPM on returning home after discharge.  Home oxygen was being monitored at home and when the home health nurse went to see patient yesterday, she was slightly hypoxic, home oxygen was monitored within the past 24 hours and was noted to have dropped down into the low 80s and the oxygen was increased to 5 L/min via [place], despite this, patient was still hypoxic with an O2 sat of 82% on arrival to the ED Patient was also recently admitted to [facility] from 9/2 to 9/12 due to multifocal pneumonia and acute respiratory failure with hypoxia due to COVID-19 during which she was transferred to ICU and she required BiPAP, she was treated with Paxlovid for 5 days and she continued Decadron and empiric antibiotic.  She was discharged with supplemental oxygen via [place] at 2 LPM   At bedside, patient was requiring supplemental oxygen via HFNC at 15 L, she was not in any acute distress.  There was concern regarding food getting stuck in her throat/chest.   ED Course:  In the emergency department, she was intermittently tachypneic, BP was 131/57, temperature 97.87F, pulse 100 bpm, O2 sat 95% on NRB.  Work-up in the ED showed WBC 15.3, hemoglobin 8.8, hematocrit 32.8, MCV  82.2, platelets 208.  BMP showed sodium 139, potassium 3.7, chloride 107, bicarb 24, glucose 88, BUN 11, creatinine 0.77.  Lactic acid 2.8 > 2.3, BNP 116 (this was 329 about 9 days ago).  Influenza A, B, SARS coronavirus 2 was negative.  Blood culture was pending.  Chest x-ray showed Interval partial clearing of bibasilar airspace opacities, most consistent with improving pneumonia. Solu-Medrol 25 mg x 1 was given, she was started on IV ceftriaxone and azithromycin, IV hydration was provided.  Hospitalist was asked to admit patient for further evaluation and management.   Assessment and Plan:  Acute on chronic respiratory failure with hypoxia O2 sat dropped to 80s on supplemental oxygen via [place] at 5 LPM and required up to [facility] at 30 L/min to maintain sats Goal pulse ox is >87% only  no need for pulse ox to be 100%  Goal today is to wean down supplemental oxygen.  Acute exacerbation of COPD Concern about PE given recent covid infection and persistently high D dimer CTA to rule negative for PE but reports concern for atypical/noninfectious pneumonia vs alveolar tumor.  I will consult pulmonologist to see on 12/10/21.    Continue duo nebs, Mucinex, Solu-Medrol, Singulair azithromycin. Continue Protonix to prevent steroid-induced ulcer Continue incentive spirometry and flutter valve Wean oxygen to maintain O2 sat >87% Follow up TTE Multiple recent hospitalizations and remains full code, will ask for palliative consult for goals of care discussions   Multifocal pneumonia - appears to be resolving on CXR.  Chest CT with concern for noninfectious/atypical pneumonia versus alveolar tumor.  Will consult pulmonology on 10/16.  Procalcitonin <0.10 which is reassuring Patient was started on ceftriaxone and azithromycin Follow up blood culture, sputum culture, urine Legionella, strep pneumo and procalcitonin Continue Tylenol as needed Continue Mucinex, incentive spirometry, flutter valve  Pulmonary  consultation 12/10/21   Bilateral DVT Confirmed by venous US 10/14 TED  Hoses Apixaban ordered dosed per pharm D  PE was ruled out by CTA    Lactic acidosis from hypoxia Lactic acid 2.8 > 2.3 trending down   Hypoalbuminemia probably secondary to mild protein calorie malnutrition Albumin 3.3, protein supplement will be provided   Dysphagia Last EGD on 04/2016 by Dr. Oneida Alar  showed no endoscopic esophageal abnormality to explain patient's dysphagia. Esophagus dilated per medical record Due to concern for aspiration, swallow eval will be done in the morning   T2DM with steroid induced hyperglycemia  Hemoglobin A1c on 10/8 was 6.1 Continue ISS and hypoglycemia protocol Semglee 40 units plus novolog 14 units TID with meals, plus supplemental coverage and frequent CBG monitoring  CBG (last 3)  Recent Labs    12/10/21 0710 12/10/21 1136 12/10/21 1555  GLUCAP 156* 237* 135*    Iron deficiency anemia Continue ferrous sulfate   Essential hypertension Continue losartan, Lopressor, add hydralazine    Mixed hyperlipidemia Continue Lipitor   GERD Continue Protonix   Peripheral neuropathy Continue Neurontin    DVT prophylaxis: enoxaparin  Code Status: Full  Family Communication: son at bedside 10/14>> Disposition: Status is: Inpatient Remains inpatient appropriate because: IV antibiotics, IV steroids, high oxygen requirements   Consultants:  Pulmonary for 10/16 Procedures:   Antimicrobials:  Ceftriaxone / azithromycin 10/13>>  Vancomycin 10/16>> Subjective: Pt continues with cough and chest congestion.      Objective: Vitals:   12/10/21 1326 12/10/21 1400 12/10/21 1500 12/10/21 1557  BP: (!) 162/46  (!) 157/57   Pulse:  81 80   Resp:  17 16   Temp:    97.8 F (36.6 C)  TempSrc:    Oral  SpO2:  (!) 88% (!) 89%   Weight:      Height:        Intake/Output Summary (Last 24 hours) at 12/10/2021 1620 Last data filed at 12/10/2021 0944 Gross per 24 hour  Intake  392.05 ml  Output 1675 ml  Net -1282.95 ml   Filed Weights   12/07/21 1900 12/09/21 0553 12/10/21 0434  Weight: 81.1 kg 80.7 kg 80.7 kg   Examination:  General exam: chronically ill appearing, awake, alert, eating breakfast, Appears calm and comfortable  Respiratory system:  coarse sounds heard, rales heard bilaterally. Exp wheezing heard bilaterally. Cardiovascular system: normal S1 & S2 heard. No JVD, murmurs, rubs, gallops or clicks. No pedal edema. Gastrointestinal system: Abdomen is nondistended, soft and nontender. No organomegaly or masses felt. Normal bowel sounds heard. Central nervous system: Alert and oriented. No focal neurological deficits. Extremities: Symmetric 5 x 5 power. Skin: No rashes, lesions or ulcers. Psychiatry: Judgement and insight appear normal. Mood & affect appropriate.   Data Reviewed: I have personally reviewed following labs and imaging studies  CBC: Recent Labs  Lab 12/04/21 0430 12/07/21 1458 12/08/21 0344 12/09/21 0343 12/10/21 0418  WBC 11.4* 15.3* 9.4 12.1* 11.4*  NEUTROABS  --  10.6*  --   --   --   HGB 8.5* 9.8* 8.6* 8.5* 8.7*  HCT 28.9* 32.8* 29.0* 28.8* 29.3*  MCV 80.3 82.2 81.2 82.1 81.4  PLT 266  208 182 170 564    Basic Metabolic Panel: Recent Labs  Lab 12/04/21 0430 12/07/21 1458 12/08/21 0344 12/09/21 0343 12/10/21 0418  NA 139 139 138 138 137  K 4.1 3.7 4.5 4.9 4.8  CL 106 107 106 104 104  CO2 26 21* '25 28 27  '$ GLUCOSE 139* 88 292* 297* 196*  BUN '9 11 12 15 16  '$ CREATININE 0.65 0.77 0.68 0.70 0.60  CALCIUM 10.3 10.5* 10.1 10.1 10.1  MG 1.6*  --  1.5* 1.8 2.0  PHOS  --   --  3.9  --   --     CBG: Recent Labs  Lab 12/10/21 0251 12/10/21 0433 12/10/21 0710 12/10/21 1136 12/10/21 1555  GLUCAP 199* 192* 156* 237* 135*    Recent Results (from the past 240 hour(s))  Resp Panel by RT-PCR (Flu A&B, Covid) Anterior Nasal Swab     Status: None   Collection Time: 12/07/21  2:58 PM   Specimen: Anterior Nasal Swab   Result Value Ref Range Status   SARS Coronavirus 2 by RT PCR NEGATIVE NEGATIVE Final    Comment: (NOTE) SARS-CoV-2 target nucleic acids are NOT DETECTED.  The SARS-CoV-2 RNA is generally detectable in upper respiratory specimens during the acute phase of infection. The lowest concentration of SARS-CoV-2 viral copies this assay can detect is 138 copies/mL. A negative result does not preclude SARS-Cov-2 infection and should not be used as the sole basis for treatment or other patient management decisions. A negative result may occur with  improper specimen collection/handling, submission of specimen other than nasopharyngeal swab, presence of viral mutation(s) within the areas targeted by this assay, and inadequate number of viral copies(<138 copies/mL). A negative result must be combined with clinical observations, patient history, and epidemiological information. The expected result is Negative.  Fact Sheet for Patients:  EntrepreneurPulse.com.au  Fact Sheet for Healthcare Providers:  IncredibleEmployment.be  This test is no t yet approved or cleared by the [place] FDA and  has been authorized for detection and/or diagnosis of SARS-CoV-2 by FDA under an Emergency Use Authorization (EUA). This EUA will remain  in effect (meaning this test can be used) for the duration of the COVID-19 declaration under Section 564(b)(1) of the Act, 21 U.S.C.section 360bbb-3(b)(1), unless the authorization is terminated  or revoked sooner.       Influenza A by PCR NEGATIVE NEGATIVE Final   Influenza B by PCR NEGATIVE NEGATIVE Final    Comment: (NOTE) The Xpert Xpress SARS-CoV-2/FLU/RSV plus assay is intended as an aid in the diagnosis of influenza from Nasopharyngeal swab specimens and should not be used as a sole basis for treatment. Nasal washings and aspirates are unacceptable for Xpert Xpress SARS-CoV-2/FLU/RSV testing.  Fact Sheet for  Patients: EntrepreneurPulse.com.au  Fact Sheet for Healthcare Providers: IncredibleEmployment.be  This test is not yet approved or cleared by the [place] FDA and has been authorized for detection and/or diagnosis of SARS-CoV-2 by FDA under an Emergency Use Authorization (EUA). This EUA will remain in effect (meaning this test can be used) for the duration of the COVID-19 declaration under Section 564(b)(1) of the Act, 21 U.S.C. section 360bbb-3(b)(1), unless the authorization is terminated or revoked.  Performed at [facility], [address], [place], [place] [address]   Blood Culture (routine x 2)     Status: None (Preliminary result)   Collection Time: 12/07/21  2:58 PM   Specimen: BLOOD RIGHT FOREARM  Result Value Ref Range Status   Specimen Description BLOOD RIGHT  FOREARM  Final   Special Requests   Final    BOTTLES DRAWN AEROBIC AND ANAEROBIC Blood Culture adequate volume   Culture   Final    NO GROWTH 3 DAYS Performed at [facility], [address], [place], [place] [address]    Report Status PENDING  Incomplete  Blood Culture (routine x 2)     Status: None (Preliminary result)   Collection Time: 12/07/21  3:03 PM   Specimen: Right Antecubital; Blood  Result Value Ref Range Status   Specimen Description RIGHT ANTECUBITAL  Final   Special Requests   Final    BOTTLES DRAWN AEROBIC AND ANAEROBIC Blood Culture results may not be optimal due to an inadequate volume of blood received in culture bottles   Culture   Final    NO GROWTH 3 DAYS Performed at [facility], [address], [place], [place] [address]    Report Status PENDING  Incomplete     Radiology Studies: No results found.  Scheduled Meds:  apixaban  10 mg Oral BID   Followed by   Derrill Memo ON 12/15/2021] apixaban  5 mg Oral BID   aspirin  81 mg Oral Daily   atorvastatin  80 mg Oral Daily   Chlorhexidine Gluconate Cloth  6 each Topical Q0600   docusate sodium  100  mg Oral Daily   feeding supplement (GLUCERNA SHAKE)  237 mL Oral TID BM   ferrous sulfate  325 mg Oral Daily   gabapentin  100 mg Oral TID   guaiFENesin  600 mg Oral BID   hydrALAZINE  50 mg Oral Q8H   insulin aspart  0-15 Units Subcutaneous TID WC   insulin aspart  14 Units Subcutaneous TID WC   insulin glargine-yfgn  40 Units Subcutaneous Daily   ipratropium-albuterol  3 mL Nebulization Q6H   losartan  100 mg Oral Daily   methylPREDNISolone (SOLU-MEDROL) injection  80 mg Intravenous Q8H   metoprolol tartrate  50 mg Oral BID   mometasone-formoterol  2 puff Inhalation BID   montelukast  10 mg Oral QHS   pantoprazole  40 mg Oral Daily   polyvinyl alcohol  1 drop Both Eyes QID   umeclidinium bromide  1 puff Inhalation Daily   Continuous Infusions:  azithromycin 500 mg (12/10/21 1548)   cefTRIAXone (ROCEPHIN)  IV 2 g (12/10/21 1506)   lactated ringers Stopped (12/09/21 0923)   vancomycin     Followed by   Derrill Memo ON 12/11/2021] vancomycin       LOS: 3 days   Critical Care Procedure Note Authorized and Performed by: Murvin Natal MD  Total Critical Care time:  44 mins Due to a high probability of clinically significant, life threatening deterioration, the patient required my highest level of preparedness to intervene emergently and I personally spent this critical care time directly and personally managing the patient.  This critical care time included obtaining a history; examining the patient, pulse oximetry; ordering and review of studies; arranging urgent treatment with development of a management plan; evaluation of patient's response of treatment; frequent reassessment; and discussions with other providers.  This critical care time was performed to assess and manage the high probability of imminent and life threatening deterioration that could result in multi-organ failure.  It was exclusive of separately billable procedures and treating other patients and teaching time.    Irwin Brakeman, MD How to contact the [facility] Attending or Consulting provider [place] or covering provider during after hours [place],  for this patient?  Check the care team in [facility] and look for a) attending/consulting TRH provider listed and b) the [facility] team listed Log into [email or web address] and use [place]'s universal password to access. If you do not have the password, please contact the hospital operator. Locate the [facility] provider you are looking for under [facility] and page to a number that you can be directly reached. If you still have difficulty reaching the provider, please page the [facility] (Director on Call) for the Hospitalists listed on amion for assistance.  12/10/2021, 4:20 PM

## 2021-12-10 NOTE — Progress Notes (Signed)
Pharmacy Antibiotic Note  Michaela Moon is a 86 y.o. female admitted on 12/07/2021 with pneumonia.  Pharmacy has been consulted for Vancomycin dosing.  Plan: Vancomycin 1500 mg loading dose IV then '1000mg'$  IV Q 24 hrs. Goal AUC 400-550. Expected AUC: 439 SCr used: 0.8 (actual is 0.6) Also on ceftriaxone 2gm IV q24h and azithromycin '500mg'$  IV q24 F/U cxs and clinical progress Monitor V/S, labs and levels as indicated  Height: '5\' 5"'$  (165.1 cm) Weight: 80.7 kg (177 lb 14.6 oz) IBW/kg (Calculated) : 57  Temp (24hrs), Avg:97.9 F (36.6 C), Min:97.7 F (36.5 C), Max:98.2 F (36.8 C)  Recent Labs  Lab 12/04/21 0430 12/07/21 1458 12/07/21 1642 12/08/21 0344 12/09/21 0343 12/10/21 0418  WBC 11.4* 15.3*  --  9.4 12.1* 11.4*  CREATININE 0.65 0.77  --  0.68 0.70 0.60  LATICACIDVEN  --  2.8* 2.3*  --   --   --     Estimated Creatinine Clearance: 55 mL/min (by C-G formula based on SCr of 0.6 mg/dL).    Allergies  Allergen Reactions   Onion     wheezing    Antimicrobials this admission: Vancomycin 10/16 >>  ceftrixone 10/13 >> Azithromycin 10/13  Microbiology results: 10/13 BCX: ngtd 10/16 MRSA PCR : TBC  Thank you for allowing pharmacy to be a part of this patient's care.  Isac Sarna, BS Pharm D, BCPS Clinical Pharmacist 12/10/2021 3:28 PM

## 2021-12-10 NOTE — Progress Notes (Signed)
Patient placed back on Heated High flow 25 liters , 100 percent oxygen. Around 2130 patients oxygen decreased into 80's on 15 liter [place] HFNC

## 2021-12-10 NOTE — Consult Note (Signed)
NAME:  Michaela Moon, MRN:  [id number], DOB:  [birth year], LOS: 3 ADMISSION DATE:  12/07/2021, CONSULTATION DATE:  12/10/2021  REFERRING MD:  Wynetta Emery, TRH, CHIEF COMPLAINT:  resp failure on HF [place]   History of Present Illness:  86 year old ex-smoker with COPD and chronic respiratory failure on 2 L of oxygen who was admitted for worsening hypoxia requiring up to 5 L of oxygen at home. Her oxygen requirements have increased starting on 15 L high flow in the emergency room and now up to 100% on 30 L heated high flow nasal cannula. Initial labs were significant for WBC count of 15.3, BNP 116, COVID testing was negative Chest x-ray showed partial clearing of bibasilar airspace disease.  Of note, she was hospitalized 9/2 to 9/12 and treated for COVID-19 pneumonia With Paxlovid, Decadron and empiric antibiotic discharge on 2 L She was again hospitalized 10/4 to 10/10 with multifocal pneumonia and treated with Rocephin and azithromycin, weaned down to 5 L nasal cannula  Pertinent  Medical History  hypertension,  T2DM,  dysphagia requiring dilation 04/2016  Significant Hospital Events: Including procedures, antibiotic start and stop dates in addition to other pertinent events   CT angiogram chest 12/08/2021 multilobar airspace disease with similar pattern compared to January 2023.  Lingular opacity appears chronic, lower lobe airspace disease is worse, right upper lobe spiculated nodule unchanged from prior   Interim History / Subjective:  On heated high flow nasal cannula 100% / 25 L Son at bedside corroborates history  Objective   Blood pressure (!) 162/46, pulse 72, temperature 97.8 F (36.6 C), temperature source Oral, resp. rate 19, height '5\' 5"'$  (1.651 m), weight 80.7 kg, SpO2 98 %.    FiO2 (%):  [85 %-100 %] 100 %   Intake/Output Summary (Last 24 hours) at 12/10/2021 1329 Last data filed at 12/10/2021 0944 Gross per 24 hour  Intake 392.05 ml  Output 1675 ml  Net -1282.95 ml    Filed Weights   12/07/21 1900 12/09/21 0553 12/10/21 0434  Weight: 81.1 kg 80.7 kg 80.7 kg    Examination: General: Elderly woman, sitting up in bed, no distress HENT: Mild pallor, no icterus, no JVD Lungs: Scattered crackles bases, no accessory muscle use Cardiovascular: S1-S2 regular, no murmur Abdomen: Soft, obese, nontender, no hepatosplenomegaly Extremities: No significant edema, no deformity Neuro: Alert, interactive, nonfocal  Chest x-ray 10/14 independently reviewed shows bilateral interstitial and airspace disease both bases  Labs show normal electrolytes, mild leukocytosis, stable hemoglobin, negative procalcitonin  Resolved Hospital Problem list     Assessment & Plan:  Acute hypoxic respiratory failure Bilateral infiltrates -lingular patchy infiltrate dates back to January 2023 and both lower lobe infiltrates appear gradually worse compared to July 2023 on serial CT imaging  -Differential includes chronic aspiration versus inflammatory etiology, doubt malignancy.  Right upper lobe pulmonary nodule appears stable since January 2023  -Continue ceftriaxone/azithromycin, add vancomycin and check MRSA PCR. -Increase IV Solu-Medrol 80 every 8, expect sugars to be high and may need increased insulin to correct -Unfortunately she is too hypoxic to perform bronchoscopy safely. -If worsens, can use BiPAP for short time -She is at high risk for mechanical ventilation, I initiated goals of care discussion with patient and her son.  Patient initially requested full CODE STATUS but at son's insistence, wanted some more time to discuss with her family.   Dysphagia -esophageal dilatation 2018 for?  Web. -Swallow evaluation/MBS planned for 10/17  Uncontrolled type 2 diabetes -expect to worsen with increasing Solu-Medrol -  Semglee 30 units plus NovoLog 10 units 3 times daily with meals -SSI coverage  Bilateral calf DVT -apixaban started by [facility]  Best Practice (right click and  "Reselect all SmartList Selections" daily)   Diet/type: Regular consistency (see orders) DVT prophylaxis: DOAC GI prophylaxis: N/A Lines: N/A Foley:  N/A Code Status:  full code Last date of multidisciplinary goals of care discussion [ongoing, palliative care to follow]  Labs   CBC: Recent Labs  Lab 12/04/21 0430 12/07/21 1458 12/08/21 0344 12/09/21 0343 12/10/21 0418  WBC 11.4* 15.3* 9.4 12.1* 11.4*  NEUTROABS  --  10.6*  --   --   --   HGB 8.5* 9.8* 8.6* 8.5* 8.7*  HCT 28.9* 32.8* 29.0* 28.8* 29.3*  MCV 80.3 82.2 81.2 82.1 81.4  PLT 266 208 182 170 371    Basic Metabolic Panel: Recent Labs  Lab 12/04/21 0430 12/07/21 1458 12/08/21 0344 12/09/21 0343 12/10/21 0418  NA 139 139 138 138 137  K 4.1 3.7 4.5 4.9 4.8  CL 106 107 106 104 104  CO2 26 21* '25 28 27  '$ GLUCOSE 139* 88 292* 297* 196*  BUN '9 11 12 15 16  '$ CREATININE 0.65 0.77 0.68 0.70 0.60  CALCIUM 10.3 10.5* 10.1 10.1 10.1  MG 1.6*  --  1.5* 1.8 2.0  PHOS  --   --  3.9  --   --    GFR: Estimated Creatinine Clearance: 55 mL/min (by C-G formula based on SCr of 0.6 mg/dL). Recent Labs  Lab 12/07/21 1458 12/07/21 1642 12/08/21 0344 12/09/21 0343 12/10/21 0418  PROCALCITON  --   --  <0.10  --   --   WBC 15.3*  --  9.4 12.1* 11.4*  LATICACIDVEN 2.8* 2.3*  --   --   --     Liver Function Tests: Recent Labs  Lab 12/07/21 1458 12/08/21 0344  AST 32 23  ALT 34 26  ALKPHOS 52 45  BILITOT 0.4 0.5  PROT 7.2 6.4*  ALBUMIN 3.3* 2.8*   No results for input(s): "LIPASE", "AMYLASE" in the last 168 hours. No results for input(s): "AMMONIA" in the last 168 hours.  ABG    Component Value Date/Time   PHART 7.44 12/07/2021 1518   PCO2ART 37 12/07/2021 1518   PO2ART 74 (L) 12/07/2021 1518   HCO3 25.1 12/07/2021 1518   O2SAT 95.6 12/07/2021 1518     Coagulation Profile: Recent Labs  Lab 12/07/21 1458  INR 1.1    Cardiac Enzymes: No results for input(s): "CKTOTAL", "CKMB", "CKMBINDEX", "TROPONINI"  in the last 168 hours.  HbA1C: Hgb A1c MFr Bld  Date/Time Value Ref Range Status  12/08/2021 04:46 AM 6.3 (H) 4.8 - 5.6 % Final    Comment:    (NOTE) Pre diabetes:          5.7%-6.4%  Diabetes:              >6.4%  Glycemic control for   <7.0% adults with diabetes   10/21/2017 11:18 AM 7.5 (H) 4.8 - 5.6 % Final    Comment:    (NOTE) Pre diabetes:          5.7%-6.4% Diabetes:              >6.4% Glycemic control for   <7.0% adults with diabetes     CBG: Recent Labs  Lab 12/09/21 1953 12/10/21 0251 12/10/21 0433 12/10/21 0710 12/10/21 1136  GLUCAP 150* 199* 192* 156* 237*    Review of Systems:  Shortness of breath Dry cough Leg swelling  Constitutional: negative for anorexia, fevers and sweats  Eyes: negative for irritation, redness and visual disturbance  Ears, nose, mouth, throat, and face: negative for earaches, epistaxis, nasal congestion and sore throat  Respiratory: negative for sputum and wheezing  Cardiovascular: negative for chest pain,  orthopnea, palpitations and syncope  Gastrointestinal: negative for abdominal pain, constipation, diarrhea, melena, nausea and vomiting  Genitourinary:negative for dysuria, frequency and hematuria  Hematologic/lymphatic: negative for bleeding, easy bruising and lymphadenopathy  Musculoskeletal:negative for arthralgias, muscle weakness and stiff joints  Neurological: negative for coordination problems, gait problems, headaches and weakness  Endocrine: negative for diabetic symptoms including polydipsia, polyuria and weight loss   Past Medical History:  She,  has a past medical history of Arthritis, Asthma, COPD (chronic obstructive pulmonary disease) ([place]), Diabetes mellitus without complication ([place]), GERD (gastroesophageal reflux disease), Headache, Hyperlipidemia, Hypertension, Macular degeneration, Neuropathy, Obesity, Osteopenia, and Stress incontinence.   Surgical History:   Past Surgical History:  Procedure  Laterality Date   BREAST LUMPECTOMY Bilateral    no cancer   CARPAL TUNNEL RELEASE Left 10/23/2017   Procedure: LEFT CARPAL TUNNEL RELEASE;  Surgeon: Carole Civil, MD;  Location: [facility];  Service: Orthopedics;  Laterality: Left;   CARPAL TUNNEL RELEASE Right 11/20/2017   Procedure: CARPAL TUNNEL RELEASE;  Surgeon: Carole Civil, MD;  Location: [facility];  Service: Orthopedics;  Laterality: Right;   CATARACT EXTRACTION W/PHACO Left 11/01/2016   Procedure: CATARACT EXTRACTION PHACO AND INTRAOCULAR LENS PLACEMENT (IOC);  Surgeon: Baruch Goldmann, MD;  Location: [facility];  Service: Ophthalmology;  Laterality: Left;  CDE: 3.83   CATARACT EXTRACTION W/PHACO Right 11/29/2016   Procedure: CATARACT EXTRACTION PHACO AND INTRAOCULAR LENS PLACEMENT RIGHT EYE;  Surgeon: Baruch Goldmann, MD;  Location: [facility];  Service: Ophthalmology;  Laterality: Right;  CDE: 8.71   colon tumor removed  2010   Dr. 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 submucosal lipoma on path   COLONOSCOPY  05/2013   Dr. Anthony Sar: normal. (h/o colon polyps)   COLONOSCOPY  2011   normal   COLONOSCOPY  10/2003   Dr. Lindalou Hose: large polypoid mass hepatic fluexure   ESOPHAGOGASTRODUODENOSCOPY N/A 04/29/2016   Procedure: ESOPHAGOGASTRODUODENOSCOPY (EGD);  Surgeon: Danie Binder, MD;  Location: [facility];  Service: Endoscopy;  Laterality: N/A;  9:15am   SAVORY DILATION N/A 04/29/2016   Procedure: SAVORY DILATION;  Surgeon: Danie Binder, MD;  Location: [facility];  Service: Endoscopy;  Laterality: N/A;     Social History:   reports that she quit smoking about 19 years ago. Her smoking use included cigarettes. She has a 40.00 pack-year smoking history. She has never used smokeless tobacco. She reports that she does not drink alcohol and does not use drugs.   Family History:  Her family history includes Melanoma in her brother. There is no history of Colon cancer.   Allergies Allergies   Allergen Reactions   Onion     wheezing     Home Medications  Prior to Admission medications   Medication Sig Start Date End Date Taking? Authorizing Provider  albuterol (PROVENTIL) (2.5 MG/3ML) 0.083% nebulizer solution Take 3 mLs (2.5 mg total) by nebulization every 6 (six) hours as needed for wheezing or shortness of breath. 02/14/21  Yes Chesley Mires, MD  albuterol-ipratropium (COMBIVENT) 18-103 MCG/ACT inhaler Inhale 2 puffs into the lungs every 6 (six) hours as needed for wheezing. 11/27/12  Yes Vernie Shanks, MD  BIOTIN PO Take by mouth.   Yes [provider]  Budeson-Glycopyrrol-Formoterol (BREZTRI AEROSPHERE) 160-9-4.8 MCG/ACT AERO Inhale 2 puffs into the lungs in the morning and at bedtime. 03/08/21  Yes Chesley Mires, MD  docusate sodium (COLACE) 100 MG capsule Take 1 capsule (100 mg total) by mouth daily. 12/04/21 12/04/22 Yes Shah, Pratik D, DO  ferrous sulfate 325 (65 FE) MG tablet Take 1 tablet (325 mg total) by mouth daily. 12/04/21 12/04/22 Yes Shah, Pratik D, DO  fluticasone (FLONASE) 50 MCG/ACT nasal spray Place 1 spray into both nostrils daily. 10/19/20  Yes Chesley Mires, MD  gabapentin (NEURONTIN) 400 MG capsule Take 400 mg by mouth 3 (three) times daily.   Yes [provider]  glimepiride (AMARYL) 2 MG tablet Take 4 mg by mouth daily with breakfast.    Yes [provider]  guaiFENesin (MUCINEX) 600 MG 12 hr tablet Take 2 tablets (1,200 mg total) by mouth 2 (two) times daily as needed for cough or to loosen phlegm. 02/14/21  Yes Chesley Mires, MD  Hypromellose (ARTIFICIAL TEARS OP) Place 1 drop into both eyes 4 (four) times daily.   Yes [provider]  ipratropium (ATROVENT) 0.03 % nasal spray Place 2 sprays into both nostrils every 12 (twelve) hours. 02/14/21  Yes Chesley Mires, MD  Ipratropium-Albuterol (COMBIVENT) 20-100 MCG/ACT AERS respimat Inhale 1 puff into the lungs every 6 (six) hours. 10/01/21  Yes Chesley Mires, MD  LANTUS SOLOSTAR  100 UNIT/ML Solostar Pen Inject 20 Units into the skin in the morning and at bedtime. 11/22/20  Yes [provider]  losartan-hydrochlorothiazide (HYZAAR) 100-12.5 MG per tablet TAKE ONE (1) TABLET EACH DAY Patient taking differently: Take 1 tablet by mouth daily. 03/09/14  Yes Chipper Herb, MD  metoprolol (LOPRESSOR) 50 MG tablet Take 50 mg by mouth 2 (two) times daily.   Yes [provider]  montelukast (SINGULAIR) 10 MG tablet TAKE ONE TABLET BY MOUTH AT BEDTIME 07/30/21  Yes Chesley Mires, MD  Multiple Vitamins-Minerals (CENTRUM SILVER PO) Take 1 tablet by mouth daily.   Yes [provider]  Omega-3 Fatty Acids (FISH OIL) 1000 MG CAPS Take 1,000 mg by mouth in the morning, at noon, and at bedtime.   Yes [provider]  oxybutynin (DITROPAN) 5 MG tablet Take 5 mg by mouth every 8 (eight) hours as needed for bladder spasms.    Yes [provider]  pantoprazole (PROTONIX) 40 MG tablet Take 40 mg by mouth daily. 10/23/21  Yes [provider]  sitaGLIPtin-metformin (JANUMET) 50-1000 MG tablet Take 1 tablet by mouth 2 (two) times daily with a meal.   Yes [provider]  triamcinolone cream (KENALOG) 0.1 % Apply 1 application topically daily as needed (eczema).   Yes [provider]  aspirin 81 MG tablet Take 81 mg by mouth daily.    [provider]  atorvastatin (LIPITOR) 80 MG tablet ALTERNATE TAKING 1 TABLET EVERY OTHER DAY WITH 1/2 TABLET EVERY OTHERDAY 01/13/14   Chipper Herb, MD  Azelastine HCl (ASTEPRO) 0.15 % SOLN Place 1 spray into the nose at bedtime. 04/24/21   Chesley Mires, MD  Biotin w/ Vitamins C & E (HAIR/SKIN/NAILS PO) Take 1 tablet by mouth daily. Takes once a day.    [provider]     Critical care time: 47m      RKara MeadMD. FOutpatient Surgery Center Of [facility] [place] [facility] Pager : [phone]  If no response to pager , please call  [phone] until 7 pm After 7:00 pm call Elink  [phone]    12/10/2021

## 2021-12-10 NOTE — Consult Note (Signed)
Palliative Care Consult Note                                  Date: 12/10/2021   Patient Name: Michaela Moon  DOB: August 18, 1935  MRN: [id number]  Age / Sex: 86 y.o., female  PCP: [name], MD Referring Physician: Murlean Iba, MD  Reason for Consultation: Establishing goals of care  HPI/Patient Profile: 86 y.o. female  with past medical history of hypertension, T2DM, COPD, dysphagia requiring dilation who presents to the emergency department due to shortness of breath.    She was recently admitted 10/4-10/10 for acute on chronic hypoxemic respiratory failure secondary to multifocal pneumonia treated with IV Rocephin and azithromycin.  She was discharged home and shortly thereafter became hypoxic despite 5 L/min nasal cannula (baseline is 2 L/min) and subsequently brought to the ED. She was admitted on 12/07/2021 with acute on chronic respiratory failure with hypoxia, multifocal pneumonia, lactic acidosis, dysphagia, and others.  Past Medical History:  Diagnosis Date   Arthritis    Asthma    COPD (chronic obstructive pulmonary disease) ([place])    Diabetes mellitus without complication (HCC)    GERD (gastroesophageal reflux disease)    Headache    Hyperlipidemia    Hypertension    Macular degeneration    Neuropathy    Obesity    Osteopenia    Stress incontinence     Subjective:   This NP Walden Field reviewed medical records, received report from team, assessed the patient and then meet at the patient's bedside to discuss diagnosis, prognosis, GOC, EOL wishes disposition and options.  I met with the patient, her son, her sister, and her brother-in-law at the bedside.   Concept of Palliative Care was introduced as specialized medical care for people and their families living with serious illness.  If focuses on providing relief from the symptoms and stress of a serious illness.  The goal is to improve quality of life for  both the patient and the family. Values and goals of care important to patient and family were attempted to be elicited.  Created space and opportunity for patient  and family to explore thoughts and feelings regarding current medical situation   Natural trajectory and current clinical status were discussed. Questions and concerns addressed. Patient  encouraged to call with questions or concerns.    Patient/Family Understanding of Illness: We discussed the patient's current chronic and acute health conditions.  They understand she has been admitted for the third time in 1 month for pneumonia.  The first time was related to COVID-19, the second was bacterial, and this point appears to be bacterial as well.  In addition, they states she has had pneumonia multiple times where she was not brought to the hospital and not admitted.  We expressed concern over her repeated admissions with underlying history of COPD.  We reviewed and discussed her acute and chronic conditions as they relate to her current admission and health status.  Life Review: The patient has one son Michaela Moon.  During our discussions the patient became tearful and stated that he is all she has and she is all he has.  Her son was also tearful during this discussion.  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  He states that he does not think she understands a lot about complex health issues.  However, she is not overtly confused off of her baseline.  Patient Values: Family, faith  Goals: To be determined  Today's Discussion: When I met with the patient and her family at the bedside we had a good and extensive discussion about CODE STATUS.  I shared Dr. Elsworth Soho with pulmonary critical care medicine's opinion that she is high risk for worsening respiratory compromise.  He discussed CODE STATUS with the patient and her son earlier in the day.  The patient seemed to indicate that  she wanted to be full code, but the son seems surprised that this.  They requested additional time to discuss.  It has now been several hours and I am back to continue these discussions.  The patient's family asked several very pertinent questions.  We explained the meaning of "DNR" versus full code.  We discussed the statistical probabilities of successful resuscitation in patients with chronic illness, the discomfort that cardiopulmonary resuscitation can result in, and the idea of doing something "for someone" versus just doing something "to someone" (for example, with a low chance of success).  The patient verbalized that she understands, but her responses and facial expressions make me think that she does not somewhat understand.  Her son stepped in and stated "do remember several years ago when asked if he ever wanted to live on a breathing tube and he said no" and the patient nodded that yes she remembers.  At this point it seems that intubation would not be in line with her goals.  We discussed the opinion of Dr. Elsworth Soho that any cardiopulmonary arrest would likely be respiratory in nature and if they are not wanting to proceed with any intubation then it would be recommended to be a full DNR as CPR, defibrillation would not likely help without respiratory intervention.  We discussed that DNR does not mean "do not treat".  She will continue to receive full scope of care through medications, fluids, testing, treatments to promote her full recovery up to but stopping before cardiopulmonary resuscitation.  The patient's family seems understand this, but the patient seems a little confused by it.  Finally I discussed the apparent aspiration nature of her repeated pneumonia and that this would likely be ongoing.  However, a swallow eval has been ordered for tomorrow to further evaluate this.  If she is indeed high risk for aspiration then we can make adjustments to her diet and discuss this in the broader  picture of her overall health and goals.  Finally, I cannot be assured about her capacity.  She knows her name and that she is in a hospital.  However, she is unsure of what hospital she is at or the year.  Also, after some prompting, she is able to tell me that she is here because "my oxygen is low."  We discussed that in this situation that the patient could not make decisions that legally her son is her surrogate decision-maker.  Family has asked for more time to discuss amongst themselves.  I told him that I would be back tomorrow and we can continue discussions.  I emphasized that it is their decision and we are simply here to guide the discussion.  They verbalized understanding.  I provided emotional and general support through therapeutic listening, empathy, sharing of stories, and other techniques. I answered all questions and addressed all concerns to the best of my ability.  Review of Systems  Constitutional:  Positive for fatigue.  Respiratory:  Positive for shortness of breath.  Negative for cough.   Gastrointestinal:  Negative for abdominal pain, nausea and vomiting.    Objective:   Primary Diagnoses: Present on Admission:  Acute on chronic respiratory failure with hypoxia (HCC)  GERD (gastroesophageal reflux disease)  Acute exacerbation of chronic obstructive pulmonary disease (COPD) (HCC)  Essential hypertension  Mixed hyperlipidemia  Multifocal pneumonia  Lactic acidosis  Iron deficiency anemia  Hypoalbuminemia due to protein-calorie malnutrition (HCC)  Elevated brain natriuretic peptide (BNP) level   Physical Exam Vitals and nursing note reviewed.  HENT:     Head: Normocephalic and atraumatic.  Cardiovascular:     Rate and Rhythm: Normal rate.  Pulmonary:     Effort: Pulmonary effort is normal. No respiratory distress.     Comments: 30 lpm HHFNC in place Neurological:     Mental Status: She is alert. She is disoriented and confused.  Psychiatric:        Mood  and Affect: Mood normal.        Behavior: Behavior normal.     Comments: Intermittently tearful, appropriately     Vital Signs:  BP (!) 157/57   Pulse 80   Temp 97.8 F (36.6 C) (Oral)   Resp 16   Ht _0  (1.651 m)   Wt 80.7 kg   SpO2 (!) 89%   BMI 29.61 kg/m   Palliative Assessment/Data: 20-30%    Advanced Care Planning:   Primary Decision Maker: PATIENT with son's input  Code Status/Advance Care Planning: Full code  A discussion was had today regarding advanced directives. Concepts specific to code status, artifical feeding and hydration, continued IV antibiotics and rehospitalization was had.  The difference between a aggressive medical intervention path and a palliative comfort care path for this patient at this time was had.   Decisions/Changes to ACP: None today  Assessment & Plan:   Impression: 86 year old female with repeated admissions for pneumonia, possible aspiration with planned swallow study tomorrow.  She has required increasing levels of oxygen and is now on 30 L/min heated high flow nasal cannula.  Neck step would be BiPAP.  We had a frank discussion on CODE STATUS, whether there is a desire for intubation and cardiopulmonary resuscitation.  Family is requesting more time to discuss this.  Overall prognosis poor.  SUMMARY OF RECOMMENDATIONS   Remain full code for now Return tomorrow to discuss CODE STATUS further Further goals of care discussions ongoing as well Continued patient and family emotional support PMT will continue to follow  Symptom Management:  Per primary team PMT is available to assist as needed  Prognosis:  Unable to determine  Discharge Planning:  To Be Determined   Discussed with: Patient, family, medical team, nursing team    Thank you for allowing [place] to participate in the care of EMILEA GOGA PMT will continue to support holistically.  Time Total: 80 min  Greater than 50%  of this time was spent counseling and  coordinating care related to the above assessment and plan.  Signed by: Walden Field, NP Palliative Medicine Team  Team Phone # [phone] (Nights/Weekends)  12/10/2021, 4:10 PM

## 2021-12-11 ENCOUNTER — Inpatient Hospital Stay (HOSPITAL_COMMUNITY): Payer: Medicare Other

## 2021-12-11 DIAGNOSIS — Z66 Do not resuscitate: Secondary | ICD-10-CM | POA: Diagnosis not present

## 2021-12-11 DIAGNOSIS — Z7189 Other specified counseling: Secondary | ICD-10-CM | POA: Diagnosis not present

## 2021-12-11 DIAGNOSIS — J189 Pneumonia, unspecified organism: Secondary | ICD-10-CM | POA: Diagnosis not present

## 2021-12-11 DIAGNOSIS — J9601 Acute respiratory failure with hypoxia: Secondary | ICD-10-CM | POA: Diagnosis not present

## 2021-12-11 DIAGNOSIS — Z515 Encounter for palliative care: Secondary | ICD-10-CM | POA: Diagnosis not present

## 2021-12-11 DIAGNOSIS — R1319 Other dysphagia: Secondary | ICD-10-CM | POA: Diagnosis not present

## 2021-12-11 DIAGNOSIS — J441 Chronic obstructive pulmonary disease with (acute) exacerbation: Secondary | ICD-10-CM | POA: Diagnosis not present

## 2021-12-11 DIAGNOSIS — J9621 Acute and chronic respiratory failure with hypoxia: Secondary | ICD-10-CM | POA: Diagnosis not present

## 2021-12-11 DIAGNOSIS — E119 Type 2 diabetes mellitus without complications: Secondary | ICD-10-CM | POA: Diagnosis not present

## 2021-12-11 LAB — CBC WITH DIFFERENTIAL/PLATELET
Abs Immature Granulocytes: 0.19 10*3/uL — ABNORMAL HIGH (ref 0.00–0.07)
Basophils Absolute: 0 10*3/uL (ref 0.0–0.1)
Basophils Relative: 0 %
Eosinophils Absolute: 0 10*3/uL (ref 0.0–0.5)
Eosinophils Relative: 0 %
HCT: 31.3 % — ABNORMAL LOW (ref 36.0–46.0)
Hemoglobin: 9.3 g/dL — ABNORMAL LOW (ref 12.0–15.0)
Immature Granulocytes: 2 %
Lymphocytes Relative: 12 %
Lymphs Abs: 1.4 10*3/uL (ref 0.7–4.0)
MCH: 24.7 pg — ABNORMAL LOW (ref 26.0–34.0)
MCHC: 29.7 g/dL — ABNORMAL LOW (ref 30.0–36.0)
MCV: 83.2 fL (ref 80.0–100.0)
Monocytes Absolute: 0.2 10*3/uL (ref 0.1–1.0)
Monocytes Relative: 2 %
Neutro Abs: 10.1 10*3/uL — ABNORMAL HIGH (ref 1.7–7.7)
Neutrophils Relative %: 84 %
Platelets: 168 10*3/uL (ref 150–400)
RBC: 3.76 MIL/uL — ABNORMAL LOW (ref 3.87–5.11)
RDW: 27 % — ABNORMAL HIGH (ref 11.5–15.5)
WBC: 11.9 10*3/uL — ABNORMAL HIGH (ref 4.0–10.5)
nRBC: 0 % (ref 0.0–0.2)

## 2021-12-11 LAB — MRSA NEXT GEN BY PCR, NASAL: MRSA by PCR Next Gen: NOT DETECTED

## 2021-12-11 LAB — GLUCOSE, CAPILLARY
Glucose-Capillary: 220 mg/dL — ABNORMAL HIGH (ref 70–99)
Glucose-Capillary: 379 mg/dL — ABNORMAL HIGH (ref 70–99)
Glucose-Capillary: 262 mg/dL — ABNORMAL HIGH (ref 70–99)
Glucose-Capillary: 218 mg/dL — ABNORMAL HIGH (ref 70–99)
Glucose-Capillary: 222 mg/dL — ABNORMAL HIGH (ref 70–99)

## 2021-12-11 LAB — BASIC METABOLIC PANEL
Anion gap: 8 (ref 5–15)
BUN: 19 mg/dL (ref 8–23)
CO2: 25 mmol/L (ref 22–32)
Calcium: 10.3 mg/dL (ref 8.9–10.3)
Chloride: 104 mmol/L (ref 98–111)
Creatinine, Ser: 0.55 mg/dL (ref 0.44–1.00)
GFR, Estimated: >60 mL/min (ref >60)
Glucose, Bld: 245 mg/dL — ABNORMAL HIGH (ref 70–99)
Potassium: 4.9 mmol/L (ref 3.5–5.1)
Sodium: 137 mmol/L (ref 135–145)

## 2021-12-11 LAB — PHOSPHORUS: Phosphorus: 2.8 mg/dL (ref 2.5–4.6)

## 2021-12-11 LAB — MAGNESIUM: Magnesium: 1.7 mg/dL (ref 1.7–2.4)

## 2021-12-11 MED ORDER — INSULIN ASPART 100 UNIT/ML IJ SOLN
18.0000 [IU] | Freq: Three times a day (TID) | INTRAMUSCULAR | Status: DC
  Administered 2021-12-13 – 2021-12-19 (×19): 18 [IU] via SUBCUTANEOUS

## 2021-12-11 MED ORDER — INSULIN GLARGINE-YFGN 100 UNIT/ML ~~LOC~~ SOLN
55.0000 [IU] | Freq: Every day | SUBCUTANEOUS | Status: DC
  Administered 2021-12-11 – 2021-12-13 (×3): 55 [IU] via SUBCUTANEOUS
  Filled 2021-12-11 (×4): qty 0.55

## 2021-12-11 MED ORDER — ORAL CARE MOUTH RINSE: 15.0000 mL | OROMUCOSAL | Status: DC | PRN

## 2021-12-11 NOTE — Consult Note (Signed)
NAME:  Michaela Moon, MRN:  [id number], DOB:  1936-01-24, LOS: 4 ADMISSION DATE:  12/07/2021, CONSULTATION DATE:  12/11/2021  REFERRING MD:  Wynetta Emery, TRH, CHIEF COMPLAINT:  resp failure on HF [place]   History of Present Illness:  86 year old ex-smoker with COPD and chronic respiratory failure on 2 L of oxygen who was admitted for worsening hypoxia requiring up to 5 L of oxygen at home. Her oxygen requirements have increased starting on 15 L high flow in the emergency room and now up to 100% on 30 L heated high flow nasal cannula. Initial labs were significant for WBC count of 15.3, BNP 116, COVID testing was negative Chest x-ray showed partial clearing of bibasilar airspace disease.  Of note, she was hospitalized 9/2 to 9/12 and treated for COVID-19 pneumonia With Paxlovid, Decadron and empiric antibiotic discharge on 2 L She was again hospitalized 10/4 to 10/10 with multifocal pneumonia and treated with Rocephin and azithromycin, weaned down to 5 L nasal cannula  Pertinent  Medical History  hypertension,  T2DM,  dysphagia requiring dilation 04/2016  Significant Hospital Events: Including procedures, antibiotic start and stop dates in addition to other pertinent events   CT angiogram chest 12/08/2021 multilobar airspace disease with similar pattern compared to January 2023.  Lingular opacity appears chronic, lower lobe airspace disease is worse, right upper lobe spiculated nodule unchanged from prior  10/17 cleared bedside swallow evaluation   Interim History / Subjective:   Critically ill, mildly improved, less her oxygen. On heated high flow 65% / 30 L Afebrile CBGs high  Objective   Blood pressure (!) 175/65, pulse 80, temperature 97.9 F (36.6 C), temperature source Oral, resp. rate 17, height '5\' 5"'$  (1.651 m), weight 81.9 kg, SpO2 92 %.    FiO2 (%):  [65 %-80 %] 65 %   Intake/Output Summary (Last 24 hours) at 12/11/2021 1638 Last data filed at 12/11/2021 0900 Gross per 24  hour  Intake 565.09 ml  Output 1875 ml  Net -1309.91 ml    Filed Weights   12/09/21 0553 12/10/21 0434 12/11/21 0500  Weight: 80.7 kg 80.7 kg 81.9 kg    Examination: General: Elderly woman, sitting up in bed, no distress HENT: Mild pallor, no icterus, no JVD Lungs: No accessory muscle use, scattered bibasilar crackles Cardiovascular: S1-S2 regular, no murmur Abdomen: Soft, obese, nontender, no hepatosplenomegaly Extremities: No significant edema, no deformity Neuro: Alert, interactive, nonfocal  Chest x-ray 10/17 independently reviewed shows mild improvement of interstitial infiltrates especially at the bases  Labs show normal electrolytes, mild leukocytosis, stable hemoglobin, high CBGs  Resolved Hospital Problem list     Assessment & Plan:  Acute hypoxic respiratory failure Bilateral infiltrates -lingular patchy infiltrate dates back to January 2023 and both lower lobe infiltrates appear gradually worse compared to July 2023 on serial CT imaging  -Differential includes chronic aspiration versus inflammatory etiology, doubt malignancy.  Right upper lobe pulmonary nodule appears stable since January 2023  -Continue ceftriaxone/azithromycin,  -Can DC vancomycin since MRSA PCR negative -Continue IV Solu-Medrol 80 every 8, expect sugars to be high and may need increased insulin to correct -Unfortunately she is too hypoxic to perform bronchoscopy safely. -BiPAP as needed only if increased work of breathing    Dysphagia -esophageal dilatation 2018 for?  Web. -Clear bedside swallow evaluation, plan for MBS  Uncontrolled type 2 diabetes -expect to worsen with increasing Solu-Medrol -Increased insulin to Lantus 55  units plus NovoLog 18 units 3 times daily with meals -SSI coverage  Bilateral  calf DVT -apixaban started by [facility]  Best Practice (right click and "Reselect all SmartList Selections" daily)   Diet/type: Regular consistency (see orders) DVT prophylaxis: DOAC GI  prophylaxis: N/A Lines: N/A Foley:  N/A Code Status:  full code Last date of multidisciplinary goals of care discussion  10/17, DNR issued Family updated at bedside  Labs   CBC: Recent Labs  Lab 12/07/21 1458 12/08/21 0344 12/09/21 0343 12/10/21 0418 12/11/21 0308  WBC 15.3* 9.4 12.1* 11.4* 11.9*  NEUTROABS 10.6*  --   --   --  10.1*  HGB 9.8* 8.6* 8.5* 8.7* 9.3*  HCT 32.8* 29.0* 28.8* 29.3* 31.3*  MCV 82.2 81.2 82.1 81.4 83.2  PLT 208 182 170 166 168     Basic Metabolic Panel: Recent Labs  Lab 12/07/21 1458 12/08/21 0344 12/09/21 0343 12/10/21 0418 12/11/21 0308  NA 139 138 138 137 137  K 3.7 4.5 4.9 4.8 4.9  CL 107 106 104 104 104  CO2 21* '25 28 27 25  '$ GLUCOSE 88 292* 297* 196* 245*  BUN '11 12 15 16 19  '$ CREATININE 0.77 0.68 0.70 0.60 0.55  CALCIUM 10.5* 10.1 10.1 10.1 10.3  MG  --  1.5* 1.8 2.0 1.7  PHOS  --  3.9  --   --  2.8    GFR: Estimated Creatinine Clearance: 55.4 mL/min (by C-G formula based on SCr of 0.55 mg/dL). Recent Labs  Lab 12/07/21 1458 12/07/21 1642 12/08/21 0344 12/09/21 0343 12/10/21 0418 12/11/21 0308  PROCALCITON  --   --  <0.10  --   --   --   WBC 15.3*  --  9.4 12.1* 11.4* 11.9*  LATICACIDVEN 2.8* 2.3*  --   --   --   --      Liver Function Tests: Recent Labs  Lab 12/07/21 1458 12/08/21 0344  AST 32 23  ALT 34 26  ALKPHOS 52 45  BILITOT 0.4 0.5  PROT 7.2 6.4*  ALBUMIN 3.3* 2.8*    No results for input(s): "LIPASE", "AMYLASE" in the last 168 hours. No results for input(s): "AMMONIA" in the last 168 hours.  ABG    Component Value Date/Time   PHART 7.44 12/07/2021 1518   PCO2ART 37 12/07/2021 1518   PO2ART 74 (L) 12/07/2021 1518   HCO3 25.1 12/07/2021 1518   O2SAT 95.6 12/07/2021 1518     Coagulation Profile: Recent Labs  Lab 12/07/21 1458  INR 1.1     Cardiac Enzymes: No results for input(s): "CKTOTAL", "CKMB", "CKMBINDEX", "TROPONINI" in the last 168 hours.  HbA1C: Hgb A1c MFr Bld  Date/Time  Value Ref Range Status  12/08/2021 04:46 AM 6.3 (H) 4.8 - 5.6 % Final    Comment:    (NOTE) Pre diabetes:          5.7%-6.4%  Diabetes:              >6.4%  Glycemic control for   <7.0% adults with diabetes   10/21/2017 11:18 AM 7.5 (H) 4.8 - 5.6 % Final    Comment:    (NOTE) Pre diabetes:          5.7%-6.4% Diabetes:              >6.4% Glycemic control for   <7.0% adults with diabetes     CBG: Recent Labs  Lab 12/10/21 2132 12/11/21 0427 12/11/21 0832 12/11/21 1109 12/11/21 1601  GLUCAP 168* 220* 379* 262* 218*    Critical care time: 54m  Kara Mead MD. Shade Flood. [place] [facility] Pager : [phone]  If no response to pager , please call [phone] until 7 pm After 7:00 pm call Elink  [phone]   12/11/2021

## 2021-12-11 NOTE — Progress Notes (Signed)
  Daily Progress Note   Patient Name: Michaela Moon       Date: 12/11/2021 DOB: January 18, 1936  Age: 86 y.o. MRN#: [id number] Attending Physician: Murlean Iba, MD Primary Care Physician: [name], MD Admit Date: 12/07/2021 Length of Stay: 4 days  Reason for Consultation/Follow-up: {Reason for Consult:23484}  HPI/Patient Profile:  ***  Subjective:   Subjective: Chart Reviewed. Updates received. Patient Assessed. Created space and opportunity for patient  and family to explore thoughts and feelings regarding current medical situation.  Today's Discussion: ***  Review of Systems  Objective:   Vital Signs:  BP (!) 154/65   Pulse 69   Temp 97.8 F (36.6 C) (Oral)   Resp 17   Ht '5\' 5"'$  (1.651 m)   Wt 81.9 kg   SpO2 94%   BMI 30.05 kg/m   Physical Exam: Physical Exam  Palliative Assessment/Data: ***   Assessment & Plan:   Impression: Present on Admission:  Acute on chronic respiratory failure with hypoxia (HCC)  GERD (gastroesophageal reflux disease)  Acute exacerbation of chronic obstructive pulmonary disease (COPD) (HCC)  Essential hypertension  Mixed hyperlipidemia  Multifocal pneumonia  Lactic acidosis  Iron deficiency anemia  Hypoalbuminemia due to protein-calorie malnutrition (HCC)  Elevated brain natriuretic peptide (BNP) level  ***  SUMMARY OF RECOMMENDATIONS   ***  Symptom Management:  ***  Code Status: {Palliative Code status:23503}  Prognosis: {Palliative Care Prognosis:23504}  Discharge Planning: {Palliative dispostion:23505}  Discussed with: ***  Thank you for allowing [place] to participate in the care of Michaela Moon PMT will continue to support holistically.  Time Total: ***  Visit consisted of counseling and education dealing with the complex and emotionally intense issues of symptom management and palliative care in the setting of serious and potentially life-threatening illness. Greater than 50%  of this time was spent  counseling and coordinating care related to the above assessment and plan.  Walden Field, NP Palliative Medicine Team  Team Phone # [phone] (Nights/Weekends)  10/24/2020, 8:17 AM

## 2021-12-11 NOTE — Progress Notes (Signed)
Speech Language Pathology Treatment: Dysphagia  Patient Details Name: Michaela Moon MRN: [id number] DOB: [birth year] Today's Date: 12/11/2021 Time: 4315-4008 SLP Time Calculation (min) (ACUTE ONLY): 25 min  Assessment / Plan / Recommendation Clinical Impression  Pt seen for ongoing dysphagia intervention. She is currently still requiring heated high flow 30 liters at 65%, which increases her risk for aspiration, however she does not show overt signs of aspiration when consuming PO today (regular, mech soft, puree, and thin via cup/straw). Pt presented with audible chest congestion before, during, and after PO. When cued to cough, she expectorates mucus. Oropharyngeal swallow appears WNL, however likely negatively impacted by COPD and oxygen demands. Pt also has a history of esophageal dysphagia with dilation in the past. Should could benefit from an esophageal assessment (BPE) or possibly MBSS with esophageal sweep, once her oxygen is titrated. Continue diet as ordered, D3/mech soft and thin liquids when Pt is alert and upright, eat slowly due to respiratory compromise. SLP will continue to follow. Visit discussed with Pt and her sisters.   HPI HPI: 86 y.o. female with medical history significant of hypertension, T2DM, COPD, dysphagia requiring dilation who presents to the emergency department due to shortness of breath.  She was recently admitted from 10/4 to 10/10 due to acute on chronic hypoxemic respiratory failure secondary to multifocal pneumonia which was treated with IV Rocephin and azithromycin. Prior to this, she was admitted at [facility]- admitted 9/2 to 9/12 for acute respiratory failure, required BiPAP.  She was positive for COVID and treated with antibiotics and Paxlovid for multifocal pneumonia. [name] reports to me she has had PNA ~5-7 times in the past 2 years. CXR reveals: "Interval partial clearing of bibasilar airspace opacities, most  consistent with improving pneumonia.  Continued follow-up recommended  to document complete clearing. No new findings identified." BSE requested.      SLP Plan  Continue with current plan of care      Recommendations for follow up therapy are one component of a multi-disciplinary discharge planning process, led by the attending physician.  Recommendations may be updated based on patient status, additional functional criteria and insurance authorization.    Recommendations  Diet recommendations: Dysphagia 3 (mechanical soft);Thin liquid Liquids provided via: Cup;Straw Medication Administration: Whole meds with liquid Supervision: Patient able to self feed Compensations: Slow rate Postural Changes and/or Swallow Maneuvers: Seated upright 90 degrees;Upright 30-60 min after meal                Oral Care Recommendations: Oral care BID;Staff/trained caregiver to provide oral care Follow Up Recommendations: Follow physician's recommendations for discharge plan and follow up therapies Assistance recommended at discharge: Intermittent Supervision/Assistance SLP Visit Diagnosis: Dysphagia, unspecified (R13.10) Plan: Continue with current plan of care         Thank you,  Genene Churn, [place]   [place]  12/11/2021, 2:42 PM

## 2021-12-11 NOTE — Progress Notes (Addendum)
PROGRESS NOTE   Michaela Moon  EGB:[id number] DOB: [birth year] DOA: 12/07/2021 PCP: [name], MD   Chief Complaint  Patient presents with   Shortness of Breath   Level of care: Stepdown  Brief Admission History:  86 y.o. female with medical history significant of hypertension, T2DM, COPD, dysphagia requiring dilation who presents to the emergency department due to shortness of breath. She was recently admitted from 10/4 to 10/10 due to acute on chronic hypoxemic respiratory failure secondary to multifocal pneumonia which was treated with IV Rocephin and azithromycin.  She completed a course of antibiotics while still in the hospital and O2 sat was weaned down to 4 to 5 L via [place].  It appeared that patient was on supplemental oxygen at 2 LPM on returning home after discharge.  Home oxygen was being monitored at home and when the home health nurse went to see patient yesterday, she was slightly hypoxic, home oxygen was monitored within the past 24 hours and was noted to have dropped down into the low 80s and the oxygen was increased to 5 L/min via [place], despite this, patient was still hypoxic with an O2 sat of 82% on arrival to the ED Patient was also recently admitted to [facility] from 9/2 to 9/12 due to multifocal pneumonia and acute respiratory failure with hypoxia due to COVID-19 during which she was transferred to ICU and she required BiPAP, she was treated with Paxlovid for 5 days and she continued Decadron and empiric antibiotic.  She was discharged with supplemental oxygen via [place] at 2 LPM   At bedside, patient was requiring supplemental oxygen via HFNC at 15 L, she was not in any acute distress.  There was concern regarding food getting stuck in her throat/chest.   ED Course:  In the emergency department, she was intermittently tachypneic, BP was 131/57, temperature 97.96F, pulse 100 bpm, O2 sat 95% on NRB.  Work-up in the ED showed WBC 15.3, hemoglobin 8.8, hematocrit 32.8, MCV  82.2, platelets 208.  BMP showed sodium 139, potassium 3.7, chloride 107, bicarb 24, glucose 88, BUN 11, creatinine 0.77.  Lactic acid 2.8 > 2.3, BNP 116 (this was 329 about 9 days ago).  Influenza A, B, SARS coronavirus 2 was negative.  Blood culture was pending.  Chest x-ray showed Interval partial clearing of bibasilar airspace opacities, most consistent with improving pneumonia. Solu-Medrol 25 mg x 1 was given, she was started on IV ceftriaxone and azithromycin, IV hydration was provided.  Hospitalist was asked to admit patient for further evaluation and management.   Assessment and Plan:  Acute on chronic respiratory failure with hypoxia O2 sat dropped to 80s on supplemental oxygen via [place] at 5 LPM and required up to [facility] at 30 L/min to maintain sats Goal pulse ox is >87% no need for pulse ox to be 100%  Goal today is to wean down supplemental oxygen.  Acute exacerbation of COPD Concern about PE given recent covid infection and persistently high D dimer CTA to rule negative for PE but reports concern for atypical/noninfectious pneumonia vs alveolar tumor.  I will consult pulmonologist to see on 12/10/21.  Appreciate pulmonology recommendations.  Continue duo nebs, Mucinex, Solu-Medrol, Singulair azithromycin. Continue Protonix to prevent steroid-induced ulcer Continue incentive spirometry and flutter valve Wean oxygen to maintain O2 sat >87% Follow up TTE: LVEF 60-65% with grade 1 DD.  Multiple recent hospitalizations and remains full code, will ask for palliative consult for goals of care discussions   Multifocal pneumonia -  appears to be resolving on CXR.  Chest CT with concern for noninfectious/atypical pneumonia versus alveolar tumor.  Consulted pulmonology on 10/16.  Procalcitonin <0.10 which is reassuring Patient was started on ceftriaxone and azithromycin Follow up blood culture, sputum culture, urine Legionella, strep pneumo and procalcitonin Continue Tylenol as needed Continue  Mucinex, incentive spirometry, flutter valve  Pulmonary consultation 12/10/21, appreciate recommendations.   Bilateral DVT Confirmed by venous US 10/14 TED  [facility] Apixaban ordered dosed per pharm D  PE was ruled out by CTA    Lactic acidosis from hypoxia Lactic acid 2.8 > 2.3 trended down   Hypoalbuminemia probably secondary to mild protein calorie malnutrition Albumin 3.3, protein supplement will be provided   Dysphagia Last EGD on 04/2016 by Dr. Oneida Alar  showed no endoscopic esophageal abnormality to explain patient's dysphagia. Esophagus dilated per medical record Due to concern for aspiration, swallow eval will be done in the morning   T2DM with steroid induced hyperglycemia  Hemoglobin A1c on 10/8 was 6.1 Continue ISS and hypoglycemia protocol Semglee 55 units plus novolog 18 units TID with meals, plus supplemental coverage and frequent CBG monitoring  CBG (last 3)  Recent Labs    12/10/21 2132 12/11/21 0427 12/11/21 0832  GLUCAP 168* 220* 379*    Iron deficiency anemia Continue ferrous sulfate   Essential hypertension Continue losartan, Lopressor, add hydralazine    Mixed hyperlipidemia Continue Lipitor   GERD Continue Protonix   Peripheral neuropathy Continue Neurontin    DVT prophylaxis: enoxaparin  Code Status: Full  Family Communication: son at bedside 10/14>>10/17 Disposition: Status is: Inpatient Remains inpatient appropriate because: IV antibiotics, IV steroids, high oxygen requirements   Consultants:  Pulmonary for 10/16 Procedures:   Antimicrobials:  Ceftriaxone / azithromycin 10/13>>  Vancomycin 10/16>> Subjective: Pt reports a lot of rattles in chest but says she is less short of breath.  No Chest pain.  No leg pain.       Objective: Vitals:   12/11/21 0500 12/11/21 0525 12/11/21 0718 12/11/21 0838  BP:  (!) 167/59  (!) 162/50  Pulse:    99  Resp:      Temp:      TempSrc:      SpO2:   98%   Weight: 81.9 kg     Height:         Intake/Output Summary (Last 24 hours) at 12/11/2021 0912 Last data filed at 12/11/2021 0900 Gross per 24 hour  Intake 957.14 ml  Output 1875 ml  Net -917.86 ml   Filed Weights   12/09/21 0553 12/10/21 0434 12/11/21 0500  Weight: 80.7 kg 80.7 kg 81.9 kg   Examination:  General exam: chronically ill appearing, awake, alert, eating breakfast, Appears calm and comfortable  Respiratory system:  on high flow [place], coarse sounds heard, rales heard bilaterally. Exp wheezing heard bilaterally. Cardiovascular system: normal S1 & S2 heard. No JVD, murmurs, rubs, gallops or clicks. No pedal edema. Gastrointestinal system: Abdomen is nondistended, soft and nontender. No organomegaly or masses felt. Normal bowel sounds heard. Central nervous system: Alert and oriented. No focal neurological deficits. Extremities: peripheral edema BLEs.  Symmetric 5 x 5 power. Skin: No rashes, lesions or ulcers. Psychiatry: Judgement and insight appear normal. Mood & affect appropriate.   Data Reviewed: I have personally reviewed following labs and imaging studies  CBC: Recent Labs  Lab 12/07/21 1458 12/08/21 0344 12/09/21 0343 12/10/21 0418 12/11/21 0308  WBC 15.3* 9.4 12.1* 11.4* 11.9*  NEUTROABS 10.6*  --   --   --  10.1*  HGB 9.8* 8.6* 8.5* 8.7* 9.3*  HCT 32.8* 29.0* 28.8* 29.3* 31.3*  MCV 82.2 81.2 82.1 81.4 83.2  PLT 208 182 170 166 157    Basic Metabolic Panel: Recent Labs  Lab 12/07/21 1458 12/08/21 0344 12/09/21 0343 12/10/21 0418 12/11/21 0308  NA 139 138 138 137 137  K 3.7 4.5 4.9 4.8 4.9  CL 107 106 104 104 104  CO2 21* '25 28 27 25  '$ GLUCOSE 88 292* 297* 196* 245*  BUN '11 12 15 16 19  '$ CREATININE 0.77 0.68 0.70 0.60 0.55  CALCIUM 10.5* 10.1 10.1 10.1 10.3  MG  --  1.5* 1.8 2.0 1.7  PHOS  --  3.9  --   --  2.8    CBG: Recent Labs  Lab 12/10/21 1136 12/10/21 1555 12/10/21 2132 12/11/21 0427 12/11/21 0832  GLUCAP 237* 135* 168* 220* 379*    Recent Results (from the  past 240 hour(s))  Resp Panel by RT-PCR (Flu A&B, Covid) Anterior Nasal Swab     Status: None   Collection Time: 12/07/21  2:58 PM   Specimen: Anterior Nasal Swab  Result Value Ref Range Status   SARS Coronavirus 2 by RT PCR NEGATIVE NEGATIVE Final    Comment: (NOTE) SARS-CoV-2 target nucleic acids are NOT DETECTED.  The SARS-CoV-2 RNA is generally detectable in upper respiratory specimens during the acute phase of infection. The lowest concentration of SARS-CoV-2 viral copies this assay can detect is 138 copies/mL. A negative result does not preclude SARS-Cov-2 infection and should not be used as the sole basis for treatment or other patient management decisions. A negative result may occur with  improper specimen collection/handling, submission of specimen other than nasopharyngeal swab, presence of viral mutation(s) within the areas targeted by this assay, and inadequate number of viral copies(<138 copies/mL). A negative result must be combined with clinical observations, patient history, and epidemiological information. The expected result is Negative.  Fact Sheet for Patients:  EntrepreneurPulse.com.au  Fact Sheet for Healthcare Providers:  IncredibleEmployment.be  This test is no t yet approved or cleared by the [place] FDA and  has been authorized for detection and/or diagnosis of SARS-CoV-2 by FDA under an Emergency Use Authorization (EUA). This EUA will remain  in effect (meaning this test can be used) for the duration of the COVID-19 declaration under Section 564(b)(1) of the Act, 21 U.S.C.section 360bbb-3(b)(1), unless the authorization is terminated  or revoked sooner.       Influenza A by PCR NEGATIVE NEGATIVE Final   Influenza B by PCR NEGATIVE NEGATIVE Final    Comment: (NOTE) The Xpert Xpress SARS-CoV-2/FLU/RSV plus assay is intended as an aid in the diagnosis of influenza from Nasopharyngeal swab specimens and should  not be used as a sole basis for treatment. Nasal washings and aspirates are unacceptable for Xpert Xpress SARS-CoV-2/FLU/RSV testing.  Fact Sheet for Patients: EntrepreneurPulse.com.au  Fact Sheet for Healthcare Providers: IncredibleEmployment.be  This test is not yet approved or cleared by the [place] FDA and has been authorized for detection and/or diagnosis of SARS-CoV-2 by FDA under an Emergency Use Authorization (EUA). This EUA will remain in effect (meaning this test can be used) for the duration of the COVID-19 declaration under Section 564(b)(1) of the Act, 21 U.S.C. section 360bbb-3(b)(1), unless the authorization is terminated or revoked.  Performed at [facility], [address] [place], [place] [address]   Blood Culture (routine x 2)     Status: None (Preliminary result)   Collection  Time: 12/07/21  2:58 PM   Specimen: BLOOD RIGHT FOREARM  Result Value Ref Range Status   Specimen Description BLOOD RIGHT FOREARM  Final   Special Requests   Final    BOTTLES DRAWN AEROBIC AND ANAEROBIC Blood Culture adequate volume   Culture   Final    NO GROWTH 3 DAYS Performed at [facility], [address] [place], [place] [address]    Report Status PENDING  Incomplete  Blood Culture (routine x 2)     Status: None (Preliminary result)   Collection Time: 12/07/21  3:03 PM   Specimen: Right Antecubital; Blood  Result Value Ref Range Status   Specimen Description RIGHT ANTECUBITAL  Final   Special Requests   Final    BOTTLES DRAWN AEROBIC AND ANAEROBIC Blood Culture results may not be optimal due to an inadequate volume of blood received in culture bottles   Culture   Final    NO GROWTH 3 DAYS Performed at [facility], [address], [place], [place] [address]    Report Status PENDING  Incomplete  MRSA Next Gen by PCR, Nasal     Status: None   Collection Time: 12/10/21  5:50 PM   Specimen: Nasal Mucosa; Nasal Swab  Result Value Ref  Range Status   MRSA by PCR Next Gen NOT DETECTED NOT DETECTED Final    Comment: (NOTE) The GeneXpert MRSA Assay (FDA approved for NASAL specimens only), is one component of a comprehensive MRSA colonization surveillance program. It is not intended to diagnose MRSA infection nor to guide or monitor treatment for MRSA infections. Test performance is not FDA approved in patients less than 17 years old. Performed at [facility], [address] [place], [place] [address]      Radiology Studies: [facility] Chest [facility] 1 View  Result Date: 12/11/2021 CLINICAL DATA:  8366 with acute respiratory failure. EXAM: PORTABLE CHEST 1 VIEW COMPARISON:  12/08/2021 portable chest and chest CT. FINDINGS: 4:49 a.m. There is cardiomegaly again noted and mild central vascular prominence, with aortic atherosclerosis. Stable mediastinum. Interstitial and patchy alveolar opacities in the mid to lower lung fields continue to be seen, with a basal gradient. There are minimal pleural effusions. There is improvement in opacities in the base of both lungs but significant opacity remains and is otherwise unaltered. The upper 1/3 of the lungs remain generally clear. There is osteopenia and degenerative change thoracic spine, chronic healed fracture deformity proximal left humerus. IMPRESSION: Improved aeration at the base of both lungs. No other noteworthy change in the bilateral lung opacities and underlying interstitial prominence. Cardiomegaly. Electronically Signed   By: Telford Nab M.D.   On: 12/11/2021 06:29    Scheduled Meds:  apixaban  10 mg Oral BID   Followed by   Derrill Memo ON 12/15/2021] apixaban  5 mg Oral BID   aspirin  81 mg Oral Daily   atorvastatin  80 mg Oral Daily   Chlorhexidine Gluconate Cloth  6 each Topical Q0600   docusate sodium  100 mg Oral Daily   feeding supplement (GLUCERNA SHAKE)  237 mL Oral TID BM   ferrous sulfate  325 mg Oral Daily   gabapentin  100 mg Oral TID   guaiFENesin  600 mg Oral BID    hydrALAZINE  50 mg Oral Q8H   insulin aspart  0-15 Units Subcutaneous TID WC   insulin aspart  18 Units Subcutaneous TID WC   insulin glargine-yfgn  55 Units Subcutaneous Daily   ipratropium-albuterol  3 mL  Nebulization Q6H   losartan  100 mg Oral Daily   methylPREDNISolone (SOLU-MEDROL) injection  80 mg Intravenous Q8H   metoprolol tartrate  50 mg Oral BID   mometasone-formoterol  2 puff Inhalation BID   montelukast  10 mg Oral QHS   pantoprazole  40 mg Oral Daily   polyvinyl alcohol  1 drop Both Eyes QID   umeclidinium bromide  1 puff Inhalation Daily   Continuous Infusions:  azithromycin Stopped (12/10/21 1648)   cefTRIAXone (ROCEPHIN)  IV Stopped (12/10/21 1536)   lactated ringers Stopped (12/09/21 0923)   vancomycin       LOS: 4 days   Critical Care Procedure Note Authorized and Performed by: Murvin Natal MD  Total Critical Care time:  46 mins Due to a high probability of clinically significant, life threatening deterioration, the patient required my highest level of preparedness to intervene emergently and I personally spent this critical care time directly and personally managing the patient.  This critical care time included obtaining a history; examining the patient, pulse oximetry; ordering and review of studies; arranging urgent treatment with development of a management plan; evaluation of patient's response of treatment; frequent reassessment; and discussions with other providers.  This critical care time was performed to assess and manage the high probability of imminent and life threatening deterioration that could result in multi-organ failure.  It was exclusive of separately billable procedures and treating other patients and teaching time.    Irwin Brakeman, MD How to contact the [facility] Attending or Consulting provider [employer] or covering provider during after hours [place], for this patient?  Check the care team in [facility] and look for a) attending/consulting TRH provider  listed and b) the [facility] team listed Log into [email or web address] and use [place]'s universal password to access. If you do not have the password, please contact the hospital operator. Locate the [facility] provider you are looking for under [facility] and page to a number that you can be directly reached. If you still have difficulty reaching the provider, please page the [facility] (Director on Call) for the Hospitalists listed on amion for assistance.  12/11/2021, 9:12 AM

## 2021-12-12 DIAGNOSIS — I10 Essential (primary) hypertension: Secondary | ICD-10-CM | POA: Diagnosis not present

## 2021-12-12 DIAGNOSIS — J9621 Acute and chronic respiratory failure with hypoxia: Secondary | ICD-10-CM | POA: Diagnosis not present

## 2021-12-12 DIAGNOSIS — J441 Chronic obstructive pulmonary disease with (acute) exacerbation: Secondary | ICD-10-CM | POA: Diagnosis not present

## 2021-12-12 DIAGNOSIS — J189 Pneumonia, unspecified organism: Secondary | ICD-10-CM | POA: Diagnosis not present

## 2021-12-12 DIAGNOSIS — R1319 Other dysphagia: Secondary | ICD-10-CM | POA: Diagnosis not present

## 2021-12-12 LAB — CULTURE, BLOOD (ROUTINE X 2)
Culture: NO GROWTH
Culture: NO GROWTH
Special Requests: ADEQUATE

## 2021-12-12 LAB — GLUCOSE, CAPILLARY
Glucose-Capillary: 202 mg/dL — ABNORMAL HIGH (ref 70–99)
Glucose-Capillary: 211 mg/dL — ABNORMAL HIGH (ref 70–99)
Glucose-Capillary: 221 mg/dL — ABNORMAL HIGH (ref 70–99)
Glucose-Capillary: 270 mg/dL — ABNORMAL HIGH (ref 70–99)
Glucose-Capillary: 238 mg/dL — ABNORMAL HIGH (ref 70–99)

## 2021-12-12 LAB — CBC
HCT: 31.5 % — ABNORMAL LOW (ref 36.0–46.0)
Hemoglobin: 9.4 g/dL — ABNORMAL LOW (ref 12.0–15.0)
MCH: 24.7 pg — ABNORMAL LOW (ref 26.0–34.0)
MCHC: 29.8 g/dL — ABNORMAL LOW (ref 30.0–36.0)
MCV: 82.9 fL (ref 80.0–100.0)
Platelets: 177 10*3/uL (ref 150–400)
RBC: 3.8 MIL/uL — ABNORMAL LOW (ref 3.87–5.11)
RDW: 26.9 % — ABNORMAL HIGH (ref 11.5–15.5)
WBC: 14.1 10*3/uL — ABNORMAL HIGH (ref 4.0–10.5)
nRBC: 0 % (ref 0.0–0.2)

## 2021-12-12 MED ORDER — FUROSEMIDE 10 MG/ML IJ SOLN
40.0000 mg | Freq: Once | INTRAMUSCULAR | Status: AC
  Administered 2021-12-12: 40 mg via INTRAVENOUS
  Filled 2021-12-12: qty 4

## 2021-12-12 NOTE — Progress Notes (Signed)
NAME:  Michaela Moon, MRN:  [id number], DOB:  1935-05-10, LOS: 5 ADMISSION DATE:  12/07/2021, CONSULTATION DATE:  12/12/2021  REFERRING MD:  Wynetta Emery, TRH, CHIEF COMPLAINT:  resp failure on HF [place]   History of Present Illness:  86 year old ex-smoker with COPD and chronic respiratory failure on 2 L of oxygen who was admitted for worsening hypoxia requiring up to 5 L of oxygen at home. Her oxygen requirements have increased starting on 15 L high flow in the emergency room and now up to 100% on 30 L heated high flow nasal cannula. Initial labs were significant for WBC count of 15.3, BNP 116, COVID testing was negative Chest x-ray showed partial clearing of bibasilar airspace disease.  Of note, she was hospitalized 9/2 to 9/12 and treated for COVID-19 pneumonia With Paxlovid, Decadron and empiric antibiotic discharge on 2 L She was again hospitalized 10/4 to 10/10 with multifocal pneumonia and treated with Rocephin and azithromycin, weaned down to 5 L nasal cannula  Pertinent  Medical History  hypertension,  T2DM,  dysphagia requiring dilation 04/2016  Significant Hospital Events: Including procedures, antibiotic start and stop dates in addition to other pertinent events   CT angiogram chest 12/08/2021 multilobar airspace disease with similar pattern compared to January 2023.  Lingular opacity appears chronic, lower lobe airspace disease is worse, right upper lobe spiculated nodule unchanged from prior  10/17 cleared bedside swallow evaluation   Interim History / Subjective:   Continues to improve On heated high flow, 50% / 25 L Good urine output   Objective   Blood pressure (!) 157/88, pulse 70, temperature 98.1 F (36.7 C), temperature source Oral, resp. rate 20, height '5\' 5"'$  (1.651 m), weight 82.3 kg, SpO2 94 %.    FiO2 (%):  [50 %-60 %] 50 %   Intake/Output Summary (Last 24 hours) at 12/12/2021 1543 Last data filed at 12/12/2021 1000 Gross per 24 hour  Intake 0 ml  Output  1050 ml  Net -1050 ml    Filed Weights   12/10/21 0434 12/11/21 0500 12/12/21 0500  Weight: 80.7 kg 81.9 kg 82.3 kg    Examination: General: Elderly woman, sitting up in bed, no distress HENT: Mild pallor, no icterus, no JVD Lungs: Scattered bibasilar crackles, no accessory muscle use Cardiovascular: S1-S2 regular, no murmur Abdomen: Soft, obese, nontender, no hepatosplenomegaly Extremities: No significant edema, no deformity Neuro: Alert and interactive, nonfocal  Chest x-ray 10/17 independently reviewed shows mild improvement of interstitial infiltrates especially at the bases  Labs show stable hemoglobin, increase leukocytosis-likely due to steroids  Resolved Hospital Problem list     Assessment & Plan:  Acute hypoxic respiratory failure Bilateral infiltrates -lingular patchy infiltrate dates back to January 2023 and both lower lobe infiltrates appear gradually worse compared to July 2023 on serial CT imaging  -Differential includes chronic aspiration versus inflammatory etiology, doubt malignancy.  Right upper lobe pulmonary nodule appears stable since January 2023  -Continue ceftriaxone/azithromycin, MRSA PCR negative -Continue IV Solu-Medrol 80 every 8, once FiO2 significantly decreased, will start tapering -Unfortunately she is too hypoxic to perform bronchoscopy safely. -BiPAP as needed only if increased work of breathing    Dysphagia -esophageal dilatation 2018 for?  Web. -Cleared bedside swallow evaluation, plan for MBS once FiO2 requirements decrease then can transition to [place]ter nasal cannula  Uncontrolled type 2 diabetes -due to high-dose steroids -Increased insulin to Lantus 55  units plus NovoLog 18 units 3 times daily with meals -SSI coverage  Bilateral calf DVT -apixaban started  by [facility]  Best Practice (right click and "Reselect all SmartList Selections" daily)   Diet/type: Regular consistency (see orders) DVT prophylaxis: DOAC GI prophylaxis:  N/A Lines: N/A Foley:  N/A Code Status:  full code Last date of multidisciplinary goals of care discussion  10/17, DNR issued son updated at bedside daily  Labs   CBC: Recent Labs  Lab 12/07/21 1458 12/08/21 0344 12/09/21 0343 12/10/21 0418 12/11/21 0308 12/12/21 0324  WBC 15.3* 9.4 12.1* 11.4* 11.9* 14.1*  NEUTROABS 10.6*  --   --   --  10.1*  --   HGB 9.8* 8.6* 8.5* 8.7* 9.3* 9.4*  HCT 32.8* 29.0* 28.8* 29.3* 31.3* 31.5*  MCV 82.2 81.2 82.1 81.4 83.2 82.9  PLT 208 182 170 166 168 177     Basic Metabolic Panel: Recent Labs  Lab 12/07/21 1458 12/08/21 0344 12/09/21 0343 12/10/21 0418 12/11/21 0308  NA 139 138 138 137 137  K 3.7 4.5 4.9 4.8 4.9  CL 107 106 104 104 104  CO2 21* '25 28 27 25  '$ GLUCOSE 88 292* 297* 196* 245*  BUN '11 12 15 16 19  '$ CREATININE 0.77 0.68 0.70 0.60 0.55  CALCIUM 10.5* 10.1 10.1 10.1 10.3  MG  --  1.5* 1.8 2.0 1.7  PHOS  --  3.9  --   --  2.8    GFR: Estimated Creatinine Clearance: 55.5 mL/min (by C-G formula based on SCr of 0.55 mg/dL). Recent Labs  Lab 12/07/21 1458 12/07/21 1642 12/08/21 0344 12/09/21 0343 12/10/21 0418 12/11/21 0308 12/12/21 0324  PROCALCITON  --   --  <0.10  --   --   --   --   WBC 15.3*  --  9.4 12.1* 11.4* 11.9* 14.1*  LATICACIDVEN 2.8* 2.3*  --   --   --   --   --      Liver Function Tests: Recent Labs  Lab 12/07/21 1458 12/08/21 0344  AST 32 23  ALT 34 26  ALKPHOS 52 45  BILITOT 0.4 0.5  PROT 7.2 6.4*  ALBUMIN 3.3* 2.8*    No results for input(s): "LIPASE", "AMYLASE" in the last 168 hours. No results for input(s): "AMMONIA" in the last 168 hours.  ABG    Component Value Date/Time   PHART 7.44 12/07/2021 1518   PCO2ART 37 12/07/2021 1518   PO2ART 74 (L) 12/07/2021 1518   HCO3 25.1 12/07/2021 1518   O2SAT 95.6 12/07/2021 1518     Coagulation Profile: Recent Labs  Lab 12/07/21 1458  INR 1.1     Cardiac Enzymes: No results for input(s): "CKTOTAL", "CKMB", "CKMBINDEX",  "TROPONINI" in the last 168 hours.  HbA1C: Hgb A1c MFr Bld  Date/Time Value Ref Range Status  12/08/2021 04:46 AM 6.3 (H) 4.8 - 5.6 % Final    Comment:    (NOTE) Pre diabetes:          5.7%-6.4%  Diabetes:              >6.4%  Glycemic control for   <7.0% adults with diabetes   10/21/2017 11:18 AM 7.5 (H) 4.8 - 5.6 % Final    Comment:    (NOTE) Pre diabetes:          5.7%-6.4% Diabetes:              >6.4% Glycemic control for   <7.0% adults with diabetes     CBG: Recent Labs  Lab 12/11/21 1601 12/11/21 2132 12/12/21 0502 12/12/21 0730 12/12/21 1148  GLUCAP  [facility] Pager : [phone]  If no response to pager , please call [phone] until 7 pm After 7:00 pm call Elink  [phone]   12/12/2021

## 2021-12-12 NOTE — Progress Notes (Signed)
Patient is coughing up red tinged and white, frothy sputum.  Took patient down to 20L at 50%, attempting to wean down.  Will continue to monitor.

## 2021-12-12 NOTE — Progress Notes (Signed)
PROGRESS NOTE  Michaela Moon ALP:[id number] DOB: [birth year] DOA: 12/07/2021 PCP: [name], MD  Brief History:  86 y.o. female with medical history significant of hypertension, T2DM, COPD, dysphagia requiring dilation who presents to the emergency department due to shortness of breath. She was recently admitted from 10/4 to 10/10 due to acute on chronic hypoxemic respiratory failure secondary to multifocal pneumonia which was treated with IV Rocephin and azithromycin.  She completed a course of antibiotics while still in the hospital and O2 sat was weaned down to 4 to 5 L via [place].  It appeared that patient was on supplemental oxygen at 2 LPM on returning home after discharge.  Home oxygen was being monitored at home and when the home health nurse went to see patient yesterday, she was slightly hypoxic, home oxygen was monitored within the past 24 hours and was noted to have dropped down into the low 80s and the oxygen was increased to 5 L/min via [place], despite this, patient was still hypoxic with an O2 sat of 82% on arrival to the ED Patient was also recently admitted to [facility] from 9/2 to 9/12 due to multifocal pneumonia and acute respiratory failure with hypoxia due to COVID-19 during which she was transferred to ICU and she required BiPAP, she was treated with Paxlovid for 5 days and she continued Decadron and empiric antibiotic.  She was discharged with supplemental oxygen via [place] at 2 LPM   At bedside, patient was requiring supplemental oxygen via [facility] at 15 L, she was not in any acute distress.  There was concern regarding food getting stuck in her throat/chest.   ED Course:  In the emergency department, she was intermittently tachypneic, BP was 131/57, temperature 97.92F, pulse 100 bpm, O2 sat 95% on NRB.  Work-up in the ED showed WBC 15.3, hemoglobin 8.8, hematocrit 32.8, MCV 82.2, platelets 208.  BMP showed sodium 139, potassium 3.7, chloride 107, bicarb 24, glucose 88, BUN  11, creatinine 0.77.  Lactic acid 2.8 > 2.3, BNP 116 (this was 329 about 9 days ago).  Influenza A, B, SARS coronavirus 2 was negative.  Blood culture was pending.  Chest x-ray showed Interval partial clearing of bibasilar airspace opacities, most consistent with improving pneumonia. Solu-Medrol 25 mg x 1 was given, she was started on IV ceftriaxone and azithromycin, IV hydration was provided.  Hospitalist was asked to admit patient for further evaluation and management.   Assessment/Plan: Acute on chronic respiratory failure with hypoxia O2 sat dropped to 80s on supplemental oxygen via  at 5 LPM and required up to [facility] at 30 L/min at 100% to maintain sats Goal pulse ox is >87% no need for pulse ox to be 100%  On 2L at home Remains on 25L at 50% FiO2 Lasix 40 mg IV x 1 on 10/18  Acute exacerbation of COPD CTA chest--negative for PE but reports concern for atypical/noninfectious pneumonia--multifocal consolidation in all lobes Appreciate pulmonology recommendations.  Continue duo nebs, Mucinex, Solu-Medrol, Singulair  Finished 5 days ceftriaxone/azithromycin. Continue Protonix to prevent steroid-induced ulcer Continue incentive spirometry and flutter valve 12/08/21 TTE: LVEF 60-65% , no WMA, G1 DD, normal RVF    Lobar pneumonia - appears to be resolving on CXR.   Chest CTA with concern for noninfectious/atypical pneumonia  Consulted pulmonology on 10/16.  Procalcitonin <0.10 Patient was started on ceftriaxone and azithromycin Follow up blood culture, sputum culture, urine Legionella, strep pneumo and procalcitonin Continue Tylenol  as needed Continue Mucinex, incentive spirometry, flutter valve  Pulmonary consultation 12/10/21, appreciate recommendations.    Bilateral Calf DVT Confirmed by venous US 10/14 TED  [facility] Apixaban ordered dosed per pharm D  PE was ruled out by CTA    Lactic acidosis  -from hypoxia Lactic acid 2.8 > 2.3 trended down   Hypoalbuminemia probably  secondary to mild protein calorie malnutrition Albumin 3.3, protein supplement will be provided   Dysphagia Last EGD on 04/2016 by Dr. Oneida Alar  showed no endoscopic esophageal abnormality to explain patient's dysphagia. Esophagus dilated per medical record Seen by speech--dys 3 with thin   T2DM with steroid induced hyperglycemia  12/08/21 A1C--6.3 Continue ISS and hypoglycemia protocol Semglee 55 units plus novolog 18 units TID with meals, plus supplemental coverage and frequent CBG monitoring   Iron deficiency anemia Continue ferrous sulfate   Essential hypertension Continue losartan, Lopressor, add hydralazine    Mixed hyperlipidemia Continue Lipitor   GERD Continue Protonix   Peripheral neuropathy Continue Neurontin      Family Communication:  son at bedside 10/19  Consultants:  pulm  Code Status:  DNR  DVT Prophylaxis:  apixaban   Procedures: As Listed in Progress Note Above  Antibiotics: Ceftriaxone 10/13>>10/17 Azithro 10/13>>10/17    Subjective: Patient denies fevers, chills, headache, chest pain, nausea, vomiting, diarrhea, abdominal pain, dysuria, hematuria, hematochezia, and melena.   Objective: Vitals:   12/12/21 0800 12/12/21 0900 12/12/21 1000 12/12/21 1001  BP: (!) 177/58 (!) 172/42 (!) 151/56 (!) 151/56  Pulse: (!) 101 94 81 82  Resp: '20 15 16 18  '$ Temp:      TempSrc:      SpO2: 94% 95% 95% 93%  Weight:      Height:        Intake/Output Summary (Last 24 hours) at 12/12/2021 1350 Last data filed at 12/12/2021 1000 Gross per 24 hour  Intake 0 ml  Output 1250 ml  Net -1250 ml   Weight change: 0.4 kg Exam:  General:  Pt is alert, follows commands appropriately, not in acute distress HEENT: No icterus, No thrush, No neck mass, [place]/AT Cardiovascular: RRR, S1/S2, no rubs, no gallops Respiratory: bilateral rhonchi Abdomen: Soft/+BS, non tender, non distended, no guarding Extremities: No edema, No lymphangitis, No petechiae, No  rashes, no synovitis   Data Reviewed: I have personally reviewed following labs and imaging studies Basic Metabolic Panel: Recent Labs  Lab 12/07/21 1458 12/08/21 0344 12/09/21 0343 12/10/21 0418 12/11/21 0308  NA 139 138 138 137 137  K 3.7 4.5 4.9 4.8 4.9  CL 107 106 104 104 104  CO2 21* '25 28 27 25  '$ GLUCOSE 88 292* 297* 196* 245*  BUN '11 12 15 16 19  '$ CREATININE 0.77 0.68 0.70 0.60 0.55  CALCIUM 10.5* 10.1 10.1 10.1 10.3  MG  --  1.5* 1.8 2.0 1.7  PHOS  --  3.9  --   --  2.8   Liver Function Tests: Recent Labs  Lab 12/07/21 1458 12/08/21 0344  AST 32 23  ALT 34 26  ALKPHOS 52 45  BILITOT 0.4 0.5  PROT 7.2 6.4*  ALBUMIN 3.3* 2.8*   No results for input(s): "LIPASE", "AMYLASE" in the last 168 hours. No results for input(s): "AMMONIA" in the last 168 hours. Coagulation Profile: Recent Labs  Lab 12/07/21 1458  INR 1.1   CBC: Recent Labs  Lab 12/07/21 1458 12/08/21 0344 12/09/21 0343 12/10/21 0418 12/11/21 0308 12/12/21 0324  WBC 15.3* 9.4 12.1* 11.4* 11.9* 14.1*  NEUTROABS 10.6*  --   --   --  10.1*  --   HGB 9.8* 8.6* 8.5* 8.7* 9.3* 9.4*  HCT 32.8* 29.0* 28.8* 29.3* 31.3* 31.5*  MCV 82.2 81.2 82.1 81.4 83.2 82.9  PLT 208 182 170 166 168 177   Cardiac Enzymes: No results for input(s): "CKTOTAL", "CKMB", "CKMBINDEX", "TROPONINI" in the last 168 hours. BNP: Invalid input(s): "POCBNP" CBG: Recent Labs  Lab 12/11/21 1601 12/11/21 2132 12/12/21 0502 12/12/21 0730 12/12/21 1148  GLUCAP 218* 222* 202* 211* 221*   HbA1C: No results for input(s): "HGBA1C" in the last 72 hours. Urine analysis: No results found for: "COLORURINE", "APPEARANCEUR", "LABSPEC", "PHURINE", "GLUCOSEU", "HGBUR", "BILIRUBINUR", "KETONESUR", "PROTEINUR", "UROBILINOGEN", "NITRITE", "LEUKOCYTESUR" Sepsis Labs: '@LABRCNTIP'$ (procalcitonin:4,lacticidven:4) ) Recent Results (from the past 240 hour(s))  Resp Panel by RT-PCR (Flu A&B, Covid) Anterior Nasal Swab     Status: None    Collection Time: 12/07/21  2:58 PM   Specimen: Anterior Nasal Swab  Result Value Ref Range Status   SARS Coronavirus 2 by RT PCR NEGATIVE NEGATIVE Final    Comment: (NOTE) SARS-CoV-2 target nucleic acids are NOT DETECTED.  The SARS-CoV-2 RNA is generally detectable in upper respiratory specimens during the acute phase of infection. The lowest concentration of SARS-CoV-2 viral copies this assay can detect is 138 copies/mL. A negative result does not preclude SARS-Cov-2 infection and should not be used as the sole basis for treatment or other patient management decisions. A negative result may occur with  improper specimen collection/handling, submission of specimen other than nasopharyngeal swab, presence of viral mutation(s) within the areas targeted by this assay, and inadequate number of viral copies(<138 copies/mL). A negative result must be combined with clinical observations, patient history, and epidemiological information. The expected result is Negative.  Fact Sheet for Patients:  EntrepreneurPulse.com.au  Fact Sheet for Healthcare Providers:  IncredibleEmployment.be  This test is no t yet approved or cleared by the [place] FDA and  has been authorized for detection and/or diagnosis of SARS-CoV-2 by FDA under an Emergency Use Authorization (EUA). This EUA will remain  in effect (meaning this test can be used) for the duration of the COVID-19 declaration under Section 564(b)(1) of the Act, 21 U.S.C.section 360bbb-3(b)(1), unless the authorization is terminated  or revoked sooner.       Influenza A by PCR NEGATIVE NEGATIVE Final   Influenza B by PCR NEGATIVE NEGATIVE Final    Comment: (NOTE) The Xpert Xpress SARS-CoV-2/FLU/RSV plus assay is intended as an aid in the diagnosis of influenza from Nasopharyngeal swab specimens and should not be used as a sole basis for treatment. Nasal washings and aspirates are unacceptable for  Xpert Xpress SARS-CoV-2/FLU/RSV testing.  Fact Sheet for Patients: EntrepreneurPulse.com.au  Fact Sheet for Healthcare Providers: IncredibleEmployment.be  This test is not yet approved or cleared by the [place] FDA and has been authorized for detection and/or diagnosis of SARS-CoV-2 by FDA under an Emergency Use Authorization (EUA). This EUA will remain in effect (meaning this test can be used) for the duration of the COVID-19 declaration under Section 564(b)(1) of the Act, 21 U.S.C. section 360bbb-3(b)(1), unless the authorization is terminated or revoked.  Performed at [facility], [address] [place], [place] [address]   Blood Culture (routine x 2)     Status: None   Collection Time: 12/07/21  2:58 PM   Specimen: BLOOD RIGHT FOREARM  Result Value Ref Range Status   Specimen Description BLOOD RIGHT FOREARM  Final   Special Requests  Final    BOTTLES DRAWN AEROBIC AND ANAEROBIC Blood Culture adequate volume   Culture   Final    NO GROWTH 5 DAYS Performed at [facility], [address], [place], [place] [address]    Report Status 12/12/2021 FINAL  Final  Blood Culture (routine x 2)     Status: None   Collection Time: 12/07/21  3:03 PM   Specimen: Right Antecubital; Blood  Result Value Ref Range Status   Specimen Description RIGHT ANTECUBITAL  Final   Special Requests   Final    BOTTLES DRAWN AEROBIC AND ANAEROBIC Blood Culture results may not be optimal due to an inadequate volume of blood received in culture bottles   Culture   Final    NO GROWTH 5 DAYS Performed at [facility], [address], [place], [place] [address]    Report Status 12/12/2021 FINAL  Final  MRSA Next Gen by PCR, Nasal     Status: None   Collection Time: 12/10/21  5:50 PM   Specimen: Nasal Mucosa; Nasal Swab  Result Value Ref Range Status   MRSA by PCR Next Gen NOT DETECTED NOT DETECTED Final    Comment: (NOTE) The GeneXpert MRSA Assay (FDA approved  for NASAL specimens only), is one component of a comprehensive MRSA colonization surveillance program. It is not intended to diagnose MRSA infection nor to guide or monitor treatment for MRSA infections. Test performance is not FDA approved in patients less than 61 years old. Performed at [facility], [address] [place], [place] [address]      Scheduled Meds:  apixaban  10 mg Oral BID   Followed by   Derrill Memo ON 12/15/2021] apixaban  5 mg Oral BID   aspirin  81 mg Oral Daily   atorvastatin  80 mg Oral Daily   Chlorhexidine Gluconate Cloth  6 each Topical Q0600   docusate sodium  100 mg Oral Daily   feeding supplement (GLUCERNA SHAKE)  237 mL Oral TID BM   ferrous sulfate  325 mg Oral Daily   furosemide  40 mg Intravenous Once   gabapentin  100 mg Oral TID   hydrALAZINE  50 mg Oral Q8H   insulin aspart  0-15 Units Subcutaneous TID WC   insulin aspart  18 Units Subcutaneous TID WC   insulin glargine-yfgn  55 Units Subcutaneous Daily   ipratropium-albuterol  3 mL Nebulization Q6H   losartan  100 mg Oral Daily   methylPREDNISolone (SOLU-MEDROL) injection  80 mg Intravenous Q8H   metoprolol tartrate  50 mg Oral BID   mometasone-formoterol  2 puff Inhalation BID   montelukast  10 mg Oral QHS   pantoprazole  40 mg Oral Daily   polyvinyl alcohol  1 drop Both Eyes QID   umeclidinium bromide  1 puff Inhalation Daily   Continuous Infusions:  lactated ringers Stopped (12/09/21 0923)    Procedures/Studies: DG Chest Port 1 View  Result Date: 12/11/2021 CLINICAL DATA:  [age] with acute respiratory failure. EXAM: PORTABLE CHEST 1 VIEW COMPARISON:  12/08/2021 portable chest and chest CT. FINDINGS: 4:49 a.m. There is cardiomegaly again noted and mild central vascular prominence, with aortic atherosclerosis. Stable mediastinum. Interstitial and patchy alveolar opacities in the mid to lower lung fields continue to be seen, with a basal gradient. There are minimal pleural effusions. There is  improvement in opacities in the base of both lungs but significant opacity remains and is otherwise unaltered. The upper 1/3 of the lungs remain generally clear. There is osteopenia  and degenerative change thoracic spine, chronic healed fracture deformity proximal left humerus. IMPRESSION: Improved aeration at the base of both lungs. No other noteworthy change in the bilateral lung opacities and underlying interstitial prominence. Cardiomegaly. Electronically Signed   By: Telford Nab M.D.   On: 12/11/2021 06:29   DG CHEST PORT 1 VIEW  Result Date: 12/08/2021 CLINICAL DATA:  Respiratory failure EXAM: PORTABLE CHEST 1 VIEW COMPARISON:  12/07/2021 FINDINGS: No significant change in AP portable chest radiograph. Cardiomegaly with bilateral interstitial and heterogeneous airspace opacity of the lung bases, and probable small layering pleural effusions. Partially imaged chronic fracture deformity of the proximal left humerus. IMPRESSION: No significant change in AP portable chest radiograph. Cardiomegaly with bilateral interstitial and heterogeneous airspace opacity of the lung bases, and probable small layering pleural effusions. Electronically Signed   By: Delanna Ahmadi M.D.   On: 12/08/2021 16:16   ECHOCARDIOGRAM COMPLETE  Result Date: 12/08/2021    ECHOCARDIOGRAM REPORT   Patient Name:   Michaela Moon Date of Exam: 12/08/2021 Medical Rec #:  [id number]       Height:       65.0 in Accession #:    [id number]      Weight:       178.8 lb Date of Birth:  05-03-1935        BSA:          1.886 m Patient Age:    52 years        BP:           160/42 mmHg Patient Gender: F               HR:           68 bpm. Exam Location:  [facility] Procedure: 2D Echo, Cardiac Doppler and Color Doppler Indications:    CHF  History:        Patient has no prior history of Echocardiogram examinations.                 COPD; Risk Factors:Hypertension, Diabetes and Dyslipidemia.  Sonographer:    Wenda Low Referring Phys: [id number]  OLADAPO ADEFESO IMPRESSIONS  1. Left ventricular ejection fraction, by estimation, is 60 to 65%. The left ventricle has normal function. The left ventricle has no regional wall motion abnormalities. The left ventricular internal cavity size was mildly dilated. There is mild left ventricular hypertrophy of the septal segment. Left ventricular diastolic parameters are consistent with Grade I diastolic dysfunction (impaired relaxation).  2. Right ventricular systolic function is normal. The right ventricular size is normal. There is normal pulmonary artery systolic pressure.  3. The mitral valve is grossly normal. No evidence of mitral valve regurgitation.  4. The aortic valve is tricuspid. Aortic valve regurgitation is not visualized. No aortic stenosis is present.  5. The inferior vena cava is normal in size with greater than 50% respiratory variability, suggesting right atrial pressure of 3 mmHg. Comparison(s): No prior Echocardiogram. FINDINGS  Left Ventricle: Left ventricular ejection fraction, by estimation, is 60 to 65%. The left ventricle has normal function. The left ventricle has no regional wall motion abnormalities. The left ventricular internal cavity size was mildly dilated. There is  mild left ventricular hypertrophy of the septal segment. Left ventricular diastolic parameters are consistent with Grade I diastolic dysfunction (impaired relaxation). Right Ventricle: The right ventricular size is normal. No increase in right ventricular wall thickness. Right ventricular systolic function is normal. There is normal pulmonary artery systolic pressure. The tricuspid  regurgitant velocity is 2.06 m/s, and  with an assumed right atrial pressure of 3 mmHg, the estimated right ventricular systolic pressure is 16.0 mmHg. Left Atrium: Left atrial size was normal in size. Right Atrium: Right atrial size was normal in size. Pericardium: There is no evidence of pericardial effusion. Mitral Valve: The mitral valve is  grossly normal. Mild mitral annular calcification. No evidence of mitral valve regurgitation. MV peak gradient, 4.9 mmHg. The mean mitral valve gradient is 2.0 mmHg. Tricuspid Valve: The tricuspid valve is normal in structure. Tricuspid valve regurgitation is not demonstrated. No evidence of tricuspid stenosis. Aortic Valve: The aortic valve is tricuspid. There is mild aortic valve annular calcification. Aortic valve regurgitation is not visualized. No aortic stenosis is present. Aortic valve mean gradient measures 6.0 mmHg. Aortic valve peak gradient measures 12.1 mmHg. Aortic valve area, by VTI measures 2.03 cm. Pulmonic Valve: The pulmonic valve was normal in structure. Pulmonic valve regurgitation is not visualized. No evidence of pulmonic stenosis. Aorta: The aortic root is normal in size and structure. Venous: The inferior vena cava is normal in size with greater than 50% respiratory variability, suggesting right atrial pressure of 3 mmHg. IAS/Shunts: No atrial level shunt detected by color flow Doppler.  LEFT VENTRICLE PLAX 2D LVIDd:         5.30 cm   Diastology LVIDs:         3.20 cm   LV e' medial:    10.00 cm/s LV PW:         1.20 cm   LV E/e' medial:  7.4 LV IVS:        1.00 cm   LV e' lateral:   8.05 cm/s LVOT diam:     1.90 cm   LV E/e' lateral: 9.2 LV SV:         77 LV SV Index:   41 LVOT Area:     2.84 cm  RIGHT VENTRICLE RV Basal diam:  3.30 cm RV Mid diam:    2.40 cm RV S prime:     15.70 cm/s TAPSE (M-mode): 3.0 cm LEFT ATRIUM             Index        RIGHT ATRIUM           Index LA diam:        4.00 cm 2.12 cm/m   RA Area:     14.80 cm LA Vol (A2C):   52.9 ml 28.05 ml/m  RA Volume:   38.50 ml  20.41 ml/m LA Vol (A4C):   54.0 ml 28.63 ml/m LA Biplane Vol: 55.5 ml 29.43 ml/m  AORTIC VALVE                     PULMONIC VALVE AV Area (Vmax):    1.66 cm      PV Vmax:       1.05 m/s AV Area (Vmean):   1.69 cm      PV Peak grad:  4.4 mmHg AV Area (VTI):     2.03 cm AV Vmax:           174.00  cm/s AV Vmean:          118.000 cm/s AV VTI:            0.380 m AV Peak Grad:      12.1 mmHg AV Mean Grad:      6.0 mmHg LVOT Vmax:  102.00 cm/s LVOT Vmean:        70.400 cm/s LVOT VTI:          0.272 m LVOT/AV VTI ratio: 0.72  AORTA Ao Root diam: 3.20 cm MITRAL VALVE                TRICUSPID VALVE MV Area (PHT): 2.92 cm     TR Peak grad:   17.0 mmHg MV Area VTI:   2.38 cm     TR Vmax:        206.00 cm/s MV Peak grad:  4.9 mmHg MV Mean grad:  2.0 mmHg     SHUNTS MV Vmax:       1.11 m/s     Systemic VTI:  0.27 m MV Vmean:      63.0 cm/s    Systemic Diam: 1.90 cm MV Decel Time: 260 msec MV E velocity: 73.70 cm/s MV A velocity: 104.00 cm/s MV E/A ratio:  0.71 Rudean Haskell MD Electronically signed by Rudean Haskell MD Signature Date/Time: 12/08/2021/2:14:12 PM    Final    CT Angio Chest Pulmonary Embolism (PE) W or WO Contrast  Result Date: 12/08/2021 CLINICAL DATA:  Positive D-dimer.  DVT EXAM: CT ANGIOGRAPHY CHEST WITH CONTRAST TECHNIQUE: Multidetector CT imaging of the chest was performed using the standard protocol during bolus administration of intravenous contrast. Multiplanar CT image reconstructions and MIPs were obtained to evaluate the vascular anatomy. RADIATION DOSE REDUCTION: This exam was performed according to the departmental dose-optimization program which includes automated exposure control, adjustment of the mA and/or kV according to patient size and/or use of iterative reconstruction technique. CONTRAST:  15m OMNIPAQUE IOHEXOL 350 MG/ML SOLN COMPARISON:  11/28/2021 FINDINGS: Cardiovascular: Satisfactory opacification of the pulmonary arteries to the segmental level. No evidence of pulmonary embolism. Normal heart size. No pericardial effusion. Extensive atheromatous calcification of the aorta and coronaries Mediastinum/Nodes: Negative for adenopathy. Lungs/Pleura: Unchanged pattern of multifocal consolidation affecting all lobes with ground-glass, nodular, and  consolidative opacities. Lower lobe airspace opacity is new/progressed from July 2023 but remaining opacity is very similar. Central airways are clear. Mild emphysematous change. In an otherwise clear area of lung there is an 8 mm irregular pulmonary nodule which is known from prior CTs. Upper Abdomen: Atheromatous plaque. Dystrophic type calcification in the right posterior liver. Granulomatous calcifications in the spleen. Musculoskeletal: No acute finding. Generalized thoracic spine degeneration. Review of the MIP images confirms the above findings. IMPRESSION: 1. Negative for pulmonary embolism. 2. Multi lobar airspace disease much of which is chronic when compared to a July 2023 CT. Lingular opacity is definitely chronic when compared to a January 2023 CT, and progressive. Consider atypical and non infectious pneumonias. Consider alveolar tumor. 3. Known spiculated nodule in the right upper lobe. Electronically Signed   By: JJorje GuildM.D.   On: 12/08/2021 12:02   [place] Img Lower Bilateral (DVT)  Result Date: 12/08/2021 CLINICAL DATA:  Bilateral lower extremity edema. EXAM: BILATERAL LOWER EXTREMITY VENOUS DOPPLER ULTRASOUND TECHNIQUE: Gray-scale sonography with graded compression, as well as color Doppler and duplex ultrasound were performed to evaluate the lower extremity deep venous systems from the level of the common femoral vein and including the common femoral, femoral, profunda femoral, popliteal and calf veins including the posterior tibial, peroneal and gastrocnemius veins when visible. The superficial great saphenous vein was also interrogated. Spectral Doppler was utilized to evaluate flow at rest and with distal augmentation maneuvers in the common femoral, femoral and popliteal veins. COMPARISON:  None  Available. FINDINGS: RIGHT LOWER EXTREMITY Common Femoral Vein: No evidence of thrombus. Normal compressibility, respiratory phasicity and response to augmentation. Saphenofemoral  Junction: No evidence of thrombus. Normal compressibility and flow on color Doppler imaging. Profunda Femoral Vein: No evidence of thrombus. Normal compressibility and flow on color Doppler imaging. Femoral Vein: No evidence of thrombus. Normal compressibility, respiratory phasicity and response to augmentation. Popliteal Vein: No evidence of thrombus. Normal compressibility, respiratory phasicity and response to augmentation. Calf Veins: Normal patency of posterior tibial vein. There is some visualized thrombus in the right peroneal vein which appears occlusive in the proximal calf. Gastrocnemius thrombus identified in the right calf. Superficial Great Saphenous Vein: No evidence of thrombus. Normal compressibility. Venous Reflux:  None. Other Findings: No evidence of superficial thrombophlebitis or abnormal fluid collection. LEFT LOWER EXTREMITY Common Femoral Vein: No evidence of thrombus. Normal compressibility, respiratory phasicity and response to augmentation. Saphenofemoral Junction: No evidence of thrombus. Normal compressibility and flow on color Doppler imaging. Profunda Femoral Vein: No evidence of thrombus. Normal compressibility and flow on color Doppler imaging. Femoral Vein: No evidence of thrombus. Normal compressibility, respiratory phasicity and response to augmentation. Popliteal Vein: No evidence of thrombus. Normal compressibility, respiratory phasicity and response to augmentation. Calf Veins: Posterior tibial vein is normally patent. Left peroneal vein DVT visualized. Superficial Great Saphenous Vein: No evidence of thrombus. Normal compressibility. Venous Reflux:  None. Other Findings: No evidence of superficial thrombophlebitis or abnormal fluid collection. IMPRESSION: Positive for bilateral calf DVT in the peroneal veins and right gastrocnemius vein. Electronically Signed   By: Aletta Edouard M.D.   On: 12/08/2021 11:50   DG Chest Port 1 View  Result Date: 12/07/2021 CLINICAL DATA:   Questionable sepsis. Evaluate for abnormality. Hypoxemia. History of asthma/COPD. EXAM: PORTABLE CHEST 1 VIEW COMPARISON:  Radiographs 11/29/2021 and 11/28/2021.  CT 11/28/2021. FINDINGS: 1509 hours. The heart size and mediastinal contours appear stable. Compared with the most recent studies, there is partial clearing of the bibasilar airspace opacities, most consistent with improving pneumonia. No pneumothorax or significant pleural effusion identified. No acute osseous findings are seen. Telemetry leads overlie the chest. There is multilevel thoracic spondylosis. IMPRESSION: Interval partial clearing of bibasilar airspace opacities, most consistent with improving pneumonia. Continued follow-up recommended to document complete clearing. No new findings identified. Electronically Signed   By: Richardean Sale M.D.   On: 12/07/2021 15:27   DG Chest 1 View  Result Date: 11/29/2021 CLINICAL DATA:  Shortness of breath EXAM: CHEST  1 VIEW COMPARISON:  Chest x-ray 11/28/2021. FINDINGS: Cardiomediastinal silhouette is within normal limits. There is increasing patchy airspace disease in the bilateral mid and lower lungs. There is no pleural effusion or pneumothorax. No acute fractures are seen. There is a healed left humeral neck fracture. IMPRESSION: Increasing patchy airspace disease in the bilateral mid and lower lungs, concerning for multifocal pneumonia. Electronically Signed   By: Ronney Asters M.D.   On: 11/29/2021 20:36   CT Angio Chest PE W and/or Wo Contrast  Result Date: 11/28/2021 CLINICAL DATA:  PE suspected.  Low O2 sats. EXAM: CT ANGIOGRAPHY CHEST WITH CONTRAST TECHNIQUE: Multidetector CT imaging of the chest was performed using the standard protocol during bolus administration of intravenous contrast. Multiplanar CT image reconstructions and MIPs were obtained to evaluate the vascular anatomy. RADIATION DOSE REDUCTION: This exam was performed according to the departmental dose-optimization program  which includes automated exposure control, adjustment of the mA and/or kV according to patient size and/or use of iterative reconstruction technique. CONTRAST:  80m OMNIPAQUE IOHEXOL 350 MG/ML SOLN COMPARISON:  CT chest 09/20/2021 FINDINGS: Cardiovascular: Satisfactory opacification of the pulmonary arteries to the segmental level. No evidence of pulmonary embolism. Normal heart size. No pericardial effusion. Calcific aortic atherosclerosis. Multivessel coronary artery calcification. Mediastinum/Nodes: No enlarged mediastinal, hilar, or axillary lymph nodes. Thyroid gland and trachea demonstrate no significant findings. Patulous esophagus. Lungs/Pleura: A 1.0 x 0.8 cm irregular nodule in the anterior right upper lobe appears subsolid on today's exam (series 6, image 57). Irregular consolidation with air bronchograms and surrounding ground-glass opacities dependently located in the left upper lobe is not significantly changed compared to 09/20/2021. Airspace consolidation with air bronchograms dependently located in the right upper and right middle lobes with adjacent ground-glass opacities are increased compared to 09/20/2021. Similarly, patchy consolidations with surrounding ground-glass opacities in the lower lungs are also increased compared to 09/16/2021, particularly in the right lower lobe. No pleural effusion or pneumothorax. Upper Abdomen: No acute abnormality. Benign left hepatic cyst again noted. No follow-up imaging indicated. Musculoskeletal: No chest wall abnormality. No acute or suspicious osseous findings. Multilevel degenerative changes in the thoracic and visualized upper lumbar spine. Review of the MIP images confirms the above findings. IMPRESSION: 1. No pulmonary embolism. 2. Multifocal, dependently located, consolidations bilaterally, favored to represent multifocal pneumonia, possibly aspiration related. Recommend follow-up CT imaging in 3 months after treatment to ensure resolution. 3. The 1  cm nodule in the anterior right upper lobe appears subsolid on today's exam. As previously noted, bronchogenic carcinoma cannot be excluded. Recommend attention on follow-up imaging. 4. Multivessel coronary artery disease. Aortic Atherosclerosis (ICD10-I70.0). Electronically Signed   By: LIleana RoupM.D.   On: 11/28/2021 16:15   DG Chest Port 1 View  Result Date: 11/28/2021 CLINICAL DATA:  Dyspnea, shaking, hypoxia, history COPD, asthma, diabetes mellitus, hypertension EXAM: PORTABLE CHEST 1 VIEW COMPARISON:  Portable exam 1304 hours compared to 10/31/2021 FINDINGS: Upper normal size of cardiac silhouette. Mediastinal contours and pulmonary vascularity normal. Atherosclerotic calcification aorta. Bibasilar infiltrates question multifocal pneumonia. Minimal pleural effusions. No pneumothorax. Bones demineralized with posttraumatic deformity proximal LEFT humerus and multilevel degenerative disc disease changes/scoliosis thoracic spine. IMPRESSION: Bibasilar pulmonary infiltrates question multifocal pneumonia. Minimal pleural effusions. Aortic Atherosclerosis (ICD10-I70.0). Electronically Signed   By: MLavonia DanaM.D.   On: 11/28/2021 13:15    DOrson Eva DO  [facility]  If 7PM-7AM, please contact night-coverage [email or web address] Password [email or web address] 12/12/2021, 1:50 PM   LOS: 5 days

## 2021-12-12 NOTE — Progress Notes (Addendum)
Went in to give 2am treatment and tried to pull covers down to scan patient's armband, patient immediately grabbed covers and pulled back up around shoulders.  Did not disturb patient any further, she is sleeping soundly, will alert daytime RT to do her treatment when coming to floor.  RN made aware.  Titrated flow to 25L, patient at 50% FIO2 with sats maintaining around 96%.

## 2021-12-13 ENCOUNTER — Inpatient Hospital Stay (HOSPITAL_COMMUNITY): Payer: Medicare Other

## 2021-12-13 DIAGNOSIS — J9621 Acute and chronic respiratory failure with hypoxia: Secondary | ICD-10-CM | POA: Diagnosis not present

## 2021-12-13 DIAGNOSIS — Z515 Encounter for palliative care: Secondary | ICD-10-CM | POA: Diagnosis not present

## 2021-12-13 DIAGNOSIS — Z66 Do not resuscitate: Secondary | ICD-10-CM | POA: Diagnosis not present

## 2021-12-13 DIAGNOSIS — J189 Pneumonia, unspecified organism: Secondary | ICD-10-CM | POA: Diagnosis not present

## 2021-12-13 DIAGNOSIS — I10 Essential (primary) hypertension: Secondary | ICD-10-CM | POA: Diagnosis not present

## 2021-12-13 DIAGNOSIS — Z7189 Other specified counseling: Secondary | ICD-10-CM | POA: Diagnosis not present

## 2021-12-13 DIAGNOSIS — J441 Chronic obstructive pulmonary disease with (acute) exacerbation: Secondary | ICD-10-CM | POA: Diagnosis not present

## 2021-12-13 LAB — GLUCOSE, CAPILLARY
Glucose-Capillary: 210 mg/dL — ABNORMAL HIGH (ref 70–99)
Glucose-Capillary: 192 mg/dL — ABNORMAL HIGH (ref 70–99)
Glucose-Capillary: 259 mg/dL — ABNORMAL HIGH (ref 70–99)
Glucose-Capillary: 316 mg/dL — ABNORMAL HIGH (ref 70–99)
Glucose-Capillary: 162 mg/dL — ABNORMAL HIGH (ref 70–99)

## 2021-12-13 LAB — BASIC METABOLIC PANEL
Anion gap: 6 (ref 5–15)
BUN: 29 mg/dL — ABNORMAL HIGH (ref 8–23)
CO2: 27 mmol/L (ref 22–32)
Calcium: 10.4 mg/dL — ABNORMAL HIGH (ref 8.9–10.3)
Chloride: 103 mmol/L (ref 98–111)
Creatinine, Ser: 0.58 mg/dL (ref 0.44–1.00)
GFR, Estimated: >60 mL/min (ref >60)
Glucose, Bld: 210 mg/dL — ABNORMAL HIGH (ref 70–99)
Potassium: 4.3 mmol/L (ref 3.5–5.1)
Sodium: 136 mmol/L (ref 135–145)

## 2021-12-13 LAB — PROCALCITONIN: Procalcitonin: <0.1 ng/mL

## 2021-12-13 LAB — BRAIN NATRIURETIC PEPTIDE: B Natriuretic Peptide: 94 pg/mL (ref 0.0–100.0)

## 2021-12-13 LAB — MAGNESIUM: Magnesium: 1.7 mg/dL (ref 1.7–2.4)

## 2021-12-13 MED ORDER — INSULIN GLARGINE-YFGN 100 UNIT/ML ~~LOC~~ SOLN
60.0000 [IU] | Freq: Every day | SUBCUTANEOUS | Status: DC
  Administered 2021-12-14 – 2021-12-17 (×4): 60 [IU] via SUBCUTANEOUS
  Filled 2021-12-13 (×5): qty 0.6

## 2021-12-13 MED ORDER — FUROSEMIDE 10 MG/ML IJ SOLN
40.0000 mg | Freq: Once | INTRAMUSCULAR | Status: AC
  Administered 2021-12-13: 40 mg via INTRAVENOUS
  Filled 2021-12-13: qty 4

## 2021-12-13 MED ORDER — METHYLPREDNISOLONE SODIUM SUCC 125 MG IJ SOLR
80.0000 mg | Freq: Two times a day (BID) | INTRAMUSCULAR | Status: DC
  Administered 2021-12-13 – 2021-12-17 (×8): 80 mg via INTRAVENOUS
  Filled 2021-12-13 (×8): qty 2

## 2021-12-13 NOTE — Progress Notes (Signed)
Speech Language Pathology Treatment: Dysphagia  Patient Details Name: Michaela Moon MRN: [id number] DOB: 1935/05/07 Today's Date: 12/13/2021 Time: 0258-5277 SLP Time Calculation (min) (ACUTE ONLY): 12 min  Assessment / Plan / Recommendation Clinical Impression  Limited visit this date. Pt had already consumed her breakfast and son in room. Son reports that Pt got "choked" on her muffin this AM and SLP reviewed importance of eating slowly with small bites given her oxygen demands and likely difficulty coordinating respiration with swallow (possible desats with exertion). She is now on 15L high flow. Continue D3/mech soft and thin liquids with aspiration and reflux precautions. SLP will follow during acute stay prn.    HPI HPI: 86 y.o. female with medical history significant of hypertension, T2DM, COPD, dysphagia requiring dilation who presents to the emergency department due to shortness of breath.  She was recently admitted from 10/4 to 10/10 due to acute on chronic hypoxemic respiratory failure secondary to multifocal pneumonia which was treated with IV Rocephin and azithromycin. Prior to this, she was admitted at [facility]- admitted 9/2 to 9/12 for acute respiratory failure, required BiPAP.  She was positive for COVID and treated with antibiotics and Paxlovid for multifocal pneumonia. [name] reports to me she has had PNA ~5-7 times in the past 2 years. CXR reveals: "Interval partial clearing of bibasilar airspace opacities, most  consistent with improving pneumonia. Continued follow-up recommended  to document complete clearing. No new findings identified." BSE requested.      SLP Plan  Continue with current plan of care      Recommendations for follow up therapy are one component of a multi-disciplinary discharge planning process, led by the attending physician.  Recommendations may be updated based on patient status, additional functional criteria and insurance authorization.     Recommendations  Diet recommendations: Dysphagia 3 (mechanical soft);Thin liquid Liquids provided via: Cup;Straw Medication Administration: Whole meds with liquid Supervision: Patient able to self feed Compensations: Slow rate Postural Changes and/or Swallow Maneuvers: Seated upright 90 degrees;Upright 30-60 min after meal                Oral Care Recommendations: Oral care BID;Staff/trained caregiver to provide oral care Follow Up Recommendations: Follow physician's recommendations for discharge plan and follow up therapies Assistance recommended at discharge: Intermittent Supervision/Assistance SLP Visit Diagnosis: Dysphagia, unspecified (R13.10) Plan: Continue with current plan of care          Thank you,  Genene Churn, [place]  12/13/2021, 1:51 PM

## 2021-12-13 NOTE — Progress Notes (Signed)
PROGRESS NOTE  NIKEISHA KLUTZ LEX:[id number] DOB: 01-18-1936 DOA: 12/07/2021 PCP: [name], MD  Brief History:  86 y.o. female with medical history significant of hypertension, T2DM, COPD, dysphagia requiring dilation who presents to the emergency department due to shortness of breath. She was recently admitted from 10/4 to 10/10 due to acute on chronic hypoxemic respiratory failure secondary to multifocal pneumonia which was treated with IV Rocephin and azithromycin.  She completed a course of antibiotics while still in the hospital and O2 sat was weaned down to 4 to 5 L via [place].  It appeared that patient was on supplemental oxygen at 2 LPM on returning home after discharge.  Home oxygen was being monitored at home and when the home health nurse went to see patient yesterday, she was slightly hypoxic, home oxygen was monitored within the past 24 hours and was noted to have dropped down into the low 80s and the oxygen was increased to 5 L/min via [place], despite this, patient was still hypoxic with an O2 sat of 82% on arrival to the ED Patient was also recently admitted to [facility] from 9/2 to 9/12 due to multifocal pneumonia and acute respiratory failure with hypoxia due to COVID-19 during which she was transferred to ICU and she required BiPAP, she was treated with Paxlovid for 5 days and she continued Decadron and empiric antibiotic.  She was discharged with supplemental oxygen via [place] at 2 LPM   At bedside, patient was requiring supplemental oxygen via [facility] at 15 L, she was not in any acute distress.  There was concern regarding food getting stuck in her throat/chest.   ED Course:  In the emergency department, she was intermittently tachypneic, BP was 131/57, temperature 97.72F, pulse 100 bpm, O2 sat 95% on NRB.  Work-up in the ED showed WBC 15.3, hemoglobin 8.8, hematocrit 32.8, MCV 82.2, platelets 208.  BMP showed sodium 139, potassium 3.7, chloride 107, bicarb 24, glucose 88, BUN  11, creatinine 0.77.  Lactic acid 2.8 > 2.3, BNP 116 (this was 329 about 9 days ago).  Influenza A, B, SARS coronavirus 2 was negative.  Blood culture was pending.  Chest x-ray showed Interval partial clearing of bibasilar airspace opacities, most consistent with improving pneumonia. Solu-Medrol 25 mg x 1 was given, she was started on IV ceftriaxone and azithromycin, IV hydration was provided.  Hospitalist was asked to admit patient for further evaluation and management.   Assessment/Plan:  Acute on chronic respiratory failure with hypoxia O2 sat dropped to 80s on supplemental oxygen via [place] at 5 LPM and required up to [facility] at 30 L/min at 100% to maintain sats Goal pulse ox is >87% no need for pulse ox to be 100%  On 2L at home Weaning to 20L at 50% FiO2 Lasix 40 mg IV x 1 on 10/18 and 10/19   Acute exacerbation of COPD 10/14 CTA chest--negative for PE but reports concern for atypical/noninfectious pneumonia--interstitial & alveolar opacities Appreciate pulmonology recommendations.  Continue duo nebs, Mucinex, Solu-Medrol, Singulair  Finished 5 days ceftriaxone/azithromycin. Continue Protonix to prevent steroid-induced ulcer Continue incentive spirometry and flutter valve 12/08/21 TTE: LVEF 60-65% , no WMA, G1 DD, normal RVF 10/19--weaning steroids   Lobar pneumonia  Chest CTA with concern for noninfectious/atypical pneumonia  Consulted pulmonology on 10/16.  Procalcitonin <0.10 Patient was started on ceftriaxone and azithromycin Follow up blood culture, sputum culture, urine Legionella, strep pneumo and procalcitonin Continue Tylenol as needed Continue  Mucinex, incentive spirometry, flutter valve    Bilateral Calf DVT Confirmed by venous US 10/14 TED  [facility] Apixaban ordered dosed per pharm D  PE was ruled out by CTA  -may need to hold apixaban if hemoptysis worsens   Lactic acidosis  -from hypoxia Lactic acid 2.8 > 2.3 trended down     Dysphagia Last EGD on 04/2016 by Dr.  Oneida Alar  showed no endoscopic esophageal abnormality to explain patient's dysphagia. Esophagus dilated per medical record Seen by speech--dys 3 with thin   T2DM with steroid induced hyperglycemia  12/08/21 A1C--6.3 Continue ISS and hypoglycemia protocol Increase Semglee 60 units plus novolog 18 units TID with meals, plus supplemental coverage and frequent CBG monitoring   Iron deficiency anemia Continue ferrous sulfate   Essential hypertension Continue losartan, Lopressor, add hydralazine    Mixed hyperlipidemia Continue Lipitor   GERD Continue Protonix   Peripheral neuropathy Continue Neurontin    Family Communication:  son at bedside 10/19   Consultants:  pulm   Code Status:  DNR   DVT Prophylaxis:  apixaban     Procedures: As Listed in Progress Note Above   Antibiotics: Ceftriaxone 10/13>>10/17 Azithro 10/13>>10/17      Subjective: Patient denies fevers, chills, headache, chest pain,nausea, vomiting, diarrhea, abdominal pain, dysuria, hematuria, hematochezia, and melena. She still has some dyspnea, but improving  Objective: Vitals:   12/13/21 1114 12/13/21 1130 12/13/21 1200 12/13/21 1306  BP:   119/78   Pulse:   72   Resp:  16 17   Temp: 98.4 F (36.9 C)     TempSrc: Oral     SpO2:  91% 92% 94%  Weight:      Height:        Intake/Output Summary (Last 24 hours) at 12/13/2021 1326 Last data filed at 12/13/2021 0943 Gross per 24 hour  Intake 577.08 ml  Output 2750 ml  Net -2172.92 ml   Weight change:  Exam:  General:  Pt is alert, follows commands appropriately, not in acute distress HEENT: No icterus, No thrush, No neck mass, [place]/AT Cardiovascular: RRR, S1/S2, no rubs, no gallops Respiratory: bibasilar rales. No wheeze Abdomen: Soft/+BS, non tender, non distended, no guarding Extremities: trace LE edema, No lymphangitis, No petechiae, No rashes, no synovitis   Data Reviewed: I have personally reviewed following labs and imaging  studies Basic Metabolic Panel: Recent Labs  Lab 12/08/21 0344 12/09/21 0343 12/10/21 0418 12/11/21 0308 12/13/21 0308  NA 138 138 137 137 136  K 4.5 4.9 4.8 4.9 4.3  CL 106 104 104 104 103  CO2 '25 28 27 25 27  '$ GLUCOSE 292* 297* 196* 245* 210*  BUN '12 15 16 19 '$ 29*  CREATININE 0.68 0.70 0.60 0.55 0.58  CALCIUM 10.1 10.1 10.1 10.3 10.4*  MG 1.5* 1.8 2.0 1.7 1.7  PHOS 3.9  --   --  2.8  --    Liver Function Tests: Recent Labs  Lab 12/07/21 1458 12/08/21 0344  AST 32 23  ALT 34 26  ALKPHOS 52 45  BILITOT 0.4 0.5  PROT 7.2 6.4*  ALBUMIN 3.3* 2.8*   No results for input(s): "LIPASE", "AMYLASE" in the last 168 hours. No results for input(s): "AMMONIA" in the last 168 hours. Coagulation Profile: Recent Labs  Lab 12/07/21 1458  INR 1.1   CBC: Recent Labs  Lab 12/07/21 1458 12/08/21 0344 12/09/21 0343 12/10/21 0418 12/11/21 0308 12/12/21 0324  WBC 15.3* 9.4 12.1* 11.4* 11.9* 14.1*  NEUTROABS 10.6*  --   --   --  10.1*  --   HGB 9.8* 8.6* 8.5* 8.7* 9.3* 9.4*  HCT 32.8* 29.0* 28.8* 29.3* 31.3* 31.5*  MCV 82.2 81.2 82.1 81.4 83.2 82.9  PLT 208 182 170 166 168 177   Cardiac Enzymes: No results for input(s): "CKTOTAL", "CKMB", "CKMBINDEX", "TROPONINI" in the last 168 hours. BNP: Invalid input(s): "POCBNP" CBG: Recent Labs  Lab 12/12/21 1701 12/12/21 2017 12/13/21 0303 12/13/21 0743 12/13/21 1110  GLUCAP 270* 238* 210* 192* 259*   HbA1C: No results for input(s): "HGBA1C" in the last 72 hours. Urine analysis: No results found for: "COLORURINE", "APPEARANCEUR", "LABSPEC", "PHURINE", "GLUCOSEU", "HGBUR", "BILIRUBINUR", "KETONESUR", "PROTEINUR", "UROBILINOGEN", "NITRITE", "LEUKOCYTESUR" Sepsis Labs: '@LABRCNTIP'$ (procalcitonin:4,lacticidven:4) ) Recent Results (from the past 240 hour(s))  Resp Panel by RT-PCR (Flu A&B, Covid) Anterior Nasal Swab     Status: None   Collection Time: 12/07/21  2:58 PM   Specimen: Anterior Nasal Swab  Result Value Ref Range Status    SARS Coronavirus 2 by RT PCR NEGATIVE NEGATIVE Final    Comment: (NOTE) SARS-CoV-2 target nucleic acids are NOT DETECTED.  The SARS-CoV-2 RNA is generally detectable in upper respiratory specimens during the acute phase of infection. The lowest concentration of SARS-CoV-2 viral copies this assay can detect is 138 copies/mL. A negative result does not preclude SARS-Cov-2 infection and should not be used as the sole basis for treatment or other patient management decisions. A negative result may occur with  improper specimen collection/handling, submission of specimen other than nasopharyngeal swab, presence of viral mutation(s) within the areas targeted by this assay, and inadequate number of viral copies(<138 copies/mL). A negative result must be combined with clinical observations, patient history, and epidemiological information. The expected result is Negative.  Fact Sheet for Patients:  EntrepreneurPulse.com.au  Fact Sheet for Healthcare Providers:  IncredibleEmployment.be  This test is no t yet approved or cleared by the [place] FDA and  has been authorized for detection and/or diagnosis of SARS-CoV-2 by FDA under an Emergency Use Authorization (EUA). This EUA will remain  in effect (meaning this test can be used) for the duration of the COVID-19 declaration under Section 564(b)(1) of the Act, 21 U.S.C.section 360bbb-3(b)(1), unless the authorization is terminated  or revoked sooner.       Influenza A by PCR NEGATIVE NEGATIVE Final   Influenza B by PCR NEGATIVE NEGATIVE Final    Comment: (NOTE) The Xpert Xpress SARS-CoV-2/FLU/RSV plus assay is intended as an aid in the diagnosis of influenza from Nasopharyngeal swab specimens and should not be used as a sole basis for treatment. Nasal washings and aspirates are unacceptable for Xpert Xpress SARS-CoV-2/FLU/RSV testing.  Fact Sheet for  Patients: EntrepreneurPulse.com.au  Fact Sheet for Healthcare Providers: IncredibleEmployment.be  This test is not yet approved or cleared by the [place] FDA and has been authorized for detection and/or diagnosis of SARS-CoV-2 by FDA under an Emergency Use Authorization (EUA). This EUA will remain in effect (meaning this test can be used) for the duration of the COVID-19 declaration under Section 564(b)(1) of the Act, 21 U.S.C. section 360bbb-3(b)(1), unless the authorization is terminated or revoked.  Performed at [facility], [address], [place], [place] [address]   Blood Culture (routine x 2)     Status: None   Collection Time: 12/07/21  2:58 PM   Specimen: BLOOD RIGHT FOREARM  Result Value Ref Range Status   Specimen Description BLOOD RIGHT FOREARM  Final   Special Requests   Final    BOTTLES DRAWN AEROBIC AND ANAEROBIC Blood Culture  adequate volume   Culture   Final    NO GROWTH 5 DAYS Performed at [facility], [address] [place], [place] [address]    Report Status 12/12/2021 FINAL  Final  Blood Culture (routine x 2)     Status: None   Collection Time: 12/07/21  3:03 PM   Specimen: Right Antecubital; Blood  Result Value Ref Range Status   Specimen Description RIGHT ANTECUBITAL  Final   Special Requests   Final    BOTTLES DRAWN AEROBIC AND ANAEROBIC Blood Culture results may not be optimal due to an inadequate volume of blood received in culture bottles   Culture   Final    NO GROWTH 5 DAYS Performed at [facility], [address] [place], [place] [address]    Report Status 12/12/2021 FINAL  Final  MRSA Next Gen by PCR, Nasal     Status: None   Collection Time: 12/10/21  5:50 PM   Specimen: Nasal Mucosa; Nasal Swab  Result Value Ref Range Status   MRSA by PCR Next Gen NOT DETECTED NOT DETECTED Final    Comment: (NOTE) The GeneXpert MRSA Assay (FDA approved for NASAL specimens only), is one component of a  comprehensive MRSA colonization surveillance program. It is not intended to diagnose MRSA infection nor to guide or monitor treatment for MRSA infections. Test performance is not FDA approved in patients less than 19 years old. Performed at [facility], [address], [place], [place] [address]      Scheduled Meds:  apixaban  10 mg Oral BID   Followed by   Derrill Memo ON 12/15/2021] apixaban  5 mg Oral BID   aspirin  81 mg Oral Daily   atorvastatin  80 mg Oral Daily   Chlorhexidine Gluconate Cloth  6 each Topical Q0600   docusate sodium  100 mg Oral Daily   feeding supplement (GLUCERNA SHAKE)  237 mL Oral TID BM   ferrous sulfate  325 mg Oral Daily   gabapentin  100 mg Oral TID   hydrALAZINE  50 mg Oral Q8H   insulin aspart  0-15 Units Subcutaneous TID WC   insulin aspart  18 Units Subcutaneous TID WC   [START ON 12/14/2021] insulin glargine-yfgn  60 Units Subcutaneous Daily   ipratropium-albuterol  3 mL Nebulization Q6H   losartan  100 mg Oral Daily   methylPREDNISolone (SOLU-MEDROL) injection  80 mg Intravenous Q12H   metoprolol tartrate  50 mg Oral BID   mometasone-formoterol  2 puff Inhalation BID   montelukast  10 mg Oral QHS   pantoprazole  40 mg Oral Daily   polyvinyl alcohol  1 drop Both Eyes QID   umeclidinium bromide  1 puff Inhalation Daily   Continuous Infusions:  lactated ringers 30 mL/hr at 12/13/21 9741    Procedures/Studies: DG Chest 1 View  Result Date: 12/13/2021 CLINICAL DATA:  Shortness of breath EXAM: CHEST  1 VIEW COMPARISON:  12/11/2021 FINDINGS: Mild cardiomegaly and vascular congestion. Mild interstitial prominence and lower lobe airspace opacities. Findings similar to prior study. No visible effusions. No acute bony abnormality. Chronic healed proximal left humeral fracture. IMPRESSION: Cardiomegaly with vascular congestion and probable mild interstitial edema. Lower lobe airspace opacities are unchanged. Electronically Signed   By: Rolm Baptise M.D.    On: 12/13/2021 01:04   DG Chest Port 1 View  Result Date: 12/11/2021 CLINICAL DATA:  5626 with acute respiratory failure. EXAM: PORTABLE CHEST 1 VIEW COMPARISON:  12/08/2021 portable chest and chest CT. FINDINGS: 4:49  a.m. There is cardiomegaly again noted and mild central vascular prominence, with aortic atherosclerosis. Stable mediastinum. Interstitial and patchy alveolar opacities in the mid to lower lung fields continue to be seen, with a basal gradient. There are minimal pleural effusions. There is improvement in opacities in the base of both lungs but significant opacity remains and is otherwise unaltered. The upper 1/3 of the lungs remain generally clear. There is osteopenia and degenerative change thoracic spine, chronic healed fracture deformity proximal left humerus. IMPRESSION: Improved aeration at the base of both lungs. No other noteworthy change in the bilateral lung opacities and underlying interstitial prominence. Cardiomegaly. Electronically Signed   By: Telford Nab M.D.   On: 12/11/2021 06:29   DG CHEST PORT 1 VIEW  Result Date: 12/08/2021 CLINICAL DATA:  Respiratory failure EXAM: PORTABLE CHEST 1 VIEW COMPARISON:  12/07/2021 FINDINGS: No significant change in AP portable chest radiograph. Cardiomegaly with bilateral interstitial and heterogeneous airspace opacity of the lung bases, and probable small layering pleural effusions. Partially imaged chronic fracture deformity of the proximal left humerus. IMPRESSION: No significant change in AP portable chest radiograph. Cardiomegaly with bilateral interstitial and heterogeneous airspace opacity of the lung bases, and probable small layering pleural effusions. Electronically Signed   By: Delanna Ahmadi M.D.   On: 12/08/2021 16:16   ECHOCARDIOGRAM COMPLETE  Result Date: 12/08/2021    ECHOCARDIOGRAM REPORT   Patient Name:   BRYANT LIPPS Date of Exam: 12/08/2021 Medical Rec #:  [id number]       Height:       65.0 in Accession #:     [id number]      Weight:       178.8 lb Date of Birth:  1935-02-27        BSA:          1.886 m Patient Age:    49 years        BP:           160/42 mmHg Patient Gender: F               HR:           68 bpm. Exam Location:  [facility] Procedure: 2D Echo, Cardiac Doppler and Color Doppler Indications:    CHF  History:        Patient has no prior history of Echocardiogram examinations.                 COPD; Risk Factors:Hypertension, Diabetes and Dyslipidemia.  Sonographer:    Wenda Low Referring Phys: [id number] OLADAPO ADEFESO IMPRESSIONS  1. Left ventricular ejection fraction, by estimation, is 60 to 65%. The left ventricle has normal function. The left ventricle has no regional wall motion abnormalities. The left ventricular internal cavity size was mildly dilated. There is mild left ventricular hypertrophy of the septal segment. Left ventricular diastolic parameters are consistent with Grade I diastolic dysfunction (impaired relaxation).  2. Right ventricular systolic function is normal. The right ventricular size is normal. There is normal pulmonary artery systolic pressure.  3. The mitral valve is grossly normal. No evidence of mitral valve regurgitation.  4. The aortic valve is tricuspid. Aortic valve regurgitation is not visualized. No aortic stenosis is present.  5. The inferior vena cava is normal in size with greater than 50% respiratory variability, suggesting right atrial pressure of 3 mmHg. Comparison(s): No prior Echocardiogram. FINDINGS  Left Ventricle: Left ventricular ejection fraction, by estimation, is 60 to 65%. The left ventricle has normal function.  The left ventricle has no regional wall motion abnormalities. The left ventricular internal cavity size was mildly dilated. There is  mild left ventricular hypertrophy of the septal segment. Left ventricular diastolic parameters are consistent with Grade I diastolic dysfunction (impaired relaxation). Right Ventricle: The right ventricular size is  normal. No increase in right ventricular wall thickness. Right ventricular systolic function is normal. There is normal pulmonary artery systolic pressure. The tricuspid regurgitant velocity is 2.06 m/s, and  with an assumed right atrial pressure of 3 mmHg, the estimated right ventricular systolic pressure is 25.4 mmHg. Left Atrium: Left atrial size was normal in size. Right Atrium: Right atrial size was normal in size. Pericardium: There is no evidence of pericardial effusion. Mitral Valve: The mitral valve is grossly normal. Mild mitral annular calcification. No evidence of mitral valve regurgitation. MV peak gradient, 4.9 mmHg. The mean mitral valve gradient is 2.0 mmHg. Tricuspid Valve: The tricuspid valve is normal in structure. Tricuspid valve regurgitation is not demonstrated. No evidence of tricuspid stenosis. Aortic Valve: The aortic valve is tricuspid. There is mild aortic valve annular calcification. Aortic valve regurgitation is not visualized. No aortic stenosis is present. Aortic valve mean gradient measures 6.0 mmHg. Aortic valve peak gradient measures 12.1 mmHg. Aortic valve area, by VTI measures 2.03 cm. Pulmonic Valve: The pulmonic valve was normal in structure. Pulmonic valve regurgitation is not visualized. No evidence of pulmonic stenosis. Aorta: The aortic root is normal in size and structure. Venous: The inferior vena cava is normal in size with greater than 50% respiratory variability, suggesting right atrial pressure of 3 mmHg. IAS/Shunts: No atrial level shunt detected by color flow Doppler.  LEFT VENTRICLE PLAX 2D LVIDd:         5.30 cm   Diastology LVIDs:         3.20 cm   LV e' medial:    10.00 cm/s LV PW:         1.20 cm   LV E/e' medial:  7.4 LV IVS:        1.00 cm   LV e' lateral:   8.05 cm/s LVOT diam:     1.90 cm   LV E/e' lateral: 9.2 LV SV:         77 LV SV Index:   41 LVOT Area:     2.84 cm  RIGHT VENTRICLE RV Basal diam:  3.30 cm RV Mid diam:    2.40 cm RV S prime:     15.70  cm/s TAPSE (M-mode): 3.0 cm LEFT ATRIUM             Index        RIGHT ATRIUM           Index LA diam:        4.00 cm 2.12 cm/m   RA Area:     14.80 cm LA Vol (A2C):   52.9 ml 28.05 ml/m  RA Volume:   38.50 ml  20.41 ml/m LA Vol (A4C):   54.0 ml 28.63 ml/m LA Biplane Vol: 55.5 ml 29.43 ml/m  AORTIC VALVE                     PULMONIC VALVE AV Area (Vmax):    1.66 cm      PV Vmax:       1.05 m/s AV Area (Vmean):   1.69 cm      PV Peak grad:  4.4 mmHg AV Area (VTI):     2.03  cm AV Vmax:           174.00 cm/s AV Vmean:          118.000 cm/s AV VTI:            0.380 m AV Peak Grad:      12.1 mmHg AV Mean Grad:      6.0 mmHg LVOT Vmax:         102.00 cm/s LVOT Vmean:        70.400 cm/s LVOT VTI:          0.272 m LVOT/AV VTI ratio: 0.72  AORTA Ao Root diam: 3.20 cm MITRAL VALVE                TRICUSPID VALVE MV Area (PHT): 2.92 cm     TR Peak grad:   17.0 mmHg MV Area VTI:   2.38 cm     TR Vmax:        206.00 cm/s MV Peak grad:  4.9 mmHg MV Mean grad:  2.0 mmHg     SHUNTS MV Vmax:       1.11 m/s     Systemic VTI:  0.27 m MV Vmean:      63.0 cm/s    Systemic Diam: 1.90 cm MV Decel Time: 260 msec MV E velocity: 73.70 cm/s MV A velocity: 104.00 cm/s MV E/A ratio:  0.71 Rudean Haskell MD Electronically signed by Rudean Haskell MD Signature Date/Time: 12/08/2021/2:14:12 PM    Final    CT Angio Chest Pulmonary Embolism (PE) W or WO Contrast  Result Date: 12/08/2021 CLINICAL DATA:  Positive D-dimer.  DVT EXAM: CT ANGIOGRAPHY CHEST WITH CONTRAST TECHNIQUE: Multidetector CT imaging of the chest was performed using the standard protocol during bolus administration of intravenous contrast. Multiplanar CT image reconstructions and MIPs were obtained to evaluate the vascular anatomy. RADIATION DOSE REDUCTION: This exam was performed according to the departmental dose-optimization program which includes automated exposure control, adjustment of the mA and/or kV according to patient size and/or use of  iterative reconstruction technique. CONTRAST:  14m OMNIPAQUE IOHEXOL 350 MG/ML SOLN COMPARISON:  11/28/2021 FINDINGS: Cardiovascular: Satisfactory opacification of the pulmonary arteries to the segmental level. No evidence of pulmonary embolism. Normal heart size. No pericardial effusion. Extensive atheromatous calcification of the aorta and coronaries Mediastinum/Nodes: Negative for adenopathy. Lungs/Pleura: Unchanged pattern of multifocal consolidation affecting all lobes with ground-glass, nodular, and consolidative opacities. Lower lobe airspace opacity is new/progressed from July 2023 but remaining opacity is very similar. Central airways are clear. Mild emphysematous change. In an otherwise clear area of lung there is an 8 mm irregular pulmonary nodule which is known from prior CTs. Upper Abdomen: Atheromatous plaque. Dystrophic type calcification in the right posterior liver. Granulomatous calcifications in the spleen. Musculoskeletal: No acute finding. Generalized thoracic spine degeneration. Review of the MIP images confirms the above findings. IMPRESSION: 1. Negative for pulmonary embolism. 2. Multi lobar airspace disease much of which is chronic when compared to a July 2023 CT. Lingular opacity is definitely chronic when compared to a January 2023 CT, and progressive. Consider atypical and non infectious pneumonias. Consider alveolar tumor. 3. Known spiculated nodule in the right upper lobe. Electronically Signed   By: JJorje GuildM.D.   On: 12/08/2021 12:02   [place] Img Lower Bilateral (DVT)  Result Date: 12/08/2021 CLINICAL DATA:  Bilateral lower extremity edema. EXAM: BILATERAL LOWER EXTREMITY VENOUS DOPPLER ULTRASOUND TECHNIQUE: Gray-scale sonography with graded compression, as well as color Doppler and duplex ultrasound  were performed to evaluate the lower extremity deep venous systems from the level of the common femoral vein and including the common femoral, femoral, profunda femoral,  popliteal and calf veins including the posterior tibial, peroneal and gastrocnemius veins when visible. The superficial great saphenous vein was also interrogated. Spectral Doppler was utilized to evaluate flow at rest and with distal augmentation maneuvers in the common femoral, femoral and popliteal veins. COMPARISON:  None Available. FINDINGS: RIGHT LOWER EXTREMITY Common Femoral Vein: No evidence of thrombus. Normal compressibility, respiratory phasicity and response to augmentation. Saphenofemoral Junction: No evidence of thrombus. Normal compressibility and flow on color Doppler imaging. Profunda Femoral Vein: No evidence of thrombus. Normal compressibility and flow on color Doppler imaging. Femoral Vein: No evidence of thrombus. Normal compressibility, respiratory phasicity and response to augmentation. Popliteal Vein: No evidence of thrombus. Normal compressibility, respiratory phasicity and response to augmentation. Calf Veins: Normal patency of posterior tibial vein. There is some visualized thrombus in the right peroneal vein which appears occlusive in the proximal calf. Gastrocnemius thrombus identified in the right calf. Superficial Great Saphenous Vein: No evidence of thrombus. Normal compressibility. Venous Reflux:  None. Other Findings: No evidence of superficial thrombophlebitis or abnormal fluid collection. LEFT LOWER EXTREMITY Common Femoral Vein: No evidence of thrombus. Normal compressibility, respiratory phasicity and response to augmentation. Saphenofemoral Junction: No evidence of thrombus. Normal compressibility and flow on color Doppler imaging. Profunda Femoral Vein: No evidence of thrombus. Normal compressibility and flow on color Doppler imaging. Femoral Vein: No evidence of thrombus. Normal compressibility, respiratory phasicity and response to augmentation. Popliteal Vein: No evidence of thrombus. Normal compressibility, respiratory phasicity and response to augmentation. Calf Veins:  Posterior tibial vein is normally patent. Left peroneal vein DVT visualized. Superficial Great Saphenous Vein: No evidence of thrombus. Normal compressibility. Venous Reflux:  None. Other Findings: No evidence of superficial thrombophlebitis or abnormal fluid collection. IMPRESSION: Positive for bilateral calf DVT in the peroneal veins and right gastrocnemius vein. Electronically Signed   By: Aletta Edouard M.D.   On: 12/08/2021 11:50   DG Chest Port 1 View  Result Date: 12/07/2021 CLINICAL DATA:  Questionable sepsis. Evaluate for abnormality. Hypoxemia. History of asthma/COPD. EXAM: PORTABLE CHEST 1 VIEW COMPARISON:  Radiographs 11/29/2021 and 11/28/2021.  CT 11/28/2021. FINDINGS: 1509 hours. The heart size and mediastinal contours appear stable. Compared with the most recent studies, there is partial clearing of the bibasilar airspace opacities, most consistent with improving pneumonia. No pneumothorax or significant pleural effusion identified. No acute osseous findings are seen. Telemetry leads overlie the chest. There is multilevel thoracic spondylosis. IMPRESSION: Interval partial clearing of bibasilar airspace opacities, most consistent with improving pneumonia. Continued follow-up recommended to document complete clearing. No new findings identified. Electronically Signed   By: Richardean Sale M.D.   On: 12/07/2021 15:27   DG Chest 1 View  Result Date: 11/29/2021 CLINICAL DATA:  Shortness of breath EXAM: CHEST  1 VIEW COMPARISON:  Chest x-ray 11/28/2021. FINDINGS: Cardiomediastinal silhouette is within normal limits. There is increasing patchy airspace disease in the bilateral mid and lower lungs. There is no pleural effusion or pneumothorax. No acute fractures are seen. There is a healed left humeral neck fracture. IMPRESSION: Increasing patchy airspace disease in the bilateral mid and lower lungs, concerning for multifocal pneumonia. Electronically Signed   By: Ronney Asters M.D.   On: 11/29/2021  20:36   CT Angio Chest PE W and/or Wo Contrast  Result Date: 11/28/2021 CLINICAL DATA:  PE suspected.  Low O2 sats. EXAM:  CT ANGIOGRAPHY CHEST WITH CONTRAST TECHNIQUE: Multidetector CT imaging of the chest was performed using the standard protocol during bolus administration of intravenous contrast. Multiplanar CT image reconstructions and MIPs were obtained to evaluate the vascular anatomy. RADIATION DOSE REDUCTION: This exam was performed according to the departmental dose-optimization program which includes automated exposure control, adjustment of the mA and/or kV according to patient size and/or use of iterative reconstruction technique. CONTRAST:  56m OMNIPAQUE IOHEXOL 350 MG/ML SOLN COMPARISON:  CT chest 09/20/2021 FINDINGS: Cardiovascular: Satisfactory opacification of the pulmonary arteries to the segmental level. No evidence of pulmonary embolism. Normal heart size. No pericardial effusion. Calcific aortic atherosclerosis. Multivessel coronary artery calcification. Mediastinum/Nodes: No enlarged mediastinal, hilar, or axillary lymph nodes. Thyroid gland and trachea demonstrate no significant findings. Patulous esophagus. Lungs/Pleura: A 1.0 x 0.8 cm irregular nodule in the anterior right upper lobe appears subsolid on today's exam (series 6, image 57). Irregular consolidation with air bronchograms and surrounding ground-glass opacities dependently located in the left upper lobe is not significantly changed compared to 09/20/2021. Airspace consolidation with air bronchograms dependently located in the right upper and right middle lobes with adjacent ground-glass opacities are increased compared to 09/20/2021. Similarly, patchy consolidations with surrounding ground-glass opacities in the lower lungs are also increased compared to 09/16/2021, particularly in the right lower lobe. No pleural effusion or pneumothorax. Upper Abdomen: No acute abnormality. Benign left hepatic cyst again noted. No follow-up  imaging indicated. Musculoskeletal: No chest wall abnormality. No acute or suspicious osseous findings. Multilevel degenerative changes in the thoracic and visualized upper lumbar spine. Review of the MIP images confirms the above findings. IMPRESSION: 1. No pulmonary embolism. 2. Multifocal, dependently located, consolidations bilaterally, favored to represent multifocal pneumonia, possibly aspiration related. Recommend follow-up CT imaging in 3 months after treatment to ensure resolution. 3. The 1 cm nodule in the anterior right upper lobe appears subsolid on today's exam. As previously noted, bronchogenic carcinoma cannot be excluded. Recommend attention on follow-up imaging. 4. Multivessel coronary artery disease. Aortic Atherosclerosis (ICD10-I70.0). Electronically Signed   By: LIleana RoupM.D.   On: 11/28/2021 16:15   DG Chest Port 1 View  Result Date: 11/28/2021 CLINICAL DATA:  Dyspnea, shaking, hypoxia, history COPD, asthma, diabetes mellitus, hypertension EXAM: PORTABLE CHEST 1 VIEW COMPARISON:  Portable exam 1304 hours compared to 10/31/2021 FINDINGS: Upper normal size of cardiac silhouette. Mediastinal contours and pulmonary vascularity normal. Atherosclerotic calcification aorta. Bibasilar infiltrates question multifocal pneumonia. Minimal pleural effusions. No pneumothorax. Bones demineralized with posttraumatic deformity proximal LEFT humerus and multilevel degenerative disc disease changes/scoliosis thoracic spine. IMPRESSION: Bibasilar pulmonary infiltrates question multifocal pneumonia. Minimal pleural effusions. Aortic Atherosclerosis (ICD10-I70.0). Electronically Signed   By: MLavonia DanaM.D.   On: 11/28/2021 13:15    DOrson Eva DO  [facility]  If 7PM-7AM, please contact night-coverage [email or web address] Password [email or web address] 12/13/2021, 1:26 PM   LOS: 6 days

## 2021-12-13 NOTE — Progress Notes (Signed)
NAME:  Michaela Moon, MRN:  [id number], DOB:  [birth year], LOS: 6 ADMISSION DATE:  12/07/2021, CONSULTATION DATE:  12/13/2021  REFERRING MD:  Wynetta Emery, TRH, CHIEF COMPLAINT:  resp failure on HF [place]   History of Present Illness:  86 year old ex-smoker with COPD and chronic respiratory failure on 2 L of oxygen who was admitted for worsening hypoxia requiring up to 5 L of oxygen at home. Her oxygen requirements have increased starting on 15 L high flow in the emergency room and now up to 100% on 30 L heated high flow nasal cannula. Initial labs were significant for WBC count of 15.3, BNP 116, COVID testing was negative Chest x-ray showed partial clearing of bibasilar airspace disease.  Of note, she was hospitalized 9/2 to 9/12 and treated for COVID-19 pneumonia With Paxlovid, Decadron and empiric antibiotic discharge on 2 L She was again hospitalized 10/4 to 10/10 with multifocal pneumonia and treated with Rocephin and azithromycin, weaned down to 5 L nasal cannula  Pertinent  Medical History  hypertension,  T2DM,  dysphagia requiring dilation 04/2016  Significant Hospital Events: Including procedures, antibiotic start and stop dates in addition to other pertinent events   CT angiogram chest 12/08/2021 multilobar airspace disease with similar pattern compared to January 2023.  Lingular opacity appears chronic, lower lobe airspace disease is worse, right upper lobe spiculated nodule unchanged from prior  10/17 cleared bedside swallow evaluation   Interim History / Subjective:   Episode of frothy bloody sputum overnight, Lasix given. Remains on heated high flow 50% / 20 L Afebrile -2.3 L with Lasix  Objective   Blood pressure 119/78, pulse 72, temperature 98.4 F (36.9 C), temperature source Oral, resp. rate 17, height '5\' 5"'$  (1.651 m), weight 82.3 kg, SpO2 94 %.    FiO2 (%):  [50 %] 50 %   Intake/Output Summary (Last 24 hours) at 12/13/2021 1310 Last data filed at 12/13/2021  0943 Gross per 24 hour  Intake 577.08 ml  Output 2750 ml  Net -2172.92 ml    Filed Weights   12/10/21 0434 12/11/21 0500 12/12/21 0500  Weight: 80.7 kg 81.9 kg 82.3 kg    Examination: General: Elderly woman, sitting up in bed, no distress HENT: Mild pallor, no icterus, no JVD Lungs: No accessory muscle use, scattered bibasal crackles Cardiovascular: S1-S2 regular, no murmur Abdomen: Soft, obese, nontender, no hepatosplenomegaly Extremities: No significant edema, no deformity Neuro: Normal focal, alert and interactive  Chest x-ray 10/19 independently reviewed shows cardiomegaly with pulmonary vascular congestion, unchanged lower lobe opacities  Labs show normal electrolytes, BNP 94, negative procalcitonin  Resolved Hospital Problem list     Assessment & Plan:  Acute hypoxic respiratory failure Bilateral infiltrates -lingular patchy infiltrate dates back to January 2023 and both lower lobe infiltrates appear gradually worse compared to July 2023 on serial CT imaging  -Differential includes chronic aspiration versus inflammatory etiology, doubt malignancy.  Right upper lobe pulmonary nodule appears stable since January 2023  -Completed 5 days of ceftriaxone/azithromycin, MRSA PCR negative -Decrease IV Solu-Medrol 80 every 12, once FiO2 significantly decreased, will taper further -BiPAP as needed only if increased work of breathing -Try to transition to salter nasal cannula today    Dysphagia -esophageal dilatation 2018 for?  Web. -Cleared bedside swallow evaluation, plan for MBS once FiO2 requirements decrease and she can be safely transported  Uncontrolled type 2 diabetes -due to high-dose steroids -Increased insulin to Lantus 55  units plus NovoLog 18 units 3 times daily with meals -SSI coverage  Bilateral calf DVT -apixaban started by [facility] , may have to hold if she has increased hemoptysis  Best Practice (right click and "Reselect all SmartList Selections" daily)    Diet/type: Regular consistency (see orders) DVT prophylaxis: DOAC GI prophylaxis: N/A Lines: N/A Foley:  N/A Code Status:  full code Last date of multidisciplinary goals of care discussion  10/17, DNR issued son updated at bedside daily -10/19 he inquires about transfer to [facility] , I discussed that no interventions can be performed at tertiary center  Labs   CBC: Recent Labs  Lab 12/07/21 1458 12/08/21 0344 12/09/21 0343 12/10/21 0418 12/11/21 0308 12/12/21 0324  WBC 15.3* 9.4 12.1* 11.4* 11.9* 14.1*  NEUTROABS 10.6*  --   --   --  10.1*  --   HGB 9.8* 8.6* 8.5* 8.7* 9.3* 9.4*  HCT 32.8* 29.0* 28.8* 29.3* 31.3* 31.5*  MCV 82.2 81.2 82.1 81.4 83.2 82.9  PLT 208 182 170 166 168 177     Basic Metabolic Panel: Recent Labs  Lab 12/08/21 0344 12/09/21 0343 12/10/21 0418 12/11/21 0308 12/13/21 0308  NA 138 138 137 137 136  K 4.5 4.9 4.8 4.9 4.3  CL 106 104 104 104 103  CO2 '25 28 27 25 27  '$ GLUCOSE 292* 297* 196* 245* 210*  BUN '12 15 16 19 '$ 29*  CREATININE 0.68 0.70 0.60 0.55 0.58  CALCIUM 10.1 10.1 10.1 10.3 10.4*  MG 1.5* 1.8 2.0 1.7 1.7  PHOS 3.9  --   --  2.8  --     GFR: Estimated Creatinine Clearance: 55.5 mL/min (by C-G formula based on SCr of 0.58 mg/dL). Recent Labs  Lab 12/07/21 1458 12/07/21 1642 12/08/21 0344 12/09/21 0343 12/10/21 0418 12/11/21 0308 12/12/21 0324 12/13/21 0308  PROCALCITON  --   --  <0.10  --   --   --   --  <0.10  WBC 15.3*  --  9.4 12.1* 11.4* 11.9* 14.1*  --   LATICACIDVEN 2.8* 2.3*  --   --   --   --   --   --      Liver Function Tests: Recent Labs  Lab 12/07/21 1458 12/08/21 0344  AST 32 23  ALT 34 26  ALKPHOS 52 45  BILITOT 0.4 0.5  PROT 7.2 6.4*  ALBUMIN 3.3* 2.8*    No results for input(s): "LIPASE", "AMYLASE" in the last 168 hours. No results for input(s): "AMMONIA" in the last 168 hours.  ABG    Component Value Date/Time   PHART 7.44 12/07/2021 1518   PCO2ART 37 12/07/2021 1518   PO2ART 74 (L)  12/07/2021 1518   HCO3 25.1 12/07/2021 1518   O2SAT 95.6 12/07/2021 1518     Coagulation Profile: Recent Labs  Lab 12/07/21 1458  INR 1.1     Cardiac Enzymes: No results for input(s): "CKTOTAL", "CKMB", "CKMBINDEX", "TROPONINI" in the last 168 hours.  HbA1C: Hgb A1c MFr Bld  Date/Time Value Ref Range Status  12/08/2021 04:46 AM 6.3 (H) 4.8 - 5.6 % Final    Comment:    (NOTE) Pre diabetes:          5.7%-6.4%  Diabetes:              >6.4%  Glycemic control for   <7.0% adults with diabetes   10/21/2017 11:18 AM 7.5 (H) 4.8 - 5.6 % Final    Comment:    (NOTE) Pre diabetes:          5.7%-6.4% Diabetes:              >  6.4% Glycemic control for   <7.0% adults with diabetes     CBG: Recent Labs  Lab 12/12/21 1701 12/12/21 2017 12/13/21 0303 12/13/21 0743 12/13/21 1110  GLUCAP 270* 238* 210* [place] MD. FCCP. [facility] Pager : [phone]  If no response to pager , please call [phone] until 7 pm After 7:00 pm call Elink  [phone]   12/13/2021

## 2021-12-13 NOTE — Evaluation (Signed)
Physical Therapy Evaluation Patient Details Name: Michaela Moon MRN: [id number] DOB: Jul 04, 1935 Today's Date: 12/13/2021  History of Present Illness  Michaela Moon is a 86 y.o. female with medical history significant of hypertension, T2DM, COPD, dysphagia requiring dilation who presents to the emergency department due to shortness of breath.  She was recently admitted from 10/4 to 10/10 due to acute on chronic hypoxemic respiratory failure secondary to multifocal pneumonia which was treated with IV Rocephin and azithromycin.  She completed a course of antibiotics while still in the hospital and O2 sat was weaned down to 4 to 5 L via [place].  It appeared that patient was on supplemental oxygen at 2 LPM on returning home after discharge.  Home oxygen was being monitored at home and when the home health nurse went to see patient yesterday, she was slightly hypoxic, home oxygen was monitored within the past 24 hours and was noted to have dropped down into the low 80s and the oxygen was increased to 5 L/min via [place], despite this, patient was still hypoxic with an O2 sat of 82% on arrival to the ED  Patient was also recently admitted to [facility] from 9/2 to 9/12 due to multifocal pneumonia and acute respiratory failure with hypoxia due to COVID-19 during which she was transferred to ICU and she required BiPAP, she was treated with Paxlovid for 5 days and she continued Decadron and empiric antibiotic.  She was discharged with supplemental oxygen via [place] at 2 LPM     At bedside, patient was requiring supplemental oxygen via HFNC at 15 L, she was not in any acute distress.  There was concern regarding food getting stuck in her throat/chest.   Clinical Impression  Patient presents in chair and agreeable for therapy. Patient able to ambulate several steps forward/backward with RW and verbal cueing for walker placement, oxygen kept at 15 LPM due to SpO2 dropping from >90% to 78% upon exertion, SpO2 increased to 92%  with seated rest following ambulation. Patient tolerated staying up in chair after therapy. Patient will benefit from continued skilled physical therapy in hospital and recommended venue below to increase strength, balance, endurance for safe ADLs and gait.     Recommendations for follow up therapy are one component of a multi-disciplinary discharge planning process, led by the attending physician.  Recommendations may be updated based on patient status, additional functional criteria and insurance authorization.  Follow Up Recommendations Home health PT      Assistance Recommended at Discharge Set up Supervision/Assistance  Patient can return home with the following  A little help with walking and/or transfers;A little help with bathing/dressing/bathroom;Help with stairs or ramp for entrance;Assistance with cooking/housework    Equipment Recommendations Rolling walker (2 wheels)  Recommendations for Other Services       Functional Status Assessment Patient has had a recent decline in their functional status and demonstrates the ability to make significant improvements in function in a reasonable and predictable amount of time.     Precautions / Restrictions Precautions Precautions: Fall Restrictions Weight Bearing Restrictions: No      Mobility  Bed Mobility Overal bed mobility: Modified Independent Bed Mobility: Supine to Sit, Sit to Supine     Supine to sit: Supervision Sit to supine: Supervision   General bed mobility comments: increased time    Transfers Overall transfer level: Modified independent Equipment used: None Transfers: Sit to/from Stand, Bed to chair/wheelchair/BSC Sit to Stand: Supervision   Step pivot transfers: Supervision  General transfer comment: slightly labored movement, increased time    Ambulation/Gait Ambulation/Gait assistance: Supervision Gait Distance (Feet): 10 Feet Assistive device: Rolling walker (2 wheels) Gait  Pattern/deviations: Decreased step length - right, Decreased step length - left, Decreased stride length Gait velocity: decreased     General Gait Details: patient SpO2 >90% at rest, able to take steps forward/backward at bedside with RW but SpO2 decreased to 78% upon exertion, returned to 92% at rest after ambulation  Stairs            Wheelchair Mobility    Modified Rankin (Stroke Patients Only)       Balance Overall balance assessment: Needs assistance Sitting-balance support: Feet supported, No upper extremity supported Sitting balance-Leahy Scale: Good Sitting balance - Comments: seated EOB   Standing balance support: During functional activity, No upper extremity supported, Bilateral upper extremity supported Standing balance-Leahy Scale: Fair Standing balance comment: fair without AD, good with RW                             Pertinent Vitals/Pain Pain Assessment Pain Assessment: No/denies pain    Home Living Family/patient expects to be discharged to:: Private residence Living Arrangements: Children (son) Available Help at Discharge: Family;Available 24 hours/day Type of Home: Mobile home Home Access: Ramped entrance         Home Equipment: Standard Walker;BSC/3in1;Wheelchair - manual      Prior Function Prior Level of Function : Needs assist       Physical Assist : Mobility (physical);ADLs (physical) Mobility (physical): Bed mobility;Transfers;Gait;Stairs   Mobility Comments: household and very short distanced community using standard walker PRN ADLs Comments: assisted by family     Hand Dominance        Extremity/Trunk Assessment   Upper Extremity Assessment Upper Extremity Assessment: Overall WFL for tasks assessed    Lower Extremity Assessment Lower Extremity Assessment: Generalized weakness    Cervical / Trunk Assessment Cervical / Trunk Assessment: Kyphotic  Communication   Communication: No difficulties  Cognition  Arousal/Alertness: Awake/alert Behavior During Therapy: WFL for tasks assessed/performed Overall Cognitive Status: Within Functional Limits for tasks assessed                                          General Comments      Exercises     Assessment/Plan    PT Assessment Patient needs continued PT services  PT Problem List Decreased strength;Decreased activity tolerance;Decreased balance;Decreased mobility       PT Treatment Interventions DME instruction;Gait training;Stair training;Functional mobility training;Therapeutic activities;Therapeutic exercise;Patient/family education;Balance training    PT Goals (Current goals can be found in the Care Plan section)  Acute Rehab PT Goals Patient Stated Goal: return home with family to assist PT Goal Formulation: With patient Time For Goal Achievement: 12/27/21 Potential to Achieve Goals: Good    Frequency Min 3X/week     Co-evaluation               AM-PAC PT "6 Clicks" Mobility  Outcome Measure Help needed turning from your back to your side while in a flat bed without using bedrails?: None Help needed moving from lying on your back to sitting on the side of a flat bed without using bedrails?: None Help needed moving to and from a bed to a chair (including a wheelchair)?: A Little Help needed  standing up from a chair using your arms (e.g., wheelchair or bedside chair)?: A Little Help needed to walk in hospital room?: A Little Help needed climbing 3-5 steps with a railing? : A Lot 6 Click Score: 19    End of Session Equipment Utilized During Treatment: Oxygen Activity Tolerance: Patient tolerated treatment well;Patient limited by fatigue Patient left: in chair;with call bell/phone within reach Nurse Communication: Mobility status PT Visit Diagnosis: Unsteadiness on feet (R26.81);Other abnormalities of gait and mobility (R26.89);Muscle weakness (generalized) (M62.81)    Time: 4462-8638 PT Time  Calculation (min) (ACUTE ONLY): 10 min   Charges:   PT Evaluation $PT Eval Low Complexity: 1 Low PT Treatments $Therapeutic Activity: 8-22 mins        Zigmund Gottron, SPT

## 2021-12-13 NOTE — Plan of Care (Signed)
  Problem: Acute Rehab PT Goals(only PT should resolve) Goal: Pt Will Go Supine/Side To Sit Outcome: Progressing Flowsheets (Taken 12/13/2021 1551) Pt will go Supine/Side to Sit: with modified independence Goal: Patient Will Transfer Sit To/From Stand Outcome: Progressing Flowsheets (Taken 12/13/2021 1551) Patient will transfer sit to/from stand: with modified independence Goal: Pt Will Transfer Bed To Chair/Chair To Bed Outcome: Progressing Flowsheets (Taken 12/13/2021 1551) Pt will Transfer Bed to Chair/Chair to Bed: with modified independence Goal: Pt Will Ambulate Outcome: Progressing Flowsheets (Taken 12/13/2021 1551) Pt will Ambulate:  25 feet  with modified independence  with supervision  with rolling walker Note: Supervision due to high oxygen demands  Zigmund Gottron, SPT

## 2021-12-13 NOTE — Progress Notes (Signed)
RRT called due to patient presenting with red-tinged and white frothy secretions while coughing. Chest x-ray was done and showed cardiomegaly with vascular congestion and probable mild interstitial edema. Lower lobe airspace opacities are unchanged. IV Lasix 40 mg x 1 was given.  We shall continue to monitor patient and treat accordingly.

## 2021-12-13 NOTE — Inpatient Diabetes Management (Addendum)
Inpatient Diabetes Program Recommendations  AACE/ADA: New Consensus Statement on Inpatient Glycemic Control (2015)  Target Ranges:  Prepandial:   less than 140 mg/dL      Peak postprandial:   less than 180 mg/dL (1-2 hours)      Critically ill patients:  140 - 180 mg/dL   Lab Results  Component Value Date   GLUCAP 192 (H) 12/13/2021   HGBA1C 6.3 (H) 12/08/2021    Review of Glycemic Control  Latest Reference Range & Units 12/12/21 07:30 12/12/21 11:48 12/12/21 17:01 12/12/21 20:17 12/13/21 03:03 12/13/21 07:43  Glucose-Capillary 70 - 99 mg/dL 211 (H) 221 (H) 270 (H) 238 (H) 210 (H) 192 (H)   Diabetes history: DM 2 Outpatient Diabetes medications: Amaryl 4 mg Daily, Lantus 20 units bid, Janumet 50-1000 mg bid Current orders for Inpatient glycemic control:  Semglee 55 units Daily Novolog 18 units tid meal coverage Novolog 0-15 units tid  Glucerna tid between meals Solumedrol 80 mg Q8 hours  Inpatient Diabetes Program Recommendations:    Note: pt not meeting parameters to receive Novolog meal coverage insulin. If steroid dose remains the same, please consider:  -  Increasing Semglee to 60 units  May need to d/c Novolog meal coverage if PO intake does not improve.  Thanks,  Tama Headings RN, MSN, BC-ADM Inpatient Diabetes Coordinator Team Pager [phone] (8a-5p)

## 2021-12-13 NOTE — Progress Notes (Signed)
Daily Progress Note   Patient Name: Michaela Moon       Date: 12/13/2021 DOB: 1935-09-10  Age: 86 y.o. MRN#: [id number] Attending Physician: Orson Eva, MD Primary Care Physician: [name], MD Admit Date: 12/07/2021 Length of Stay: 6 days  Reason for Consultation/Follow-up: Establishing goals of care  HPI/Patient Profile:  86 y.o. female  with past medical history of hypertension, T2DM, COPD, dysphagia requiring dilation who presents to the emergency department due to shortness of breath.     She was recently admitted 10/4-10/10 for acute on chronic hypoxemic respiratory failure secondary to multifocal pneumonia treated with IV Rocephin and azithromycin.  She was discharged home and shortly thereafter became hypoxic despite 5 L/min nasal cannula (baseline is 2 L/min) and subsequently brought to the ED. She was admitted on 12/07/2021 with acute on chronic respiratory failure with hypoxia, multifocal pneumonia, lactic acidosis, dysphagia, and others.  Subjective:   Subjective: Chart Reviewed. Updates received. Patient Assessed. Created space and opportunity for patient  and family to explore thoughts and feelings regarding current medical situation.  Today's Discussion: Today met with the patient at the bedside, she is sitting in the bedside chair.  She is again more awake and interactive yesterday.  Per nursing staff she is currently on 15 L regular oxygen, no longer needing heated high flow.  She states that she feels a lot better.  She is not having any dyspnea currently.  She also denies pain, nausea, vomiting.  Her vitals are stable, sats currently 96%.  Follows visiting the patient's son was asleep in the visitors chair.  The patient discussed how blessed she is to have her son there, states that he has been there almost around-the-clock.  I declined to wake him up but encouraged her to encourage him to make sure he is taking care of himself as well.  She agreed.  I discussed how  happy it makes me to see that she is making steady progress.  I confirmed remain DNR, continue to treat the treatable.  Hopeful for improvement and transferred to floor and eventual discharge back home.  I provided emotional and general support through therapeutic listening, empathy, sharing of stories, and other techniques. I answered all questions and addressed all concerns to the best of my ability.  Review of Systems  Constitutional:        Denies pain in general  Respiratory:  Positive for shortness of breath (improved).   Gastrointestinal:  Negative for abdominal pain, nausea and vomiting.    Objective:   Vital Signs:  BP (!) 142/69   Pulse 100   Temp 98.4 F (36.9 C) (Oral)   Resp 16   Ht 5' 5"  (1.651 m)   Wt 82.3 kg   SpO2 91%   BMI 30.19 kg/m   Physical Exam: Physical Exam Vitals and nursing note reviewed.  Constitutional:      General: She is not in acute distress.    Appearance: She is ill-appearing.  HENT:     Head: Normocephalic and atraumatic.  Cardiovascular:     Rate and Rhythm: Normal rate.  Pulmonary:     Effort: Pulmonary effort is normal. No respiratory distress.  Abdominal:     General: Abdomen is flat.     Palpations: Abdomen is soft.  Skin:    General: Skin is warm and dry.  Neurological:     General: No focal deficit present.     Mental Status: She is alert.  Psychiatric:  Mood and Affect: Mood normal.        Behavior: Behavior normal.     Palliative Assessment/Data: 50%   Assessment & Plan:   Impression: Present on Admission:  Acute on chronic respiratory failure with hypoxia (HCC)  GERD (gastroesophageal reflux disease)  Acute exacerbation of chronic obstructive pulmonary disease (COPD) (HCC)  Essential hypertension  Mixed hyperlipidemia  Multifocal pneumonia  Lactic acidosis  Iron deficiency anemia  Hypoalbuminemia due to protein-calorie malnutrition (HCC)  Elevated brain natriuretic peptide (BNP) level  86 year old  female with repeated admissions for pneumonia, possible aspiration with recommended dysphagia 3 diet.  She has required increasing levels of oxygen but is steadily improved, now off heated high flow and on 15 L/min regular oxygen.  Overall long-term prognosis remains poor, but more optimistic for survival of this hospital stay.  SUMMARY OF RECOMMENDATIONS   Remain DNR Continue to treat the treatable Further goals of care discussions pending evolution of clinical picture Continued patient and family emotional support PMT will continue to follow, will follow-up in 2 days for progress  Symptom Management:  Per primary team PMT is available to assist as needed  Code Status: DNR  Prognosis: Unable to determine  Discharge Planning: To Be Determined  Discussed with: Patient, family, medical team, nursing team  Thank you for allowing [place] to participate in the care of ARIANNA HAYDON PMT will continue to support holistically.  Billing based on MDM: High  Problems Addressed: One acute or chronic illness or injury that poses a threat to life or bodily function  Amount and/or Complexity of Data: Category 3:Discussion of management or test interpretation with external physician/other qualified health care professional/appropriate source (not separately reported)  Risks: Decision not to resuscitate or to de-escalate care because of poor prognosis (confirmed continued DNR desire)   Walden Field, NP Palliative Medicine Team  Team Phone # [phone] (Nights/Weekends)  10/24/2020, 8:17 AM

## 2021-12-14 DIAGNOSIS — J189 Pneumonia, unspecified organism: Secondary | ICD-10-CM | POA: Diagnosis not present

## 2021-12-14 DIAGNOSIS — J441 Chronic obstructive pulmonary disease with (acute) exacerbation: Secondary | ICD-10-CM | POA: Diagnosis not present

## 2021-12-14 DIAGNOSIS — J9621 Acute and chronic respiratory failure with hypoxia: Secondary | ICD-10-CM | POA: Diagnosis not present

## 2021-12-14 DIAGNOSIS — R1319 Other dysphagia: Secondary | ICD-10-CM | POA: Diagnosis not present

## 2021-12-14 LAB — BASIC METABOLIC PANEL
Anion gap: 7 (ref 5–15)
BUN: 39 mg/dL — ABNORMAL HIGH (ref 8–23)
CO2: 29 mmol/L (ref 22–32)
Calcium: 10.8 mg/dL — ABNORMAL HIGH (ref 8.9–10.3)
Chloride: 102 mmol/L (ref 98–111)
Creatinine, Ser: 0.7 mg/dL (ref 0.44–1.00)
GFR, Estimated: >60 mL/min (ref >60)
Glucose, Bld: 106 mg/dL — ABNORMAL HIGH (ref 70–99)
Potassium: 4.4 mmol/L (ref 3.5–5.1)
Sodium: 138 mmol/L (ref 135–145)

## 2021-12-14 LAB — GLUCOSE, CAPILLARY
Glucose-Capillary: 97 mg/dL (ref 70–99)
Glucose-Capillary: 125 mg/dL — ABNORMAL HIGH (ref 70–99)
Glucose-Capillary: 162 mg/dL — ABNORMAL HIGH (ref 70–99)
Glucose-Capillary: 190 mg/dL — ABNORMAL HIGH (ref 70–99)
Glucose-Capillary: 130 mg/dL — ABNORMAL HIGH (ref 70–99)
Glucose-Capillary: 235 mg/dL — ABNORMAL HIGH (ref 70–99)

## 2021-12-14 LAB — CBC
HCT: 33.3 % — ABNORMAL LOW (ref 36.0–46.0)
Hemoglobin: 10.1 g/dL — ABNORMAL LOW (ref 12.0–15.0)
MCH: 25.1 pg — ABNORMAL LOW (ref 26.0–34.0)
MCHC: 30.3 g/dL (ref 30.0–36.0)
MCV: 82.6 fL (ref 80.0–100.0)
Platelets: 185 10*3/uL (ref 150–400)
RBC: 4.03 MIL/uL (ref 3.87–5.11)
RDW: 27 % — ABNORMAL HIGH (ref 11.5–15.5)
WBC: 14.1 10*3/uL — ABNORMAL HIGH (ref 4.0–10.5)
nRBC: 0 % (ref 0.0–0.2)

## 2021-12-14 LAB — MAGNESIUM: Magnesium: 2 mg/dL (ref 1.7–2.4)

## 2021-12-14 MED ORDER — FUROSEMIDE 10 MG/ML IJ SOLN
40.0000 mg | Freq: Once | INTRAMUSCULAR | Status: AC
  Administered 2021-12-14: 40 mg via INTRAVENOUS
  Filled 2021-12-14: qty 4

## 2021-12-14 NOTE — Care Management Important Message (Signed)
Important Message  Patient Details  Name: Michaela Moon MRN: [id number] Date of Birth: [birth year]   Medicare Important Message Given:  Yes     Tommy Medal 12/14/2021, 5:18 PM

## 2021-12-14 NOTE — Progress Notes (Signed)
PROGRESS NOTE  JANAH MCCULLOH KVQ:[id number] DOB: 11/13/1935 DOA: 12/07/2021 PCP: [name], MD  Brief History:  86 y.o. female with medical history significant of hypertension, T2DM, COPD, dysphagia requiring dilation who presents to the emergency department due to shortness of breath. She was recently admitted from 10/4 to 10/10 due to acute on chronic hypoxemic respiratory failure secondary to multifocal pneumonia which was treated with IV Rocephin and azithromycin.  She completed a course of antibiotics while still in the hospital and O2 sat was weaned down to 4 to 5 L via [place].  It appeared that patient was on supplemental oxygen at 2 LPM on returning home after discharge.  Home oxygen was being monitored at home and when the home health nurse went to see patient yesterday, she was slightly hypoxic, home oxygen was monitored within the past 24 hours and was noted to have dropped down into the low 80s and the oxygen was increased to 5 L/min via [place], despite this, patient was still hypoxic with an O2 sat of 82% on arrival to the ED Patient was also recently admitted to [facility] from 9/2 to 9/12 due to multifocal pneumonia and acute respiratory failure with hypoxia due to COVID-19 during which she was transferred to ICU and she required BiPAP, she was treated with Paxlovid for 5 days and she continued Decadron and empiric antibiotic.  She was discharged with supplemental oxygen via [place] at 2 LPM   At bedside, patient was requiring supplemental oxygen via HFNC at 15 L, she was not in any acute distress.  There was concern regarding food getting stuck in her throat/chest.   ED Course:  In the emergency department, she was intermittently tachypneic, BP was 131/57, temperature 97.90F, pulse 100 bpm, O2 sat 95% on NRB.  Work-up in the ED showed WBC 15.3, hemoglobin 8.8, hematocrit 32.8, MCV 82.2, platelets 208.  BMP showed sodium 139, potassium 3.7, chloride 107, bicarb 24, glucose 88, BUN  11, creatinine 0.77.  Lactic acid 2.8 > 2.3, BNP 116 (this was 329 about 9 days ago).  Influenza A, B, SARS coronavirus 2 was negative.  Blood culture was pending.  Chest x-ray showed Interval partial clearing of bibasilar airspace opacities, most consistent with improving pneumonia. Solu-Medrol 25 mg x 1 was given, she was started on IV ceftriaxone and azithromycin, IV hydration was provided.  Hospitalist was asked to admit patient for further evaluation and management. She finished 5 days of ceftriaxone and azithromycin.  She continued to have high oxygenation requirements up to 30L HFNC at 50%.  PCCM was consulted to assist.  The patient was continued on IV solumedrol. There was a component of fluid overload.  The patient was started on IV lasix.  She began to slowly improve with decreasing oxygenation demand.     Assessment/Plan: Acute on chronic respiratory failure with hypoxia O2 sat dropped to 80s on supplemental oxygen via [place] at 5 LPM and required up to [facility] at 30 L/min at 100% to maintain sats Goal pulse ox is >87% no need for pulse ox to be 100%  On 2L at home Weaning to 20L at 50% FiO2>>8L Lasix 40 mg IV x 1 on 10/18, 10/19, and 10/20 10/20 discussed with Dr. Elsworth Soho   Acute exacerbation of COPD 10/14 CTA chest--negative for PE but reports concern for atypical/noninfectious pneumonia--interstitial & alveolar opacities Appreciate pulmonology recommendations.  Continue duo nebs, Mucinex, Solu-Medrol, Singulair  Finished 5 days ceftriaxone/azithromycin. Continue  Protonix to prevent steroid-induced ulcer Continue incentive spirometry and flutter valve 12/08/21 TTE: LVEF 60-65% , no WMA, G1 DD, normal RVF 10/19--weaning steroids   Lobar pneumonia  Chest CTA--no PE; concern for noninfectious/atypical pneumonia  Consulted pulmonology on 10/16.  Procalcitonin <0.10 Patient was started on ceftriaxone and azithromycin Continue Tylenol as needed Continue Mucinex, incentive spirometry,  flutter valve    Bilateral Calf DVT Confirmed by venous US 10/14 TED  [facility] Apixaban ordered dosed per pharm D  PE was ruled out by CTA  -may need to hold apixaban if hemoptysis worsens -Hgb stable   Lactic acidosis  -from hypoxia Lactic acid 2.8 > 2.3 trended down   Dysphagia Last EGD on 04/2016 by Dr. Oneida Alar  showed no endoscopic esophageal abnormality to explain patient's dysphagia. Esophagus dilated per medical record Seen by speech--dys 3 with thin   T2DM with steroid induced hyperglycemia  12/08/21 A1C--6.3 Continue ISS and hypoglycemia protocol Increase Semglee 60 units plus novolog 18 units TID with meals, plus supplemental coverage and frequent CBG monitoring   Iron deficiency anemia Continue ferrous sulfate   Essential hypertension Continue losartan, Lopressor, add hydralazine    Mixed hyperlipidemia Continue Lipitor   GERD Continue Protonix   Peripheral neuropathy Continue Neurontin     Family Communication:  son at bedside 10/20   Consultants:  pulm   Code Status:  DNR   DVT Prophylaxis:  apixaban     Procedures: As Listed in Progress Note Above   Antibiotics: Ceftriaxone 10/13>>10/17 Azithro 10/13>>10/17        Subjective: Patient denies fevers, chills, headache, chest pain, nausea, vomiting, diarrhea, abdominal pain, dysuria, hematuria, hematochezia, and melena.   Objective: Vitals:   12/14/21 1100 12/14/21 1200 12/14/21 1201 12/14/21 1220  BP: (!) 125/43 (!) 140/43    Pulse: 71  74   Resp: (!) 21  (!) 22   Temp:    98.2 F (36.8 C)  TempSrc:    Oral  SpO2: 92%  91%   Weight:      Height:        Intake/Output Summary (Last 24 hours) at 12/14/2021 1347 Last data filed at 12/14/2021 0300 Gross per 24 hour  Intake 99.28 ml  Output 500 ml  Net -400.72 ml   Weight change:  Exam:  General:  Pt is alert, follows commands appropriately, not in acute distress HEENT: No icterus, No thrush, No neck mass, [place]/AT Cardiovascular:  RRR, S1/S2, no rubs, no gallops Respiratory: scattered bilateral rhonchi. Abdomen: Soft/+BS, non tender, non distended, no guarding Extremities: trace LE edema, No lymphangitis, No petechiae, No rashes, no synovitis   Data Reviewed: I have personally reviewed following labs and imaging studies Basic Metabolic Panel: Recent Labs  Lab 12/08/21 0344 12/09/21 0343 12/10/21 0418 12/11/21 0308 12/13/21 0308 12/14/21 0318  NA 138 138 137 137 136 138  K 4.5 4.9 4.8 4.9 4.3 4.4  CL 106 104 104 104 103 102  CO2 '25 28 27 25 27 29  '$ GLUCOSE 292* 297* 196* 245* 210* 106*  BUN '12 15 16 19 '$ 29* 39*  CREATININE 0.68 0.70 0.60 0.55 0.58 0.70  CALCIUM 10.1 10.1 10.1 10.3 10.4* 10.8*  MG 1.5* 1.8 2.0 1.7 1.7 2.0  PHOS 3.9  --   --  2.8  --   --    Liver Function Tests: Recent Labs  Lab 12/07/21 1458 12/08/21 0344  AST 32 23  ALT 34 26  ALKPHOS 52 45  BILITOT 0.4 0.5  PROT 7.2 6.4*  ALBUMIN 3.3* 2.8*   No results for input(s): "LIPASE", "AMYLASE" in the last 168 hours. No results for input(s): "AMMONIA" in the last 168 hours. Coagulation Profile: Recent Labs  Lab 12/07/21 1458  INR 1.1   CBC: Recent Labs  Lab 12/07/21 1458 12/08/21 0344 12/09/21 0343 12/10/21 0418 12/11/21 0308 12/12/21 0324 12/14/21 0318  WBC 15.3*   < > 12.1* 11.4* 11.9* 14.1* 14.1*  NEUTROABS 10.6*  --   --   --  10.1*  --   --   HGB 9.8*   < > 8.5* 8.7* 9.3* 9.4* 10.1*  HCT 32.8*   < > 28.8* 29.3* 31.3* 31.5* 33.3*  MCV 82.2   < > 82.1 81.4 83.2 82.9 82.6  PLT 208   < > 170 166 168 177 185   < > = values in this interval not displayed.   Cardiac Enzymes: No results for input(s): "CKTOTAL", "CKMB", "CKMBINDEX", "TROPONINI" in the last 168 hours. BNP: Invalid input(s): "POCBNP" CBG: Recent Labs  Lab 12/13/21 2013 12/14/21 0243 12/14/21 0430 12/14/21 0739 12/14/21 1149  GLUCAP 162* 97 125* 162* 190*   HbA1C: No results for input(s): "HGBA1C" in the last 72 hours. Urine analysis: No  results found for: "COLORURINE", "APPEARANCEUR", "LABSPEC", "PHURINE", "GLUCOSEU", "HGBUR", "BILIRUBINUR", "KETONESUR", "PROTEINUR", "UROBILINOGEN", "NITRITE", "LEUKOCYTESUR" Sepsis Labs: '@LABRCNTIP'$ (procalcitonin:4,lacticidven:4) ) Recent Results (from the past 240 hour(s))  Resp Panel by RT-PCR (Flu A&B, Covid) Anterior Nasal Swab     Status: None   Collection Time: 12/07/21  2:58 PM   Specimen: Anterior Nasal Swab  Result Value Ref Range Status   SARS Coronavirus 2 by RT PCR NEGATIVE NEGATIVE Final    Comment: (NOTE) SARS-CoV-2 target nucleic acids are NOT DETECTED.  The SARS-CoV-2 RNA is generally detectable in upper respiratory specimens during the acute phase of infection. The lowest concentration of SARS-CoV-2 viral copies this assay can detect is 138 copies/mL. A negative result does not preclude SARS-Cov-2 infection and should not be used as the sole basis for treatment or other patient management decisions. A negative result may occur with  improper specimen collection/handling, submission of specimen other than nasopharyngeal swab, presence of viral mutation(s) within the areas targeted by this assay, and inadequate number of viral copies(<138 copies/mL). A negative result must be combined with clinical observations, patient history, and epidemiological information. The expected result is Negative.  Fact Sheet for Patients:  EntrepreneurPulse.com.au  Fact Sheet for Healthcare Providers:  IncredibleEmployment.be  This test is no t yet approved or cleared by the [place] FDA and  has been authorized for detection and/or diagnosis of SARS-CoV-2 by FDA under an Emergency Use Authorization (EUA). This EUA will remain  in effect (meaning this test can be used) for the duration of the COVID-19 declaration under Section 564(b)(1) of the Act, 21 U.S.C.section 360bbb-3(b)(1), unless the authorization is terminated  or revoked sooner.        Influenza A by PCR NEGATIVE NEGATIVE Final   Influenza B by PCR NEGATIVE NEGATIVE Final    Comment: (NOTE) The Xpert Xpress SARS-CoV-2/FLU/RSV plus assay is intended as an aid in the diagnosis of influenza from Nasopharyngeal swab specimens and should not be used as a sole basis for treatment. Nasal washings and aspirates are unacceptable for Xpert Xpress SARS-CoV-2/FLU/RSV testing.  Fact Sheet for Patients: EntrepreneurPulse.com.au  Fact Sheet for Healthcare Providers: IncredibleEmployment.be  This test is not yet approved or cleared by the [place] FDA and has been authorized for detection and/or diagnosis of SARS-CoV-2 by FDA under  an Emergency Use Authorization (EUA). This EUA will remain in effect (meaning this test can be used) for the duration of the COVID-19 declaration under Section 564(b)(1) of the Act, 21 U.S.C. section 360bbb-3(b)(1), unless the authorization is terminated or revoked.  Performed at [facility], [address], [place], [place] [address]   Blood Culture (routine x 2)     Status: None   Collection Time: 12/07/21  2:58 PM   Specimen: BLOOD RIGHT FOREARM  Result Value Ref Range Status   Specimen Description BLOOD RIGHT FOREARM  Final   Special Requests   Final    BOTTLES DRAWN AEROBIC AND ANAEROBIC Blood Culture adequate volume   Culture   Final    NO GROWTH 5 DAYS Performed at [facility], [address] [place], [place] [address]    Report Status 12/12/2021 FINAL  Final  Blood Culture (routine x 2)     Status: None   Collection Time: 12/07/21  3:03 PM   Specimen: Right Antecubital; Blood  Result Value Ref Range Status   Specimen Description RIGHT ANTECUBITAL  Final   Special Requests   Final    BOTTLES DRAWN AEROBIC AND ANAEROBIC Blood Culture results may not be optimal due to an inadequate volume of blood received in culture bottles   Culture   Final    NO GROWTH 5 DAYS Performed at [facility], [address], [place], [place] [address]    Report Status 12/12/2021 FINAL  Final  MRSA Next Gen by PCR, Nasal     Status: None   Collection Time: 12/10/21  5:50 PM   Specimen: Nasal Mucosa; Nasal Swab  Result Value Ref Range Status   MRSA by PCR Next Gen NOT DETECTED NOT DETECTED Final    Comment: (NOTE) The GeneXpert MRSA Assay (FDA approved for NASAL specimens only), is one component of a comprehensive MRSA colonization surveillance program. It is not intended to diagnose MRSA infection nor to guide or monitor treatment for MRSA infections. Test performance is not FDA approved in patients less than 48 years old. Performed at [facility], [address], [place], [place] [address]      Scheduled Meds:  apixaban  10 mg Oral BID   Followed by   Derrill Memo ON 12/15/2021] apixaban  5 mg Oral BID   aspirin  81 mg Oral Daily   atorvastatin  80 mg Oral Daily   Chlorhexidine Gluconate Cloth  6 each Topical Q0600   docusate sodium  100 mg Oral Daily   feeding supplement (GLUCERNA SHAKE)  237 mL Oral TID BM   ferrous sulfate  325 mg Oral Daily   gabapentin  100 mg Oral TID   hydrALAZINE  50 mg Oral Q8H   insulin aspart  0-15 Units Subcutaneous TID WC   insulin aspart  18 Units Subcutaneous TID WC   insulin glargine-yfgn  60 Units Subcutaneous Daily   ipratropium-albuterol  3 mL Nebulization Q6H   losartan  100 mg Oral Daily   methylPREDNISolone (SOLU-MEDROL) injection  80 mg Intravenous Q12H   metoprolol tartrate  50 mg Oral BID   mometasone-formoterol  2 puff Inhalation BID   montelukast  10 mg Oral QHS   pantoprazole  40 mg Oral Daily   polyvinyl alcohol  1 drop Both Eyes QID   umeclidinium bromide  1 puff Inhalation Daily   Continuous Infusions:  Procedures/Studies: DG Chest 1 View  Result Date: 12/13/2021 CLINICAL DATA:  Shortness of breath EXAM: CHEST  1 VIEW COMPARISON:  12/11/2021 FINDINGS: Mild cardiomegaly and vascular congestion. Mild interstitial prominence and  lower lobe airspace opacities. Findings similar to prior study. No visible effusions. No acute bony abnormality. Chronic healed proximal left humeral fracture. IMPRESSION: Cardiomegaly with vascular congestion and probable mild interstitial edema. Lower lobe airspace opacities are unchanged. Electronically Signed   By: Rolm Baptise M.D.   On: 12/13/2021 01:04   DG Chest Port 1 View  Result Date: 12/11/2021 CLINICAL DATA:  5626 with acute respiratory failure. EXAM: PORTABLE CHEST 1 VIEW COMPARISON:  12/08/2021 portable chest and chest CT. FINDINGS: 4:49 a.m. There is cardiomegaly again noted and mild central vascular prominence, with aortic atherosclerosis. Stable mediastinum. Interstitial and patchy alveolar opacities in the mid to lower lung fields continue to be seen, with a basal gradient. There are minimal pleural effusions. There is improvement in opacities in the base of both lungs but significant opacity remains and is otherwise unaltered. The upper 1/3 of the lungs remain generally clear. There is osteopenia and degenerative change thoracic spine, chronic healed fracture deformity proximal left humerus. IMPRESSION: Improved aeration at the base of both lungs. No other noteworthy change in the bilateral lung opacities and underlying interstitial prominence. Cardiomegaly. Electronically Signed   By: Telford Nab M.D.   On: 12/11/2021 06:29   DG CHEST PORT 1 VIEW  Result Date: 12/08/2021 CLINICAL DATA:  Respiratory failure EXAM: PORTABLE CHEST 1 VIEW COMPARISON:  12/07/2021 FINDINGS: No significant change in AP portable chest radiograph. Cardiomegaly with bilateral interstitial and heterogeneous airspace opacity of the lung bases, and probable small layering pleural effusions. Partially imaged chronic fracture deformity of the proximal left humerus. IMPRESSION: No significant change in AP portable chest radiograph. Cardiomegaly with bilateral interstitial and heterogeneous airspace opacity of the  lung bases, and probable small layering pleural effusions. Electronically Signed   By: Delanna Ahmadi M.D.   On: 12/08/2021 16:16   ECHOCARDIOGRAM COMPLETE  Result Date: 12/08/2021    ECHOCARDIOGRAM REPORT   Patient Name:   RAYLEA ADCOX Date of Exam: 12/08/2021 Medical Rec #:  [id number]       Height:       65.0 in Accession #:    [id number]      Weight:       178.8 lb Date of Birth:  [birth year]        BSA:          1.886 m Patient Age:    57 years        BP:           160/42 mmHg Patient Gender: F               HR:           68 bpm. Exam Location:  [facility] Procedure: 2D Echo, Cardiac Doppler and Color Doppler Indications:    CHF  History:        Patient has no prior history of Echocardiogram examinations.                 COPD; Risk Factors:Hypertension, Diabetes and Dyslipidemia.  Sonographer:    Wenda Low Referring Phys: [id number] OLADAPO ADEFESO IMPRESSIONS  1. Left ventricular ejection fraction, by estimation, is 60 to 65%. The left ventricle has normal function. The left ventricle has no regional wall motion abnormalities. The left ventricular internal cavity size was mildly dilated. There is mild left ventricular hypertrophy of the septal segment. Left ventricular diastolic parameters are consistent with Grade I diastolic dysfunction (impaired relaxation).  2.  Right ventricular systolic function is normal. The right ventricular size is normal. There is normal pulmonary artery systolic pressure.  3. The mitral valve is grossly normal. No evidence of mitral valve regurgitation.  4. The aortic valve is tricuspid. Aortic valve regurgitation is not visualized. No aortic stenosis is present.  5. The inferior vena cava is normal in size with greater than 50% respiratory variability, suggesting right atrial pressure of 3 mmHg. Comparison(s): No prior Echocardiogram. FINDINGS  Left Ventricle: Left ventricular ejection fraction, by estimation, is 60 to 65%. The left ventricle has normal function. The left  ventricle has no regional wall motion abnormalities. The left ventricular internal cavity size was mildly dilated. There is  mild left ventricular hypertrophy of the septal segment. Left ventricular diastolic parameters are consistent with Grade I diastolic dysfunction (impaired relaxation). Right Ventricle: The right ventricular size is normal. No increase in right ventricular wall thickness. Right ventricular systolic function is normal. There is normal pulmonary artery systolic pressure. The tricuspid regurgitant velocity is 2.06 m/s, and  with an assumed right atrial pressure of 3 mmHg, the estimated right ventricular systolic pressure is 24.0 mmHg. Left Atrium: Left atrial size was normal in size. Right Atrium: Right atrial size was normal in size. Pericardium: There is no evidence of pericardial effusion. Mitral Valve: The mitral valve is grossly normal. Mild mitral annular calcification. No evidence of mitral valve regurgitation. MV peak gradient, 4.9 mmHg. The mean mitral valve gradient is 2.0 mmHg. Tricuspid Valve: The tricuspid valve is normal in structure. Tricuspid valve regurgitation is not demonstrated. No evidence of tricuspid stenosis. Aortic Valve: The aortic valve is tricuspid. There is mild aortic valve annular calcification. Aortic valve regurgitation is not visualized. No aortic stenosis is present. Aortic valve mean gradient measures 6.0 mmHg. Aortic valve peak gradient measures 12.1 mmHg. Aortic valve area, by VTI measures 2.03 cm. Pulmonic Valve: The pulmonic valve was normal in structure. Pulmonic valve regurgitation is not visualized. No evidence of pulmonic stenosis. Aorta: The aortic root is normal in size and structure. Venous: The inferior vena cava is normal in size with greater than 50% respiratory variability, suggesting right atrial pressure of 3 mmHg. IAS/Shunts: No atrial level shunt detected by color flow Doppler.  LEFT VENTRICLE PLAX 2D LVIDd:         5.30 cm   Diastology  LVIDs:         3.20 cm   LV e' medial:    10.00 cm/s LV PW:         1.20 cm   LV E/e' medial:  7.4 LV IVS:        1.00 cm   LV e' lateral:   8.05 cm/s LVOT diam:     1.90 cm   LV E/e' lateral: 9.2 LV SV:         77 LV SV Index:   41 LVOT Area:     2.84 cm  RIGHT VENTRICLE RV Basal diam:  3.30 cm RV Mid diam:    2.40 cm RV S prime:     15.70 cm/s TAPSE (M-mode): 3.0 cm LEFT ATRIUM             Index        RIGHT ATRIUM           Index LA diam:        4.00 cm 2.12 cm/m   RA Area:     14.80 cm LA Vol (A2C):   52.9 ml 28.05 ml/m  RA  Volume:   38.50 ml  20.41 ml/m LA Vol (A4C):   54.0 ml 28.63 ml/m LA Biplane Vol: 55.5 ml 29.43 ml/m  AORTIC VALVE                     PULMONIC VALVE AV Area (Vmax):    1.66 cm      PV Vmax:       1.05 m/s AV Area (Vmean):   1.69 cm      PV Peak grad:  4.4 mmHg AV Area (VTI):     2.03 cm AV Vmax:           174.00 cm/s AV Vmean:          118.000 cm/s AV VTI:            0.380 m AV Peak Grad:      12.1 mmHg AV Mean Grad:      6.0 mmHg LVOT Vmax:         102.00 cm/s LVOT Vmean:        70.400 cm/s LVOT VTI:          0.272 m LVOT/AV VTI ratio: 0.72  AORTA Ao Root diam: 3.20 cm MITRAL VALVE                TRICUSPID VALVE MV Area (PHT): 2.92 cm     TR Peak grad:   17.0 mmHg MV Area VTI:   2.38 cm     TR Vmax:        206.00 cm/s MV Peak grad:  4.9 mmHg MV Mean grad:  2.0 mmHg     SHUNTS MV Vmax:       1.11 m/s     Systemic VTI:  0.27 m MV Vmean:      63.0 cm/s    Systemic Diam: 1.90 cm MV Decel Time: 260 msec MV E velocity: 73.70 cm/s MV A velocity: 104.00 cm/s MV E/A ratio:  0.71 Rudean Haskell MD Electronically signed by Rudean Haskell MD Signature Date/Time: 12/08/2021/2:14:12 PM    Final    CT Angio Chest Pulmonary Embolism (PE) W or WO Contrast  Result Date: 12/08/2021 CLINICAL DATA:  Positive D-dimer.  DVT EXAM: CT ANGIOGRAPHY CHEST WITH CONTRAST TECHNIQUE: Multidetector CT imaging of the chest was performed using the standard protocol during bolus administration of  intravenous contrast. Multiplanar CT image reconstructions and MIPs were obtained to evaluate the vascular anatomy. RADIATION DOSE REDUCTION: This exam was performed according to the departmental dose-optimization program which includes automated exposure control, adjustment of the mA and/or kV according to patient size and/or use of iterative reconstruction technique. CONTRAST:  77m OMNIPAQUE IOHEXOL 350 MG/ML SOLN COMPARISON:  11/28/2021 FINDINGS: Cardiovascular: Satisfactory opacification of the pulmonary arteries to the segmental level. No evidence of pulmonary embolism. Normal heart size. No pericardial effusion. Extensive atheromatous calcification of the aorta and coronaries Mediastinum/Nodes: Negative for adenopathy. Lungs/Pleura: Unchanged pattern of multifocal consolidation affecting all lobes with ground-glass, nodular, and consolidative opacities. Lower lobe airspace opacity is new/progressed from July 2023 but remaining opacity is very similar. Central airways are clear. Mild emphysematous change. In an otherwise clear area of lung there is an 8 mm irregular pulmonary nodule which is known from prior CTs. Upper Abdomen: Atheromatous plaque. Dystrophic type calcification in the right posterior liver. Granulomatous calcifications in the spleen. Musculoskeletal: No acute finding. Generalized thoracic spine degeneration. Review of the MIP images confirms the above findings. IMPRESSION: 1. Negative for pulmonary embolism. 2. Multi lobar airspace disease  much of which is chronic when compared to a July 2023 CT. Lingular opacity is definitely chronic when compared to a January 2023 CT, and progressive. Consider atypical and non infectious pneumonias. Consider alveolar tumor. 3. Known spiculated nodule in the right upper lobe. Electronically Signed   By: Jorje Guild M.D.   On: 12/08/2021 12:02   US Venous Img Lower Bilateral (DVT)  Result Date: 12/08/2021 CLINICAL DATA:  Bilateral lower extremity  edema. EXAM: BILATERAL LOWER EXTREMITY VENOUS DOPPLER ULTRASOUND TECHNIQUE: Gray-scale sonography with graded compression, as well as color Doppler and duplex ultrasound were performed to evaluate the lower extremity deep venous systems from the level of the common femoral vein and including the common femoral, femoral, profunda femoral, popliteal and calf veins including the posterior tibial, peroneal and gastrocnemius veins when visible. The superficial great saphenous vein was also interrogated. Spectral Doppler was utilized to evaluate flow at rest and with distal augmentation maneuvers in the common femoral, femoral and popliteal veins. COMPARISON:  None Available. FINDINGS: RIGHT LOWER EXTREMITY Common Femoral Vein: No evidence of thrombus. Normal compressibility, respiratory phasicity and response to augmentation. Saphenofemoral Junction: No evidence of thrombus. Normal compressibility and flow on color Doppler imaging. Profunda Femoral Vein: No evidence of thrombus. Normal compressibility and flow on color Doppler imaging. Femoral Vein: No evidence of thrombus. Normal compressibility, respiratory phasicity and response to augmentation. Popliteal Vein: No evidence of thrombus. Normal compressibility, respiratory phasicity and response to augmentation. Calf Veins: Normal patency of posterior tibial vein. There is some visualized thrombus in the right peroneal vein which appears occlusive in the proximal calf. Gastrocnemius thrombus identified in the right calf. Superficial Great Saphenous Vein: No evidence of thrombus. Normal compressibility. Venous Reflux:  None. Other Findings: No evidence of superficial thrombophlebitis or abnormal fluid collection. LEFT LOWER EXTREMITY Common Femoral Vein: No evidence of thrombus. Normal compressibility, respiratory phasicity and response to augmentation. Saphenofemoral Junction: No evidence of thrombus. Normal compressibility and flow on color Doppler imaging. Profunda  Femoral Vein: No evidence of thrombus. Normal compressibility and flow on color Doppler imaging. Femoral Vein: No evidence of thrombus. Normal compressibility, respiratory phasicity and response to augmentation. Popliteal Vein: No evidence of thrombus. Normal compressibility, respiratory phasicity and response to augmentation. Calf Veins: Posterior tibial vein is normally patent. Left peroneal vein DVT visualized. Superficial Great Saphenous Vein: No evidence of thrombus. Normal compressibility. Venous Reflux:  None. Other Findings: No evidence of superficial thrombophlebitis or abnormal fluid collection. IMPRESSION: Positive for bilateral calf DVT in the peroneal veins and right gastrocnemius vein. Electronically Signed   By: Aletta Edouard M.D.   On: 12/08/2021 11:50   DG Chest Port 1 View  Result Date: 12/07/2021 CLINICAL DATA:  Questionable sepsis. Evaluate for abnormality. Hypoxemia. History of asthma/COPD. EXAM: PORTABLE CHEST 1 VIEW COMPARISON:  Radiographs 11/29/2021 and 11/28/2021.  CT 11/28/2021. FINDINGS: 1509 hours. The heart size and mediastinal contours appear stable. Compared with the most recent studies, there is partial clearing of the bibasilar airspace opacities, most consistent with improving pneumonia. No pneumothorax or significant pleural effusion identified. No acute osseous findings are seen. Telemetry leads overlie the chest. There is multilevel thoracic spondylosis. IMPRESSION: Interval partial clearing of bibasilar airspace opacities, most consistent with improving pneumonia. Continued follow-up recommended to document complete clearing. No new findings identified. Electronically Signed   By: Richardean Sale M.D.   On: 12/07/2021 15:27   DG Chest 1 View  Result Date: 11/29/2021 CLINICAL DATA:  Shortness of breath EXAM: CHEST  1 VIEW COMPARISON:  Chest x-ray 11/28/2021. FINDINGS: Cardiomediastinal silhouette is within normal limits. There is increasing patchy airspace disease in  the bilateral mid and lower lungs. There is no pleural effusion or pneumothorax. No acute fractures are seen. There is a healed left humeral neck fracture. IMPRESSION: Increasing patchy airspace disease in the bilateral mid and lower lungs, concerning for multifocal pneumonia. Electronically Signed   By: Ronney Asters M.D.   On: 11/29/2021 20:36   CT Angio Chest PE W and/or Wo Contrast  Result Date: 11/28/2021 CLINICAL DATA:  PE suspected.  Low O2 sats. EXAM: CT ANGIOGRAPHY CHEST WITH CONTRAST TECHNIQUE: Multidetector CT imaging of the chest was performed using the standard protocol during bolus administration of intravenous contrast. Multiplanar CT image reconstructions and MIPs were obtained to evaluate the vascular anatomy. RADIATION DOSE REDUCTION: This exam was performed according to the departmental dose-optimization program which includes automated exposure control, adjustment of the mA and/or kV according to patient size and/or use of iterative reconstruction technique. CONTRAST:  69m OMNIPAQUE IOHEXOL 350 MG/ML SOLN COMPARISON:  CT chest 09/20/2021 FINDINGS: Cardiovascular: Satisfactory opacification of the pulmonary arteries to the segmental level. No evidence of pulmonary embolism. Normal heart size. No pericardial effusion. Calcific aortic atherosclerosis. Multivessel coronary artery calcification. Mediastinum/Nodes: No enlarged mediastinal, hilar, or axillary lymph nodes. Thyroid gland and trachea demonstrate no significant findings. Patulous esophagus. Lungs/Pleura: A 1.0 x 0.8 cm irregular nodule in the anterior right upper lobe appears subsolid on today's exam (series 6, image 57). Irregular consolidation with air bronchograms and surrounding ground-glass opacities dependently located in the left upper lobe is not significantly changed compared to 09/20/2021. Airspace consolidation with air bronchograms dependently located in the right upper and right middle lobes with adjacent ground-glass  opacities are increased compared to 09/20/2021. Similarly, patchy consolidations with surrounding ground-glass opacities in the lower lungs are also increased compared to 09/16/2021, particularly in the right lower lobe. No pleural effusion or pneumothorax. Upper Abdomen: No acute abnormality. Benign left hepatic cyst again noted. No follow-up imaging indicated. Musculoskeletal: No chest wall abnormality. No acute or suspicious osseous findings. Multilevel degenerative changes in the thoracic and visualized upper lumbar spine. Review of the MIP images confirms the above findings. IMPRESSION: 1. No pulmonary embolism. 2. Multifocal, dependently located, consolidations bilaterally, favored to represent multifocal pneumonia, possibly aspiration related. Recommend follow-up CT imaging in 3 months after treatment to ensure resolution. 3. The 1 cm nodule in the anterior right upper lobe appears subsolid on today's exam. As previously noted, bronchogenic carcinoma cannot be excluded. Recommend attention on follow-up imaging. 4. Multivessel coronary artery disease. Aortic Atherosclerosis (ICD10-I70.0). Electronically Signed   By: LIleana RoupM.D.   On: 11/28/2021 16:15   DG Chest Port 1 View  Result Date: 11/28/2021 CLINICAL DATA:  Dyspnea, shaking, hypoxia, history COPD, asthma, diabetes mellitus, hypertension EXAM: PORTABLE CHEST 1 VIEW COMPARISON:  Portable exam 1304 hours compared to 10/31/2021 FINDINGS: Upper normal size of cardiac silhouette. Mediastinal contours and pulmonary vascularity normal. Atherosclerotic calcification aorta. Bibasilar infiltrates question multifocal pneumonia. Minimal pleural effusions. No pneumothorax. Bones demineralized with posttraumatic deformity proximal LEFT humerus and multilevel degenerative disc disease changes/scoliosis thoracic spine. IMPRESSION: Bibasilar pulmonary infiltrates question multifocal pneumonia. Minimal pleural effusions. Aortic Atherosclerosis (ICD10-I70.0).  Electronically Signed   By: MLavonia DanaM.D.   On: 11/28/2021 13:15    DOrson Eva DO  [facility]  If 7PM-7AM, please contact night-coverage [email or web address] Password [email or web address] 12/14/2021, 1:47 PM   LOS: 7 days

## 2021-12-14 NOTE — Progress Notes (Signed)
NAME:  Michaela Moon, MRN:  [id number], DOB:  1936-01-16, LOS: 7 ADMISSION DATE:  12/07/2021, CONSULTATION DATE:  12/14/2021  REFERRING MD:  Wynetta Emery, TRH, CHIEF COMPLAINT:  resp failure on HF [place]   History of Present Illness:  86 year old ex-smoker with COPD and chronic respiratory failure on 2 L of oxygen who was admitted for worsening hypoxia requiring up to 5 L of oxygen at home. Her oxygen requirements have increased starting on 15 L high flow in the emergency room and now up to 100% on 30 L heated high flow nasal cannula. Initial labs were significant for WBC count of 15.3, BNP 116, COVID testing was negative Chest x-ray showed partial clearing of bibasilar airspace disease.  Of note, she was hospitalized 9/2 to 9/12 and treated for COVID-19 pneumonia With Paxlovid, Decadron and empiric antibiotic discharge on 2 L She was again hospitalized 10/4 to 10/10 with multifocal pneumonia and treated with Rocephin and azithromycin, weaned down to 5 L nasal cannula  Pertinent  Medical History  hypertension,  T2DM,  dysphagia requiring dilation 04/2016  Significant Hospital Events: Including procedures, antibiotic start and stop dates in addition to other pertinent events   CT angiogram chest 12/08/2021 multilobar airspace disease with similar pattern compared to January 2023.  Lingular opacity appears chronic, lower lobe airspace disease is worse, right upper lobe spiculated nodule unchanged from prior  10/17 cleared bedside swallow evaluation   Interim History / Subjective:   Tolerated high flow nasal cannula since yesterday. Down to 8 L today Shows me sputum with blood tinge Afebrile  Objective   Blood pressure (!) 140/43, pulse 74, temperature 98.2 F (36.8 C), temperature source Oral, resp. rate (!) 22, height '5\' 5"'$  (1.651 m), weight 82.3 kg, SpO2 91 %.        Intake/Output Summary (Last 24 hours) at 12/14/2021 1415 Last data filed at 12/14/2021 0300 Gross per 24 hour   Intake 99.28 ml  Output 500 ml  Net -400.72 ml    Filed Weights   12/11/21 0500 12/12/21 0500 12/14/21 0300  Weight: 81.9 kg 82.3 kg 82.3 kg    Examination: General: Elderly woman, sitting up in bed, no distress HENT: Mild pallor, no icterus, no JVD Lungs: Scattered left crackles, no rhonchi, no accessory muscle use Cardiovascular: S1-S2 regular, no murmur Abdomen: Soft, obese, nontender, no hepatosplenomegaly Extremities: No significant edema, no deformity Neuro: Alert and interactive, nonfocal  Chest x-ray 10/19 independently reviewed shows cardiomegaly with pulmonary vascular congestion, unchanged lower lobe opacities  Labs show normal electrolytes, BNP 94, negative procalcitonin, mild leukocytosis  Resolved Hospital Problem list     Assessment & Plan:  Acute hypoxic respiratory failure Bilateral infiltrates -lingular patchy infiltrate dates back to January 2023 and both lower lobe infiltrates appear gradually worse compared to July 2023 on serial CT imaging  -Differential includes chronic aspiration versus inflammatory etiology, doubt malignancy.  Right upper lobe pulmonary nodule appears stable since January 2023  -Completed 5 days of ceftriaxone/azithromycin, MRSA PCR negative -Decreased IV Solu-Medrol 80 every 12, once FiO2 significantly decreased, can taper further to 40 every 12   Dysphagia -esophageal dilatation 2018 for?  Web. -Cleared bedside swallow evaluation, on dysphagia 3 diet  -plan for MBS once FiO2 requirements decrease and she can be safely transported  Uncontrolled type 2 diabetes -due to high-dose steroids -Increased insulin to Lantus 60  units plus NovoLog 18 units 3 times daily with meals -SSI coverage -Expect insulin requirements to decrease as steroids were tapered  Bilateral calf  DVT -apixaban started by [facility] , may have to hold if she has increased hemoptysis  Best Practice (right click and "Reselect all SmartList Selections" daily)    Diet/type: Regular consistency (see orders) DVT prophylaxis: DOAC GI prophylaxis: N/A Lines: N/A Foley:  N/A Code Status:  full code Last date of multidisciplinary goals of care discussion  10/17, DNR issued son updated at bedside daily  Labs   CBC: Recent Labs  Lab 12/07/21 1458 12/08/21 0344 12/09/21 0343 12/10/21 0418 12/11/21 0308 12/12/21 0324 12/14/21 0318  WBC 15.3*   < > 12.1* 11.4* 11.9* 14.1* 14.1*  NEUTROABS 10.6*  --   --   --  10.1*  --   --   HGB 9.8*   < > 8.5* 8.7* 9.3* 9.4* 10.1*  HCT 32.8*   < > 28.8* 29.3* 31.3* 31.5* 33.3*  MCV 82.2   < > 82.1 81.4 83.2 82.9 82.6  PLT 208   < > 170 166 168 177 185   < > = values in this interval not displayed.     Basic Metabolic Panel: Recent Labs  Lab 12/08/21 0344 12/09/21 0343 12/10/21 0418 12/11/21 0308 12/13/21 0308 12/14/21 0318  NA 138 138 137 137 136 138  K 4.5 4.9 4.8 4.9 4.3 4.4  CL 106 104 104 104 103 102  CO2 '25 28 27 25 27 29  '$ GLUCOSE 292* 297* 196* 245* 210* 106*  BUN '12 15 16 19 '$ 29* 39*  CREATININE 0.68 0.70 0.60 0.55 0.58 0.70  CALCIUM 10.1 10.1 10.1 10.3 10.4* 10.8*  MG 1.5* 1.8 2.0 1.7 1.7 2.0  PHOS 3.9  --   --  2.8  --   --     GFR: Estimated Creatinine Clearance: 55.5 mL/min (by C-G formula based on SCr of 0.7 mg/dL). Recent Labs  Lab 12/07/21 1458 12/07/21 1642 12/08/21 0344 12/09/21 0343 12/10/21 0418 12/11/21 0308 12/12/21 0324 12/13/21 0308 12/14/21 0318  PROCALCITON  --   --  <0.10  --   --   --   --  <0.10  --   WBC 15.3*  --  9.4   < > 11.4* 11.9* 14.1*  --  14.1*  LATICACIDVEN 2.8* 2.3*  --   --   --   --   --   --   --    < > = values in this interval not displayed.     Liver Function Tests: Recent Labs  Lab 12/07/21 1458 12/08/21 0344  AST 32 23  ALT 34 26  ALKPHOS 52 45  BILITOT 0.4 0.5  PROT 7.2 6.4*  ALBUMIN 3.3* 2.8*    No results for input(s): "LIPASE", "AMYLASE" in the last 168 hours. No results for input(s): "AMMONIA" in the last 168  hours.  ABG    Component Value Date/Time   PHART 7.44 12/07/2021 1518   PCO2ART 37 12/07/2021 1518   PO2ART 74 (L) 12/07/2021 1518   HCO3 25.1 12/07/2021 1518   O2SAT 95.6 12/07/2021 1518     Coagulation Profile: Recent Labs  Lab 12/07/21 1458  INR 1.1     Cardiac Enzymes: No results for input(s): "CKTOTAL", "CKMB", "CKMBINDEX", "TROPONINI" in the last 168 hours.  HbA1C: Hgb A1c MFr Bld  Date/Time Value Ref Range Status  12/08/2021 04:46 AM 6.3 (H) 4.8 - 5.6 % Final    Comment:    (NOTE) Pre diabetes:          5.7%-6.4%  Diabetes:              >  6.4%  Glycemic control for   <7.0% adults with diabetes   10/21/2017 11:18 AM 7.5 (H) 4.8 - 5.6 % Final    Comment:    (NOTE) Pre diabetes:          5.7%-6.4% Diabetes:              >6.4% Glycemic control for   <7.0% adults with diabetes     CBG: Recent Labs  Lab 12/13/21 2013 12/14/21 0243 12/14/21 0430 12/14/21 0739 12/14/21 1149  GLUCAP 162* 97 125* 162* [place] MD. FCCP. [facility] Pager : [phone]  If no response to pager , please call [phone] until 7 pm After 7:00 pm call Elink  [phone]   12/14/2021

## 2021-12-14 NOTE — Plan of Care (Signed)

## 2021-12-15 DIAGNOSIS — J189 Pneumonia, unspecified organism: Secondary | ICD-10-CM | POA: Diagnosis not present

## 2021-12-15 DIAGNOSIS — J9621 Acute and chronic respiratory failure with hypoxia: Secondary | ICD-10-CM | POA: Diagnosis not present

## 2021-12-15 DIAGNOSIS — J441 Chronic obstructive pulmonary disease with (acute) exacerbation: Secondary | ICD-10-CM | POA: Diagnosis not present

## 2021-12-15 LAB — GLUCOSE, CAPILLARY
Glucose-Capillary: 242 mg/dL — ABNORMAL HIGH (ref 70–99)
Glucose-Capillary: 209 mg/dL — ABNORMAL HIGH (ref 70–99)
Glucose-Capillary: 144 mg/dL — ABNORMAL HIGH (ref 70–99)
Glucose-Capillary: 178 mg/dL — ABNORMAL HIGH (ref 70–99)

## 2021-12-15 LAB — BASIC METABOLIC PANEL
Anion gap: 6 (ref 5–15)
BUN: 49 mg/dL — ABNORMAL HIGH (ref 8–23)
CO2: 27 mmol/L (ref 22–32)
Calcium: 10.2 mg/dL (ref 8.9–10.3)
Chloride: 100 mmol/L (ref 98–111)
Creatinine, Ser: 0.89 mg/dL (ref 0.44–1.00)
GFR, Estimated: >60 mL/min (ref >60)
Glucose, Bld: 199 mg/dL — ABNORMAL HIGH (ref 70–99)
Potassium: 5.6 mmol/L — ABNORMAL HIGH (ref 3.5–5.1)
Sodium: 133 mmol/L — ABNORMAL LOW (ref 135–145)

## 2021-12-15 MED ORDER — SENNA 8.6 MG PO TABS
2.0000 | ORAL_TABLET | Freq: Every day | ORAL | Status: DC
  Administered 2021-12-15 – 2021-12-20 (×6): 17.2 mg via ORAL
  Filled 2021-12-15 (×6): qty 2

## 2021-12-15 MED ORDER — ALUM & MAG HYDROXIDE-SIMETH 200-200-20 MG/5ML PO SUSP
30.0000 mL | ORAL | Status: DC | PRN
  Administered 2021-12-15: 30 mL via ORAL
  Filled 2021-12-15: qty 30

## 2021-12-15 MED ORDER — APIXABAN 5 MG PO TABS
10.0000 mg | ORAL_TABLET | Freq: Once | ORAL | Status: AC
  Administered 2021-12-15: 10 mg via ORAL
  Filled 2021-12-15: qty 2

## 2021-12-15 MED ORDER — POLYETHYLENE GLYCOL 3350 17 G PO PACK
17.0000 g | PACK | Freq: Every day | ORAL | Status: DC
  Administered 2021-12-15 – 2021-12-20 (×6): 17 g via ORAL
  Filled 2021-12-15 (×6): qty 1

## 2021-12-15 MED ORDER — REVEFENACIN 175 MCG/3ML IN SOLN
175.0000 ug | Freq: Every day | RESPIRATORY_TRACT | Status: DC
  Administered 2021-12-15 – 2021-12-20 (×6): 175 ug via RESPIRATORY_TRACT
  Filled 2021-12-15 (×6): qty 3

## 2021-12-15 NOTE — Progress Notes (Signed)
PROGRESS NOTE  ORAL HALLGREN XLK:[id number] DOB: [birth year] DOA: 12/07/2021 PCP: [name], MD  Brief History:  86 y.o. female with medical history significant of hypertension, T2DM, COPD, dysphagia requiring dilation who presents to the emergency department due to shortness of breath. She was recently admitted from 10/4 to 10/10 due to acute on chronic hypoxemic respiratory failure secondary to multifocal pneumonia which was treated with IV Rocephin and azithromycin.  She completed a course of antibiotics while still in the hospital and O2 sat was weaned down to 4 to 5 L via [place].  It appeared that patient was on supplemental oxygen at 2 LPM on returning home after discharge.  Home oxygen was being monitored at home and when the home health nurse went to see patient yesterday, she was slightly hypoxic, home oxygen was monitored within the past 24 hours and was noted to have dropped down into the low 80s and the oxygen was increased to 5 L/min via [place], despite this, patient was still hypoxic with an O2 sat of 82% on arrival to the ED Patient was also recently admitted to [facility] from 9/2 to 9/12 due to multifocal pneumonia and acute respiratory failure with hypoxia due to COVID-19 during which she was transferred to ICU and she required BiPAP, she was treated with Paxlovid for 5 days and she continued Decadron and empiric antibiotic.  She was discharged with supplemental oxygen via [place] at 2 LPM   At bedside, patient was requiring supplemental oxygen via HFNC at 15 L, she was not in any acute distress.  There was concern regarding food getting stuck in her throat/chest.   ED Course:  In the emergency department, she was intermittently tachypneic, BP was 131/57, temperature 97.28F, pulse 100 bpm, O2 sat 95% on NRB.  Work-up in the ED showed WBC 15.3, hemoglobin 8.8, hematocrit 32.8, MCV 82.2, platelets 208.  BMP showed sodium 139, potassium 3.7, chloride 107, bicarb 24, glucose 88, BUN  11, creatinine 0.77.  Lactic acid 2.8 > 2.3, BNP 116 (this was 329 about 9 days ago).  Influenza A, B, SARS coronavirus 2 was negative.  Blood culture was pending.  Chest x-ray showed Interval partial clearing of bibasilar airspace opacities, most consistent with improving pneumonia. Solu-Medrol 25 mg x 1 was given, she was started on IV ceftriaxone and azithromycin, IV hydration was provided.  Hospitalist was asked to admit patient for further evaluation and management. She finished 5 days of ceftriaxone and azithromycin.  She continued to have high oxygenation requirements up to 30L HFNC at 50%.  PCCM was consulted to assist.  The patient was continued on IV solumedrol. There was a component of fluid overload.  The patient was started on IV lasix.  She began to slowly improve with decreasing oxygenation demand.     Assessment/Plan: Acute on chronic respiratory failure with hypoxia O2 sat dropped to 80s on supplemental oxygen via [place] at 5 LPM and required up to [facility] at 30 L/min at 100% to maintain sats Goal pulse ox is >87% no need for pulse ox to be 100%  On 2L at home Weaning to 20L at 50% FiO2>>8L Lasix 40 mg IV x 1 on 10/18, 10/19, and 10/20 10/20 discussed with Dr. Elsworth Soho   Acute exacerbation of COPD 10/14 CTA chest--negative for PE but reports concern for atypical/noninfectious pneumonia--interstitial & alveolar opacities Appreciate pulmonology recommendations.  Continue duo nebs, Mucinex, Solu-Medrol, Singulair  Finished 5 days ceftriaxone/azithromycin. Continue  Protonix to prevent steroid-induced ulcer Continue incentive spirometry and flutter valve 12/08/21 TTE: LVEF 60-65% , no WMA, G1 DD, normal RVF 10/19--weaning steroids Add yupelri   Lobar pneumonia  Chest CTA--no PE; concern for noninfectious/atypical pneumonia  Consulted pulmonology on 10/16.  Procalcitonin <0.10 Patient was started on ceftriaxone and azithromycin Continue Tylenol as needed Continue Mucinex, incentive  spirometry, flutter valve    Bilateral Calf DVT Confirmed by venous US 10/14 TED  [facility] Apixaban ordered dosed per pharm D  PE was ruled out by CTA  -may need to hold apixaban if hemoptysis worsens -Hgb stable   Lactic acidosis  -from hypoxia Lactic acid 2.8 > 2.3 trended down   Dysphagia Last EGD on 04/2016 by Dr. Oneida Alar  showed no endoscopic esophageal abnormality to explain patient's dysphagia. Esophagus dilated per medical record Seen by speech--dys 3 with thin   T2DM with steroid induced hyperglycemia  12/08/21 A1C--6.3 Continue ISS and hypoglycemia protocol Increase Semglee 60 units plus novolog 18 units TID with meals, plus supplemental coverage and frequent CBG monitoring   Iron deficiency anemia Continue ferrous sulfate   Essential hypertension Continue losartan, Lopressor, add hydralazine    Mixed hyperlipidemia Continue Lipitor   GERD Continue Protonix   Peripheral neuropathy Continue Neurontin    Family Communication:  son at bedside 10/21   Consultants:  pulm   Code Status:  DNR   DVT Prophylaxis:  apixaban     Procedures: As Listed in Progress Note Above   Antibiotics: Ceftriaxone 10/13>>10/17 Azithro 10/13>>10/17           Subjective: Patient denies fevers, chills, headache, chest pain, dyspnea, nausea, vomiting, diarrhea, abdominal pain, dysuria, hematuria, hematochezia, and melena.   Objective: Vitals:   12/15/21 0958 12/15/21 1000 12/15/21 1100 12/15/21 1127  BP:   (!) 118/35   Pulse:  87 69 71  Resp:  (!) '23 15 19  '$ Temp:    98.1 F (36.7 C)  TempSrc:    Oral  SpO2: 93% 95% (!) 89% 91%  Weight:      Height:        Intake/Output Summary (Last 24 hours) at 12/15/2021 1248 Last data filed at 12/15/2021 0959 Gross per 24 hour  Intake 357 ml  Output 1350 ml  Net -993 ml   Weight change:  Exam:  General:  Pt is alert, follows commands appropriately, not in acute distress HEENT: No icterus, No thrush, No neck mass,  [place]/AT Cardiovascular: RRR, S1/S2, no rubs, no gallops Respiratory: bilateral scattered rhonchi Abdomen: Soft/+BS, non tender, non distended, no guarding Extremities: No edema, No lymphangitis, No petechiae, No rashes, no synovitis   Data Reviewed: I have personally reviewed following labs and imaging studies Basic Metabolic Panel: Recent Labs  Lab 12/09/21 0343 12/10/21 0418 12/11/21 0308 12/13/21 0308 12/14/21 0318  NA 138 137 137 136 138  K 4.9 4.8 4.9 4.3 4.4  CL 104 104 104 103 102  CO2 '28 27 25 27 29  '$ GLUCOSE 297* 196* 245* 210* 106*  BUN '15 16 19 '$ 29* 39*  CREATININE 0.70 0.60 0.55 0.58 0.70  CALCIUM 10.1 10.1 10.3 10.4* 10.8*  MG 1.8 2.0 1.7 1.7 2.0  PHOS  --   --  2.8  --   --    Liver Function Tests: No results for input(s): "AST", "ALT", "ALKPHOS", "BILITOT", "PROT", "ALBUMIN" in the last 168 hours. No results for input(s): "LIPASE", "AMYLASE" in the last 168 hours. No results for input(s): "AMMONIA" in the last 168 hours. Coagulation Profile:  No results for input(s): "INR", "PROTIME" in the last 168 hours. CBC: Recent Labs  Lab 12/09/21 0343 12/10/21 0418 12/11/21 0308 12/12/21 0324 12/14/21 0318  WBC 12.1* 11.4* 11.9* 14.1* 14.1*  NEUTROABS  --   --  10.1*  --   --   HGB 8.5* 8.7* 9.3* 9.4* 10.1*  HCT 28.8* 29.3* 31.3* 31.5* 33.3*  MCV 82.1 81.4 83.2 82.9 82.6  PLT 170 166 168 177 185   Cardiac Enzymes: No results for input(s): "CKTOTAL", "CKMB", "CKMBINDEX", "TROPONINI" in the last 168 hours. BNP: Invalid input(s): "POCBNP" CBG: Recent Labs  Lab 12/14/21 1149 12/14/21 1644 12/14/21 2138 12/15/21 0751 12/15/21 1126  GLUCAP 190* 130* 235* 242* 209*   HbA1C: No results for input(s): "HGBA1C" in the last 72 hours. Urine analysis: No results found for: "COLORURINE", "APPEARANCEUR", "LABSPEC", "PHURINE", "GLUCOSEU", "HGBUR", "BILIRUBINUR", "KETONESUR", "PROTEINUR", "UROBILINOGEN", "NITRITE", "LEUKOCYTESUR" Sepsis  Labs: '@LABRCNTIP'$ (procalcitonin:4,lacticidven:4) ) Recent Results (from the past 240 hour(s))  Resp Panel by RT-PCR (Flu A&B, Covid) Anterior Nasal Swab     Status: None   Collection Time: 12/07/21  2:58 PM   Specimen: Anterior Nasal Swab  Result Value Ref Range Status   SARS Coronavirus 2 by RT PCR NEGATIVE NEGATIVE Final    Comment: (NOTE) SARS-CoV-2 target nucleic acids are NOT DETECTED.  The SARS-CoV-2 RNA is generally detectable in upper respiratory specimens during the acute phase of infection. The lowest concentration of SARS-CoV-2 viral copies this assay can detect is 138 copies/mL. A negative result does not preclude SARS-Cov-2 infection and should not be used as the sole basis for treatment or other patient management decisions. A negative result may occur with  improper specimen collection/handling, submission of specimen other than nasopharyngeal swab, presence of viral mutation(s) within the areas targeted by this assay, and inadequate number of viral copies(<138 copies/mL). A negative result must be combined with clinical observations, patient history, and epidemiological information. The expected result is Negative.  Fact Sheet for Patients:  EntrepreneurPulse.com.au  Fact Sheet for Healthcare Providers:  IncredibleEmployment.be  This test is no t yet approved or cleared by the [place] FDA and  has been authorized for detection and/or diagnosis of SARS-CoV-2 by FDA under an Emergency Use Authorization (EUA). This EUA will remain  in effect (meaning this test can be used) for the duration of the COVID-19 declaration under Section 564(b)(1) of the Act, 21 U.S.C.section 360bbb-3(b)(1), unless the authorization is terminated  or revoked sooner.       Influenza A by PCR NEGATIVE NEGATIVE Final   Influenza B by PCR NEGATIVE NEGATIVE Final    Comment: (NOTE) The Xpert Xpress SARS-CoV-2/FLU/RSV plus assay is intended as an  aid in the diagnosis of influenza from Nasopharyngeal swab specimens and should not be used as a sole basis for treatment. Nasal washings and aspirates are unacceptable for Xpert Xpress SARS-CoV-2/FLU/RSV testing.  Fact Sheet for Patients: EntrepreneurPulse.com.au  Fact Sheet for Healthcare Providers: IncredibleEmployment.be  This test is not yet approved or cleared by the [place] FDA and has been authorized for detection and/or diagnosis of SARS-CoV-2 by FDA under an Emergency Use Authorization (EUA). This EUA will remain in effect (meaning this test can be used) for the duration of the COVID-19 declaration under Section 564(b)(1) of the Act, 21 U.S.C. section 360bbb-3(b)(1), unless the authorization is terminated or revoked.  Performed at [facility], [address], [place], [place] [address]   Blood Culture (routine x 2)     Status: None   Collection Time: 12/07/21  2:58 PM   Specimen: BLOOD RIGHT FOREARM  Result Value Ref Range Status   Specimen Description BLOOD RIGHT FOREARM  Final   Special Requests   Final    BOTTLES DRAWN AEROBIC AND ANAEROBIC Blood Culture adequate volume   Culture   Final    NO GROWTH 5 DAYS Performed at [facility], [address] [place], [place] [address]    Report Status 12/12/2021 FINAL  Final  Blood Culture (routine x 2)     Status: None   Collection Time: 12/07/21  3:03 PM   Specimen: Right Antecubital; Blood  Result Value Ref Range Status   Specimen Description RIGHT ANTECUBITAL  Final   Special Requests   Final    BOTTLES DRAWN AEROBIC AND ANAEROBIC Blood Culture results may not be optimal due to an inadequate volume of blood received in culture bottles   Culture   Final    NO GROWTH 5 DAYS Performed at [facility], [address] [place], [place] [address]    Report Status 12/12/2021 FINAL  Final  MRSA Next Gen by PCR, Nasal     Status: None   Collection Time: 12/10/21  5:50 PM    Specimen: Nasal Mucosa; Nasal Swab  Result Value Ref Range Status   MRSA by PCR Next Gen NOT DETECTED NOT DETECTED Final    Comment: (NOTE) The GeneXpert MRSA Assay (FDA approved for NASAL specimens only), is one component of a comprehensive MRSA colonization surveillance program. It is not intended to diagnose MRSA infection nor to guide or monitor treatment for MRSA infections. Test performance is not FDA approved in patients less than 25 years old. Performed at [facility], [address] [place], [place] [address]      Scheduled Meds:  apixaban  5 mg Oral BID   aspirin  81 mg Oral Daily   atorvastatin  80 mg Oral Daily   Chlorhexidine Gluconate Cloth  6 each Topical Q0600   docusate sodium  100 mg Oral Daily   feeding supplement (GLUCERNA SHAKE)  237 mL Oral TID BM   ferrous sulfate  325 mg Oral Daily   gabapentin  100 mg Oral TID   hydrALAZINE  50 mg Oral Q8H   insulin aspart  0-15 Units Subcutaneous TID WC   insulin aspart  18 Units Subcutaneous TID WC   insulin glargine-yfgn  60 Units Subcutaneous Daily   ipratropium-albuterol  3 mL Nebulization Q6H   losartan  100 mg Oral Daily   methylPREDNISolone (SOLU-MEDROL) injection  80 mg Intravenous Q12H   metoprolol tartrate  50 mg Oral BID   mometasone-formoterol  2 puff Inhalation BID   montelukast  10 mg Oral QHS   pantoprazole  40 mg Oral Daily   polyvinyl alcohol  1 drop Both Eyes QID   revefenacin  175 mcg Nebulization Daily   Continuous Infusions:  Procedures/Studies: DG Chest 1 View  Result Date: 12/13/2021 CLINICAL DATA:  Shortness of breath EXAM: CHEST  1 VIEW COMPARISON:  12/11/2021 FINDINGS: Mild cardiomegaly and vascular congestion. Mild interstitial prominence and lower lobe airspace opacities. Findings similar to prior study. No visible effusions. No acute bony abnormality. Chronic healed proximal left humeral fracture. IMPRESSION: Cardiomegaly with vascular congestion and probable mild interstitial edema.  Lower lobe airspace opacities are unchanged. Electronically Signed   By: Rolm Baptise M.D.   On: 12/13/2021 01:04   DG Chest Port 1 View  Result Date: 12/11/2021 CLINICAL DATA:  5626 with acute respiratory failure. EXAM: PORTABLE CHEST 1  VIEW COMPARISON:  12/08/2021 portable chest and chest CT. FINDINGS: 4:49 a.m. There is cardiomegaly again noted and mild central vascular prominence, with aortic atherosclerosis. Stable mediastinum. Interstitial and patchy alveolar opacities in the mid to lower lung fields continue to be seen, with a basal gradient. There are minimal pleural effusions. There is improvement in opacities in the base of both lungs but significant opacity remains and is otherwise unaltered. The upper 1/3 of the lungs remain generally clear. There is osteopenia and degenerative change thoracic spine, chronic healed fracture deformity proximal left humerus. IMPRESSION: Improved aeration at the base of both lungs. No other noteworthy change in the bilateral lung opacities and underlying interstitial prominence. Cardiomegaly. Electronically Signed   By: Telford Nab M.D.   On: 12/11/2021 06:29   DG CHEST PORT 1 VIEW  Result Date: 12/08/2021 CLINICAL DATA:  Respiratory failure EXAM: PORTABLE CHEST 1 VIEW COMPARISON:  12/07/2021 FINDINGS: No significant change in AP portable chest radiograph. Cardiomegaly with bilateral interstitial and heterogeneous airspace opacity of the lung bases, and probable small layering pleural effusions. Partially imaged chronic fracture deformity of the proximal left humerus. IMPRESSION: No significant change in AP portable chest radiograph. Cardiomegaly with bilateral interstitial and heterogeneous airspace opacity of the lung bases, and probable small layering pleural effusions. Electronically Signed   By: Delanna Ahmadi M.D.   On: 12/08/2021 16:16   ECHOCARDIOGRAM COMPLETE  Result Date: 12/08/2021    ECHOCARDIOGRAM REPORT   Patient Name:   Michaela Moon Date  of Exam: 12/08/2021 Medical Rec #:  [id number]       Height:       65.0 in Accession #:    [id number]      Weight:       178.8 lb Date of Birth:  [birth year]        BSA:          1.886 m Patient Age:    61 years        BP:           160/42 mmHg Patient Gender: F               HR:           68 bpm. Exam Location:  [facility] Procedure: 2D Echo, Cardiac Doppler and Color Doppler Indications:    CHF  History:        Patient has no prior history of Echocardiogram examinations.                 COPD; Risk Factors:Hypertension, Diabetes and Dyslipidemia.  Sonographer:    Wenda Low Referring Phys: [id number] OLADAPO ADEFESO IMPRESSIONS  1. Left ventricular ejection fraction, by estimation, is 60 to 65%. The left ventricle has normal function. The left ventricle has no regional wall motion abnormalities. The left ventricular internal cavity size was mildly dilated. There is mild left ventricular hypertrophy of the septal segment. Left ventricular diastolic parameters are consistent with Grade I diastolic dysfunction (impaired relaxation).  2. Right ventricular systolic function is normal. The right ventricular size is normal. There is normal pulmonary artery systolic pressure.  3. The mitral valve is grossly normal. No evidence of mitral valve regurgitation.  4. The aortic valve is tricuspid. Aortic valve regurgitation is not visualized. No aortic stenosis is present.  5. The inferior vena cava is normal in size with greater than 50% respiratory variability, suggesting right atrial pressure of 3 mmHg. Comparison(s): No prior Echocardiogram. FINDINGS  Left Ventricle: Left ventricular ejection fraction, by  estimation, is 60 to 65%. The left ventricle has normal function. The left ventricle has no regional wall motion abnormalities. The left ventricular internal cavity size was mildly dilated. There is  mild left ventricular hypertrophy of the septal segment. Left ventricular diastolic parameters are consistent with Grade I  diastolic dysfunction (impaired relaxation). Right Ventricle: The right ventricular size is normal. No increase in right ventricular wall thickness. Right ventricular systolic function is normal. There is normal pulmonary artery systolic pressure. The tricuspid regurgitant velocity is 2.06 m/s, and  with an assumed right atrial pressure of 3 mmHg, the estimated right ventricular systolic pressure is 02.4 mmHg. Left Atrium: Left atrial size was normal in size. Right Atrium: Right atrial size was normal in size. Pericardium: There is no evidence of pericardial effusion. Mitral Valve: The mitral valve is grossly normal. Mild mitral annular calcification. No evidence of mitral valve regurgitation. MV peak gradient, 4.9 mmHg. The mean mitral valve gradient is 2.0 mmHg. Tricuspid Valve: The tricuspid valve is normal in structure. Tricuspid valve regurgitation is not demonstrated. No evidence of tricuspid stenosis. Aortic Valve: The aortic valve is tricuspid. There is mild aortic valve annular calcification. Aortic valve regurgitation is not visualized. No aortic stenosis is present. Aortic valve mean gradient measures 6.0 mmHg. Aortic valve peak gradient measures 12.1 mmHg. Aortic valve area, by VTI measures 2.03 cm. Pulmonic Valve: The pulmonic valve was normal in structure. Pulmonic valve regurgitation is not visualized. No evidence of pulmonic stenosis. Aorta: The aortic root is normal in size and structure. Venous: The inferior vena cava is normal in size with greater than 50% respiratory variability, suggesting right atrial pressure of 3 mmHg. IAS/Shunts: No atrial level shunt detected by color flow Doppler.  LEFT VENTRICLE PLAX 2D LVIDd:         5.30 cm   Diastology LVIDs:         3.20 cm   LV e' medial:    10.00 cm/s LV PW:         1.20 cm   LV E/e' medial:  7.4 LV IVS:        1.00 cm   LV e' lateral:   8.05 cm/s LVOT diam:     1.90 cm   LV E/e' lateral: 9.2 LV SV:         77 LV SV Index:   41 LVOT Area:     2.84  cm  RIGHT VENTRICLE RV Basal diam:  3.30 cm RV Mid diam:    2.40 cm RV S prime:     15.70 cm/s TAPSE (M-mode): 3.0 cm LEFT ATRIUM             Index        RIGHT ATRIUM           Index LA diam:        4.00 cm 2.12 cm/m   RA Area:     14.80 cm LA Vol (A2C):   52.9 ml 28.05 ml/m  RA Volume:   38.50 ml  20.41 ml/m LA Vol (A4C):   54.0 ml 28.63 ml/m LA Biplane Vol: 55.5 ml 29.43 ml/m  AORTIC VALVE                     PULMONIC VALVE AV Area (Vmax):    1.66 cm      PV Vmax:       1.05 m/s AV Area (Vmean):   1.69 cm      PV Peak grad:  4.4 mmHg AV Area (VTI):     2.03 cm AV Vmax:           174.00 cm/s AV Vmean:          118.000 cm/s AV VTI:            0.380 m AV Peak Grad:      12.1 mmHg AV Mean Grad:      6.0 mmHg LVOT Vmax:         102.00 cm/s LVOT Vmean:        70.400 cm/s LVOT VTI:          0.272 m LVOT/AV VTI ratio: 0.72  AORTA Ao Root diam: 3.20 cm MITRAL VALVE                TRICUSPID VALVE MV Area (PHT): 2.92 cm     TR Peak grad:   17.0 mmHg MV Area VTI:   2.38 cm     TR Vmax:        206.00 cm/s MV Peak grad:  4.9 mmHg MV Mean grad:  2.0 mmHg     SHUNTS MV Vmax:       1.11 m/s     Systemic VTI:  0.27 m MV Vmean:      63.0 cm/s    Systemic Diam: 1.90 cm MV Decel Time: 260 msec MV E velocity: 73.70 cm/s MV A velocity: 104.00 cm/s MV E/A ratio:  0.71 Rudean Haskell MD Electronically signed by Rudean Haskell MD Signature Date/Time: 12/08/2021/2:14:12 PM    Final    CT Angio Chest Pulmonary Embolism (PE) W or WO Contrast  Result Date: 12/08/2021 CLINICAL DATA:  Positive D-dimer.  DVT EXAM: CT ANGIOGRAPHY CHEST WITH CONTRAST TECHNIQUE: Multidetector CT imaging of the chest was performed using the standard protocol during bolus administration of intravenous contrast. Multiplanar CT image reconstructions and MIPs were obtained to evaluate the vascular anatomy. RADIATION DOSE REDUCTION: This exam was performed according to the departmental dose-optimization program which includes automated  exposure control, adjustment of the mA and/or kV according to patient size and/or use of iterative reconstruction technique. CONTRAST:  14m OMNIPAQUE IOHEXOL 350 MG/ML SOLN COMPARISON:  11/28/2021 FINDINGS: Cardiovascular: Satisfactory opacification of the pulmonary arteries to the segmental level. No evidence of pulmonary embolism. Normal heart size. No pericardial effusion. Extensive atheromatous calcification of the aorta and coronaries Mediastinum/Nodes: Negative for adenopathy. Lungs/Pleura: Unchanged pattern of multifocal consolidation affecting all lobes with ground-glass, nodular, and consolidative opacities. Lower lobe airspace opacity is new/progressed from July 2023 but remaining opacity is very similar. Central airways are clear. Mild emphysematous change. In an otherwise clear area of lung there is an 8 mm irregular pulmonary nodule which is known from prior CTs. Upper Abdomen: Atheromatous plaque. Dystrophic type calcification in the right posterior liver. Granulomatous calcifications in the spleen. Musculoskeletal: No acute finding. Generalized thoracic spine degeneration. Review of the MIP images confirms the above findings. IMPRESSION: 1. Negative for pulmonary embolism. 2. Multi lobar airspace disease much of which is chronic when compared to a July 2023 CT. Lingular opacity is definitely chronic when compared to a January 2023 CT, and progressive. Consider atypical and non infectious pneumonias. Consider alveolar tumor. 3. Known spiculated nodule in the right upper lobe. Electronically Signed   By: JJorje GuildM.D.   On: 12/08/2021 12:02   [place] Img Lower Bilateral (DVT)  Result Date: 12/08/2021 CLINICAL DATA:  Bilateral lower extremity edema. EXAM: BILATERAL LOWER EXTREMITY VENOUS DOPPLER ULTRASOUND TECHNIQUE: Gray-scale sonography with  graded compression, as well as color Doppler and duplex ultrasound were performed to evaluate the lower extremity deep venous systems from the level  of the common femoral vein and including the common femoral, femoral, profunda femoral, popliteal and calf veins including the posterior tibial, peroneal and gastrocnemius veins when visible. The superficial great saphenous vein was also interrogated. Spectral Doppler was utilized to evaluate flow at rest and with distal augmentation maneuvers in the common femoral, femoral and popliteal veins. COMPARISON:  None Available. FINDINGS: RIGHT LOWER EXTREMITY Common Femoral Vein: No evidence of thrombus. Normal compressibility, respiratory phasicity and response to augmentation. Saphenofemoral Junction: No evidence of thrombus. Normal compressibility and flow on color Doppler imaging. Profunda Femoral Vein: No evidence of thrombus. Normal compressibility and flow on color Doppler imaging. Femoral Vein: No evidence of thrombus. Normal compressibility, respiratory phasicity and response to augmentation. Popliteal Vein: No evidence of thrombus. Normal compressibility, respiratory phasicity and response to augmentation. Calf Veins: Normal patency of posterior tibial vein. There is some visualized thrombus in the right peroneal vein which appears occlusive in the proximal calf. Gastrocnemius thrombus identified in the right calf. Superficial Great Saphenous Vein: No evidence of thrombus. Normal compressibility. Venous Reflux:  None. Other Findings: No evidence of superficial thrombophlebitis or abnormal fluid collection. LEFT LOWER EXTREMITY Common Femoral Vein: No evidence of thrombus. Normal compressibility, respiratory phasicity and response to augmentation. Saphenofemoral Junction: No evidence of thrombus. Normal compressibility and flow on color Doppler imaging. Profunda Femoral Vein: No evidence of thrombus. Normal compressibility and flow on color Doppler imaging. Femoral Vein: No evidence of thrombus. Normal compressibility, respiratory phasicity and response to augmentation. Popliteal Vein: No evidence of thrombus.  Normal compressibility, respiratory phasicity and response to augmentation. Calf Veins: Posterior tibial vein is normally patent. Left peroneal vein DVT visualized. Superficial Great Saphenous Vein: No evidence of thrombus. Normal compressibility. Venous Reflux:  None. Other Findings: No evidence of superficial thrombophlebitis or abnormal fluid collection. IMPRESSION: Positive for bilateral calf DVT in the peroneal veins and right gastrocnemius vein. Electronically Signed   By: Aletta Edouard M.D.   On: 12/08/2021 11:50   DG Chest Port 1 View  Result Date: 12/07/2021 CLINICAL DATA:  Questionable sepsis. Evaluate for abnormality. Hypoxemia. History of asthma/COPD. EXAM: PORTABLE CHEST 1 VIEW COMPARISON:  Radiographs 11/29/2021 and 11/28/2021.  CT 11/28/2021. FINDINGS: 1509 hours. The heart size and mediastinal contours appear stable. Compared with the most recent studies, there is partial clearing of the bibasilar airspace opacities, most consistent with improving pneumonia. No pneumothorax or significant pleural effusion identified. No acute osseous findings are seen. Telemetry leads overlie the chest. There is multilevel thoracic spondylosis. IMPRESSION: Interval partial clearing of bibasilar airspace opacities, most consistent with improving pneumonia. Continued follow-up recommended to document complete clearing. No new findings identified. Electronically Signed   By: Richardean Sale M.D.   On: 12/07/2021 15:27   DG Chest 1 View  Result Date: 11/29/2021 CLINICAL DATA:  Shortness of breath EXAM: CHEST  1 VIEW COMPARISON:  Chest x-ray 11/28/2021. FINDINGS: Cardiomediastinal silhouette is within normal limits. There is increasing patchy airspace disease in the bilateral mid and lower lungs. There is no pleural effusion or pneumothorax. No acute fractures are seen. There is a healed left humeral neck fracture. IMPRESSION: Increasing patchy airspace disease in the bilateral mid and lower lungs, concerning  for multifocal pneumonia. Electronically Signed   By: Ronney Asters M.D.   On: 11/29/2021 20:36   CT Angio Chest PE W and/or Wo Contrast  Result Date: 11/28/2021  CLINICAL DATA:  PE suspected.  Low O2 sats. EXAM: CT ANGIOGRAPHY CHEST WITH CONTRAST TECHNIQUE: Multidetector CT imaging of the chest was performed using the standard protocol during bolus administration of intravenous contrast. Multiplanar CT image reconstructions and MIPs were obtained to evaluate the vascular anatomy. RADIATION DOSE REDUCTION: This exam was performed according to the departmental dose-optimization program which includes automated exposure control, adjustment of the mA and/or kV according to patient size and/or use of iterative reconstruction technique. CONTRAST:  55m OMNIPAQUE IOHEXOL 350 MG/ML SOLN COMPARISON:  CT chest 09/20/2021 FINDINGS: Cardiovascular: Satisfactory opacification of the pulmonary arteries to the segmental level. No evidence of pulmonary embolism. Normal heart size. No pericardial effusion. Calcific aortic atherosclerosis. Multivessel coronary artery calcification. Mediastinum/Nodes: No enlarged mediastinal, hilar, or axillary lymph nodes. Thyroid gland and trachea demonstrate no significant findings. Patulous esophagus. Lungs/Pleura: A 1.0 x 0.8 cm irregular nodule in the anterior right upper lobe appears subsolid on today's exam (series 6, image 57). Irregular consolidation with air bronchograms and surrounding ground-glass opacities dependently located in the left upper lobe is not significantly changed compared to 09/20/2021. Airspace consolidation with air bronchograms dependently located in the right upper and right middle lobes with adjacent ground-glass opacities are increased compared to 09/20/2021. Similarly, patchy consolidations with surrounding ground-glass opacities in the lower lungs are also increased compared to 09/16/2021, particularly in the right lower lobe. No pleural effusion or  pneumothorax. Upper Abdomen: No acute abnormality. Benign left hepatic cyst again noted. No follow-up imaging indicated. Musculoskeletal: No chest wall abnormality. No acute or suspicious osseous findings. Multilevel degenerative changes in the thoracic and visualized upper lumbar spine. Review of the MIP images confirms the above findings. IMPRESSION: 1. No pulmonary embolism. 2. Multifocal, dependently located, consolidations bilaterally, favored to represent multifocal pneumonia, possibly aspiration related. Recommend follow-up CT imaging in 3 months after treatment to ensure resolution. 3. The 1 cm nodule in the anterior right upper lobe appears subsolid on today's exam. As previously noted, bronchogenic carcinoma cannot be excluded. Recommend attention on follow-up imaging. 4. Multivessel coronary artery disease. Aortic Atherosclerosis (ICD10-I70.0). Electronically Signed   By: LIleana RoupM.D.   On: 11/28/2021 16:15   DG Chest Port 1 View  Result Date: 11/28/2021 CLINICAL DATA:  Dyspnea, shaking, hypoxia, history COPD, asthma, diabetes mellitus, hypertension EXAM: PORTABLE CHEST 1 VIEW COMPARISON:  Portable exam 1304 hours compared to 10/31/2021 FINDINGS: Upper normal size of cardiac silhouette. Mediastinal contours and pulmonary vascularity normal. Atherosclerotic calcification aorta. Bibasilar infiltrates question multifocal pneumonia. Minimal pleural effusions. No pneumothorax. Bones demineralized with posttraumatic deformity proximal LEFT humerus and multilevel degenerative disc disease changes/scoliosis thoracic spine. IMPRESSION: Bibasilar pulmonary infiltrates question multifocal pneumonia. Minimal pleural effusions. Aortic Atherosclerosis (ICD10-I70.0). Electronically Signed   By: MLavonia DanaM.D.   On: 11/28/2021 13:15    DOrson Eva DO  [facility]  If 7PM-7AM, please contact night-coverage [email or web address] Password [email or web address] 12/15/2021, 12:48 PM   LOS: 8 days

## 2021-12-15 NOTE — Plan of Care (Signed)

## 2021-12-16 DIAGNOSIS — I10 Essential (primary) hypertension: Secondary | ICD-10-CM | POA: Diagnosis not present

## 2021-12-16 DIAGNOSIS — I5031 Acute diastolic (congestive) heart failure: Secondary | ICD-10-CM | POA: Diagnosis not present

## 2021-12-16 DIAGNOSIS — J441 Chronic obstructive pulmonary disease with (acute) exacerbation: Secondary | ICD-10-CM | POA: Diagnosis not present

## 2021-12-16 DIAGNOSIS — J9621 Acute and chronic respiratory failure with hypoxia: Secondary | ICD-10-CM | POA: Diagnosis not present

## 2021-12-16 LAB — COMPREHENSIVE METABOLIC PANEL
ALT: 78 U/L — ABNORMAL HIGH (ref 0–44)
AST: 41 U/L (ref 15–41)
Albumin: 2.8 g/dL — ABNORMAL LOW (ref 3.5–5.0)
Alkaline Phosphatase: 37 U/L — ABNORMAL LOW (ref 38–126)
Anion gap: 7 (ref 5–15)
BUN: 45 mg/dL — ABNORMAL HIGH (ref 8–23)
CO2: 26 mmol/L (ref 22–32)
Calcium: 10.3 mg/dL (ref 8.9–10.3)
Chloride: 103 mmol/L (ref 98–111)
Creatinine, Ser: 0.68 mg/dL (ref 0.44–1.00)
GFR, Estimated: >60 mL/min (ref >60)
Glucose, Bld: 190 mg/dL — ABNORMAL HIGH (ref 70–99)
Potassium: 5 mmol/L (ref 3.5–5.1)
Sodium: 136 mmol/L (ref 135–145)
Total Bilirubin: 0.4 mg/dL (ref 0.3–1.2)
Total Protein: 5.7 g/dL — ABNORMAL LOW (ref 6.5–8.1)

## 2021-12-16 LAB — CBC
HCT: 30.8 % — ABNORMAL LOW (ref 36.0–46.0)
Hemoglobin: 9.3 g/dL — ABNORMAL LOW (ref 12.0–15.0)
MCH: 25.4 pg — ABNORMAL LOW (ref 26.0–34.0)
MCHC: 30.2 g/dL (ref 30.0–36.0)
MCV: 84.2 fL (ref 80.0–100.0)
Platelets: 156 10*3/uL (ref 150–400)
RBC: 3.66 MIL/uL — ABNORMAL LOW (ref 3.87–5.11)
RDW: 27.7 % — ABNORMAL HIGH (ref 11.5–15.5)
WBC: 16.8 10*3/uL — ABNORMAL HIGH (ref 4.0–10.5)
nRBC: 0.1 % (ref 0.0–0.2)

## 2021-12-16 LAB — GLUCOSE, CAPILLARY
Glucose-Capillary: 232 mg/dL — ABNORMAL HIGH (ref 70–99)
Glucose-Capillary: 190 mg/dL — ABNORMAL HIGH (ref 70–99)
Glucose-Capillary: 117 mg/dL — ABNORMAL HIGH (ref 70–99)
Glucose-Capillary: 266 mg/dL — ABNORMAL HIGH (ref 70–99)

## 2021-12-16 MED ORDER — FUROSEMIDE 10 MG/ML IJ SOLN
40.0000 mg | Freq: Every day | INTRAMUSCULAR | Status: DC
  Administered 2021-12-16 – 2021-12-20 (×5): 40 mg via INTRAVENOUS
  Filled 2021-12-16 (×5): qty 4

## 2021-12-16 MED ORDER — CLOTRIMAZOLE 1 % EX CREA
TOPICAL_CREAM | Freq: Two times a day (BID) | CUTANEOUS | Status: DC
  Filled 2021-12-16: qty 15

## 2021-12-16 NOTE — Progress Notes (Addendum)
PROGRESS NOTE  Michaela Moon:[id number] DOB: 1936/02/25 DOA: 12/07/2021 PCP: [name], MD  Brief History:  86 y.o. female with medical history significant of hypertension, T2DM, COPD, dysphagia requiring dilation who presents to the emergency department due to shortness of breath. She was recently admitted from 10/4 to 10/10 due to acute on chronic hypoxemic respiratory failure secondary to multifocal pneumonia which was treated with IV Rocephin and azithromycin.  She completed a course of antibiotics while still in the hospital and O2 sat was weaned down to 4 to 5 L via [place].  It appeared that patient was on supplemental oxygen at 2 LPM on returning home after discharge.  Home oxygen was being monitored at home and when the home health nurse went to see patient yesterday, she was slightly hypoxic, home oxygen was monitored within the past 24 hours and was noted to have dropped down into the low 80s and the oxygen was increased to 5 L/min via [place], despite this, patient was still hypoxic with an O2 sat of 82% on arrival to the ED Patient was also recently admitted to [facility] from 9/2 to 9/12 due to multifocal pneumonia and acute respiratory failure with hypoxia due to COVID-19 during which she was transferred to ICU and she required BiPAP, she was treated with Paxlovid for 5 days and she continued Decadron and empiric antibiotic.  She was discharged with supplemental oxygen via [place] at 2 LPM   At bedside, patient was requiring supplemental oxygen via HFNC at 15 L, she was not in any acute distress.  There was concern regarding food getting stuck in her throat/chest.   ED Course:  In the emergency department, she was intermittently tachypneic, BP was 131/57, temperature 97.13F, pulse 100 bpm, O2 sat 95% on NRB.  Work-up in the ED showed WBC 15.3, hemoglobin 8.8, hematocrit 32.8, MCV 82.2, platelets 208.  BMP showed sodium 139, potassium 3.7, chloride 107, bicarb 24, glucose 88, BUN  11, creatinine 0.77.  Lactic acid 2.8 > 2.3, BNP 116 (this was 329 about 9 days ago).  Influenza A, B, SARS coronavirus 2 was negative.  Blood culture was pending.  Chest x-ray showed Interval partial clearing of bibasilar airspace opacities, most consistent with improving pneumonia. Solu-Medrol 25 mg x 1 was given, she was started on IV ceftriaxone and azithromycin, IV hydration was provided.  Hospitalist was asked to admit patient for further evaluation and management. She finished 5 days of ceftriaxone and azithromycin.  She continued to have high oxygenation requirements up to 30L HFNC at 50%.  PCCM was consulted to assist.  The patient was continued on IV solumedrol. There was a component of fluid overload.  The patient was started on IV lasix.  She began to slowly improve with decreasing oxygenation demand.     Assessment/Plan: Acute on chronic respiratory failure with hypoxia O2 sat dropped to 80s on supplemental oxygen via [place] at 5 LPM and required up to [facility] at 30 L/min at 100% to maintain sats Goal pulse ox is >87% no need for pulse ox to be 100%  On 2L at home Weaning to 20L at 50% FiO2>>8L Lasix 40 mg IV x 1 on 10/18, 10/19, and 10/20 10/20 discussed with Dr. Elsworth Soho   Acute exacerbation of COPD 10/14 CTA chest--negative for PE but reports concern for atypical/noninfectious pneumonia--interstitial & alveolar opacities Appreciate pulmonology recommendations.  Continue duo nebs, Mucinex, Solu-Medrol, Singulair  Finished 5 days ceftriaxone/azithromycin. Continue  Protonix to prevent steroid-induced ulcer Continue incentive spirometry and flutter valve 12/08/21 TTE: LVEF 60-65% , no WMA, G1 DD, normal RVF 10/19--weaning steroids Add yupelri  Acute diastolic CHF -restart IV lasix -personally reviewed CXR--increased interstitial markings -accurate I/O -daily weights   Lobar pneumonia  Chest CTA--no PE; concern for noninfectious/atypical pneumonia  Consulted pulmonology on 10/16.   Procalcitonin <0.10 Patient was started on ceftriaxone and azithromycin Continue Tylenol as needed Continue Mucinex, incentive spirometry, flutter valve    Bilateral Calf DVT Confirmed by venous US 10/14 TED  [facility] Apixaban ordered dosed per pharm D  PE was ruled out by CTA  -may need to hold apixaban if hemoptysis worsens -Hgb stable   Lactic acidosis  -from hypoxia Lactic acid 2.8 > 2.3 trended down   Dysphagia Last EGD on 04/2016 by Dr. Oneida Alar  showed no endoscopic esophageal abnormality to explain patient's dysphagia. Esophagus dilated per medical record Seen by speech--dys 3 with thin   T2DM with steroid induced hyperglycemia  12/08/21 A1C--6.3 Continue ISS and hypoglycemia protocol Increase Semglee 60 units plus novolog 18 units TID with meals, plus supplemental coverage and frequent CBG monitoring   Iron deficiency anemia Continue ferrous sulfate   Essential hypertension Continue losartan, Lopressor, add hydralazine    Mixed hyperlipidemia Continue Lipitor   GERD Continue Protonix   Peripheral neuropathy Continue Neurontin     Family Communication:  son at bedside 10/21   Consultants:  pulm   Code Status:  DNR   DVT Prophylaxis:  apixaban     Procedures: As Listed in Progress Note Above   Antibiotics: Ceftriaxone 10/13>>10/17 Azithro 10/13>>10/17          Subjective: Patient denies fevers, chills, headache, chest pain, dyspnea, nausea, vomiting, diarrhea, abdominal pain, dysuria, hematuria, hematochezia, and melena.   Objective: Vitals:   12/16/21 0900 12/16/21 1000 12/16/21 1003 12/16/21 1100  BP: (!) 126/30 (!) 117/22 (!) 117/22 (!) 117/29  Pulse: 90 (!) 102 98 86  Resp: 18 20  (!) 21  Temp:      TempSrc:      SpO2: 94% 90%  (!) 87%  Weight:      Height:        Intake/Output Summary (Last 24 hours) at 12/16/2021 1111 Last data filed at 12/16/2021 0931 Gross per 24 hour  Intake 597 ml  Output 1151 ml  Net -554 ml    Weight change:  Exam:  General:  Pt is alert, follows commands appropriately, not in acute distress HEENT: No icterus, No thrush, No neck mass, [place]/AT Cardiovascular: RRR, S1/S2, no rubs, no gallops Respiratory: bilateral scattered rhonchi Abdomen: Soft/+BS, non tender, non distended, no guarding Extremities: Non pitting edema, No lymphangitis, No petechiae, No rashes, no synovitis   Data Reviewed: I have personally reviewed following labs and imaging studies Basic Metabolic Panel: Recent Labs  Lab 12/10/21 0418 12/11/21 0308 12/13/21 0308 12/14/21 0318 12/15/21 1252 12/16/21 0340  NA 137 137 136 138 133* 136  K 4.8 4.9 4.3 4.4 5.6* 5.0  CL 104 104 103 102 100 103  CO2 '27 25 27 29 27 26  '$ GLUCOSE 196* 245* 210* 106* 199* 190*  BUN 16 19 29* 39* 49* 45*  CREATININE 0.60 0.55 0.58 0.70 0.89 0.68  CALCIUM 10.1 10.3 10.4* 10.8* 10.2 10.3  MG 2.0 1.7 1.7 2.0  --   --   PHOS  --  2.8  --   --   --   --    Liver Function Tests: Recent Labs  Lab 12/16/21 0340  AST 41  ALT 78*  ALKPHOS 37*  BILITOT 0.4  PROT 5.7*  ALBUMIN 2.8*   No results for input(s): "LIPASE", "AMYLASE" in the last 168 hours. No results for input(s): "AMMONIA" in the last 168 hours. Coagulation Profile: No results for input(s): "INR", "PROTIME" in the last 168 hours. CBC: Recent Labs  Lab 12/10/21 0418 12/11/21 0308 12/12/21 0324 12/14/21 0318 12/16/21 0340  WBC 11.4* 11.9* 14.1* 14.1* 16.8*  NEUTROABS  --  10.1*  --   --   --   HGB 8.7* 9.3* 9.4* 10.1* 9.3*  HCT 29.3* 31.3* 31.5* 33.3* 30.8*  MCV 81.4 83.2 82.9 82.6 84.2  PLT 166 168 177 185 156   Cardiac Enzymes: No results for input(s): "CKTOTAL", "CKMB", "CKMBINDEX", "TROPONINI" in the last 168 hours. BNP: Invalid input(s): "POCBNP" CBG: Recent Labs  Lab 12/15/21 0751 12/15/21 1126 12/15/21 1626 12/15/21 2112 12/16/21 0730  GLUCAP 242* 209* 144* 178* 232*   HbA1C: No results for input(s): "HGBA1C" in the last 72  hours. Urine analysis: No results found for: "COLORURINE", "APPEARANCEUR", "LABSPEC", "PHURINE", "GLUCOSEU", "HGBUR", "BILIRUBINUR", "KETONESUR", "PROTEINUR", "UROBILINOGEN", "NITRITE", "LEUKOCYTESUR" Sepsis Labs: '@LABRCNTIP'$ (procalcitonin:4,lacticidven:4) ) Recent Results (from the past 240 hour(s))  Resp Panel by RT-PCR (Flu A&B, Covid) Anterior Nasal Swab     Status: None   Collection Time: 12/07/21  2:58 PM   Specimen: Anterior Nasal Swab  Result Value Ref Range Status   SARS Coronavirus 2 by RT PCR NEGATIVE NEGATIVE Final    Comment: (NOTE) SARS-CoV-2 target nucleic acids are NOT DETECTED.  The SARS-CoV-2 RNA is generally detectable in upper respiratory specimens during the acute phase of infection. The lowest concentration of SARS-CoV-2 viral copies this assay can detect is 138 copies/mL. A negative result does not preclude SARS-Cov-2 infection and should not be used as the sole basis for treatment or other patient management decisions. A negative result may occur with  improper specimen collection/handling, submission of specimen other than nasopharyngeal swab, presence of viral mutation(s) within the areas targeted by this assay, and inadequate number of viral copies(<138 copies/mL). A negative result must be combined with clinical observations, patient history, and epidemiological information. The expected result is Negative.  Fact Sheet for Patients:  EntrepreneurPulse.com.au  Fact Sheet for Healthcare Providers:  IncredibleEmployment.be  This test is no t yet approved or cleared by the [place] FDA and  has been authorized for detection and/or diagnosis of SARS-CoV-2 by FDA under an Emergency Use Authorization (EUA). This EUA will remain  in effect (meaning this test can be used) for the duration of the COVID-19 declaration under Section 564(b)(1) of the Act, 21 U.S.C.section 360bbb-3(b)(1), unless the authorization is  terminated  or revoked sooner.       Influenza A by PCR NEGATIVE NEGATIVE Final   Influenza B by PCR NEGATIVE NEGATIVE Final    Comment: (NOTE) The Xpert Xpress SARS-CoV-2/FLU/RSV plus assay is intended as an aid in the diagnosis of influenza from Nasopharyngeal swab specimens and should not be used as a sole basis for treatment. Nasal washings and aspirates are unacceptable for Xpert Xpress SARS-CoV-2/FLU/RSV testing.  Fact Sheet for Patients: EntrepreneurPulse.com.au  Fact Sheet for Healthcare Providers: IncredibleEmployment.be  This test is not yet approved or cleared by the [place] FDA and has been authorized for detection and/or diagnosis of SARS-CoV-2 by FDA under an Emergency Use Authorization (EUA). This EUA will remain in effect (meaning this test can be used) for the duration of the COVID-19 declaration under Section  564(b)(1) of the Act, 21 U.S.C. section 360bbb-3(b)(1), unless the authorization is terminated or revoked.  Performed at [facility], [address] [place], [place] [address]   Blood Culture (routine x 2)     Status: None   Collection Time: 12/07/21  2:58 PM   Specimen: BLOOD RIGHT FOREARM  Result Value Ref Range Status   Specimen Description BLOOD RIGHT FOREARM  Final   Special Requests   Final    BOTTLES DRAWN AEROBIC AND ANAEROBIC Blood Culture adequate volume   Culture   Final    NO GROWTH 5 DAYS Performed at [facility], [address] [place], [place] [address]    Report Status 12/12/2021 FINAL  Final  Blood Culture (routine x 2)     Status: None   Collection Time: 12/07/21  3:03 PM   Specimen: Right Antecubital; Blood  Result Value Ref Range Status   Specimen Description RIGHT ANTECUBITAL  Final   Special Requests   Final    BOTTLES DRAWN AEROBIC AND ANAEROBIC Blood Culture results may not be optimal due to an inadequate volume of blood received in culture bottles   Culture   Final    NO GROWTH  5 DAYS Performed at [facility], [address] [place], [place] [address]    Report Status 12/12/2021 FINAL  Final  MRSA Next Gen by PCR, Nasal     Status: None   Collection Time: 12/10/21  5:50 PM   Specimen: Nasal Mucosa; Nasal Swab  Result Value Ref Range Status   MRSA by PCR Next Gen NOT DETECTED NOT DETECTED Final    Comment: (NOTE) The GeneXpert MRSA Assay (FDA approved for NASAL specimens only), is one component of a comprehensive MRSA colonization surveillance program. It is not intended to diagnose MRSA infection nor to guide or monitor treatment for MRSA infections. Test performance is not FDA approved in patients less than 52 years old. Performed at [facility], [address] [place], [place] [address]      Scheduled Meds:  apixaban  5 mg Oral BID   aspirin  81 mg Oral Daily   atorvastatin  80 mg Oral Daily   Chlorhexidine Gluconate Cloth  6 each Topical Q0600   docusate sodium  100 mg Oral Daily   feeding supplement (GLUCERNA SHAKE)  237 mL Oral TID BM   ferrous sulfate  325 mg Oral Daily   furosemide  40 mg Intravenous Daily   gabapentin  100 mg Oral TID   hydrALAZINE  50 mg Oral Q8H   insulin aspart  0-15 Units Subcutaneous TID WC   insulin aspart  18 Units Subcutaneous TID WC   insulin glargine-yfgn  60 Units Subcutaneous Daily   ipratropium-albuterol  3 mL Nebulization Q6H   losartan  100 mg Oral Daily   methylPREDNISolone (SOLU-MEDROL) injection  80 mg Intravenous Q12H   metoprolol tartrate  50 mg Oral BID   mometasone-formoterol  2 puff Inhalation BID   montelukast  10 mg Oral QHS   pantoprazole  40 mg Oral Daily   polyethylene glycol  17 g Oral Daily   polyvinyl alcohol  1 drop Both Eyes QID   revefenacin  175 mcg Nebulization Daily   senna  2 tablet Oral Daily   Continuous Infusions:  Procedures/Studies: DG Chest 1 View  Result Date: 12/13/2021 CLINICAL DATA:  Shortness of breath EXAM: CHEST  1 VIEW COMPARISON:  12/11/2021 FINDINGS: Mild  cardiomegaly and vascular congestion. Mild interstitial prominence and lower lobe airspace opacities.  Findings similar to prior study. No visible effusions. No acute bony abnormality. Chronic healed proximal left humeral fracture. IMPRESSION: Cardiomegaly with vascular congestion and probable mild interstitial edema. Lower lobe airspace opacities are unchanged. Electronically Signed   By: Rolm Baptise M.D.   On: 12/13/2021 01:04   DG Chest Port 1 View  Result Date: 12/11/2021 CLINICAL DATA:  5626 with acute respiratory failure. EXAM: PORTABLE CHEST 1 VIEW COMPARISON:  12/08/2021 portable chest and chest CT. FINDINGS: 4:49 a.m. There is cardiomegaly again noted and mild central vascular prominence, with aortic atherosclerosis. Stable mediastinum. Interstitial and patchy alveolar opacities in the mid to lower lung fields continue to be seen, with a basal gradient. There are minimal pleural effusions. There is improvement in opacities in the base of both lungs but significant opacity remains and is otherwise unaltered. The upper 1/3 of the lungs remain generally clear. There is osteopenia and degenerative change thoracic spine, chronic healed fracture deformity proximal left humerus. IMPRESSION: Improved aeration at the base of both lungs. No other noteworthy change in the bilateral lung opacities and underlying interstitial prominence. Cardiomegaly. Electronically Signed   By: Telford Nab M.D.   On: 12/11/2021 06:29   DG CHEST PORT 1 VIEW  Result Date: 12/08/2021 CLINICAL DATA:  Respiratory failure EXAM: PORTABLE CHEST 1 VIEW COMPARISON:  12/07/2021 FINDINGS: No significant change in AP portable chest radiograph. Cardiomegaly with bilateral interstitial and heterogeneous airspace opacity of the lung bases, and probable small layering pleural effusions. Partially imaged chronic fracture deformity of the proximal left humerus. IMPRESSION: No significant change in AP portable chest radiograph. Cardiomegaly  with bilateral interstitial and heterogeneous airspace opacity of the lung bases, and probable small layering pleural effusions. Electronically Signed   By: Delanna Ahmadi M.D.   On: 12/08/2021 16:16   ECHOCARDIOGRAM COMPLETE  Result Date: 12/08/2021    ECHOCARDIOGRAM REPORT   Patient Name:   JUDYE LORINO Date of Exam: 12/08/2021 Medical Rec #:  [id number]       Height:       65.0 in Accession #:    [id number]      Weight:       178.8 lb Date of Birth:  1935/11/04        BSA:          1.886 m Patient Age:    72 years        BP:           160/42 mmHg Patient Gender: F               HR:           68 bpm. Exam Location:  [facility] Procedure: 2D Echo, Cardiac Doppler and Color Doppler Indications:    CHF  History:        Patient has no prior history of Echocardiogram examinations.                 COPD; Risk Factors:Hypertension, Diabetes and Dyslipidemia.  Sonographer:    Wenda Low Referring Phys: [id number] OLADAPO ADEFESO IMPRESSIONS  1. Left ventricular ejection fraction, by estimation, is 60 to 65%. The left ventricle has normal function. The left ventricle has no regional wall motion abnormalities. The left ventricular internal cavity size was mildly dilated. There is mild left ventricular hypertrophy of the septal segment. Left ventricular diastolic parameters are consistent with Grade I diastolic dysfunction (impaired relaxation).  2. Right ventricular systolic function is normal. The right ventricular size is normal. There is normal pulmonary  artery systolic pressure.  3. The mitral valve is grossly normal. No evidence of mitral valve regurgitation.  4. The aortic valve is tricuspid. Aortic valve regurgitation is not visualized. No aortic stenosis is present.  5. The inferior vena cava is normal in size with greater than 50% respiratory variability, suggesting right atrial pressure of 3 mmHg. Comparison(s): No prior Echocardiogram. FINDINGS  Left Ventricle: Left ventricular ejection fraction, by  estimation, is 60 to 65%. The left ventricle has normal function. The left ventricle has no regional wall motion abnormalities. The left ventricular internal cavity size was mildly dilated. There is  mild left ventricular hypertrophy of the septal segment. Left ventricular diastolic parameters are consistent with Grade I diastolic dysfunction (impaired relaxation). Right Ventricle: The right ventricular size is normal. No increase in right ventricular wall thickness. Right ventricular systolic function is normal. There is normal pulmonary artery systolic pressure. The tricuspid regurgitant velocity is 2.06 m/s, and  with an assumed right atrial pressure of 3 mmHg, the estimated right ventricular systolic pressure is 60.1 mmHg. Left Atrium: Left atrial size was normal in size. Right Atrium: Right atrial size was normal in size. Pericardium: There is no evidence of pericardial effusion. Mitral Valve: The mitral valve is grossly normal. Mild mitral annular calcification. No evidence of mitral valve regurgitation. MV peak gradient, 4.9 mmHg. The mean mitral valve gradient is 2.0 mmHg. Tricuspid Valve: The tricuspid valve is normal in structure. Tricuspid valve regurgitation is not demonstrated. No evidence of tricuspid stenosis. Aortic Valve: The aortic valve is tricuspid. There is mild aortic valve annular calcification. Aortic valve regurgitation is not visualized. No aortic stenosis is present. Aortic valve mean gradient measures 6.0 mmHg. Aortic valve peak gradient measures 12.1 mmHg. Aortic valve area, by VTI measures 2.03 cm. Pulmonic Valve: The pulmonic valve was normal in structure. Pulmonic valve regurgitation is not visualized. No evidence of pulmonic stenosis. Aorta: The aortic root is normal in size and structure. Venous: The inferior vena cava is normal in size with greater than 50% respiratory variability, suggesting right atrial pressure of 3 mmHg. IAS/Shunts: No atrial level shunt detected by color flow  Doppler.  LEFT VENTRICLE PLAX 2D LVIDd:         5.30 cm   Diastology LVIDs:         3.20 cm   LV e' medial:    10.00 cm/s LV PW:         1.20 cm   LV E/e' medial:  7.4 LV IVS:        1.00 cm   LV e' lateral:   8.05 cm/s LVOT diam:     1.90 cm   LV E/e' lateral: 9.2 LV SV:         77 LV SV Index:   41 LVOT Area:     2.84 cm  RIGHT VENTRICLE RV Basal diam:  3.30 cm RV Mid diam:    2.40 cm RV S prime:     15.70 cm/s TAPSE (M-mode): 3.0 cm LEFT ATRIUM             Index        RIGHT ATRIUM           Index LA diam:        4.00 cm 2.12 cm/m   RA Area:     14.80 cm LA Vol (A2C):   52.9 ml 28.05 ml/m  RA Volume:   38.50 ml  20.41 ml/m LA Vol (A4C):   54.0 ml 28.63  ml/m LA Biplane Vol: 55.5 ml 29.43 ml/m  AORTIC VALVE                     PULMONIC VALVE AV Area (Vmax):    1.66 cm      PV Vmax:       1.05 m/s AV Area (Vmean):   1.69 cm      PV Peak grad:  4.4 mmHg AV Area (VTI):     2.03 cm AV Vmax:           174.00 cm/s AV Vmean:          118.000 cm/s AV VTI:            0.380 m AV Peak Grad:      12.1 mmHg AV Mean Grad:      6.0 mmHg LVOT Vmax:         102.00 cm/s LVOT Vmean:        70.400 cm/s LVOT VTI:          0.272 m LVOT/AV VTI ratio: 0.72  AORTA Ao Root diam: 3.20 cm MITRAL VALVE                TRICUSPID VALVE MV Area (PHT): 2.92 cm     TR Peak grad:   17.0 mmHg MV Area VTI:   2.38 cm     TR Vmax:        206.00 cm/s MV Peak grad:  4.9 mmHg MV Mean grad:  2.0 mmHg     SHUNTS MV Vmax:       1.11 m/s     Systemic VTI:  0.27 m MV Vmean:      63.0 cm/s    Systemic Diam: 1.90 cm MV Decel Time: 260 msec MV E velocity: 73.70 cm/s MV A velocity: 104.00 cm/s MV E/A ratio:  0.71 Rudean Haskell MD Electronically signed by Rudean Haskell MD Signature Date/Time: 12/08/2021/2:14:12 PM    Final    CT Angio Chest Pulmonary Embolism (PE) W or WO Contrast  Result Date: 12/08/2021 CLINICAL DATA:  Positive D-dimer.  DVT EXAM: CT ANGIOGRAPHY CHEST WITH CONTRAST TECHNIQUE: Multidetector CT imaging of the chest was  performed using the standard protocol during bolus administration of intravenous contrast. Multiplanar CT image reconstructions and MIPs were obtained to evaluate the vascular anatomy. RADIATION DOSE REDUCTION: This exam was performed according to the departmental dose-optimization program which includes automated exposure control, adjustment of the mA and/or kV according to patient size and/or use of iterative reconstruction technique. CONTRAST:  43m OMNIPAQUE IOHEXOL 350 MG/ML SOLN COMPARISON:  11/28/2021 FINDINGS: Cardiovascular: Satisfactory opacification of the pulmonary arteries to the segmental level. No evidence of pulmonary embolism. Normal heart size. No pericardial effusion. Extensive atheromatous calcification of the aorta and coronaries Mediastinum/Nodes: Negative for adenopathy. Lungs/Pleura: Unchanged pattern of multifocal consolidation affecting all lobes with ground-glass, nodular, and consolidative opacities. Lower lobe airspace opacity is new/progressed from July 2023 but remaining opacity is very similar. Central airways are clear. Mild emphysematous change. In an otherwise clear area of lung there is an 8 mm irregular pulmonary nodule which is known from prior CTs. Upper Abdomen: Atheromatous plaque. Dystrophic type calcification in the right posterior liver. Granulomatous calcifications in the spleen. Musculoskeletal: No acute finding. Generalized thoracic spine degeneration. Review of the MIP images confirms the above findings. IMPRESSION: 1. Negative for pulmonary embolism. 2. Multi lobar airspace disease much of which is chronic when compared to a July 2023 CT. Lingular opacity is definitely  chronic when compared to a January 2023 CT, and progressive. Consider atypical and non infectious pneumonias. Consider alveolar tumor. 3. Known spiculated nodule in the right upper lobe. Electronically Signed   By: Jorje Guild M.D.   On: 12/08/2021 12:02   US Venous Img Lower Bilateral  (DVT)  Result Date: 12/08/2021 CLINICAL DATA:  Bilateral lower extremity edema. EXAM: BILATERAL LOWER EXTREMITY VENOUS DOPPLER ULTRASOUND TECHNIQUE: Gray-scale sonography with graded compression, as well as color Doppler and duplex ultrasound were performed to evaluate the lower extremity deep venous systems from the level of the common femoral vein and including the common femoral, femoral, profunda femoral, popliteal and calf veins including the posterior tibial, peroneal and gastrocnemius veins when visible. The superficial great saphenous vein was also interrogated. Spectral Doppler was utilized to evaluate flow at rest and with distal augmentation maneuvers in the common femoral, femoral and popliteal veins. COMPARISON:  None Available. FINDINGS: RIGHT LOWER EXTREMITY Common Femoral Vein: No evidence of thrombus. Normal compressibility, respiratory phasicity and response to augmentation. Saphenofemoral Junction: No evidence of thrombus. Normal compressibility and flow on color Doppler imaging. Profunda Femoral Vein: No evidence of thrombus. Normal compressibility and flow on color Doppler imaging. Femoral Vein: No evidence of thrombus. Normal compressibility, respiratory phasicity and response to augmentation. Popliteal Vein: No evidence of thrombus. Normal compressibility, respiratory phasicity and response to augmentation. Calf Veins: Normal patency of posterior tibial vein. There is some visualized thrombus in the right peroneal vein which appears occlusive in the proximal calf. Gastrocnemius thrombus identified in the right calf. Superficial Great Saphenous Vein: No evidence of thrombus. Normal compressibility. Venous Reflux:  None. Other Findings: No evidence of superficial thrombophlebitis or abnormal fluid collection. LEFT LOWER EXTREMITY Common Femoral Vein: No evidence of thrombus. Normal compressibility, respiratory phasicity and response to augmentation. Saphenofemoral Junction: No evidence of  thrombus. Normal compressibility and flow on color Doppler imaging. Profunda Femoral Vein: No evidence of thrombus. Normal compressibility and flow on color Doppler imaging. Femoral Vein: No evidence of thrombus. Normal compressibility, respiratory phasicity and response to augmentation. Popliteal Vein: No evidence of thrombus. Normal compressibility, respiratory phasicity and response to augmentation. Calf Veins: Posterior tibial vein is normally patent. Left peroneal vein DVT visualized. Superficial Great Saphenous Vein: No evidence of thrombus. Normal compressibility. Venous Reflux:  None. Other Findings: No evidence of superficial thrombophlebitis or abnormal fluid collection. IMPRESSION: Positive for bilateral calf DVT in the peroneal veins and right gastrocnemius vein. Electronically Signed   By: Aletta Edouard M.D.   On: 12/08/2021 11:50   DG Chest Port 1 View  Result Date: 12/07/2021 CLINICAL DATA:  Questionable sepsis. Evaluate for abnormality. Hypoxemia. History of asthma/COPD. EXAM: PORTABLE CHEST 1 VIEW COMPARISON:  Radiographs 11/29/2021 and 11/28/2021.  CT 11/28/2021. FINDINGS: 1509 hours. The heart size and mediastinal contours appear stable. Compared with the most recent studies, there is partial clearing of the bibasilar airspace opacities, most consistent with improving pneumonia. No pneumothorax or significant pleural effusion identified. No acute osseous findings are seen. Telemetry leads overlie the chest. There is multilevel thoracic spondylosis. IMPRESSION: Interval partial clearing of bibasilar airspace opacities, most consistent with improving pneumonia. Continued follow-up recommended to document complete clearing. No new findings identified. Electronically Signed   By: Richardean Sale M.D.   On: 12/07/2021 15:27   DG Chest 1 View  Result Date: 11/29/2021 CLINICAL DATA:  Shortness of breath EXAM: CHEST  1 VIEW COMPARISON:  Chest x-ray 11/28/2021. FINDINGS: Cardiomediastinal  silhouette is within normal limits. There is increasing patchy airspace  disease in the bilateral mid and lower lungs. There is no pleural effusion or pneumothorax. No acute fractures are seen. There is a healed left humeral neck fracture. IMPRESSION: Increasing patchy airspace disease in the bilateral mid and lower lungs, concerning for multifocal pneumonia. Electronically Signed   By: Ronney Asters M.D.   On: 11/29/2021 20:36   CT Angio Chest PE W and/or Wo Contrast  Result Date: 11/28/2021 CLINICAL DATA:  PE suspected.  Low O2 sats. EXAM: CT ANGIOGRAPHY CHEST WITH CONTRAST TECHNIQUE: Multidetector CT imaging of the chest was performed using the standard protocol during bolus administration of intravenous contrast. Multiplanar CT image reconstructions and MIPs were obtained to evaluate the vascular anatomy. RADIATION DOSE REDUCTION: This exam was performed according to the departmental dose-optimization program which includes automated exposure control, adjustment of the mA and/or kV according to patient size and/or use of iterative reconstruction technique. CONTRAST:  36m OMNIPAQUE IOHEXOL 350 MG/ML SOLN COMPARISON:  CT chest 09/20/2021 FINDINGS: Cardiovascular: Satisfactory opacification of the pulmonary arteries to the segmental level. No evidence of pulmonary embolism. Normal heart size. No pericardial effusion. Calcific aortic atherosclerosis. Multivessel coronary artery calcification. Mediastinum/Nodes: No enlarged mediastinal, hilar, or axillary lymph nodes. Thyroid gland and trachea demonstrate no significant findings. Patulous esophagus. Lungs/Pleura: A 1.0 x 0.8 cm irregular nodule in the anterior right upper lobe appears subsolid on today's exam (series 6, image 57). Irregular consolidation with air bronchograms and surrounding ground-glass opacities dependently located in the left upper lobe is not significantly changed compared to 09/20/2021. Airspace consolidation with air bronchograms  dependently located in the right upper and right middle lobes with adjacent ground-glass opacities are increased compared to 09/20/2021. Similarly, patchy consolidations with surrounding ground-glass opacities in the lower lungs are also increased compared to 09/16/2021, particularly in the right lower lobe. No pleural effusion or pneumothorax. Upper Abdomen: No acute abnormality. Benign left hepatic cyst again noted. No follow-up imaging indicated. Musculoskeletal: No chest wall abnormality. No acute or suspicious osseous findings. Multilevel degenerative changes in the thoracic and visualized upper lumbar spine. Review of the MIP images confirms the above findings. IMPRESSION: 1. No pulmonary embolism. 2. Multifocal, dependently located, consolidations bilaterally, favored to represent multifocal pneumonia, possibly aspiration related. Recommend follow-up CT imaging in 3 months after treatment to ensure resolution. 3. The 1 cm nodule in the anterior right upper lobe appears subsolid on today's exam. As previously noted, bronchogenic carcinoma cannot be excluded. Recommend attention on follow-up imaging. 4. Multivessel coronary artery disease. Aortic Atherosclerosis (ICD10-I70.0). Electronically Signed   By: LIleana RoupM.D.   On: 11/28/2021 16:15   DG Chest Port 1 View  Result Date: 11/28/2021 CLINICAL DATA:  Dyspnea, shaking, hypoxia, history COPD, asthma, diabetes mellitus, hypertension EXAM: PORTABLE CHEST 1 VIEW COMPARISON:  Portable exam 1304 hours compared to 10/31/2021 FINDINGS: Upper normal size of cardiac silhouette. Mediastinal contours and pulmonary vascularity normal. Atherosclerotic calcification aorta. Bibasilar infiltrates question multifocal pneumonia. Minimal pleural effusions. No pneumothorax. Bones demineralized with posttraumatic deformity proximal LEFT humerus and multilevel degenerative disc disease changes/scoliosis thoracic spine. IMPRESSION: Bibasilar pulmonary infiltrates question  multifocal pneumonia. Minimal pleural effusions. Aortic Atherosclerosis (ICD10-I70.0). Electronically Signed   By: MLavonia DanaM.D.   On: 11/28/2021 13:15    DOrson Eva DO  [facility]  If 7PM-7AM, please contact night-coverage [email or web address] Password [email or web address] 12/16/2021, 11:11 AM   LOS: 9 days

## 2021-12-17 DIAGNOSIS — Z515 Encounter for palliative care: Secondary | ICD-10-CM | POA: Diagnosis not present

## 2021-12-17 DIAGNOSIS — J9621 Acute and chronic respiratory failure with hypoxia: Secondary | ICD-10-CM | POA: Diagnosis not present

## 2021-12-17 DIAGNOSIS — Z7189 Other specified counseling: Secondary | ICD-10-CM | POA: Diagnosis not present

## 2021-12-17 DIAGNOSIS — I5031 Acute diastolic (congestive) heart failure: Secondary | ICD-10-CM | POA: Diagnosis not present

## 2021-12-17 DIAGNOSIS — Z66 Do not resuscitate: Secondary | ICD-10-CM | POA: Diagnosis not present

## 2021-12-17 DIAGNOSIS — I10 Essential (primary) hypertension: Secondary | ICD-10-CM | POA: Diagnosis not present

## 2021-12-17 DIAGNOSIS — J441 Chronic obstructive pulmonary disease with (acute) exacerbation: Secondary | ICD-10-CM | POA: Diagnosis not present

## 2021-12-17 DIAGNOSIS — J9601 Acute respiratory failure with hypoxia: Secondary | ICD-10-CM | POA: Diagnosis not present

## 2021-12-17 LAB — BASIC METABOLIC PANEL
Anion gap: 7 (ref 5–15)
BUN: 38 mg/dL — ABNORMAL HIGH (ref 8–23)
CO2: 26 mmol/L (ref 22–32)
Calcium: 9.9 mg/dL (ref 8.9–10.3)
Chloride: 101 mmol/L (ref 98–111)
Creatinine, Ser: 0.67 mg/dL (ref 0.44–1.00)
GFR, Estimated: >60 mL/min (ref >60)
Glucose, Bld: 289 mg/dL — ABNORMAL HIGH (ref 70–99)
Potassium: 4.8 mmol/L (ref 3.5–5.1)
Sodium: 134 mmol/L — ABNORMAL LOW (ref 135–145)

## 2021-12-17 LAB — GLUCOSE, CAPILLARY
Glucose-Capillary: 281 mg/dL — ABNORMAL HIGH (ref 70–99)
Glucose-Capillary: 272 mg/dL — ABNORMAL HIGH (ref 70–99)
Glucose-Capillary: 263 mg/dL — ABNORMAL HIGH (ref 70–99)
Glucose-Capillary: 126 mg/dL — ABNORMAL HIGH (ref 70–99)
Glucose-Capillary: 312 mg/dL — ABNORMAL HIGH (ref 70–99)
Glucose-Capillary: 170 mg/dL — ABNORMAL HIGH (ref 70–99)

## 2021-12-17 LAB — CBC
HCT: 30.2 % — ABNORMAL LOW (ref 36.0–46.0)
Hemoglobin: 9.3 g/dL — ABNORMAL LOW (ref 12.0–15.0)
MCH: 25.8 pg — ABNORMAL LOW (ref 26.0–34.0)
MCHC: 30.8 g/dL (ref 30.0–36.0)
MCV: 83.9 fL (ref 80.0–100.0)
Platelets: 150 10*3/uL (ref 150–400)
RBC: 3.6 MIL/uL — ABNORMAL LOW (ref 3.87–5.11)
RDW: 27.6 % — ABNORMAL HIGH (ref 11.5–15.5)
WBC: 12 10*3/uL — ABNORMAL HIGH (ref 4.0–10.5)
nRBC: 0 % (ref 0.0–0.2)

## 2021-12-17 LAB — MAGNESIUM: Magnesium: 2.1 mg/dL (ref 1.7–2.4)

## 2021-12-17 LAB — BRAIN NATRIURETIC PEPTIDE: B Natriuretic Peptide: 100 pg/mL (ref 0.0–100.0)

## 2021-12-17 MED ORDER — INSULIN ASPART 100 UNIT/ML IJ SOLN: 0.0000 [IU] | Freq: Every day | INTRAMUSCULAR | Status: DC

## 2021-12-17 MED ORDER — INSULIN GLARGINE-YFGN 100 UNIT/ML ~~LOC~~ SOLN
35.0000 [IU] | Freq: Two times a day (BID) | SUBCUTANEOUS | Status: DC
  Administered 2021-12-18 – 2021-12-19 (×3): 35 [IU] via SUBCUTANEOUS
  Filled 2021-12-17 (×7): qty 0.35

## 2021-12-17 MED ORDER — FUROSEMIDE 10 MG/ML IJ SOLN
40.0000 mg | Freq: Once | INTRAMUSCULAR | Status: AC
  Administered 2021-12-17: 40 mg via INTRAVENOUS
  Filled 2021-12-17: qty 4

## 2021-12-17 MED ORDER — METHYLPREDNISOLONE SODIUM SUCC 40 MG IJ SOLR
40.0000 mg | Freq: Two times a day (BID) | INTRAMUSCULAR | Status: DC
  Administered 2021-12-17 – 2021-12-20 (×6): 40 mg via INTRAVENOUS
  Filled 2021-12-17 (×6): qty 1

## 2021-12-17 MED ORDER — INSULIN ASPART 100 UNIT/ML IJ SOLN
0.0000 [IU] | Freq: Three times a day (TID) | INTRAMUSCULAR | Status: DC
  Administered 2021-12-17: 11 [IU] via SUBCUTANEOUS
  Administered 2021-12-18: 2 [IU] via SUBCUTANEOUS
  Administered 2021-12-18: 3 [IU] via SUBCUTANEOUS
  Administered 2021-12-19: 2 [IU] via SUBCUTANEOUS
  Administered 2021-12-20: 11 [IU] via SUBCUTANEOUS

## 2021-12-17 MED ORDER — IPRATROPIUM-ALBUTEROL 0.5-2.5 (3) MG/3ML IN SOLN
3.0000 mL | Freq: Three times a day (TID) | RESPIRATORY_TRACT | Status: DC
  Administered 2021-12-17 – 2021-12-20 (×10): 3 mL via RESPIRATORY_TRACT
  Filled 2021-12-17 (×9): qty 3

## 2021-12-17 NOTE — Plan of Care (Signed)

## 2021-12-17 NOTE — Progress Notes (Signed)
Physical Therapy Treatment Patient Details Name: Michaela Moon MRN: [id number] DOB: 10-12-1935 Today's Date: 12/17/2021   History of Present Illness Michaela Moon is a 86 y.o. female with medical history significant of hypertension, T2DM, COPD, dysphagia requiring dilation who presents to the emergency department due to shortness of breath.  She was recently admitted from 10/4 to 10/10 due to acute on chronic hypoxemic respiratory failure secondary to multifocal pneumonia which was treated with IV Rocephin and azithromycin.  She completed a course of antibiotics while still in the hospital and O2 sat was weaned down to 4 to 5 L via [place].  It appeared that patient was on supplemental oxygen at 2 LPM on returning home after discharge.  Home oxygen was being monitored at home and when the home health nurse went to see patient yesterday, she was slightly hypoxic, home oxygen was monitored within the past 24 hours and was noted to have dropped down into the low 80s and the oxygen was increased to 5 L/min via [place], despite this, patient was still hypoxic with an O2 sat of 82% on arrival to the ED  Patient was also recently admitted to [facility] from 9/2 to 9/12 due to multifocal pneumonia and acute respiratory failure with hypoxia due to COVID-19 during which she was transferred to ICU and she required BiPAP, she was treated with Paxlovid for 5 days and she continued Decadron and empiric antibiotic.  She was discharged with supplemental oxygen via [place] at 2 LPM     At bedside, patient was requiring supplemental oxygen via [facility] at 15 L, she was not in any acute distress.  There was concern regarding food getting stuck in her throat/chest.    PT Comments    PT sat down after 5 reps of marching due to SOB> After resting therapist asked pt if she would like to do some more standing exercises to improve her activity tolerance but pt refused.    Recommendations for follow up therapy are one component of a  multi-disciplinary discharge planning process, led by the attending physician.  Recommendations may be updated based on patient status, additional functional criteria and insurance authorization.  Follow Up Recommendations  Home health PT     Assistance Recommended at Discharge Set up Supervision/Assistance  Patient can return home with the following A little help with walking and/or transfers;A little help with bathing/dressing/bathroom;Help with stairs or ramp for entrance;Assistance with cooking/housework   Equipment Recommendations  Rolling walker (2 wheels)    Recommendations for Other Services  OT     Precautions / Restrictions Precautions Precautions: Fall Restrictions Weight Bearing Restrictions: No     Mobility  Bed Mobility Overal bed mobility: Modified Independent Bed Mobility: Supine to Sit, Sit to Supine     Supine to sit: Supervision          Transfers Overall transfer level: Modified independent Equipment used: Rolling walker (2 wheels) Transfers: Sit to/from Stand, Bed to chair/wheelchair/BSC Sit to Stand: Supervision   Step pivot transfers: Supervision            Ambulation/Gait Ambulation/Gait assistance: Supervision Gait Distance (Feet): 5 Feet Assistive device: Rolling walker (2 wheels) Gait Pattern/deviations: Decreased step length - right, Decreased step length - left       General Gait Details: PT O2 only dropped to 89 this session   Stairs             Wheelchair Mobility    Modified Rankin (Stroke Patients Only)  Cognition Arousal/Alertness: Awake/alert Behavior During Therapy: WFL for tasks assessed/performed Overall Cognitive Status: Within Functional Limits for tasks assessed                                          Exercises General Exercises - Lower Extremity Ankle Circles/Pumps: Both, 10 reps Quad Sets: Both, 10 reps Heel Slides: Both, 5 reps Hip Flexion/Marching: Both, 5 reps,  Standing        Pertinent Vitals/Pain Pain Assessment Pain Assessment: No/denies pain           PT Goals (current goals can now be found in the care plan section)      Frequency    Min 3X/week      PT Plan  Continue to improve pt activity tolerance. PT is mod I with activities she just tires and becomes SOB easily.          End of Session Equipment Utilized During Treatment: Gait belt;Oxygen Activity Tolerance: Patient tolerated treatment well;Patient limited by fatigue Patient left: in chair;with call bell/phone within reach Nurse Communication: Mobility status PT Visit Diagnosis: Unsteadiness on feet (R26.81);Other abnormalities of gait and mobility (R26.89);Muscle weakness (generalized) (M62.81)     Time: 1610-1640 PT Time Calculation (min) (ACUTE ONLY): 30 min  Charges:  $Therapeutic Exercise: 23-37 mins                      Rayetta Humphrey, PT CLT [phone]  12/17/2021, 4:48 PM

## 2021-12-17 NOTE — Progress Notes (Signed)
PROGRESS NOTE  Michaela Moon ZDG:[id number] DOB: 11/01/1935 DOA: 12/07/2021 PCP: [name], MD  Brief History:  86 y.o. female with medical history significant of hypertension, T2DM, COPD, dysphagia requiring dilation who presents to the emergency department due to shortness of breath. She was recently admitted from 10/4 to 10/10 due to acute on chronic hypoxemic respiratory failure secondary to multifocal pneumonia which was treated with IV Rocephin and azithromycin.  She completed a course of antibiotics while still in the hospital and O2 sat was weaned down to 4 to 5 L via [place].  It appeared that patient was on supplemental oxygen at 2 LPM on returning home after discharge.  Home oxygen was being monitored at home and when the home health nurse went to see patient yesterday, she was slightly hypoxic, home oxygen was monitored within the past 24 hours and was noted to have dropped down into the low 80s and the oxygen was increased to 5 L/min via [place], despite this, patient was still hypoxic with an O2 sat of 82% on arrival to the ED Patient was also recently admitted to [facility] from 9/2 to 9/12 due to multifocal pneumonia and acute respiratory failure with hypoxia due to COVID-19 during which she was transferred to ICU and she required BiPAP, she was treated with Paxlovid for 5 days and she continued Decadron and empiric antibiotic.  She was discharged with supplemental oxygen via [place] at 2 LPM   At bedside, patient was requiring supplemental oxygen via HFNC at 15 L, she was not in any acute distress.  There was concern regarding food getting stuck in her throat/chest.   ED Course:  In the emergency department, she was intermittently tachypneic, BP was 131/57, temperature 97.71F, pulse 100 bpm, O2 sat 95% on NRB.  Work-up in the ED showed WBC 15.3, hemoglobin 8.8, hematocrit 32.8, MCV 82.2, platelets 208.  BMP showed sodium 139, potassium 3.7, chloride 107, bicarb 24, glucose 88, BUN  11, creatinine 0.77.  Lactic acid 2.8 > 2.3, BNP 116 (this was 329 about 9 days ago).  Influenza A, B, SARS coronavirus 2 was negative.  Blood culture was pending.  Chest x-ray showed Interval partial clearing of bibasilar airspace opacities, most consistent with improving pneumonia. Solu-Medrol 25 mg x 1 was given, she was started on IV ceftriaxone and azithromycin, IV hydration was provided.  Hospitalist was asked to admit patient for further evaluation and management. She finished 5 days of ceftriaxone and azithromycin.  She continued to have high oxygenation requirements up to 30L HFNC at 50%.  PCCM was consulted to assist.  The patient was continued on IV solumedrol. There was a component of fluid overload.  The patient was started on IV lasix.  She began to slowly improve with decreasing oxygenation demand.      Assessment and Plan: Acute on chronic respiratory failure with hypoxia O2 sat dropped to 80s on supplemental oxygen via [place] at 5 LPM and required up to [facility] at 30 L/min at 100% to maintain sats Goal pulse ox is >87% no need for pulse ox to be 100%  On 2L at home Weaning to 20L at 50% FiO2>>8L>>6L Started IV lasix daily 10/20 discussed with Dr. Elsworth Soho   Acute exacerbation of COPD 10/14 CTA chest--negative for PE but reports concern for atypical/noninfectious pneumonia--interstitial & alveolar opacities Appreciate pulmonology recommendations.  Continue duo nebs, Mucinex, Solu-Medrol, Singulair  Finished 5 days ceftriaxone/azithromycin. Continue Protonix to prevent steroid-induced  ulcer Continue incentive spirometry and flutter valve 12/08/21 TTE: LVEF 60-65% , no WMA, G1 DD, normal RVF 10/19--weaning steroids Add edyupelri   Acute diastolic CHF -continue IV lasix -personally reviewed CXR--increased interstitial markings -accurate I/O -daily weights   Lobar pneumonia  Chest CTA--no PE; concern for noninfectious/atypical pneumonia  Consulted pulmonology on 10/16.   Procalcitonin <0.10 Patient was started on ceftriaxone and azithromycin Continue Tylenol as needed Continue Mucinex, incentive spirometry, flutter valve    Bilateral Calf DVT Confirmed by venous US 10/14 TED  [facility]oses Apixaban ordered dosed per pharm D  PE was ruled out by CTA  -may need to hold apixaban if hemoptysis worsens -Hgb stable   Lactic acidosis  -from hypoxia Lactic acid 2.8 > 2.3 trended down   Dysphagia Last EGD on 04/2016 by Dr. Oneida Alar  showed no endoscopic esophageal abnormality to explain patient's dysphagia. Esophagus dilated per medical record Seen by speech--dys 3 with thin   T2DM with steroid induced hyperglycemia  12/08/21 A1C--6.3 Continue ISS and hypoglycemia protocol Increase Semglee 35 units bid plus novolog 18 units TID with meals, plus supplemental coverage and frequent CBG monitoring   Iron deficiency anemia Continue ferrous sulfate   Essential hypertension Continue losartan, Lopressor, add hydralazine    Mixed hyperlipidemia Continue Lipitor   GERD Continue Protonix   Peripheral neuropathy Continue Neurontin     Family Communication:  son at bedside 10/23   Consultants:  pulm   Code Status:  DNR   DVT Prophylaxis:  apixaban     Procedures: As Listed in Progress Note Above   Antibiotics: Ceftriaxone 10/13>>10/17 Azithro 10/13>>10/17       Subjective: Pt states she is breathing better.  Denies f/c, cp, sob, n/v/d, abd pain  Objective: Vitals:   12/17/21 1400 12/17/21 1414 12/17/21 1500 12/17/21 1600  BP: 121/71  (!) 115/28 (!) 111/25  Pulse: 73  72 85  Resp: (!) '22  15 17  '$ Temp:      TempSrc:      SpO2: 90% 90% (!) 89% (!) 89%  Weight:      Height:        Intake/Output Summary (Last 24 hours) at 12/17/2021 1708 Last data filed at 12/17/2021 1100 Gross per 24 hour  Intake --  Output 2951 ml  Net -2951 ml   Weight change:  Exam:  General:  Pt is alert, follows commands appropriately, not in acute  distress HEENT: No icterus, No thrush, No neck mass, [place]/AT Cardiovascular: RRR, S1/S2, no rubs, no gallops Respiratory: scattered rhonchi. Abdomen: Soft/+BS, non tender, non distended, no guarding Extremities: Non pitting edema, No lymphangitis, No petechiae, No rashes, no synovitis   Data Reviewed: I have personally reviewed following labs and imaging studies Basic Metabolic Panel: Recent Labs  Lab 12/11/21 0308 12/13/21 0308 12/14/21 0318 12/15/21 1252 12/16/21 0340 12/17/21 0340  NA 137 136 138 133* 136 134*  K 4.9 4.3 4.4 5.6* 5.0 4.8  CL 104 103 102 100 103 101  CO2 '25 27 29 27 26 26  '$ GLUCOSE 245* 210* 106* 199* 190* 289*  BUN 19 29* 39* 49* 45* 38*  CREATININE 0.55 0.58 0.70 0.89 0.68 0.67  CALCIUM 10.3 10.4* 10.8* 10.2 10.3 9.9  MG 1.7 1.7 2.0  --   --  2.1  PHOS 2.8  --   --   --   --   --    Liver Function Tests: Recent Labs  Lab 12/16/21 0340  AST 41  ALT 78*  ALKPHOS 37*  BILITOT 0.4  PROT 5.7*  ALBUMIN 2.8*   No results for input(s): "LIPASE", "AMYLASE" in the last 168 hours. No results for input(s): "AMMONIA" in the last 168 hours. Coagulation Profile: No results for input(s): "INR", "PROTIME" in the last 168 hours. CBC: Recent Labs  Lab 12/11/21 0308 12/12/21 0324 12/14/21 0318 12/16/21 0340 12/17/21 0340  WBC 11.9* 14.1* 14.1* 16.8* 12.0*  NEUTROABS 10.1*  --   --   --   --   HGB 9.3* 9.4* 10.1* 9.3* 9.3*  HCT 31.3* 31.5* 33.3* 30.8* 30.2*  MCV 83.2 82.9 82.6 84.2 83.9  PLT 168 177 185 156 150   Cardiac Enzymes: No results for input(s): "CKTOTAL", "CKMB", "CKMBINDEX", "TROPONINI" in the last 168 hours. BNP: Invalid input(s): "POCBNP" CBG: Recent Labs  Lab 12/17/21 0303 12/17/21 0346 12/17/21 0730 12/17/21 1137 12/17/21 1636  GLUCAP 281* 272* 263* 126* 312*   HbA1C: No results for input(s): "HGBA1C" in the last 72 hours. Urine analysis: No results found for: "COLORURINE", "APPEARANCEUR", "LABSPEC", "PHURINE", "GLUCOSEU",  "HGBUR", "BILIRUBINUR", "KETONESUR", "PROTEINUR", "UROBILINOGEN", "NITRITE", "LEUKOCYTESUR" Sepsis Labs: '@LABRCNTIP'$ (procalcitonin:4,lacticidven:4) ) Recent Results (from the past 240 hour(s))  MRSA Next Gen by PCR, Nasal     Status: None   Collection Time: 12/10/21  5:50 PM   Specimen: Nasal Mucosa; Nasal Swab  Result Value Ref Range Status   MRSA by PCR Next Gen NOT DETECTED NOT DETECTED Final    Comment: (NOTE) The GeneXpert MRSA Assay (FDA approved for NASAL specimens only), is one component of a comprehensive MRSA colonization surveillance program. It is not intended to diagnose MRSA infection nor to guide or monitor treatment for MRSA infections. Test performance is not FDA approved in patients less than 73 years old. Performed at [facility], [address] [place], [place] [address]      Scheduled Meds:  apixaban  5 mg Oral BID   aspirin  81 mg Oral Daily   atorvastatin  80 mg Oral Daily   Chlorhexidine Gluconate Cloth  6 each Topical Q0600   clotrimazole   Topical BID   docusate sodium  100 mg Oral Daily   feeding supplement (GLUCERNA SHAKE)  237 mL Oral TID BM   ferrous sulfate  325 mg Oral Daily   furosemide  40 mg Intravenous Daily   gabapentin  100 mg Oral TID   hydrALAZINE  50 mg Oral Q8H   insulin aspart  0-15 Units Subcutaneous TID WC   insulin aspart  0-5 Units Subcutaneous QHS   insulin aspart  18 Units Subcutaneous TID WC   [START ON 12/18/2021] insulin glargine-yfgn  35 Units Subcutaneous BID   ipratropium-albuterol  3 mL Nebulization TID   losartan  100 mg Oral Daily   methylPREDNISolone (SOLU-MEDROL) injection  80 mg Intravenous Q12H   metoprolol tartrate  50 mg Oral BID   mometasone-formoterol  2 puff Inhalation BID   montelukast  10 mg Oral QHS   pantoprazole  40 mg Oral Daily   polyethylene glycol  17 g Oral Daily   polyvinyl alcohol  1 drop Both Eyes QID   revefenacin  175 mcg Nebulization Daily   senna  2 tablet Oral Daily   Continuous  Infusions:  Procedures/Studies: DG Chest 1 View  Result Date: 12/13/2021 CLINICAL DATA:  Shortness of breath EXAM: CHEST  1 VIEW COMPARISON:  12/11/2021 FINDINGS: Mild cardiomegaly and vascular congestion. Mild interstitial prominence and lower lobe airspace opacities. Findings similar to prior study. No visible effusions. No acute bony abnormality. Chronic healed  proximal left humeral fracture. IMPRESSION: Cardiomegaly with vascular congestion and probable mild interstitial edema. Lower lobe airspace opacities are unchanged. Electronically Signed   By: Rolm Baptise M.D.   On: 12/13/2021 01:04   DG Chest Port 1 View  Result Date: 12/11/2021 CLINICAL DATA:  5626 with acute respiratory failure. EXAM: PORTABLE CHEST 1 VIEW COMPARISON:  12/08/2021 portable chest and chest CT. FINDINGS: 4:49 a.m. There is cardiomegaly again noted and mild central vascular prominence, with aortic atherosclerosis. Stable mediastinum. Interstitial and patchy alveolar opacities in the mid to lower lung fields continue to be seen, with a basal gradient. There are minimal pleural effusions. There is improvement in opacities in the base of both lungs but significant opacity remains and is otherwise unaltered. The upper 1/3 of the lungs remain generally clear. There is osteopenia and degenerative change thoracic spine, chronic healed fracture deformity proximal left humerus. IMPRESSION: Improved aeration at the base of both lungs. No other noteworthy change in the bilateral lung opacities and underlying interstitial prominence. Cardiomegaly. Electronically Signed   By: Telford Nab M.D.   On: 12/11/2021 06:29   DG CHEST PORT 1 VIEW  Result Date: 12/08/2021 CLINICAL DATA:  Respiratory failure EXAM: PORTABLE CHEST 1 VIEW COMPARISON:  12/07/2021 FINDINGS: No significant change in AP portable chest radiograph. Cardiomegaly with bilateral interstitial and heterogeneous airspace opacity of the lung bases, and probable small layering  pleural effusions. Partially imaged chronic fracture deformity of the proximal left humerus. IMPRESSION: No significant change in AP portable chest radiograph. Cardiomegaly with bilateral interstitial and heterogeneous airspace opacity of the lung bases, and probable small layering pleural effusions. Electronically Signed   By: Delanna Ahmadi M.D.   On: 12/08/2021 16:16   ECHOCARDIOGRAM COMPLETE  Result Date: 12/08/2021    ECHOCARDIOGRAM REPORT   Patient Name:   LEXEY FLETES Date of Exam: 12/08/2021 Medical Rec #:  [id number]       Height:       65.0 in Accession #:    [id number]      Weight:       178.8 lb Date of Birth:  1935/12/28        BSA:          1.886 m Patient Age:    21 years        BP:           160/42 mmHg Patient Gender: F               HR:           68 bpm. Exam Location:  [facility] Procedure: 2D Echo, Cardiac Doppler and Color Doppler Indications:    CHF  History:        Patient has no prior history of Echocardiogram examinations.                 COPD; Risk Factors:Hypertension, Diabetes and Dyslipidemia.  Sonographer:    Wenda Low Referring Phys: [id number] OLADAPO ADEFESO IMPRESSIONS  1. Left ventricular ejection fraction, by estimation, is 60 to 65%. The left ventricle has normal function. The left ventricle has no regional wall motion abnormalities. The left ventricular internal cavity size was mildly dilated. There is mild left ventricular hypertrophy of the septal segment. Left ventricular diastolic parameters are consistent with Grade I diastolic dysfunction (impaired relaxation).  2. Right ventricular systolic function is normal. The right ventricular size is normal. There is normal pulmonary artery systolic pressure.  3. The mitral valve is grossly normal. No evidence of  mitral valve regurgitation.  4. The aortic valve is tricuspid. Aortic valve regurgitation is not visualized. No aortic stenosis is present.  5. The inferior vena cava is normal in size with greater than 50%  respiratory variability, suggesting right atrial pressure of 3 mmHg. Comparison(s): No prior Echocardiogram. FINDINGS  Left Ventricle: Left ventricular ejection fraction, by estimation, is 60 to 65%. The left ventricle has normal function. The left ventricle has no regional wall motion abnormalities. The left ventricular internal cavity size was mildly dilated. There is  mild left ventricular hypertrophy of the septal segment. Left ventricular diastolic parameters are consistent with Grade I diastolic dysfunction (impaired relaxation). Right Ventricle: The right ventricular size is normal. No increase in right ventricular wall thickness. Right ventricular systolic function is normal. There is normal pulmonary artery systolic pressure. The tricuspid regurgitant velocity is 2.06 m/s, and  with an assumed right atrial pressure of 3 mmHg, the estimated right ventricular systolic pressure is 82.9 mmHg. Left Atrium: Left atrial size was normal in size. Right Atrium: Right atrial size was normal in size. Pericardium: There is no evidence of pericardial effusion. Mitral Valve: The mitral valve is grossly normal. Mild mitral annular calcification. No evidence of mitral valve regurgitation. MV peak gradient, 4.9 mmHg. The mean mitral valve gradient is 2.0 mmHg. Tricuspid Valve: The tricuspid valve is normal in structure. Tricuspid valve regurgitation is not demonstrated. No evidence of tricuspid stenosis. Aortic Valve: The aortic valve is tricuspid. There is mild aortic valve annular calcification. Aortic valve regurgitation is not visualized. No aortic stenosis is present. Aortic valve mean gradient measures 6.0 mmHg. Aortic valve peak gradient measures 12.1 mmHg. Aortic valve area, by VTI measures 2.03 cm. Pulmonic Valve: The pulmonic valve was normal in structure. Pulmonic valve regurgitation is not visualized. No evidence of pulmonic stenosis. Aorta: The aortic root is normal in size and structure. Venous: The inferior  vena cava is normal in size with greater than 50% respiratory variability, suggesting right atrial pressure of 3 mmHg. IAS/Shunts: No atrial level shunt detected by color flow Doppler.  LEFT VENTRICLE PLAX 2D LVIDd:         5.30 cm   Diastology LVIDs:         3.20 cm   LV e' medial:    10.00 cm/s LV PW:         1.20 cm   LV E/e' medial:  7.4 LV IVS:        1.00 cm   LV e' lateral:   8.05 cm/s LVOT diam:     1.90 cm   LV E/e' lateral: 9.2 LV SV:         77 LV SV Index:   41 LVOT Area:     2.84 cm  RIGHT VENTRICLE RV Basal diam:  3.30 cm RV Mid diam:    2.40 cm RV S prime:     15.70 cm/s TAPSE (M-mode): 3.0 cm LEFT ATRIUM             Index        RIGHT ATRIUM           Index LA diam:        4.00 cm 2.12 cm/m   RA Area:     14.80 cm LA Vol (A2C):   52.9 ml 28.05 ml/m  RA Volume:   38.50 ml  20.41 ml/m LA Vol (A4C):   54.0 ml 28.63 ml/m LA Biplane Vol: 55.5 ml 29.43 ml/m  AORTIC VALVE  PULMONIC VALVE AV Area (Vmax):    1.66 cm      PV Vmax:       1.05 m/s AV Area (Vmean):   1.69 cm      PV Peak grad:  4.4 mmHg AV Area (VTI):     2.03 cm AV Vmax:           174.00 cm/s AV Vmean:          118.000 cm/s AV VTI:            0.380 m AV Peak Grad:      12.1 mmHg AV Mean Grad:      6.0 mmHg LVOT Vmax:         102.00 cm/s LVOT Vmean:        70.400 cm/s LVOT VTI:          0.272 m LVOT/AV VTI ratio: 0.72  AORTA Ao Root diam: 3.20 cm MITRAL VALVE                TRICUSPID VALVE MV Area (PHT): 2.92 cm     TR Peak grad:   17.0 mmHg MV Area VTI:   2.38 cm     TR Vmax:        206.00 cm/s MV Peak grad:  4.9 mmHg MV Mean grad:  2.0 mmHg     SHUNTS MV Vmax:       1.11 m/s     Systemic VTI:  0.27 m MV Vmean:      63.0 cm/s    Systemic Diam: 1.90 cm MV Decel Time: 260 msec MV E velocity: 73.70 cm/s MV A velocity: 104.00 cm/s MV E/A ratio:  0.71 Rudean Haskell MD Electronically signed by Rudean Haskell MD Signature Date/Time: 12/08/2021/2:14:12 PM    Final    CT Angio Chest Pulmonary Embolism (PE) W  or WO Contrast  Result Date: 12/08/2021 CLINICAL DATA:  Positive D-dimer.  DVT EXAM: CT ANGIOGRAPHY CHEST WITH CONTRAST TECHNIQUE: Multidetector CT imaging of the chest was performed using the standard protocol during bolus administration of intravenous contrast. Multiplanar CT image reconstructions and MIPs were obtained to evaluate the vascular anatomy. RADIATION DOSE REDUCTION: This exam was performed according to the departmental dose-optimization program which includes automated exposure control, adjustment of the mA and/or kV according to patient size and/or use of iterative reconstruction technique. CONTRAST:  4m OMNIPAQUE IOHEXOL 350 MG/ML SOLN COMPARISON:  11/28/2021 FINDINGS: Cardiovascular: Satisfactory opacification of the pulmonary arteries to the segmental level. No evidence of pulmonary embolism. Normal heart size. No pericardial effusion. Extensive atheromatous calcification of the aorta and coronaries Mediastinum/Nodes: Negative for adenopathy. Lungs/Pleura: Unchanged pattern of multifocal consolidation affecting all lobes with ground-glass, nodular, and consolidative opacities. Lower lobe airspace opacity is new/progressed from July 2023 but remaining opacity is very similar. Central airways are clear. Mild emphysematous change. In an otherwise clear area of lung there is an 8 mm irregular pulmonary nodule which is known from prior CTs. Upper Abdomen: Atheromatous plaque. Dystrophic type calcification in the right posterior liver. Granulomatous calcifications in the spleen. Musculoskeletal: No acute finding. Generalized thoracic spine degeneration. Review of the MIP images confirms the above findings. IMPRESSION: 1. Negative for pulmonary embolism. 2. Multi lobar airspace disease much of which is chronic when compared to a July 2023 CT. Lingular opacity is definitely chronic when compared to a January 2023 CT, and progressive. Consider atypical and non infectious pneumonias. Consider alveolar  tumor. 3. Known spiculated nodule in the right upper lobe. Electronically Signed  By: Jorje Guild M.D.   On: 12/08/2021 12:02   US Venous Img Lower Bilateral (DVT)  Result Date: 12/08/2021 CLINICAL DATA:  Bilateral lower extremity edema. EXAM: BILATERAL LOWER EXTREMITY VENOUS DOPPLER ULTRASOUND TECHNIQUE: Gray-scale sonography with graded compression, as well as color Doppler and duplex ultrasound were performed to evaluate the lower extremity deep venous systems from the level of the common femoral vein and including the common femoral, femoral, profunda femoral, popliteal and calf veins including the posterior tibial, peroneal and gastrocnemius veins when visible. The superficial great saphenous vein was also interrogated. Spectral Doppler was utilized to evaluate flow at rest and with distal augmentation maneuvers in the common femoral, femoral and popliteal veins. COMPARISON:  None Available. FINDINGS: RIGHT LOWER EXTREMITY Common Femoral Vein: No evidence of thrombus. Normal compressibility, respiratory phasicity and response to augmentation. Saphenofemoral Junction: No evidence of thrombus. Normal compressibility and flow on color Doppler imaging. Profunda Femoral Vein: No evidence of thrombus. Normal compressibility and flow on color Doppler imaging. Femoral Vein: No evidence of thrombus. Normal compressibility, respiratory phasicity and response to augmentation. Popliteal Vein: No evidence of thrombus. Normal compressibility, respiratory phasicity and response to augmentation. Calf Veins: Normal patency of posterior tibial vein. There is some visualized thrombus in the right peroneal vein which appears occlusive in the proximal calf. Gastrocnemius thrombus identified in the right calf. Superficial Great Saphenous Vein: No evidence of thrombus. Normal compressibility. Venous Reflux:  None. Other Findings: No evidence of superficial thrombophlebitis or abnormal fluid collection. LEFT LOWER EXTREMITY  Common Femoral Vein: No evidence of thrombus. Normal compressibility, respiratory phasicity and response to augmentation. Saphenofemoral Junction: No evidence of thrombus. Normal compressibility and flow on color Doppler imaging. Profunda Femoral Vein: No evidence of thrombus. Normal compressibility and flow on color Doppler imaging. Femoral Vein: No evidence of thrombus. Normal compressibility, respiratory phasicity and response to augmentation. Popliteal Vein: No evidence of thrombus. Normal compressibility, respiratory phasicity and response to augmentation. Calf Veins: Posterior tibial vein is normally patent. Left peroneal vein DVT visualized. Superficial Great Saphenous Vein: No evidence of thrombus. Normal compressibility. Venous Reflux:  None. Other Findings: No evidence of superficial thrombophlebitis or abnormal fluid collection. IMPRESSION: Positive for bilateral calf DVT in the peroneal veins and right gastrocnemius vein. Electronically Signed   By: Aletta Edouard M.D.   On: 12/08/2021 11:50   DG Chest Port 1 View  Result Date: 12/07/2021 CLINICAL DATA:  Questionable sepsis. Evaluate for abnormality. Hypoxemia. History of asthma/COPD. EXAM: PORTABLE CHEST 1 VIEW COMPARISON:  Radiographs 11/29/2021 and 11/28/2021.  CT 11/28/2021. FINDINGS: 1509 hours. The heart size and mediastinal contours appear stable. Compared with the most recent studies, there is partial clearing of the bibasilar airspace opacities, most consistent with improving pneumonia. No pneumothorax or significant pleural effusion identified. No acute osseous findings are seen. Telemetry leads overlie the chest. There is multilevel thoracic spondylosis. IMPRESSION: Interval partial clearing of bibasilar airspace opacities, most consistent with improving pneumonia. Continued follow-up recommended to document complete clearing. No new findings identified. Electronically Signed   By: Richardean Sale M.D.   On: 12/07/2021 15:27   DG Chest  1 View  Result Date: 11/29/2021 CLINICAL DATA:  Shortness of breath EXAM: CHEST  1 VIEW COMPARISON:  Chest x-ray 11/28/2021. FINDINGS: Cardiomediastinal silhouette is within normal limits. There is increasing patchy airspace disease in the bilateral mid and lower lungs. There is no pleural effusion or pneumothorax. No acute fractures are seen. There is a healed left humeral neck fracture. IMPRESSION: Increasing patchy airspace disease  in the bilateral mid and lower lungs, concerning for multifocal pneumonia. Electronically Signed   By: Ronney Asters M.D.   On: 11/29/2021 20:36   CT Angio Chest PE W and/or Wo Contrast  Result Date: 11/28/2021 CLINICAL DATA:  PE suspected.  Low O2 sats. EXAM: CT ANGIOGRAPHY CHEST WITH CONTRAST TECHNIQUE: Multidetector CT imaging of the chest was performed using the standard protocol during bolus administration of intravenous contrast. Multiplanar CT image reconstructions and MIPs were obtained to evaluate the vascular anatomy. RADIATION DOSE REDUCTION: This exam was performed according to the departmental dose-optimization program which includes automated exposure control, adjustment of the mA and/or kV according to patient size and/or use of iterative reconstruction technique. CONTRAST:  41m OMNIPAQUE IOHEXOL 350 MG/ML SOLN COMPARISON:  CT chest 09/20/2021 FINDINGS: Cardiovascular: Satisfactory opacification of the pulmonary arteries to the segmental level. No evidence of pulmonary embolism. Normal heart size. No pericardial effusion. Calcific aortic atherosclerosis. Multivessel coronary artery calcification. Mediastinum/Nodes: No enlarged mediastinal, hilar, or axillary lymph nodes. Thyroid gland and trachea demonstrate no significant findings. Patulous esophagus. Lungs/Pleura: A 1.0 x 0.8 cm irregular nodule in the anterior right upper lobe appears subsolid on today's exam (series 6, image 57). Irregular consolidation with air bronchograms and surrounding ground-glass  opacities dependently located in the left upper lobe is not significantly changed compared to 09/20/2021. Airspace consolidation with air bronchograms dependently located in the right upper and right middle lobes with adjacent ground-glass opacities are increased compared to 09/20/2021. Similarly, patchy consolidations with surrounding ground-glass opacities in the lower lungs are also increased compared to 09/16/2021, particularly in the right lower lobe. No pleural effusion or pneumothorax. Upper Abdomen: No acute abnormality. Benign left hepatic cyst again noted. No follow-up imaging indicated. Musculoskeletal: No chest wall abnormality. No acute or suspicious osseous findings. Multilevel degenerative changes in the thoracic and visualized upper lumbar spine. Review of the MIP images confirms the above findings. IMPRESSION: 1. No pulmonary embolism. 2. Multifocal, dependently located, consolidations bilaterally, favored to represent multifocal pneumonia, possibly aspiration related. Recommend follow-up CT imaging in 3 months after treatment to ensure resolution. 3. The 1 cm nodule in the anterior right upper lobe appears subsolid on today's exam. As previously noted, bronchogenic carcinoma cannot be excluded. Recommend attention on follow-up imaging. 4. Multivessel coronary artery disease. Aortic Atherosclerosis (ICD10-I70.0). Electronically Signed   By: LIleana RoupM.D.   On: 11/28/2021 16:15   DG Chest Port 1 View  Result Date: 11/28/2021 CLINICAL DATA:  Dyspnea, shaking, hypoxia, history COPD, asthma, diabetes mellitus, hypertension EXAM: PORTABLE CHEST 1 VIEW COMPARISON:  Portable exam 1304 hours compared to 10/31/2021 FINDINGS: Upper normal size of cardiac silhouette. Mediastinal contours and pulmonary vascularity normal. Atherosclerotic calcification aorta. Bibasilar infiltrates question multifocal pneumonia. Minimal pleural effusions. No pneumothorax. Bones demineralized with posttraumatic deformity  proximal LEFT humerus and multilevel degenerative disc disease changes/scoliosis thoracic spine. IMPRESSION: Bibasilar pulmonary infiltrates question multifocal pneumonia. Minimal pleural effusions. Aortic Atherosclerosis (ICD10-I70.0). Electronically Signed   By: MLavonia DanaM.D.   On: 11/28/2021 13:15    DOrson Eva DO  [facility]  If 7PM-7AM, please contact night-coverage [email or web address] Password [email or web address] 12/17/2021, 5:08 PM   LOS: 10 days

## 2021-12-17 NOTE — Inpatient Diabetes Management (Signed)
Inpatient Diabetes Program Recommendations  AACE/ADA: New Consensus Statement on Inpatient Glycemic Control (2015)  Target Ranges:  Prepandial:   less than 140 mg/dL      Peak postprandial:   less than 180 mg/dL (1-2 hours)      Critically ill patients:  140 - 180 mg/dL   Lab Results  Component Value Date   GLUCAP 263 (H) 12/17/2021   HGBA1C 6.3 (H) 12/08/2021    Review of Glycemic Control   Latest Reference Range & Units 12/16/21 07:30 12/16/21 11:36 12/16/21 16:38 12/16/21 20:05 12/17/21 03:03 12/17/21 03:46 12/17/21 07:30  Glucose-Capillary 70 - 99 mg/dL 232 (H) 190 (H) 117 (H) 266 (H) 281 (H) 272 (H) 263 (H)   Diabetes history: DM 2 Outpatient Diabetes medications: Amaryl 4 mg Daily, Lantus 20 units bid, Janumet 50-1000 mg bid Current orders for Inpatient glycemic control:  Semglee 60 units Daily Novolog 18 units tid meal coverage Novolog 0-15 units tid  Glucerna tid between meals Solumedrol 80 mg Q12 hours  Inpatient Diabetes Program Recommendations:    If steroid dose remains the same consider: -  Increasing Semglee to 35 units bid -  Add Novolog hs scale  Thanks,  Tama Headings RN, MSN, BC-ADM Inpatient Diabetes Coordinator Team Pager [phone] (8a-5p)

## 2021-12-17 NOTE — Progress Notes (Signed)
Daily Progress Note   Patient Name: Michaela Moon       Date: 12/17/2021 DOB: 1935-09-27  Age: 86 y.o. MRN#: [id number] Attending Physician: Orson Eva, MD Primary Care Physician: [name], MD Admit Date: 12/07/2021 Length of Stay: 10 days  Reason for Consultation/Follow-up: Establishing goals of care  HPI/Patient Profile:  86 y.o. female  with past medical history of hypertension, T2DM, COPD, dysphagia requiring dilation who presents to the emergency department due to shortness of breath.     She was recently admitted 10/4-10/10 for acute on chronic hypoxemic respiratory failure secondary to multifocal pneumonia treated with IV Rocephin and azithromycin.  She was discharged home and shortly thereafter became hypoxic despite 5 L/min nasal cannula (baseline is 2 L/min) and subsequently brought to the ED. She was admitted on 12/07/2021 with acute on chronic respiratory failure with hypoxia, multifocal pneumonia, lactic acidosis, dysphagia, and others.  Subjective:   Subjective: Chart Reviewed. Updates received. Patient Assessed. Created space and opportunity for patient  and family to explore thoughts and feelings regarding current medical situation.  Today's Discussion: Today met with the patient at the bedside, she is in the bed. She is again awake and interactive.  She is currently only requiring 5 L/min standard oxygen flow.  She states that she feels a lot better.  She is not having any dyspnea currently.  She also denies pain, nausea, vomiting.  Her vitals are stable, sats currently 96%.  Follows visiting the patient's son was asleep in the visitors chair.  The patient discussed how blessed she is to have her son there, states that he has been there almost around-the-clock.  I declined to wake him up but encouraged her to encourage him to make sure he is taking care of himself as well.  She agreed.  I discussed how happy it makes me to see that she is making steady progress.  She  is hopeful for improvement and transfer to floor and eventual discharge back home.  I confirmed continued desire for DNR status.  I provided emotional and general support through therapeutic listening, empathy, sharing of stories, and other techniques. I answered all questions and addressed all concerns to the best of my ability.  Review of Systems  Constitutional:        Denies pain in general  Respiratory:  Positive for shortness of breath (improved).   Gastrointestinal:  Negative for abdominal pain, nausea and vomiting.    Objective:   Vital Signs:  BP (!) 131/29   Pulse 89   Temp 98 F (36.7 C) (Oral)   Resp (!) 22   Ht _0  (1.651 m)   Wt 80.7 kg   SpO2 94%   BMI 29.60 kg/m   Physical Exam: Physical Exam Vitals and nursing note reviewed.  Constitutional:      General: She is not in acute distress.    Appearance: She is ill-appearing.  HENT:     Head: Normocephalic and atraumatic.  Cardiovascular:     Rate and Rhythm: Normal rate.  Pulmonary:     Effort: Pulmonary effort is normal. No respiratory distress.  Abdominal:     General: Abdomen is flat.     Palpations: Abdomen is soft.  Skin:    General: Skin is warm and dry.  Neurological:     General: No focal deficit present.     Mental Status: She is alert.  Psychiatric:        Mood and Affect: Mood normal.  Behavior: Behavior normal.     Palliative Assessment/Data: 50%   Assessment & Plan:   Impression: Present on Admission:  Acute on chronic respiratory failure with hypoxia (HCC)  GERD (gastroesophageal reflux disease)  Acute exacerbation of chronic obstructive pulmonary disease (COPD) (HCC)  Essential hypertension  Mixed hyperlipidemia  Multifocal pneumonia  Lactic acidosis  Iron deficiency anemia  Hypoalbuminemia due to protein-calorie malnutrition (HCC)  Elevated brain natriuretic peptide (BNP) level  86 year old female with repeated admissions for pneumonia, possible aspiration with  recommended dysphagia 3 diet.  She has required increasing levels of oxygen but is steadily improved, now off heated high flow and on 15 L/min regular oxygen.  Overall long-term prognosis remains poor, but more optimistic for survival of this hospital stay.  Continues to steadily improve.  SUMMARY OF RECOMMENDATIONS   Remain DNR Continue to treat the treatable Further goals of care discussions pending evolution of clinical picture Continued patient and family emotional support PMT will continue to follow, will follow-up in 2 days for progress   Symptom Management:  Per primary team PMT is available to assist as needed  Code Status: DNR  Prognosis: Unable to determine  Discharge Planning: To Be Determined  Discussed with: Patient, family, medical team, nursing team  Thank you for allowing [place] to participate in the care of Michaela Moon PMT will continue to support holistically.  Billing based on MDM: High  Problems Addressed: One acute or chronic illness or injury that poses a threat to life or bodily function  Amount and/or Complexity of Data: Category 3:Discussion of management or test interpretation with external physician/other qualified health care professional/appropriate source (not separately reported)  Risks: Decision not to resuscitate or to de-escalate care because of poor prognosis (confirmed continued DNR desire)   Walden Field, NP Palliative Medicine Team  Team Phone # [phone] (Nights/Weekends)  10/24/2020, 8:17 AM

## 2021-12-18 ENCOUNTER — Inpatient Hospital Stay (HOSPITAL_COMMUNITY): Payer: Medicare Other

## 2021-12-18 DIAGNOSIS — I5031 Acute diastolic (congestive) heart failure: Secondary | ICD-10-CM | POA: Diagnosis not present

## 2021-12-18 DIAGNOSIS — J441 Chronic obstructive pulmonary disease with (acute) exacerbation: Secondary | ICD-10-CM | POA: Diagnosis not present

## 2021-12-18 DIAGNOSIS — J9621 Acute and chronic respiratory failure with hypoxia: Secondary | ICD-10-CM | POA: Diagnosis not present

## 2021-12-18 DIAGNOSIS — R1314 Dysphagia, pharyngoesophageal phase: Secondary | ICD-10-CM

## 2021-12-18 LAB — GLUCOSE, CAPILLARY
Glucose-Capillary: 146 mg/dL — ABNORMAL HIGH (ref 70–99)
Glucose-Capillary: 164 mg/dL — ABNORMAL HIGH (ref 70–99)
Glucose-Capillary: 166 mg/dL — ABNORMAL HIGH (ref 70–99)
Glucose-Capillary: 121 mg/dL — ABNORMAL HIGH (ref 70–99)
Glucose-Capillary: 116 mg/dL — ABNORMAL HIGH (ref 70–99)
Glucose-Capillary: 122 mg/dL — ABNORMAL HIGH (ref 70–99)

## 2021-12-18 LAB — BASIC METABOLIC PANEL
Anion gap: 5 (ref 5–15)
BUN: 48 mg/dL — ABNORMAL HIGH (ref 8–23)
CO2: 30 mmol/L (ref 22–32)
Calcium: 10.4 mg/dL — ABNORMAL HIGH (ref 8.9–10.3)
Chloride: 101 mmol/L (ref 98–111)
Creatinine, Ser: 0.72 mg/dL (ref 0.44–1.00)
GFR, Estimated: >60 mL/min (ref >60)
Glucose, Bld: 169 mg/dL — ABNORMAL HIGH (ref 70–99)
Potassium: 4.9 mmol/L (ref 3.5–5.1)
Sodium: 136 mmol/L (ref 135–145)

## 2021-12-18 LAB — MAGNESIUM: Magnesium: 2.1 mg/dL (ref 1.7–2.4)

## 2021-12-18 NOTE — Progress Notes (Signed)
PROGRESS NOTE  JAYLIANI WANNER FXT:[id number] DOB: Jul 05, 1935 DOA: 12/07/2021 PCP: [name], MD  Brief History:  86 y.o. female with medical history significant of hypertension, T2DM, COPD, dysphagia requiring dilation who presents to the emergency department due to shortness of breath. She was recently admitted from 10/4 to 10/10 due to acute on chronic hypoxemic respiratory failure secondary to multifocal pneumonia which was treated with IV Rocephin and azithromycin.  She completed a course of antibiotics while still in the hospital and O2 sat was weaned down to 4 to 5 L via [place].  It appeared that patient was on supplemental oxygen at 2 LPM on returning home after discharge.  Home oxygen was being monitored at home and when the home health nurse went to see patient yesterday, she was slightly hypoxic, home oxygen was monitored within the past 24 hours and was noted to have dropped down into the low 80s and the oxygen was increased to 5 L/min via [place], despite this, patient was still hypoxic with an O2 sat of 82% on arrival to the ED Patient was also recently admitted to [facility] from 9/2 to 9/12 due to multifocal pneumonia and acute respiratory failure with hypoxia due to COVID-19 during which she was transferred to ICU and she required BiPAP, she was treated with Paxlovid for 5 days and she continued Decadron and empiric antibiotic.  She was discharged with supplemental oxygen via [place] at 2 LPM   At bedside, patient was requiring supplemental oxygen via HFNC at 15 L, she was not in any acute distress.  There was concern regarding food getting stuck in her throat/chest.   ED Course:  In the emergency department, she was intermittently tachypneic, BP was 131/57, temperature 97.41F, pulse 100 bpm, O2 sat 95% on NRB.  Work-up in the ED showed WBC 15.3, hemoglobin 8.8, hematocrit 32.8, MCV 82.2, platelets 208.  BMP showed sodium 139, potassium 3.7, chloride 107, bicarb 24, glucose 88, BUN  11, creatinine 0.77.  Lactic acid 2.8 > 2.3, BNP 116 (this was 329 about 9 days ago).  Influenza A, B, SARS coronavirus 2 was negative.  Blood culture was pending.  Chest x-ray showed Interval partial clearing of bibasilar airspace opacities, most consistent with improving pneumonia. Solu-Medrol 25 mg x 1 was given, she was started on IV ceftriaxone and azithromycin, IV hydration was provided.  Hospitalist was asked to admit patient for further evaluation and management. She finished 5 days of ceftriaxone and azithromycin.  She continued to have high oxygenation requirements up to 30L HFNC at 50%.  PCCM was consulted to assist.  The patient was continued on IV solumedrol. There was a component of fluid overload.  The patient was started on IV lasix.  She began to slowly improve with decreasing oxygenation demand.     Assessment/Plan: Acute on chronic respiratory failure with hypoxia O2 sat dropped to 80s on supplemental oxygen via [place] at 5 LPM and required up to [facility] at 30 L/min at 100% to maintain sats Goal pulse ox is >87% no need for pulse ox to be 100%  On 2L at home Weaning from 30L at 50% FiO2>>8L>>6L Pulm signed off   Acute exacerbation of COPD 10/14 CTA chest--negative for PE but reports concern for atypical/noninfectious pneumonia--interstitial & alveolar opacities Appreciate pulmonology recommendations.  Continue duo nebs, Mucinex, Solu-Medrol, Singulair  Finished 5 days ceftriaxone/azithromycin. Continue Protonix to prevent steroid-induced ulcer Continue incentive spirometry and flutter valve 12/08/21 TTE:  LVEF 60-65% , no WMA, G1 DD, normal RVF 10/19--weaning steroids Added edyupelri   Acute diastolic CHF -continue IV lasix -personally reviewed CXR--increased interstitial markings -accurate I/O--incomplete -daily weights   Lobar pneumonia  Chest CTA--no PE; concern for noninfectious/atypical pneumonia  Consulted pulmonology on 10/16.  Procalcitonin <0.10 Patient was  started on ceftriaxone and azithromycin--finished 5 d Continue Tylenol as needed Continue Mucinex, incentive spirometry, flutter valve    Bilateral Calf DVT Confirmed by venous US 10/14 TED  [facility]oses Apixaban ordered  PE was ruled out by CTA  -may need to hold apixaban if hemoptysis worsens -Hgb stable   Lactic acidosis  -from hypoxia Lactic acid 2.8 > 2.3 trended down   Dysphagia Last EGD on 04/2016 by Dr. Oneida Alar  showed no endoscopic esophageal abnormality to explain patient's dysphagia. Esophagus dilated per medical record Seen by speech--dys 3 with thin   T2DM with steroid induced hyperglycemia  12/08/21 A1C--6.3 Continue ISS and hypoglycemia protocol Increase Semglee 35 units bid plus novolog 18 units TID with meals, plus supplemental coverage and frequent CBG monitoring   Iron deficiency anemia Continue ferrous sulfate   Essential hypertension Continue losartan, Lopressor, add hydralazine    Mixed hyperlipidemia Continue Lipitor   GERD Continue Protonix   Peripheral neuropathy Continue Neurontin     Family Communication:  son at bedside 10/24   Consultants:  pulm   Code Status:  DNR   DVT Prophylaxis:  apixaban     Procedures: As Listed in Progress Note Above   Antibiotics: Ceftriaxone 10/13>>10/17 Azithro 10/13>>10/17      Subjective: Patient states she is breathing better, but still rattling.  Denies f/c, cp, n/v/d.    Objective: Vitals:   12/18/21 1455 12/18/21 1500 12/18/21 1600 12/18/21 1641  BP:  (!) 122/42 (!) 106/30   Pulse:  71 72   Resp:  16 12   Temp:    97.9 F (36.6 C)  TempSrc:    Oral  SpO2: 94% 97% 95%   Weight:      Height:        Intake/Output Summary (Last 24 hours) at 12/18/2021 1727 Last data filed at 12/18/2021 0411 Gross per 24 hour  Intake 273 ml  Output 400 ml  Net -127 ml   Weight change: 0.005 kg Exam:  General:  Pt is alert, follows commands appropriately, not in acute distress HEENT: No icterus, No  thrush, No neck mass, [place]/AT Cardiovascular: RRR, S1/S2, no rubs, no gallops Respiratory: bilateral rhonchi Abdomen: Soft/+BS, non tender, non distended, no guarding Extremities: Nonpitting edema, No lymphangitis, No petechiae, No rashes, no synovitis   Data Reviewed: I have personally reviewed following labs and imaging studies Basic Metabolic Panel: Recent Labs  Lab 12/13/21 0308 12/14/21 0318 12/15/21 1252 12/16/21 0340 12/17/21 0340 12/18/21 0355  NA 136 138 133* 136 134* 136  K 4.3 4.4 5.6* 5.0 4.8 4.9  CL 103 102 100 103 101 101  CO2 '27 29 27 26 26 30  '$ GLUCOSE 210* 106* 199* 190* 289* 169*  BUN 29* 39* 49* 45* 38* 48*  CREATININE 0.58 0.70 0.89 0.68 0.67 0.72  CALCIUM 10.4* 10.8* 10.2 10.3 9.9 10.4*  MG 1.7 2.0  --   --  2.1 2.1   Liver Function Tests: Recent Labs  Lab 12/16/21 0340  AST 41  ALT 78*  ALKPHOS 37*  BILITOT 0.4  PROT 5.7*  ALBUMIN 2.8*   No results for input(s): "LIPASE", "AMYLASE" in the last 168 hours. No results for input(s): "AMMONIA" in  the last 168 hours. Coagulation Profile: No results for input(s): "INR", "PROTIME" in the last 168 hours. CBC: Recent Labs  Lab 12/12/21 0324 12/14/21 0318 12/16/21 0340 12/17/21 0340  WBC 14.1* 14.1* 16.8* 12.0*  HGB 9.4* 10.1* 9.3* 9.3*  HCT 31.5* 33.3* 30.8* 30.2*  MCV 82.9 82.6 84.2 83.9  PLT 177 185 156 150   Cardiac Enzymes: No results for input(s): "CKTOTAL", "CKMB", "CKMBINDEX", "TROPONINI" in the last 168 hours. BNP: Invalid input(s): "POCBNP" CBG: Recent Labs  Lab 12/18/21 0254 12/18/21 0410 12/18/21 0733 12/18/21 1106 12/18/21 1618  GLUCAP 146* 164* 166* 121* 116*   HbA1C: No results for input(s): "HGBA1C" in the last 72 hours. Urine analysis: No results found for: "COLORURINE", "APPEARANCEUR", "LABSPEC", "PHURINE", "GLUCOSEU", "HGBUR", "BILIRUBINUR", "KETONESUR", "PROTEINUR", "UROBILINOGEN", "NITRITE", "LEUKOCYTESUR" Sepsis  Labs: '@LABRCNTIP'$ (procalcitonin:4,lacticidven:4) ) Recent Results (from the past 240 hour(s))  MRSA Next Gen by PCR, Nasal     Status: None   Collection Time: 12/10/21  5:50 PM   Specimen: Nasal Mucosa; Nasal Swab  Result Value Ref Range Status   MRSA by PCR Next Gen NOT DETECTED NOT DETECTED Final    Comment: (NOTE) The GeneXpert MRSA Assay (FDA approved for NASAL specimens only), is one component of a comprehensive MRSA colonization surveillance program. It is not intended to diagnose MRSA infection nor to guide or monitor treatment for MRSA infections. Test performance is not FDA approved in patients less than 62 years old. Performed at [facility], [address], [place], [place] [address]      Scheduled Meds:  apixaban  5 mg Oral BID   aspirin  81 mg Oral Daily   atorvastatin  80 mg Oral Daily   Chlorhexidine Gluconate Cloth  6 each Topical Q0600   clotrimazole   Topical BID   docusate sodium  100 mg Oral Daily   feeding supplement (GLUCERNA SHAKE)  237 mL Oral TID BM   ferrous sulfate  325 mg Oral Daily   furosemide  40 mg Intravenous Daily   gabapentin  100 mg Oral TID   hydrALAZINE  50 mg Oral Q8H   insulin aspart  0-15 Units Subcutaneous TID WC   insulin aspart  0-5 Units Subcutaneous QHS   insulin aspart  18 Units Subcutaneous TID WC   insulin glargine-yfgn  35 Units Subcutaneous BID   ipratropium-albuterol  3 mL Nebulization TID   losartan  100 mg Oral Daily   methylPREDNISolone (SOLU-MEDROL) injection  40 mg Intravenous Q12H   metoprolol tartrate  50 mg Oral BID   mometasone-formoterol  2 puff Inhalation BID   montelukast  10 mg Oral QHS   pantoprazole  40 mg Oral Daily   polyethylene glycol  17 g Oral Daily   polyvinyl alcohol  1 drop Both Eyes QID   revefenacin  175 mcg Nebulization Daily   senna  2 tablet Oral Daily   Continuous Infusions:  Procedures/Studies: DG Swallowing Func-Speech Pathology  Result Date: 12/18/2021 Table formatting from the  original result was not included. Objective Swallowing Evaluation: Type of Study: MBS-Modified Barium Swallow Study  Patient Details Name: ANETA HENDERSHOTT MRN: [id number] Date of Birth: 09-27-1935 Today's Date: 12/18/2021 Time: SLP Start Time (ACUTE ONLY): 1419 -SLP Stop Time (ACUTE ONLY): 1448 SLP Time Calculation (min) (ACUTE ONLY): 29 min Past Medical History: Past Medical History: Diagnosis Date  Arthritis   Asthma   COPD (chronic obstructive pulmonary disease) ([place])   Diabetes mellitus without complication (HCC)   GERD (gastroesophageal reflux disease)   Headache  Hyperlipidemia   Hypertension   Macular degeneration   Neuropathy   Obesity   Osteopenia   Stress incontinence  Past Surgical History: Past Surgical History: Procedure Laterality Date  BREAST LUMPECTOMY Bilateral   no cancer  CARPAL TUNNEL RELEASE Left 10/23/2017  Procedure: LEFT CARPAL TUNNEL RELEASE;  Surgeon: Carole Civil, MD;  Location: [facility];  Service: Orthopedics;  Laterality: Left;  CARPAL TUNNEL RELEASE Right 11/20/2017  Procedure: CARPAL TUNNEL RELEASE;  Surgeon: Carole Civil, MD;  Location: [facility];  Service: Orthopedics;  Laterality: Right;  CATARACT EXTRACTION W/PHACO Left 11/01/2016  Procedure: CATARACT EXTRACTION PHACO AND INTRAOCULAR LENS PLACEMENT (IOC);  Surgeon: Baruch Goldmann, MD;  Location: [facility];  Service: Ophthalmology;  Laterality: Left;  CDE: 3.83  CATARACT EXTRACTION W/PHACO Right 11/29/2016  Procedure: CATARACT EXTRACTION PHACO AND INTRAOCULAR LENS PLACEMENT RIGHT EYE;  Surgeon: Baruch Goldmann, MD;  Location: [facility];  Service: Ophthalmology;  Laterality: Right;  CDE: 8.71  colon tumor removed  2010  Dr. 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 submucosal lipoma on path  COLONOSCOPY  05/2013  Dr. Anthony Sar: normal. (h/o colon polyps)  COLONOSCOPY  2011  normal  COLONOSCOPY  10/2003  Dr. Lindalou Hose: large polypoid mass hepatic fluexure  ESOPHAGOGASTRODUODENOSCOPY N/A 04/29/2016   Procedure: ESOPHAGOGASTRODUODENOSCOPY (EGD);  Surgeon: Danie Binder, MD;  Location: [facility];  Service: Endoscopy;  Laterality: N/A;  9:15am  SAVORY DILATION N/A 04/29/2016  Procedure: SAVORY DILATION;  Surgeon: Danie Binder, MD;  Location: [facility];  Service: Endoscopy;  Laterality: N/A; HPI: 86 y.o. female with medical history significant of hypertension, T2DM, COPD, dysphagia requiring dilation who presents to the emergency department due to shortness of breath.  She was recently admitted from 10/4 to 10/10 due to acute on chronic hypoxemic respiratory failure secondary to multifocal pneumonia which was treated with IV Rocephin and azithromycin. Prior to this, she was admitted at [facility]- admitted 9/2 to 9/12 for acute respiratory failure, required BiPAP.  She was positive for COVID and treated with antibiotics and Paxlovid for multifocal pneumonia. [name] reports to me she has had PNA ~5-7 times in the past 2 years. CXR reveals: "Interval partial clearing of bibasilar airspace opacities, most  consistent with improving pneumonia. Continued follow-up recommended  to document complete clearing. No new findings identified." BSE requested.  Subjective: "I sometimes have trouble swallowing solid foods."  Recommendations for follow up therapy are one component of a multi-disciplinary discharge planning process, led by the attending physician.  Recommendations may be updated based on patient status, additional functional criteria and insurance authorization. Assessment / Plan / Recommendation   12/18/2021   2:00 PM Clinical Impressions Clinical Impression  Pt presents with oropharyngeal swallowing to be essentially within functional limits. Note occasional pharyngeal penetration with thin liquids via cup and more frequent and deep/frank penetration with thin via straw, however all penetrates are during the swallow and are completely cleared by pharyngeal squeeze and pharyngeal stripping wave. Puree  and regular textures are consumed without incident. Pt reports that she takes meds with puree at home; barium tablet was assessed with puree and note breif stasis of the tablet in the cervical esophagus visualized during esophageal sweep (no radiologist present to confirm) but an additional presentation of puree facilitated tablet passing through. Otherwise esophageal sweep was unremarkable. Recommend continue with a soft diet/D3 and thin liquids, however secondary to respiratory compromise and current deconditioning recommend avoid straws at this time. Recommend meds whole with puree  and recommend universal aspiration precautions and esophageal precations. Above findings and recommendations reviewed with RN, Pt and [name]. There are no further ST needs noted at this time. ST will sign off SLP Visit Diagnosis Dysphagia, unspecified (R13.10) Impact on safety and function Mild aspiration risk     12/18/2021   2:00 PM Treatment Recommendations Treatment Recommendations Therapy as outlined in treatment plan below     12/18/2021   2:00 PM Prognosis Prognosis for Safe Diet Advancement Good   12/18/2021   2:00 PM Diet Recommendations SLP Diet Recommendations Thin liquid;Dysphagia 3 (Mech soft) solids Liquid Administration via Cup;Straw Medication Administration Whole meds with puree Compensations Slow rate     12/18/2021   2:00 PM Other Recommendations Oral Care Recommendations Oral care BID Other Recommendations Clarify dietary restrictions Follow Up Recommendations Follow physician's recommendations for discharge plan and follow up therapies   12/09/2021   3:00 PM Frequency and Duration  Speech Therapy Frequency (ACUTE ONLY) min 2x/week Treatment Duration 1 week     12/18/2021   2:00 PM Oral Phase Oral Phase [facility]    12/18/2021   2:00 PM Pharyngeal Phase Pharyngeal Phase Impaired Pharyngeal- Thin [place]aspoon WFL Pharyngeal- Thin Cup Penetration/Aspiration during swallow Pharyngeal- Thin Straw Penetration/Aspiration during  swallow    12/18/2021   2:00 PM Cervical Esophageal Phase  Cervical Esophageal Phase WFL Amelia H. Roddie Mc, CCC-SLP Speech Language Pathologist [name] 12/18/2021, 3:11 PM                     DG Chest 1 View  Result Date: 12/13/2021 CLINICAL DATA:  Shortness of breath EXAM: CHEST  1 VIEW COMPARISON:  12/11/2021 FINDINGS: Mild cardiomegaly and vascular congestion. Mild interstitial prominence and lower lobe airspace opacities. Findings similar to prior study. No visible effusions. No acute bony abnormality. Chronic healed proximal left humeral fracture. IMPRESSION: Cardiomegaly with vascular congestion and probable mild interstitial edema. Lower lobe airspace opacities are unchanged. Electronically Signed   By: Rolm Baptise M.D.   On: 12/13/2021 01:04   DG Chest Port 1 View  Result Date: 12/11/2021 CLINICAL DATA:  5626 with acute respiratory failure. EXAM: PORTABLE CHEST 1 VIEW COMPARISON:  12/08/2021 portable chest and chest CT. FINDINGS: 4:49 a.m. There is cardiomegaly again noted and mild central vascular prominence, with aortic atherosclerosis. Stable mediastinum. Interstitial and patchy alveolar opacities in the mid to lower lung fields continue to be seen, with a basal gradient. There are minimal pleural effusions. There is improvement in opacities in the base of both lungs but significant opacity remains and is otherwise unaltered. The upper 1/3 of the lungs remain generally clear. There is osteopenia and degenerative change thoracic spine, chronic healed fracture deformity proximal left humerus. IMPRESSION: Improved aeration at the base of both lungs. No other noteworthy change in the bilateral lung opacities and underlying interstitial prominence. Cardiomegaly. Electronically Signed   By: Telford Nab M.D.   On: 12/11/2021 06:29   DG CHEST PORT 1 VIEW  Result Date: 12/08/2021 CLINICAL DATA:  Respiratory failure EXAM: PORTABLE CHEST 1 VIEW COMPARISON:  12/07/2021 FINDINGS: No  significant change in [facility] portable chest radiograph. Cardiomegaly with bilateral interstitial and heterogeneous airspace opacity of the lung bases, and probable small layering pleural effusions. Partially imaged chronic fracture deformity of the proximal left humerus. IMPRESSION: No significant change in [facility] portable chest radiograph. Cardiomegaly with bilateral interstitial and heterogeneous airspace opacity of the lung bases, and probable small layering pleural effusions. Electronically Signed   By: Lanae Crumbly  Laqueta Carina M.D.   On: 12/08/2021 16:16   ECHOCARDIOGRAM COMPLETE  Result Date: 12/08/2021    ECHOCARDIOGRAM REPORT   Patient Name:   CAILY RAKERS Date of Exam: 12/08/2021 Medical Rec #:  [id number]       Height:       65.0 in Accession #:    [id number]      Weight:       178.8 lb Date of Birth:  11/17/1935        BSA:          1.886 m Patient Age:    43 years        BP:           160/42 mmHg Patient Gender: F               HR:           68 bpm. Exam Location:  [facility] Procedure: 2D Echo, Cardiac Doppler and Color Doppler Indications:    CHF  History:        Patient has no prior history of Echocardiogram examinations.                 COPD; Risk Factors:Hypertension, Diabetes and Dyslipidemia.  Sonographer:    Wenda Low Referring Phys: [id number] OLADAPO ADEFESO IMPRESSIONS  1. Left ventricular ejection fraction, by estimation, is 60 to 65%. The left ventricle has normal function. The left ventricle has no regional wall motion abnormalities. The left ventricular internal cavity size was mildly dilated. There is mild left ventricular hypertrophy of the septal segment. Left ventricular diastolic parameters are consistent with Grade I diastolic dysfunction (impaired relaxation).  2. Right ventricular systolic function is normal. The right ventricular size is normal. There is normal pulmonary artery systolic pressure.  3. The mitral valve is grossly normal. No evidence of mitral valve regurgitation.  4. The  aortic valve is tricuspid. Aortic valve regurgitation is not visualized. No aortic stenosis is present.  5. The inferior vena cava is normal in size with greater than 50% respiratory variability, suggesting right atrial pressure of 3 mmHg. Comparison(s): No prior Echocardiogram. FINDINGS  Left Ventricle: Left ventricular ejection fraction, by estimation, is 60 to 65%. The left ventricle has normal function. The left ventricle has no regional wall motion abnormalities. The left ventricular internal cavity size was mildly dilated. There is  mild left ventricular hypertrophy of the septal segment. Left ventricular diastolic parameters are consistent with Grade I diastolic dysfunction (impaired relaxation). Right Ventricle: The right ventricular size is normal. No increase in right ventricular wall thickness. Right ventricular systolic function is normal. There is normal pulmonary artery systolic pressure. The tricuspid regurgitant velocity is 2.06 m/s, and  with an assumed right atrial pressure of 3 mmHg, the estimated right ventricular systolic pressure is 53.2 mmHg. Left Atrium: Left atrial size was normal in size. Right Atrium: Right atrial size was normal in size. Pericardium: There is no evidence of pericardial effusion. Mitral Valve: The mitral valve is grossly normal. Mild mitral annular calcification. No evidence of mitral valve regurgitation. MV peak gradient, 4.9 mmHg. The mean mitral valve gradient is 2.0 mmHg. Tricuspid Valve: The tricuspid valve is normal in structure. Tricuspid valve regurgitation is not demonstrated. No evidence of tricuspid stenosis. Aortic Valve: The aortic valve is tricuspid. There is mild aortic valve annular calcification. Aortic valve regurgitation is not visualized. No aortic stenosis is present. Aortic valve mean gradient measures 6.0 mmHg. Aortic valve peak gradient measures 12.1 mmHg. Aortic valve area,  by VTI measures 2.03 cm. Pulmonic Valve: The pulmonic valve was normal in  structure. Pulmonic valve regurgitation is not visualized. No evidence of pulmonic stenosis. Aorta: The aortic root is normal in size and structure. Venous: The inferior vena cava is normal in size with greater than 50% respiratory variability, suggesting right atrial pressure of 3 mmHg. IAS/Shunts: No atrial level shunt detected by color flow Doppler.  LEFT VENTRICLE PLAX 2D LVIDd:         5.30 cm   Diastology LVIDs:         3.20 cm   LV e' medial:    10.00 cm/s LV PW:         1.20 cm   LV E/e' medial:  7.4 LV IVS:        1.00 cm   LV e' lateral:   8.05 cm/s LVOT diam:     1.90 cm   LV E/e' lateral: 9.2 LV SV:         77 LV SV Index:   41 LVOT Area:     2.84 cm  RIGHT VENTRICLE RV Basal diam:  3.30 cm RV Mid diam:    2.40 cm RV S prime:     15.70 cm/s TAPSE (M-mode): 3.0 cm LEFT ATRIUM             Index        RIGHT ATRIUM           Index LA diam:        4.00 cm 2.12 cm/m   RA Area:     14.80 cm LA Vol (A2C):   52.9 ml 28.05 ml/m  RA Volume:   38.50 ml  20.41 ml/m LA Vol (A4C):   54.0 ml 28.63 ml/m LA Biplane Vol: 55.5 ml 29.43 ml/m  AORTIC VALVE                     PULMONIC VALVE AV Area (Vmax):    1.66 cm      PV Vmax:       1.05 m/s AV Area (Vmean):   1.69 cm      PV Peak grad:  4.4 mmHg AV Area (VTI):     2.03 cm AV Vmax:           174.00 cm/s AV Vmean:          118.000 cm/s AV VTI:            0.380 m AV Peak Grad:      12.1 mmHg AV Mean Grad:      6.0 mmHg LVOT Vmax:         102.00 cm/s LVOT Vmean:        70.400 cm/s LVOT VTI:          0.272 m LVOT/AV VTI ratio: 0.72  AORTA Ao Root diam: 3.20 cm MITRAL VALVE                TRICUSPID VALVE MV Area (PHT): 2.92 cm     TR Peak grad:   17.0 mmHg MV Area VTI:   2.38 cm     TR Vmax:        206.00 cm/s MV Peak grad:  4.9 mmHg MV Mean grad:  2.0 mmHg     SHUNTS MV Vmax:       1.11 m/s     Systemic VTI:  0.27 m MV Vmean:      63.0 cm/s    Systemic Diam:  1.90 cm MV Decel Time: 260 msec MV E velocity: 73.70 cm/s MV A velocity: 104.00 cm/s MV E/A ratio:  0.71  Rudean Haskell MD Electronically signed by Rudean Haskell MD Signature Date/Time: 12/08/2021/2:14:12 PM    Final    CT Angio Chest Pulmonary Embolism (PE) W or WO Contrast  Result Date: 12/08/2021 CLINICAL DATA:  Positive D-dimer.  DVT EXAM: CT ANGIOGRAPHY CHEST WITH CONTRAST TECHNIQUE: Multidetector CT imaging of the chest was performed using the standard protocol during bolus administration of intravenous contrast. Multiplanar CT image reconstructions and MIPs were obtained to evaluate the vascular anatomy. RADIATION DOSE REDUCTION: This exam was performed according to the departmental dose-optimization program which includes automated exposure control, adjustment of the mA and/or kV according to patient size and/or use of iterative reconstruction technique. CONTRAST:  95m OMNIPAQUE IOHEXOL 350 MG/ML SOLN COMPARISON:  11/28/2021 FINDINGS: Cardiovascular: Satisfactory opacification of the pulmonary arteries to the segmental level. No evidence of pulmonary embolism. Normal heart size. No pericardial effusion. Extensive atheromatous calcification of the aorta and coronaries Mediastinum/Nodes: Negative for adenopathy. Lungs/Pleura: Unchanged pattern of multifocal consolidation affecting all lobes with ground-glass, nodular, and consolidative opacities. Lower lobe airspace opacity is new/progressed from July 2023 but remaining opacity is very similar. Central airways are clear. Mild emphysematous change. In an otherwise clear area of lung there is an 8 mm irregular pulmonary nodule which is known from prior CTs. Upper Abdomen: Atheromatous plaque. Dystrophic type calcification in the right posterior liver. Granulomatous calcifications in the spleen. Musculoskeletal: No acute finding. Generalized thoracic spine degeneration. Review of the MIP images confirms the above findings. IMPRESSION: 1. Negative for pulmonary embolism. 2. Multi lobar airspace disease much of which is chronic when compared to a  July 2023 CT. Lingular opacity is definitely chronic when compared to a January 2023 CT, and progressive. Consider atypical and non infectious pneumonias. Consider alveolar tumor. 3. Known spiculated nodule in the right upper lobe. Electronically Signed   By: JJorje GuildM.D.   On: 12/08/2021 12:02   [place]ous Img Lower Bilateral (DVT)  Result Date: 12/08/2021 CLINICAL DATA:  Bilateral lower extremity edema. EXAM: BILATERAL LOWER EXTREMITY VENOUS DOPPLER ULTRASOUND TECHNIQUE: Gray-scale sonography with graded compression, as well as color Doppler and duplex ultrasound were performed to evaluate the lower extremity deep venous systems from the level of the common femoral vein and including the common femoral, femoral, profunda femoral, popliteal and calf veins including the posterior tibial, peroneal and gastrocnemius veins when visible. The superficial great saphenous vein was also interrogated. Spectral Doppler was utilized to evaluate flow at rest and with distal augmentation maneuvers in the common femoral, femoral and popliteal veins. COMPARISON:  None Available. FINDINGS: RIGHT LOWER EXTREMITY Common Femoral Vein: No evidence of thrombus. Normal compressibility, respiratory phasicity and response to augmentation. Saphenofemoral Junction: No evidence of thrombus. Normal compressibility and flow on color Doppler imaging. Profunda Femoral Vein: No evidence of thrombus. Normal compressibility and flow on color Doppler imaging. Femoral Vein: No evidence of thrombus. Normal compressibility, respiratory phasicity and response to augmentation. Popliteal Vein: No evidence of thrombus. Normal compressibility, respiratory phasicity and response to augmentation. Calf Veins: Normal patency of posterior tibial vein. There is some visualized thrombus in the right peroneal vein which appears occlusive in the proximal calf. Gastrocnemius thrombus identified in the right calf. Superficial Great Saphenous Vein: No  evidence of thrombus. Normal compressibility. Venous Reflux:  None. Other Findings: No evidence of superficial thrombophlebitis or abnormal fluid collection. LEFT LOWER EXTREMITY Common Femoral Vein: No evidence  of thrombus. Normal compressibility, respiratory phasicity and response to augmentation. Saphenofemoral Junction: No evidence of thrombus. Normal compressibility and flow on color Doppler imaging. Profunda Femoral Vein: No evidence of thrombus. Normal compressibility and flow on color Doppler imaging. Femoral Vein: No evidence of thrombus. Normal compressibility, respiratory phasicity and response to augmentation. Popliteal Vein: No evidence of thrombus. Normal compressibility, respiratory phasicity and response to augmentation. Calf Veins: Posterior tibial vein is normally patent. Left peroneal vein DVT visualized. Superficial Great Saphenous Vein: No evidence of thrombus. Normal compressibility. Venous Reflux:  None. Other Findings: No evidence of superficial thrombophlebitis or abnormal fluid collection. IMPRESSION: Positive for bilateral calf DVT in the peroneal veins and right gastrocnemius vein. Electronically Signed   By: Aletta Edouard M.D.   On: 12/08/2021 11:50   DG Chest Port 1 View  Result Date: 12/07/2021 CLINICAL DATA:  Questionable sepsis. Evaluate for abnormality. Hypoxemia. History of asthma/COPD. EXAM: PORTABLE CHEST 1 VIEW COMPARISON:  Radiographs 11/29/2021 and 11/28/2021.  CT 11/28/2021. FINDINGS: 1509 hours. The heart size and mediastinal contours appear stable. Compared with the most recent studies, there is partial clearing of the bibasilar airspace opacities, most consistent with improving pneumonia. No pneumothorax or significant pleural effusion identified. No acute osseous findings are seen. Telemetry leads overlie the chest. There is multilevel thoracic spondylosis. IMPRESSION: Interval partial clearing of bibasilar airspace opacities, most consistent with improving  pneumonia. Continued follow-up recommended to document complete clearing. No new findings identified. Electronically Signed   By: Richardean Sale M.D.   On: 12/07/2021 15:27   DG Chest 1 View  Result Date: 11/29/2021 CLINICAL DATA:  Shortness of breath EXAM: CHEST  1 VIEW COMPARISON:  Chest x-ray 11/28/2021. FINDINGS: Cardiomediastinal silhouette is within normal limits. There is increasing patchy airspace disease in the bilateral mid and lower lungs. There is no pleural effusion or pneumothorax. No acute fractures are seen. There is a healed left humeral neck fracture. IMPRESSION: Increasing patchy airspace disease in the bilateral mid and lower lungs, concerning for multifocal pneumonia. Electronically Signed   By: Ronney Asters M.D.   On: 11/29/2021 20:36   CT Angio Chest PE W and/or Wo Contrast  Result Date: 11/28/2021 CLINICAL DATA:  PE suspected.  Low O2 sats. EXAM: CT ANGIOGRAPHY CHEST WITH CONTRAST TECHNIQUE: Multidetector CT imaging of the chest was performed using the standard protocol during bolus administration of intravenous contrast. Multiplanar CT image reconstructions and MIPs were obtained to evaluate the vascular anatomy. RADIATION DOSE REDUCTION: This exam was performed according to the departmental dose-optimization program which includes automated exposure control, adjustment of the mA and/or kV according to patient size and/or use of iterative reconstruction technique. CONTRAST:  41m OMNIPAQUE IOHEXOL 350 MG/ML SOLN COMPARISON:  CT chest 09/20/2021 FINDINGS: Cardiovascular: Satisfactory opacification of the pulmonary arteries to the segmental level. No evidence of pulmonary embolism. Normal heart size. No pericardial effusion. Calcific aortic atherosclerosis. Multivessel coronary artery calcification. Mediastinum/Nodes: No enlarged mediastinal, hilar, or axillary lymph nodes. Thyroid gland and trachea demonstrate no significant findings. Patulous esophagus. Lungs/Pleura: A 1.0 x 0.8 cm  irregular nodule in the anterior right upper lobe appears subsolid on today's exam (series 6, image 57). Irregular consolidation with air bronchograms and surrounding ground-glass opacities dependently located in the left upper lobe is not significantly changed compared to 09/20/2021. Airspace consolidation with air bronchograms dependently located in the right upper and right middle lobes with adjacent ground-glass opacities are increased compared to 09/20/2021. Similarly, patchy consolidations with surrounding ground-glass opacities in the lower lungs are also  increased compared to 09/16/2021, particularly in the right lower lobe. No pleural effusion or pneumothorax. Upper Abdomen: No acute abnormality. Benign left hepatic cyst again noted. No follow-up imaging indicated. Musculoskeletal: No chest wall abnormality. No acute or suspicious osseous findings. Multilevel degenerative changes in the thoracic and visualized upper lumbar spine. Review of the MIP images confirms the above findings. IMPRESSION: 1. No pulmonary embolism. 2. Multifocal, dependently located, consolidations bilaterally, favored to represent multifocal pneumonia, possibly aspiration related. Recommend follow-up CT imaging in 3 months after treatment to ensure resolution. 3. The 1 cm nodule in the anterior right upper lobe appears subsolid on today's exam. As previously noted, bronchogenic carcinoma cannot be excluded. Recommend attention on follow-up imaging. 4. Multivessel coronary artery disease. Aortic Atherosclerosis (ICD10-I70.0). Electronically Signed   By: Ileana Roup M.D.   On: 11/28/2021 16:15   DG Chest Port 1 View  Result Date: 11/28/2021 CLINICAL DATA:  Dyspnea, shaking, hypoxia, history COPD, asthma, diabetes mellitus, hypertension EXAM: PORTABLE CHEST 1 VIEW COMPARISON:  Portable exam 1304 hours compared to 10/31/2021 FINDINGS: Upper normal size of cardiac silhouette. Mediastinal contours and pulmonary vascularity normal.  Atherosclerotic calcification aorta. Bibasilar infiltrates question multifocal pneumonia. Minimal pleural effusions. No pneumothorax. Bones demineralized with posttraumatic deformity proximal LEFT humerus and multilevel degenerative disc disease changes/scoliosis thoracic spine. IMPRESSION: Bibasilar pulmonary infiltrates question multifocal pneumonia. Minimal pleural effusions. Aortic Atherosclerosis (ICD10-I70.0). Electronically Signed   By: Lavonia Dana M.D.   On: 11/28/2021 13:15    Orson Eva, DO  [facility]  If 7PM-7AM, please contact night-coverage [email or web address] Password [email or web address] 12/18/2021, 5:27 PM   LOS: 11 days

## 2021-12-18 NOTE — Progress Notes (Signed)
Modified Barium Swallow Progress Note  Patient Details  Name: Michaela Moon MRN: [id number] Date of Birth: Mar 20, 1935  Today's Date: 12/18/2021  Modified Barium Swallow completed.  Full report located under Chart Review in the Imaging Section.  Brief recommendations include the following:  Clinical Impression  Pt presents with oropharyngeal swallowing to be essentially within functional limits. Note occasional pharyngeal penetration with thin liquids via cup and more frequent and deep/frank penetration with thin via straw, however all penetrates are during the swallow and are completely cleared by pharyngeal squeeze and pharyngeal stripping wave. Puree and regular textures are consumed without incident. Pt reports that she takes meds with puree at home; barium tablet was assessed with puree and note breif stasis of the tablet in the cervical esophagus visualized during esophageal sweep (no radiologist present to confirm) but an additional presentation of puree facilitated tablet passing through. Otherwise esophageal sweep was unremarkable. Recommend continue with a soft diet/D3 and thin liquids, however secondary to respiratory compromise and current deconditioning recommend avoid straws at this time. Recommend meds whole with puree and recommend universal aspiration precautions and esophageal precations. Above findings and recommendations reviewed with RN, Pt and [name]. There are no further ST needs noted at this time. ST will sign off   Swallow Evaluation Recommendations       SLP Diet Recommendations: Thin liquid;Dysphagia 3 (Mech soft) solids   Liquid Administration via: Cup;Straw   Medication Administration: Whole meds with puree   Supervision: Patient able to self feed;Intermittent supervision to cue for compensatory strategies   Compensations: Slow rate       Oral Care Recommendations: Oral care BID   Other Recommendations: Clarify dietary restrictions   Jezreel Sisk H.  Roddie Mc, CCC-SLP Speech Language Pathologist  [name] 12/18/2021,3:09 PM

## 2021-12-18 NOTE — Plan of Care (Signed)
  Problem: Education: Goal: Knowledge of General Education information will improve Description: Including pain rating scale, medication(s)/side effects and non-pharmacologic comfort measures Outcome: Progressing   Problem: Clinical Measurements: Goal: Ability to maintain clinical measurements within normal limits will improve Outcome: Progressing   

## 2021-12-19 DIAGNOSIS — J9601 Acute respiratory failure with hypoxia: Secondary | ICD-10-CM | POA: Diagnosis not present

## 2021-12-19 DIAGNOSIS — J441 Chronic obstructive pulmonary disease with (acute) exacerbation: Secondary | ICD-10-CM | POA: Diagnosis not present

## 2021-12-19 DIAGNOSIS — J9621 Acute and chronic respiratory failure with hypoxia: Secondary | ICD-10-CM | POA: Diagnosis not present

## 2021-12-19 DIAGNOSIS — I5031 Acute diastolic (congestive) heart failure: Secondary | ICD-10-CM | POA: Diagnosis not present

## 2021-12-19 LAB — CBC
HCT: 30.8 % — ABNORMAL LOW (ref 36.0–46.0)
Hemoglobin: 9.6 g/dL — ABNORMAL LOW (ref 12.0–15.0)
MCH: 26.4 pg (ref 26.0–34.0)
MCHC: 31.2 g/dL (ref 30.0–36.0)
MCV: 84.6 fL (ref 80.0–100.0)
Platelets: 166 10*3/uL (ref 150–400)
RBC: 3.64 MIL/uL — ABNORMAL LOW (ref 3.87–5.11)
RDW: 27.9 % — ABNORMAL HIGH (ref 11.5–15.5)
WBC: 16.3 10*3/uL — ABNORMAL HIGH (ref 4.0–10.5)
nRBC: 0 % (ref 0.0–0.2)

## 2021-12-19 LAB — GLUCOSE, CAPILLARY
Glucose-Capillary: 127 mg/dL — ABNORMAL HIGH (ref 70–99)
Glucose-Capillary: 117 mg/dL — ABNORMAL HIGH (ref 70–99)
Glucose-Capillary: 103 mg/dL — ABNORMAL HIGH (ref 70–99)
Glucose-Capillary: 42 mg/dL — CL (ref 70–99)
Glucose-Capillary: 47 mg/dL — ABNORMAL LOW (ref 70–99)
Glucose-Capillary: 112 mg/dL — ABNORMAL HIGH (ref 70–99)

## 2021-12-19 LAB — BASIC METABOLIC PANEL
Anion gap: 6 (ref 5–15)
BUN: 42 mg/dL — ABNORMAL HIGH (ref 8–23)
CO2: 29 mmol/L (ref 22–32)
Calcium: 10.3 mg/dL (ref 8.9–10.3)
Chloride: 101 mmol/L (ref 98–111)
Creatinine, Ser: 0.68 mg/dL (ref 0.44–1.00)
GFR, Estimated: >60 mL/min (ref >60)
Glucose, Bld: 126 mg/dL — ABNORMAL HIGH (ref 70–99)
Potassium: 5.1 mmol/L (ref 3.5–5.1)
Sodium: 136 mmol/L (ref 135–145)

## 2021-12-19 LAB — MAGNESIUM: Magnesium: 2.1 mg/dL (ref 1.7–2.4)

## 2021-12-19 NOTE — Progress Notes (Signed)
   Subjective:  Patient states she feels well overall and her shortness of breath has improved. No acute events overnight.  Objective:  Vital signs in last 24 hours: Vitals:   12/18/21 2317 12/19/21 0314 12/19/21 0522 12/19/21 0707  BP: (!) 112/40 (!) 145/61 137/87   Pulse: 65 75 71   Resp: 20 18    Temp: (!) 97.5 F (36.4 C) 98.3 F (36.8 C)    TempSrc: Oral Oral    SpO2: 94% 90% 90% 96%  Weight:      Height:       Weight change:   Intake/Output Summary (Last 24 hours) at 12/19/2021 1243 Last data filed at 12/18/2021 2300 Gross per 24 hour  Intake 240 ml  Output 1025 ml  Net -785 ml   Physical Examination   General: Lying in bed in no acute distress Head: Normocephalic, atraumatic Cardiovascular: regular rate and rhythm, without murmurs, rubs, or gallops, no LEE Respiratory: Normal work of breathing, mild rhonchi Abdominal: normoactive bowel sounds, no tenderness to palpation Skin: warm, dry Neurocognitive: AAO x 3 Psychiatric: Normal mood and affect.   Assessment/Plan:  Principal Problem:   Acute on chronic respiratory failure with hypoxia (HCC) Active Problems:   Essential hypertension   Mixed hyperlipidemia   Diabetes mellitus without complication (HCC)   Peripheral neuropathy   Acute exacerbation of chronic obstructive pulmonary disease (COPD) (HCC)   GERD (gastroesophageal reflux disease)   Dysphagia   Multifocal pneumonia   Lactic acidosis   Iron deficiency anemia   Hypoalbuminemia due to protein-calorie malnutrition (HCC)   Elevated brain natriuretic peptide (BNP) level   Acute diastolic CHF (congestive heart failure) (HCC)    Acute exacerbation of COPD Patient saturating > 90% on 6 L HFNC (baseline is 2 L [place]). Completed 5 days ceftriaxone/azithromycin on 10/17. -Continue duo nebs, Mucinex, Solu-Medrol, Singulair, Yupelri  -Continue Protonix to prevent steroid-induced ulcer -Continue incentive spirometry and flutter valve  Acute diastolic  CHF 70/17/79 TTE: LVEF 60-65% , no WMA, normal RVF. Patient diuresing on IV Lasix 40 mg/daily (- 1 L yesterday). Patient appears euvolemic on exam. -continue IV lasix -monitor I/O  Lobar pneumonia  Patient's vitals are stable. Leukocytosis at 16.3 (12.0 yesterday) Procalcitonin <0.10 Patient was started on ceftriaxone and azithromycin and 5 day course completed on 10/17.  Continue Tylenol as needed Continue Mucinex, incentive spirometry, flutter valve    Bilateral Calf DVT Confirmed by venous US 10/14 with PE ruled out by CTA. -Patient on Eliquis 5 mg BID -Patient using Bilateral TED hose  -Continue to monitor vitals  Lactic acidosis secondary to hypoxia -Lactic acid trended down from 2.8 to 2.3 on 10/13.    Dysphagia Last EGD on 04/2016 by Dr. Oneida Alar  showed no endoscopic esophageal abnormality to explain patient's dysphagia. Esophagus dilated per medical record Seen by speech yesterday and underwent barium swallow study - recommendation is for thin liquid; dysphagia 3 diet    T2DM with steroid induced hyperglycemia  12/08/21 A1C--6.3. CBG 117. Continue SSI and hypoglycemia protocol Continue Semglee 35 units bid plus novolog 18 units TID with meals, plus supplemental coverage and frequent CBG monitoring   Iron deficiency anemia Hgb is 9.6 (9.3 yesterday) Continue ferrous sulfate   Essential hypertension Continue losartan, Lopressor, add hydralazine    Mixed hyperlipidemia Continue Lipitor and ASA   GERD Continue Protonix   Peripheral neuropathy Continue Neurontin   LOS: 12 days   Stanford Breed, Medical Student 12/19/2021, 12:43 PM

## 2021-12-19 NOTE — Progress Notes (Signed)
Physical Therapy Treatment Patient Details Name: Michaela Moon MRN: [id number] DOB: Jun 04, 1935 Today's Date: 12/19/2021   History of Present Illness Michaela Moon is a 86 y.o. female with medical history significant of hypertension, T2DM, COPD, dysphagia requiring dilation who presents to the emergency department due to shortness of breath.  She was recently admitted from 10/4 to 10/10 due to acute on chronic hypoxemic respiratory failure secondary to multifocal pneumonia which was treated with IV Rocephin and azithromycin.  She completed a course of antibiotics while still in the hospital and O2 sat was weaned down to 4 to 5 L via [place].  It appeared that patient was on supplemental oxygen at 2 LPM on returning home after discharge.  Home oxygen was being monitored at home and when the home health nurse went to see patient yesterday, she was slightly hypoxic, home oxygen was monitored within the past 24 hours and was noted to have dropped down into the low 80s and the oxygen was increased to 5 L/min via [place], despite this, patient was still hypoxic with an O2 sat of 82% on arrival to the ED  Patient was also recently admitted to [facility] from 9/2 to 9/12 due to multifocal pneumonia and acute respiratory failure with hypoxia due to COVID-19 during which she was transferred to ICU and she required BiPAP, she was treated with Paxlovid for 5 days and she continued Decadron and empiric antibiotic.  She was discharged with supplemental oxygen via [place] at 2 LPM     At bedside, patient was requiring supplemental oxygen via HFNC at 15 L, she was not in any acute distress.  There was concern regarding food getting stuck in her throat/chest.    PT Comments    Patient presents in chair (assisted by nursing staff) and agreeable for therapy. Patient able to complete several bilateral LE exercises in chair. Patient's SpO2 at 91% on 7 LPM O2, decreased to 5 LPM. Patient able to ambulate in room with supervision/RW  with SpO2 averaging 91% with exertion and SpO2 increasing to 92% at rest following ambulation. Patient kept on 5 LPM and tolerated staying up in chair after therapy with son in room - LPN notified. Patient will benefit from continued skilled physical therapy in hospital and recommended venue below to increase strength, balance, endurance for safe ADLs and gait.    Recommendations for follow up therapy are one component of a multi-disciplinary discharge planning process, led by the attending physician.  Recommendations may be updated based on patient status, additional functional criteria and insurance authorization.  Follow Up Recommendations  Home health PT     Assistance Recommended at Discharge Set up Supervision/Assistance  Patient can return home with the following A little help with walking and/or transfers;A little help with bathing/dressing/bathroom;Help with stairs or ramp for entrance;Assistance with cooking/housework   Equipment Recommendations  Rolling walker (2 wheels)    Recommendations for Other Services       Precautions / Restrictions Precautions Precautions: Fall Restrictions Weight Bearing Restrictions: No     Mobility  Bed Mobility                    Transfers Overall transfer level: Modified independent Equipment used: Rolling walker (2 wheels) Transfers: Sit to/from Stand Sit to Stand: Supervision           General transfer comment: patient demonstrating greater ease with STS with supervision/RW    Ambulation/Gait Ambulation/Gait assistance: Supervision Gait Distance (Feet): 20 Feet Assistive device: Rolling  walker (2 wheels) Gait Pattern/deviations: Decreased step length - right, Decreased step length - left, Decreased stride length Gait velocity: decreased     General Gait Details: patient SpO2 at 91% on 7 LPM O2, decreased to 5 LPM with SpO2 averaging 91% during ambulation in room w/ RW, patient's O2 at 92% at rest following  ambulation, patient kept on 5 LPM   Stairs             Wheelchair Mobility    Modified Rankin (Stroke Patients Only)       Balance Overall balance assessment: Needs assistance Sitting-balance support: Feet supported, No upper extremity supported Sitting balance-Leahy Scale: Good Sitting balance - Comments: seated EOB   Standing balance support: During functional activity, Bilateral upper extremity supported Standing balance-Leahy Scale: Good Standing balance comment: good with RW                            Cognition Arousal/Alertness: Awake/alert Behavior During Therapy: WFL for tasks assessed/performed Overall Cognitive Status: Within Functional Limits for tasks assessed                                          Exercises General Exercises - Lower Extremity Long Arc Quad: AROM, Seated, Strengthening, Both, 20 reps Hip Flexion/Marching: 20 reps, Both, AROM, Strengthening, Seated Toe Raises: Seated, AROM, Strengthening, Both, 20 reps Heel Raises: Seated, AROM, Strengthening, Both, 20 reps    General Comments        Pertinent Vitals/Pain Pain Assessment Pain Assessment: No/denies pain    Home Living                          Prior Function            PT Goals (current goals can now be found in the care plan section) Acute Rehab PT Goals Patient Stated Goal: return home with family to assist PT Goal Formulation: With patient Time For Goal Achievement: 12/26/21 Potential to Achieve Goals: Good Progress towards PT goals: Progressing toward goals    Frequency    Min 3X/week      PT Plan Current plan remains appropriate    Co-evaluation              AM-PAC PT "6 Clicks" Mobility   Outcome Measure  Help needed turning from your back to your side while in a flat bed without using bedrails?: None Help needed moving from lying on your back to sitting on the side of a flat bed without using bedrails?:  None Help needed moving to and from a bed to a chair (including a wheelchair)?: A Little Help needed standing up from a chair using your arms (e.g., wheelchair or bedside chair)?: A Little Help needed to walk in hospital room?: A Little Help needed climbing 3-5 steps with a railing? : A Lot 6 Click Score: 19    End of Session Equipment Utilized During Treatment: Oxygen Activity Tolerance: Patient tolerated treatment well;Patient limited by fatigue Patient left: in chair;with call bell/phone within reach;with family/visitor present Nurse Communication: Mobility status PT Visit Diagnosis: Unsteadiness on feet (R26.81);Other abnormalities of gait and mobility (R26.89);Muscle weakness (generalized) (M62.81)     Time: 5277-8242 PT Time Calculation (min) (ACUTE ONLY): 22 min  Charges:  $Therapeutic Exercise: 8-22 mins $Therapeutic Activity: 8-22 mins  Zigmund Gottron, SPT

## 2021-12-19 NOTE — Progress Notes (Signed)
Pt slept fairly well throughout the night. Remains with crackles to bilat lung fields, no distress noted. O2 sats fluctuating between 86%-92% with O2 titrated between 6-7 liters HFNC. Pt on continuous pule ox for closer monitoring. Goal is to keep Sats >87%. One assist with transfer to [facility], pt able to tolerate well. Denies pain, no prn medications given.

## 2021-12-20 ENCOUNTER — Telehealth: Payer: Self-pay | Admitting: Pulmonary Disease

## 2021-12-20 LAB — GLUCOSE, CAPILLARY
Glucose-Capillary: 130 mg/dL — ABNORMAL HIGH (ref 70–99)
Glucose-Capillary: 136 mg/dL — ABNORMAL HIGH (ref 70–99)
Glucose-Capillary: 104 mg/dL — ABNORMAL HIGH (ref 70–99)
Glucose-Capillary: 111 mg/dL — ABNORMAL HIGH (ref 70–99)
Glucose-Capillary: 318 mg/dL — ABNORMAL HIGH (ref 70–99)

## 2021-12-20 MED ORDER — POLYETHYLENE GLYCOL 3350 17 G PO PACK: 17.0000 g | PACK | Freq: Every day | ORAL | 2 refills | Status: AC | PRN

## 2021-12-20 MED ORDER — APIXABAN 5 MG PO TABS: 5.0000 mg | ORAL_TABLET | Freq: Two times a day (BID) | ORAL | 1 refills | Status: DC

## 2021-12-20 MED ORDER — HYDRALAZINE HCL 25 MG PO TABS: 25.0000 mg | ORAL_TABLET | Freq: Three times a day (TID) | ORAL | Status: DC

## 2021-12-20 MED ORDER — GABAPENTIN 100 MG PO CAPS: 100.0000 mg | ORAL_CAPSULE | Freq: Three times a day (TID) | ORAL | 1 refills | Status: AC

## 2021-12-20 MED ORDER — PREDNISONE 20 MG PO TABS: ORAL_TABLET | ORAL | 0 refills | Status: DC

## 2021-12-20 MED ORDER — INSULIN ASPART 100 UNIT/ML IJ SOLN
10.0000 [IU] | Freq: Three times a day (TID) | INTRAMUSCULAR | Status: DC
  Administered 2021-12-20: 10 [IU] via SUBCUTANEOUS

## 2021-12-20 MED ORDER — INSULIN GLARGINE-YFGN 100 UNIT/ML ~~LOC~~ SOLN
35.0000 [IU] | Freq: Every day | SUBCUTANEOUS | Status: DC
  Filled 2021-12-20: qty 0.35

## 2021-12-20 NOTE — Progress Notes (Signed)
Patient requires frequent re-positioning of the body in ways that cannot be achieved with an ordinary bed or wedge pillow to eliminate pain and the head of the bed needs to be evaluated more than 30 degrees most of the time

## 2021-12-20 NOTE — Discharge Summary (Signed)
Physician Discharge Summary  RHEANNA SERGENT XNA:[id number] DOB: 1935-08-14 DOA: 12/07/2021  PCP: [name], MD Pulmonologist: Dr. Halford Chessman  Admit date: 12/07/2021 Discharge date: 12/20/2021  Admitted From:  HOME  Disposition: HOME   Recommendations for Outpatient Follow-up:  Follow up with PCP in 1 weeks Follow up with pulmonologist in Nov 2023 as scheduled  Frequent CBG monitoring 4-5 times per day recommended Bleeding precautions while on anticoagulant therapy (apixaban)  Home Health:  PT  Discharge Condition: STABLE   CODE STATUS: DNR DIET: dysphagia 3 recommended by SLP    Brief Hospitalization Summary: Please see all hospital notes, images, labs for full details of the hospitalization. Brief History:  86 y.o. female with medical history significant of hypertension, T2DM, COPD, dysphagia requiring dilation who presents to the emergency department due to shortness of breath.  She was recently admitted from 10/4 to 10/10 due to acute on chronic hypoxemic respiratory failure secondary to multifocal pneumonia which was treated with IV Rocephin and azithromycin.  She completed a course of antibiotics while still in the hospital and O2 sat was weaned down to 4 to 5 L via [place].  It appeared that patient was on supplemental oxygen at 2 LPM on returning home after discharge.  Home oxygen was being monitored at home and when the home health nurse went to see patient yesterday, she was slightly hypoxic, home oxygen was monitored within the past 24 hours and was noted to have dropped down into the low 80s and the oxygen was increased to 5 L/min via [place], despite this, patient was still hypoxic with an O2 sat of 82% on arrival to the ED.  Patient was also recently admitted to [facility] from 9/2 to 9/12 due to multifocal pneumonia and acute respiratory failure with hypoxia due to COVID-19 during which she was transferred to ICU and she required BiPAP, she was treated with Paxlovid for 5 days and  she continued Decadron and empiric antibiotic.  She was discharged with supplemental oxygen via [place] at 2 LPM.   At bedside, patient was requiring supplemental oxygen via HFNC at 15 L, she was not in any acute distress.  There was concern regarding food getting stuck in her throat/chest.   ED Course:  In the emergency department, she was intermittently tachypneic, BP was 131/57, temperature 97.51F, pulse 100 bpm, O2 sat 95% on NRB.  Work-up in the ED showed WBC 15.3, hemoglobin 8.8, hematocrit 32.8, MCV 82.2, platelets 208.  BMP showed sodium 139, potassium 3.7, chloride 107, bicarb 24, glucose 88, BUN 11, creatinine 0.77.  Lactic acid 2.8 > 2.3, BNP 116 (this was 329 about 9 days ago).  Influenza A, B, SARS coronavirus 2 was negative.  Blood culture was pending.  Chest x-ray showed Interval partial clearing of bibasilar airspace opacities, most consistent with improving pneumonia. Solu-Medrol 25 mg x 1 was given, she was started on IV ceftriaxone and azithromycin, IV hydration was provided.  Hospitalist was asked to admit patient for further evaluation and management.  She finished 5 days of ceftriaxone and azithromycin.  She continued to have high oxygenation requirements up to 30L HFNC at 50%.  PCCM was consulted to assist.  The patient was continued on IV solumedrol. There was a component of fluid overload.  The patient was started on IV lasix.  She began to slowly improve with decreasing oxygenation demand.     HOSPITAL COURSE BY PROBLEM    Acute on chronic respiratory failure with hypoxia O2 sat dropped to 80s  on supplemental oxygen via [place] at 5 LPM and required up to [facility] at 30 L/min at 100% to maintain sats, now weaned down to baseline of 3-5L/min.  Goal pulse ox is >87% no need for pulse ox to be 100%  Reports on 3-4L [place] oxygen at home  Weaning from 30L at 50% FiO2>>8L>>6L Pulm signed off.  OUTPATIENT FOLLOW UP SCHEDULED WITH PULMONARY CLINIC   Acute exacerbation of COPD 10/14 CTA  chest--negative for PE but reports concern for atypical/noninfectious pneumonia--interstitial & alveolar opacities Appreciate pulmonology recommendations.  Continue duo nebs, Mucinex, Solu-Medrol, Singulair  Finished 5 days ceftriaxone/azithromycin. Continue Protonix to prevent steroid-induced ulcer Continue incentive spirometry and flutter valve 12/08/21 TTE: LVEF 60-65% , no WMA, G1 DD, normal RVF 10/19--weaning steroids Long outpatient home prednisone taper ordered at discharge (21 days taper)    Acute diastolic CHF - TREATED AND RESOLVED -treated with IV lasix with good results, resume home diuretic at discharge -personally reviewed CXR--increased interstitial markings -accurate I/O--incomplete   Lobar pneumonia - TREATED Chest CTA--no PE; concern for noninfectious/atypical pneumonia  Consulted pulmonology on 10/16.  Procalcitonin <0.10 Patient was started on ceftriaxone and azithromycin--finished 5 d Continue Tylenol as needed Continue Mucinex, incentive spirometry, flutter valve    Bilateral Calf DVT Confirmed by venous US 10/14 TED  [facility] Apixaban ordered  PE was ruled out by CTA  -Hgb stable   Lactic acidosis  -from hypoxia Lactic acid 2.8 > 2.3 trended down   Dysphagia Last EGD on 04/2016 by Dr. Oneida Alar  showed no endoscopic esophageal abnormality to explain patient's dysphagia. Esophagus dilated per medical record Seen by speech--dys 3 with thin   T2DM with steroid induced hyperglycemia  12/08/21 A1C--6.3 Resume home treatment with frequent CBG monitoring and close follow up with PCP  Hypoglycemia from insulin - reduced insulin doses and corrected hypoglycemia - resuming home treatment regimen - recommending frequent CBG monitoring at least 4 - 5 times per day   Iron deficiency anemia Continue ferrous sulfate   Essential hypertension Continue losartan, Lopressor, add hydralazine    Mixed hyperlipidemia Continue Lipitor   GERD Continue Protonix    Peripheral neuropathy Continue Neurontin    Family Communication:  son at bedside 10/24   Consultants:  pulm   Code Status:  DNR   DVT Prophylaxis:  apixaban   Discharge Diagnoses:  Principal Problem:   Acute respiratory failure with hypoxia ([place]) Active Problems:   Essential hypertension   Mixed hyperlipidemia   Diabetes mellitus without complication (HCC)   Peripheral neuropathy   Acute exacerbation of chronic obstructive pulmonary disease (COPD) (HCC)   GERD (gastroesophageal reflux disease)   Dysphagia   Multifocal pneumonia   Lactic acidosis   Iron deficiency anemia   Hypoalbuminemia due to protein-calorie malnutrition (HCC)   Elevated brain natriuretic peptide (BNP) level   Acute diastolic CHF (congestive heart failure) ([place])   Discharge Instructions: Discharge Instructions     Ambulatory referral to Pulmonology   Complete by: As directed    Hospital follow up   Reason for referral: Asthma/COPD      Allergies as of 12/20/2021       Reactions   Onion    wheezing        Medication List     STOP taking these medications    Azelastine HCl 0.15 % Soln Commonly known as: Astepro       TAKE these medications    albuterol (2.5 MG/3ML) 0.083% nebulizer solution Commonly known as: PROVENTIL Take 3 mLs (2.5 mg  total) by nebulization every 6 (six) hours as needed for wheezing or shortness of breath.   albuterol-ipratropium 18-103 MCG/ACT inhaler Commonly known as: COMBIVENT Inhale 2 puffs into the lungs every 6 (six) hours as needed for wheezing. What changed: Another medication with the same name was removed. Continue taking this medication, and follow the directions you see here.   apixaban 5 MG Tabs tablet Commonly known as: ELIQUIS Take 1 tablet (5 mg total) by mouth 2 (two) times daily.   ARTIFICIAL TEARS OP Place 1 drop into both eyes 4 (four) times daily.   aspirin 81 MG tablet Take 81 mg by mouth daily.   atorvastatin 80 MG  tablet Commonly known as: LIPITOR ALTERNATE TAKING 1 TABLET EVERY OTHER DAY WITH 1/2 TABLET EVERY OTHERDAY   BIOTIN PO Take by mouth.   Breztri Aerosphere 160-9-4.8 MCG/ACT Aero Generic drug: Budeson-Glycopyrrol-Formoterol Inhale 2 puffs into the lungs in the morning and at bedtime.   CENTRUM SILVER PO Take 1 tablet by mouth daily.   docusate sodium 100 MG capsule Commonly known as: Colace Take 1 capsule (100 mg total) by mouth daily.   ferrous sulfate 325 (65 FE) MG tablet Take 1 tablet (325 mg total) by mouth daily.   Fish Oil 1000 MG Caps Take 1,000 mg by mouth in the morning, at noon, and at bedtime.   fluticasone 50 MCG/ACT nasal spray Commonly known as: FLONASE Place 1 spray into both nostrils daily.   gabapentin 100 MG capsule Commonly known as: NEURONTIN Take 1 capsule (100 mg total) by mouth 3 (three) times daily. What changed:  medication strength how much to take   glimepiride 2 MG tablet Commonly known as: AMARYL Take 4 mg by mouth daily with breakfast.   guaiFENesin 600 MG 12 hr tablet Commonly known as: Mucinex Take 2 tablets (1,200 mg total) by mouth 2 (two) times daily as needed for cough or to loosen phlegm.   HAIR/SKIN/NAILS PO Take 1 tablet by mouth daily. Takes once a day.   ipratropium 0.03 % nasal spray Commonly known as: ATROVENT Place 2 sprays into both nostrils every 12 (twelve) hours.   Lantus SoloStar 100 UNIT/ML Solostar Pen Generic drug: insulin glargine Inject 20 Units into the skin in the morning and at bedtime.   losartan-hydrochlorothiazide 100-12.5 MG tablet Commonly known as: HYZAAR TAKE ONE (1) TABLET EACH DAY What changed: See the new instructions.   metoprolol tartrate 50 MG tablet Commonly known as: LOPRESSOR Take 50 mg by mouth 2 (two) times daily.   montelukast 10 MG tablet Commonly known as: SINGULAIR TAKE ONE TABLET BY MOUTH AT BEDTIME   oxybutynin 5 MG tablet Commonly known as: DITROPAN Take 5 mg by mouth  every 8 (eight) hours as needed for bladder spasms.   pantoprazole 40 MG tablet Commonly known as: PROTONIX Take 40 mg by mouth daily.   polyethylene glycol 17 g packet Commonly known as: MIRALAX / GLYCOLAX Take 17 g by mouth daily as needed for mild constipation.   predniSONE 20 MG tablet Commonly known as: DELTASONE Take 3 PO QAM x7days, 2 PO QAM x7days, 1 PO QAM x7days Start taking on: December 21, 2021   sitaGLIPtin-metformin 50-1000 MG tablet Commonly known as: JANUMET Take 1 tablet by mouth 2 (two) times daily with a meal.   triamcinolone cream 0.1 % Commonly known as: KENALOG Apply 1 application topically daily as needed (eczema).               Durable Medical Equipment  (  From admission, onward)           Start     Ordered   12/20/21 1301  For home use only DME Hospital bed  Once       Question Answer Comment  Length of Need Lifetime   The above medical condition requires: Patient requires the ability to reposition frequently   Bed type Semi-electric      12/20/21 1300   12/20/21 1243  For home use only DME Walker rolling  Once       Question Answer Comment  Walker: With [place]   Patient needs a walker to treat with the following condition Gait instability      12/20/21 1243            Follow-up Information     Hasanaj, Samul Dada, MD. Schedule an appointment as soon as possible for a visit in 1 week(s).   Specialty: Internal Medicine Why: Hospital Follow Up Contact information: [address] [place] [address] 073 [phone]                Allergies  Allergen Reactions   Onion     wheezing   Allergies as of 12/20/2021       Reactions   Onion    wheezing        Medication List     STOP taking these medications    Azelastine HCl 0.15 % Soln Commonly known as: Astepro       TAKE these medications    albuterol (2.5 MG/3ML) 0.083% nebulizer solution Commonly known as: PROVENTIL Take 3 mLs (2.5 mg total)  by nebulization every 6 (six) hours as needed for wheezing or shortness of breath.   albuterol-ipratropium 18-103 MCG/ACT inhaler Commonly known as: COMBIVENT Inhale 2 puffs into the lungs every 6 (six) hours as needed for wheezing. What changed: Another medication with the same name was removed. Continue taking this medication, and follow the directions you see here.   apixaban 5 MG Tabs tablet Commonly known as: ELIQUIS Take 1 tablet (5 mg total) by mouth 2 (two) times daily.   ARTIFICIAL TEARS OP Place 1 drop into both eyes 4 (four) times daily.   aspirin 81 MG tablet Take 81 mg by mouth daily.   atorvastatin 80 MG tablet Commonly known as: LIPITOR ALTERNATE TAKING 1 TABLET EVERY OTHER DAY WITH 1/2 TABLET EVERY OTHERDAY   BIOTIN PO Take by mouth.   Breztri Aerosphere 160-9-4.8 MCG/ACT Aero Generic drug: Budeson-Glycopyrrol-Formoterol Inhale 2 puffs into the lungs in the morning and at bedtime.   CENTRUM SILVER PO Take 1 tablet by mouth daily.   docusate sodium 100 MG capsule Commonly known as: Colace Take 1 capsule (100 mg total) by mouth daily.   ferrous sulfate 325 (65 FE) MG tablet Take 1 tablet (325 mg total) by mouth daily.   Fish Oil 1000 MG Caps Take 1,000 mg by mouth in the morning, at noon, and at bedtime.   fluticasone 50 MCG/ACT nasal spray Commonly known as: FLONASE Place 1 spray into both nostrils daily.   gabapentin 100 MG capsule Commonly known as: NEURONTIN Take 1 capsule (100 mg total) by mouth 3 (three) times daily. What changed:  medication strength how much to take   glimepiride 2 MG tablet Commonly known as: AMARYL Take 4 mg by mouth daily with breakfast.   guaiFENesin 600 MG 12 hr tablet Commonly known as: Mucinex Take 2 tablets (1,200 mg total) by mouth 2 (two)  times daily as needed for cough or to loosen phlegm.   HAIR/SKIN/NAILS PO Take 1 tablet by mouth daily. Takes once a day.   ipratropium 0.03 % nasal spray Commonly known  as: ATROVENT Place 2 sprays into both nostrils every 12 (twelve) hours.   Lantus SoloStar 100 UNIT/ML Solostar Pen Generic drug: insulin glargine Inject 20 Units into the skin in the morning and at bedtime.   losartan-hydrochlorothiazide 100-12.5 MG tablet Commonly known as: HYZAAR TAKE ONE (1) TABLET EACH DAY What changed: See the new instructions.   metoprolol tartrate 50 MG tablet Commonly known as: LOPRESSOR Take 50 mg by mouth 2 (two) times daily.   montelukast 10 MG tablet Commonly known as: SINGULAIR TAKE ONE TABLET BY MOUTH AT BEDTIME   oxybutynin 5 MG tablet Commonly known as: DITROPAN Take 5 mg by mouth every 8 (eight) hours as needed for bladder spasms.   pantoprazole 40 MG tablet Commonly known as: PROTONIX Take 40 mg by mouth daily.   polyethylene glycol 17 g packet Commonly known as: MIRALAX / GLYCOLAX Take 17 g by mouth daily as needed for mild constipation.   predniSONE 20 MG tablet Commonly known as: DELTASONE Take 3 PO QAM x7days, 2 PO QAM x7days, 1 PO QAM x7days Start taking on: December 21, 2021   sitaGLIPtin-metformin 50-1000 MG tablet Commonly known as: JANUMET Take 1 tablet by mouth 2 (two) times daily with a meal.   triamcinolone cream 0.1 % Commonly known as: KENALOG Apply 1 application topically daily as needed (eczema).               Durable Medical Equipment  (From admission, onward)           Start     Ordered   12/20/21 1301  For home use only DME Hospital bed  Once       Question Answer Comment  Length of Need Lifetime   The above medical condition requires: Patient requires the ability to reposition frequently   Bed type Semi-electric      12/20/21 1300   12/20/21 1243  For home use only DME Walker rolling  Once       Question Answer Comment  Walker: With [place] Whe[employer]   Patient needs a walker to treat with the following condition Gait instability      12/20/21 1243            Procedures/Studies: DG  Swallowing Func-Speech Pathology  Result Date: 12/18/2021 Table formatting from the original result was not included. Objective Swallowing Evaluation: Type of Study: MBS-Modified Barium Swallow Study  Patient Details Name: SOLENNE MANWARREN MRN: [id number] Date of Birth: [birth year] Today's Date: 12/18/2021 Time: SLP Start Time (ACUTE ONLY): 64 -SLP Stop Time (ACUTE ONLY): 4782 SLP Time Calculation (min) (ACUTE ONLY): 29 min Past Medical History: Past Medical History: Diagnosis Date  Arthritis   Asthma   COPD (chronic obstructive pulmonary disease) ([place])   Diabetes mellitus without complication (HCC)   GERD (gastroesophageal reflux disease)   Headache   Hyperlipidemia   Hypertension   Macular degeneration   Neuropathy   Obesity   Osteopenia   Stress incontinence  Past Surgical History: Past Surgical History: Procedure Laterality Date  BREAST LUMPECTOMY Bilateral   no cancer  CARPAL TUNNEL RELEASE Left 10/23/2017  Procedure: LEFT CARPAL TUNNEL RELEASE;  Surgeon: Carole Civil, MD;  Location: [facility];  Service: Orthopedics;  Laterality: Left;  CARPAL TUNNEL RELEASE Right 11/20/2017  Procedure: CARPAL TUNNEL RELEASE;  Surgeon: Aline Brochure,  Tim Lair, MD;  Location: [facility];  Service: Orthopedics;  Laterality: Right;  CATARACT EXTRACTION W/PHACO Left 11/01/2016  Procedure: CATARACT EXTRACTION PHACO AND INTRAOCULAR LENS PLACEMENT (IOC);  Surgeon: Baruch Goldmann, MD;  Location: [facility];  Service: Ophthalmology;  Laterality: Left;  CDE: 3.83  CATARACT EXTRACTION W/PHACO Right 11/29/2016  Procedure: CATARACT EXTRACTION PHACO AND INTRAOCULAR LENS PLACEMENT RIGHT EYE;  Surgeon: Baruch Goldmann, MD;  Location: [facility];  Service: Ophthalmology;  Laterality: Right;  CDE: 8.71  colon tumor removed  2010  Dr. 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 submucosal lipoma on path  COLONOSCOPY  05/2013  Dr. Anthony Sar: normal. (h/o colon polyps)  COLONOSCOPY  2011  normal  COLONOSCOPY  10/2003  Dr. Lindalou Hose:  large polypoid mass hepatic fluexure  ESOPHAGOGASTRODUODENOSCOPY N/A 04/29/2016  Procedure: ESOPHAGOGASTRODUODENOSCOPY (EGD);  Surgeon: Danie Binder, MD;  Location: [facility];  Service: Endoscopy;  Laterality: N/A;  9:15am  SAVORY DILATION N/A 04/29/2016  Procedure: SAVORY DILATION;  Surgeon: Danie Binder, MD;  Location: [facility];  Service: Endoscopy;  Laterality: N/A; HPI: 86 y.o. female with medical history significant of hypertension, T2DM, COPD, dysphagia requiring dilation who presents to the emergency department due to shortness of breath.  She was recently admitted from 10/4 to 10/10 due to acute on chronic hypoxemic respiratory failure secondary to multifocal pneumonia which was treated with IV Rocephin and azithromycin. Prior to this, she was admitted at [facility]- admitted 9/2 to 9/12 for acute respiratory failure, required BiPAP.  She was positive for COVID and treated with antibiotics and Paxlovid for multifocal pneumonia. [name] reports to me she has had PNA ~5-7 times in the past 2 years. CXR reveals: "Interval partial clearing of bibasilar airspace opacities, most  consistent with improving pneumonia. Continued follow-up recommended  to document complete clearing. No new findings identified." BSE requested.  Subjective: "I sometimes have trouble swallowing solid foods."  Recommendations for follow up therapy are one component of a multi-disciplinary discharge planning process, led by the attending physician.  Recommendations may be updated based on patient status, additional functional criteria and insurance authorization. Assessment / Plan / Recommendation   12/18/2021   2:00 PM Clinical Impressions Clinical Impression  Pt presents with oropharyngeal swallowing to be essentially within functional limits. Note occasional pharyngeal penetration with thin liquids via cup and more frequent and deep/frank penetration with thin via straw, however all penetrates are during the swallow and  are completely cleared by pharyngeal squeeze and pharyngeal stripping wave. Puree and regular textures are consumed without incident. Pt reports that she takes meds with puree at home; barium tablet was assessed with puree and note breif stasis of the tablet in the cervical esophagus visualized during esophageal sweep (no radiologist present to confirm) but an additional presentation of puree facilitated tablet passing through. Otherwise esophageal sweep was unremarkable. Recommend continue with a soft diet/D3 and thin liquids, however secondary to respiratory compromise and current deconditioning recommend avoid straws at this time. Recommend meds whole with puree and recommend universal aspiration precautions and esophageal precations. Above findings and recommendations reviewed with RN, Pt and [name]. There are no further ST needs noted at this time. ST will sign off SLP Visit Diagnosis Dysphagia, unspecified (R13.10) Impact on safety and function Mild aspiration risk     12/18/2021   2:00 PM Treatment Recommendations Treatment Recommendations Therapy as outlined in treatment plan below     12/18/2021   2:00 PM Prognosis Prognosis for Safe Diet  Advancement Good   12/18/2021   2:00 PM Diet Recommendations SLP Diet Recommendations Thin liquid;Dysphagia 3 (Mech soft) solids Liquid Administration via Cup;Straw Medication Administration Whole meds with puree Compensations Slow rate     12/18/2021   2:00 PM Other Recommendations Oral Care Recommendations Oral care BID Other Recommendations Clarify dietary restrictions Follow Up Recommendations Follow physician's recommendations for discharge plan and follow up therapies   12/09/2021   3:00 PM Frequency and Duration  Speech Therapy Frequency (ACUTE ONLY) min 2x/week Treatment Duration 1 week     12/18/2021   2:00 PM Oral Phase Oral Phase [facility]    12/18/2021   2:00 PM Pharyngeal Phase Pharyngeal Phase Impaired Pharyngeal- Thin Teaspoon WFL Pharyngeal- Thin Cup  Penetration/Aspiration during swallow Pharyngeal- Thin Straw Penetration/Aspiration during swallow    12/18/2021   2:00 PM Cervical Esophageal Phase  Cervical Esophageal Phase WFL Amelia H. Roddie Mc, CCC-SLP Speech Language Pathologist [name] 12/18/2021, 3:11 PM                     DG Chest 1 View  Result Date: 12/13/2021 CLINICAL DATA:  Shortness of breath EXAM: CHEST  1 VIEW COMPARISON:  12/11/2021 FINDINGS: Mild cardiomegaly and vascular congestion. Mild interstitial prominence and lower lobe airspace opacities. Findings similar to prior study. No visible effusions. No acute bony abnormality. Chronic healed proximal left humeral fracture. IMPRESSION: Cardiomegaly with vascular congestion and probable mild interstitial edema. Lower lobe airspace opacities are unchanged. Electronically Signed   By: Rolm Baptise M.D.   On: 12/13/2021 01:04   DG Chest Port 1 View  Result Date: 12/11/2021 CLINICAL DATA:  5626 with acute respiratory failure. EXAM: PORTABLE CHEST 1 VIEW COMPARISON:  12/08/2021 portable chest and chest CT. FINDINGS: 4:49 a.m. There is cardiomegaly again noted and mild central vascular prominence, with aortic atherosclerosis. Stable mediastinum. Interstitial and patchy alveolar opacities in the mid to lower lung fields continue to be seen, with a basal gradient. There are minimal pleural effusions. There is improvement in opacities in the base of both lungs but significant opacity remains and is otherwise unaltered. The upper 1/3 of the lungs remain generally clear. There is osteopenia and degenerative change thoracic spine, chronic healed fracture deformity proximal left humerus. IMPRESSION: Improved aeration at the base of both lungs. No other noteworthy change in the bilateral lung opacities and underlying interstitial prominence. Cardiomegaly. Electronically Signed   By: Telford Nab M.D.   On: 12/11/2021 06:29   DG CHEST PORT 1 VIEW  Result Date: 12/08/2021 CLINICAL  DATA:  Respiratory failure EXAM: PORTABLE CHEST 1 VIEW COMPARISON:  12/07/2021 FINDINGS: No significant change in [facility] portable chest radiograph. Cardiomegaly with bilateral interstitial and heterogeneous airspace opacity of the lung bases, and probable small layering pleural effusions. Partially imaged chronic fracture deformity of the proximal left humerus. IMPRESSION: No significant change in AP portable chest radiograph. Cardiomegaly with bilateral interstitial and heterogeneous airspace opacity of the lung bases, and probable small layering pleural effusions. Electronically Signed   By: Delanna Ahmadi M.D.   On: 12/08/2021 16:16   ECHOCARDIOGRAM COMPLETE  Result Date: 12/08/2021    ECHOCARDIOGRAM REPORT   Patient Name:   TIMARIE LABELL Date of Exam: 12/08/2021 Medical Rec #:  [id number]       Height:       65.0 in Accession #:    [id number]      Weight:       178.8 lb Date of Birth:  Dec 12, 1935  BSA:          1.886 m Patient Age:    86 years        BP:           160/42 mmHg Patient Gender: F               HR:           68 bpm. Exam Location:  [facility] Procedure: 2D Echo, Cardiac Doppler and Color Doppler Indications:    CHF  History:        Patient has no prior history of Echocardiogram examinations.                 COPD; Risk Factors:Hypertension, Diabetes and Dyslipidemia.  Sonographer:    Wenda Low Referring Phys: [id number] OLADAPO ADEFESO IMPRESSIONS  1. Left ventricular ejection fraction, by estimation, is 60 to 65%. The left ventricle has normal function. The left ventricle has no regional wall motion abnormalities. The left ventricular internal cavity size was mildly dilated. There is mild left ventricular hypertrophy of the septal segment. Left ventricular diastolic parameters are consistent with Grade I diastolic dysfunction (impaired relaxation).  2. Right ventricular systolic function is normal. The right ventricular size is normal. There is normal pulmonary artery systolic pressure.   3. The mitral valve is grossly normal. No evidence of mitral valve regurgitation.  4. The aortic valve is tricuspid. Aortic valve regurgitation is not visualized. No aortic stenosis is present.  5. The inferior vena cava is normal in size with greater than 50% respiratory variability, suggesting right atrial pressure of 3 mmHg. Comparison(s): No prior Echocardiogram. FINDINGS  Left Ventricle: Left ventricular ejection fraction, by estimation, is 60 to 65%. The left ventricle has normal function. The left ventricle has no regional wall motion abnormalities. The left ventricular internal cavity size was mildly dilated. There is  mild left ventricular hypertrophy of the septal segment. Left ventricular diastolic parameters are consistent with Grade I diastolic dysfunction (impaired relaxation). Right Ventricle: The right ventricular size is normal. No increase in right ventricular wall thickness. Right ventricular systolic function is normal. There is normal pulmonary artery systolic pressure. The tricuspid regurgitant velocity is 2.06 m/s, and  with an assumed right atrial pressure of 3 mmHg, the estimated right ventricular systolic pressure is 60.7 mmHg. Left Atrium: Left atrial size was normal in size. Right Atrium: Right atrial size was normal in size. Pericardium: There is no evidence of pericardial effusion. Mitral Valve: The mitral valve is grossly normal. Mild mitral annular calcification. No evidence of mitral valve regurgitation. MV peak gradient, 4.9 mmHg. The mean mitral valve gradient is 2.0 mmHg. Tricuspid Valve: The tricuspid valve is normal in structure. Tricuspid valve regurgitation is not demonstrated. No evidence of tricuspid stenosis. Aortic Valve: The aortic valve is tricuspid. There is mild aortic valve annular calcification. Aortic valve regurgitation is not visualized. No aortic stenosis is present. Aortic valve mean gradient measures 6.0 mmHg. Aortic valve peak gradient measures 12.1 mmHg.  Aortic valve area, by VTI measures 2.03 cm. Pulmonic Valve: The pulmonic valve was normal in structure. Pulmonic valve regurgitation is not visualized. No evidence of pulmonic stenosis. Aorta: The aortic root is normal in size and structure. Venous: The inferior vena cava is normal in size with greater than 50% respiratory variability, suggesting right atrial pressure of 3 mmHg. IAS/Shunts: No atrial level shunt detected by color flow Doppler.  LEFT VENTRICLE PLAX 2D LVIDd:         5.30 cm  Diastology LVIDs:         3.20 cm   LV e' medial:    10.00 cm/s LV PW:         1.20 cm   LV E/e' medial:  7.4 LV IVS:        1.00 cm   LV e' lateral:   8.05 cm/s LVOT diam:     1.90 cm   LV E/e' lateral: 9.2 LV SV:         77 LV SV Index:   41 LVOT Area:     2.84 cm  RIGHT VENTRICLE RV Basal diam:  3.30 cm RV Mid diam:    2.40 cm RV S prime:     15.70 cm/s TAPSE (M-mode): 3.0 cm LEFT ATRIUM             Index        RIGHT ATRIUM           Index LA diam:        4.00 cm 2.12 cm/m   RA Area:     14.80 cm LA Vol (A2C):   52.9 ml 28.05 ml/m  RA Volume:   38.50 ml  20.41 ml/m LA Vol (A4C):   54.0 ml 28.63 ml/m LA Biplane Vol: 55.5 ml 29.43 ml/m  AORTIC VALVE                     PULMONIC VALVE AV Area (Vmax):    1.66 cm      PV Vmax:       1.05 m/s AV Area (Vmean):   1.69 cm      PV Peak grad:  4.4 mmHg AV Area (VTI):     2.03 cm AV Vmax:           174.00 cm/s AV Vmean:          118.000 cm/s AV VTI:            0.380 m AV Peak Grad:      12.1 mmHg AV Mean Grad:      6.0 mmHg LVOT Vmax:         102.00 cm/s LVOT Vmean:        70.400 cm/s LVOT VTI:          0.272 m LVOT/AV VTI ratio: 0.72  AORTA Ao Root diam: 3.20 cm MITRAL VALVE                TRICUSPID VALVE MV Area (PHT): 2.92 cm     TR Peak grad:   17.0 mmHg MV Area VTI:   2.38 cm     TR Vmax:        206.00 cm/s MV Peak grad:  4.9 mmHg MV Mean grad:  2.0 mmHg     SHUNTS MV Vmax:       1.11 m/s     Systemic VTI:  0.27 m MV Vmean:      63.0 cm/s    Systemic Diam: 1.90 cm MV  Decel Time: 260 msec MV E velocity: 73.70 cm/s MV A velocity: 104.00 cm/s MV E/A ratio:  0.71 Rudean Haskell MD Electronically signed by Rudean Haskell MD Signature Date/Time: 12/08/2021/2:14:12 PM    Final    CT Angio Chest Pulmonary Embolism (PE) W or WO Contrast  Result Date: 12/08/2021 CLINICAL DATA:  Positive D-dimer.  DVT EXAM: CT ANGIOGRAPHY CHEST WITH CONTRAST TECHNIQUE: Multidetector CT imaging of the chest was performed using the standard protocol during bolus administration  of intravenous contrast. Multiplanar CT image reconstructions and MIPs were obtained to evaluate the vascular anatomy. RADIATION DOSE REDUCTION: This exam was performed according to the departmental dose-optimization program which includes automated exposure control, adjustment of the mA and/or kV according to patient size and/or use of iterative reconstruction technique. CONTRAST:  30m OMNIPAQUE IOHEXOL 350 MG/ML SOLN COMPARISON:  11/28/2021 FINDINGS: Cardiovascular: Satisfactory opacification of the pulmonary arteries to the segmental level. No evidence of pulmonary embolism. Normal heart size. No pericardial effusion. Extensive atheromatous calcification of the aorta and coronaries Mediastinum/Nodes: Negative for adenopathy. Lungs/Pleura: Unchanged pattern of multifocal consolidation affecting all lobes with ground-glass, nodular, and consolidative opacities. Lower lobe airspace opacity is new/progressed from July 2023 but remaining opacity is very similar. Central airways are clear. Mild emphysematous change. In an otherwise clear area of lung there is an 8 mm irregular pulmonary nodule which is known from prior CTs. Upper Abdomen: Atheromatous plaque. Dystrophic type calcification in the right posterior liver. Granulomatous calcifications in the spleen. Musculoskeletal: No acute finding. Generalized thoracic spine degeneration. Review of the MIP images confirms the above findings. IMPRESSION: 1. Negative for  pulmonary embolism. 2. Multi lobar airspace disease much of which is chronic when compared to a July 2023 CT. Lingular opacity is definitely chronic when compared to a January 2023 CT, and progressive. Consider atypical and non infectious pneumonias. Consider alveolar tumor. 3. Known spiculated nodule in the right upper lobe. Electronically Signed   By: JJorje GuildM.D.   On: 12/08/2021 12:02   [place]ous Img Lower Bilateral (DVT)  Result Date: 12/08/2021 CLINICAL DATA:  Bilateral lower extremity edema. EXAM: BILATERAL LOWER EXTREMITY VENOUS DOPPLER ULTRASOUND TECHNIQUE: Gray-scale sonography with graded compression, as well as color Doppler and duplex ultrasound were performed to evaluate the lower extremity deep venous systems from the level of the common femoral vein and including the common femoral, femoral, profunda femoral, popliteal and calf veins including the posterior tibial, peroneal and gastrocnemius veins when visible. The superficial great saphenous vein was also interrogated. Spectral Doppler was utilized to evaluate flow at rest and with distal augmentation maneuvers in the common femoral, femoral and popliteal veins. COMPARISON:  None Available. FINDINGS: RIGHT LOWER EXTREMITY Common Femoral Vein: No evidence of thrombus. Normal compressibility, respiratory phasicity and response to augmentation. Saphenofemoral Junction: No evidence of thrombus. Normal compressibility and flow on color Doppler imaging. Profunda Femoral Vein: No evidence of thrombus. Normal compressibility and flow on color Doppler imaging. Femoral Vein: No evidence of thrombus. Normal compressibility, respiratory phasicity and response to augmentation. Popliteal Vein: No evidence of thrombus. Normal compressibility, respiratory phasicity and response to augmentation. Calf Veins: Normal patency of posterior tibial vein. There is some visualized thrombus in the right peroneal vein which appears occlusive in the proximal calf.  Gastrocnemius thrombus identified in the right calf. Superficial Great Saphenous Vein: No evidence of thrombus. Normal compressibility. Venous Reflux:  None. Other Findings: No evidence of superficial thrombophlebitis or abnormal fluid collection. LEFT LOWER EXTREMITY Common Femoral Vein: No evidence of thrombus. Normal compressibility, respiratory phasicity and response to augmentation. Saphenofemoral Junction: No evidence of thrombus. Normal compressibility and flow on color Doppler imaging. Profunda Femoral Vein: No evidence of thrombus. Normal compressibility and flow on color Doppler imaging. Femoral Vein: No evidence of thrombus. Normal compressibility, respiratory phasicity and response to augmentation. Popliteal Vein: No evidence of thrombus. Normal compressibility, respiratory phasicity and response to augmentation. Calf Veins: Posterior tibial vein is normally patent. Left peroneal vein DVT visualized. Superficial Great Saphenous Vein: No evidence  of thrombus. Normal compressibility. Venous Reflux:  None. Other Findings: No evidence of superficial thrombophlebitis or abnormal fluid collection. IMPRESSION: Positive for bilateral calf DVT in the peroneal veins and right gastrocnemius vein. Electronically Signed   By: Aletta Edouard M.D.   On: 12/08/2021 11:50   DG Chest Port 1 View  Result Date: 12/07/2021 CLINICAL DATA:  Questionable sepsis. Evaluate for abnormality. Hypoxemia. History of asthma/COPD. EXAM: PORTABLE CHEST 1 VIEW COMPARISON:  Radiographs 11/29/2021 and 11/28/2021.  CT 11/28/2021. FINDINGS: 1509 hours. The heart size and mediastinal contours appear stable. Compared with the most recent studies, there is partial clearing of the bibasilar airspace opacities, most consistent with improving pneumonia. No pneumothorax or significant pleural effusion identified. No acute osseous findings are seen. Telemetry leads overlie the chest. There is multilevel thoracic spondylosis. IMPRESSION:  Interval partial clearing of bibasilar airspace opacities, most consistent with improving pneumonia. Continued follow-up recommended to document complete clearing. No new findings identified. Electronically Signed   By: Richardean Sale M.D.   On: 12/07/2021 15:27   DG Chest 1 View  Result Date: 11/29/2021 CLINICAL DATA:  Shortness of breath EXAM: CHEST  1 VIEW COMPARISON:  Chest x-ray 11/28/2021. FINDINGS: Cardiomediastinal silhouette is within normal limits. There is increasing patchy airspace disease in the bilateral mid and lower lungs. There is no pleural effusion or pneumothorax. No acute fractures are seen. There is a healed left humeral neck fracture. IMPRESSION: Increasing patchy airspace disease in the bilateral mid and lower lungs, concerning for multifocal pneumonia. Electronically Signed   By: Ronney Asters M.D.   On: 11/29/2021 20:36   CT Angio Chest PE W and/or Wo Contrast  Result Date: 11/28/2021 CLINICAL DATA:  PE suspected.  Low O2 sats. EXAM: CT ANGIOGRAPHY CHEST WITH CONTRAST TECHNIQUE: Multidetector CT imaging of the chest was performed using the standard protocol during bolus administration of intravenous contrast. Multiplanar CT image reconstructions and MIPs were obtained to evaluate the vascular anatomy. RADIATION DOSE REDUCTION: This exam was performed according to the departmental dose-optimization program which includes automated exposure control, adjustment of the mA and/or kV according to patient size and/or use of iterative reconstruction technique. CONTRAST:  43m OMNIPAQUE IOHEXOL 350 MG/ML SOLN COMPARISON:  CT chest 09/20/2021 FINDINGS: Cardiovascular: Satisfactory opacification of the pulmonary arteries to the segmental level. No evidence of pulmonary embolism. Normal heart size. No pericardial effusion. Calcific aortic atherosclerosis. Multivessel coronary artery calcification. Mediastinum/Nodes: No enlarged mediastinal, hilar, or axillary lymph nodes. Thyroid gland and  trachea demonstrate no significant findings. Patulous esophagus. Lungs/Pleura: A 1.0 x 0.8 cm irregular nodule in the anterior right upper lobe appears subsolid on today's exam (series 6, image 57). Irregular consolidation with air bronchograms and surrounding ground-glass opacities dependently located in the left upper lobe is not significantly changed compared to 09/20/2021. Airspace consolidation with air bronchograms dependently located in the right upper and right middle lobes with adjacent ground-glass opacities are increased compared to 09/20/2021. Similarly, patchy consolidations with surrounding ground-glass opacities in the lower lungs are also increased compared to 09/16/2021, particularly in the right lower lobe. No pleural effusion or pneumothorax. Upper Abdomen: No acute abnormality. Benign left hepatic cyst again noted. No follow-up imaging indicated. Musculoskeletal: No chest wall abnormality. No acute or suspicious osseous findings. Multilevel degenerative changes in the thoracic and visualized upper lumbar spine. Review of the MIP images confirms the above findings. IMPRESSION: 1. No pulmonary embolism. 2. Multifocal, dependently located, consolidations bilaterally, favored to represent multifocal pneumonia, possibly aspiration related. Recommend follow-up CT imaging in 3  months after treatment to ensure resolution. 3. The 1 cm nodule in the anterior right upper lobe appears subsolid on today's exam. As previously noted, bronchogenic carcinoma cannot be excluded. Recommend attention on follow-up imaging. 4. Multivessel coronary artery disease. Aortic Atherosclerosis (ICD10-I70.0). Electronically Signed   By: Ileana Roup M.D.   On: 11/28/2021 16:15   DG Chest Port 1 View  Result Date: 11/28/2021 CLINICAL DATA:  Dyspnea, shaking, hypoxia, history COPD, asthma, diabetes mellitus, hypertension EXAM: PORTABLE CHEST 1 VIEW COMPARISON:  Portable exam 1304 hours compared to 10/31/2021 FINDINGS: Upper  normal size of cardiac silhouette. Mediastinal contours and pulmonary vascularity normal. Atherosclerotic calcification aorta. Bibasilar infiltrates question multifocal pneumonia. Minimal pleural effusions. No pneumothorax. Bones demineralized with posttraumatic deformity proximal LEFT humerus and multilevel degenerative disc disease changes/scoliosis thoracic spine. IMPRESSION: Bibasilar pulmonary infiltrates question multifocal pneumonia. Minimal pleural effusions. Aortic Atherosclerosis (ICD10-I70.0). Electronically Signed   By: Lavonia Dana M.D.   On: 11/28/2021 13:15     Subjective: Pt sitting up in chair, pt breathing better, off oxygen at times with no SOB.   Discharge Exam: Vitals:   12/20/21 0715 12/20/21 0900  BP:  (!) 114/46  Pulse:  86  Resp:  18  Temp:  98.8 F (37.1 C)  SpO2: 94% 98%   Vitals:   12/20/21 0439 12/20/21 0455 12/20/21 0715 12/20/21 0900  BP: (!) 143/49 (!) 142/62  (!) 114/46  Pulse: 74   86  Resp: 20   18  Temp: 98.5 F (36.9 C)   98.8 F (37.1 C)  TempSrc:    Oral  SpO2: 90%  94% 98%  Weight:      Height:       General: Pt is alert, awake, not in acute distress Cardiovascular: RRR, S1/S2 +, no rubs, no gallops Respiratory: no increased work of breathing Abdominal: Soft, NT, ND, bowel sounds + Extremities: no edema, no cyanosis, TED hoses on   The results of significant diagnostics from this hospitalization (including imaging, microbiology, ancillary and laboratory) are listed below for reference.     Microbiology: Recent Results (from the past 240 hour(s))  MRSA Next Gen by PCR, Nasal     Status: None   Collection Time: 12/10/21  5:50 PM   Specimen: Nasal Mucosa; Nasal Swab  Result Value Ref Range Status   MRSA by PCR Next Gen NOT DETECTED NOT DETECTED Final    Comment: (NOTE) The GeneXpert MRSA Assay (FDA approved for NASAL specimens only), is one component of a comprehensive MRSA colonization surveillance program. It is not intended to  diagnose MRSA infection nor to guide or monitor treatment for MRSA infections. Test performance is not FDA approved in patients less than 4 years old. Performed at [facility], [address] [place], [place] [address]      Labs: BNP (last 3 results) Recent Labs    12/07/21 1642 12/13/21 0308 12/17/21 0340  BNP 116.0* 94.0 952.8   Basic Metabolic Panel: Recent Labs  Lab 12/14/21 0318 12/15/21 1252 12/16/21 0340 12/17/21 0340 12/18/21 0355 12/19/21 0436  NA 138 133* 136 134* 136 136  K 4.4 5.6* 5.0 4.8 4.9 5.1  CL 102 100 103 101 101 101  CO2 '29 27 26 26 30 29  '$ GLUCOSE 106* 199* 190* 289* 169* 126*  BUN 39* 49* 45* 38* 48* 42*  CREATININE 0.70 0.89 0.68 0.67 0.72 0.68  CALCIUM 10.8* 10.2 10.3 9.9 10.4* 10.3  MG 2.0  --   --  2.1 2.1 2.1  Liver Function Tests: Recent Labs  Lab 12/16/21 0340  AST 41  ALT 78*  ALKPHOS 37*  BILITOT 0.4  PROT 5.7*  ALBUMIN 2.8*   No results for input(s): "LIPASE", "AMYLASE" in the last 168 hours. No results for input(s): "AMMONIA" in the last 168 hours. CBC: Recent Labs  Lab 12/14/21 0318 12/16/21 0340 12/17/21 0340 12/19/21 0436  WBC 14.1* 16.8* 12.0* 16.3*  HGB 10.1* 9.3* 9.3* 9.6*  HCT 33.3* 30.8* 30.2* 30.8*  MCV 82.6 84.2 83.9 84.6  PLT 185 156 150 166   Cardiac Enzymes: No results for input(s): "CKTOTAL", "CKMB", "CKMBINDEX", "TROPONINI" in the last 168 hours. BNP: Invalid input(s): "POCBNP" CBG: Recent Labs  Lab 12/19/21 1600 12/19/21 2116 12/19/21 2122 12/19/21 2205 12/20/21 1143  GLUCAP 103* 47* 42* 112* 318*   D-Dimer No results for input(s): "DDIMER" in the last 72 hours. Hgb A1c No results for input(s): "HGBA1C" in the last 72 hours. Lipid Profile No results for input(s): "CHOL", "HDL", "LDLCALC", "TRIG", "CHOLHDL", "LDLDIRECT" in the last 72 hours. Thyroid function studies No results for input(s): "TSH", "T4TOTAL", "T3FREE", "THYROIDAB" in the last 72 hours.  Invalid input(s):  "FREET3" Anemia work up No results for input(s): "VITAMINB12", "FOLATE", "FERRITIN", "TIBC", "IRON", "RETICCTPCT" in the last 72 hours. Urinalysis No results found for: "COLORURINE", "APPEARANCEUR", "LABSPEC", "PHURINE", "GLUCOSEU", "HGBUR", "BILIRUBINUR", "KETONESUR", "PROTEINUR", "UROBILINOGEN", "NITRITE", "LEUKOCYTESUR" Sepsis Labs Recent Labs  Lab 12/14/21 0318 12/16/21 0340 12/17/21 0340 12/19/21 0436  WBC 14.1* 16.8* 12.0* 16.3*   Microbiology Recent Results (from the past 240 hour(s))  MRSA Next Gen by PCR, Nasal     Status: None   Collection Time: 12/10/21  5:50 PM   Specimen: Nasal Mucosa; Nasal Swab  Result Value Ref Range Status   MRSA by PCR Next Gen NOT DETECTED NOT DETECTED Final    Comment: (NOTE) The GeneXpert MRSA Assay (FDA approved for NASAL specimens only), is one component of a comprehensive MRSA colonization surveillance program. It is not intended to diagnose MRSA infection nor to guide or monitor treatment for MRSA infections. Test performance is not FDA approved in patients less than 37 years old. Performed at [facility] [address], [place], [place] [address]     Time coordinating discharge: 44 mins   SIGNED:  Irwin Brakeman, MD  [facility] 12/20/2021, 1:10 PM How to contact the [facility] Attending or Consulting provider [place] or covering provider during after hours [place], for this patient?  Check the care team in [facility] and look for a) attending/consulting [facility] provider listed and b) the [facility] team listed Log into [email or web address] and use [place]'s universal password to access. If you do not have the password, please contact the hospital operator. Locate the [facility] provider you are looking for under [facility] and page to a number that you can be directly reached. If you still have difficulty reaching the provider, please page the [facility] (Director on Call) for the Hospitalists listed on amion for assistance.

## 2021-12-20 NOTE — Discharge Instructions (Addendum)
PLEASE MONITOR BLOOD SUGARS AT LEAST 4 TIMES PER DAY   CALL DOCTOR FOR ANY 2 BLOOD SUGARS LESS THAN 100      IMPORTANT INFORMATION: PAY CLOSE ATTENTION   PHYSICIAN DISCHARGE INSTRUCTIONS  Follow with Primary care provider  [name], MD  and other consultants as instructed by your Hospitalist Physician  [place] IF SYMPTOMS COME BACK, WORSEN OR NEW PROBLEM DEVELOPS   Please note: You were cared for by a hospitalist during your hospital stay. Every effort will be made to forward records to your primary care provider.  You can request that your primary care provider send for your hospital records if they have not received them.  Once you are discharged, your primary care physician will handle any further medical issues. Please note that NO REFILLS for any discharge medications will be authorized once you are discharged, as it is imperative that you return to your primary care physician (or establish a relationship with a primary care physician if you do not have one) for your post hospital discharge needs so that they can reassess your need for medications and monitor your lab values.  Please get a complete blood count and chemistry panel checked by your Primary MD at your next visit, and again as instructed by your Primary MD.  Get Medicines reviewed and adjusted: Please take all your medications with you for your next visit with your Primary MD  Laboratory/radiological data: Please request your Primary MD to go over all hospital tests and procedure/radiological results at the follow up, please ask your primary care provider to get all Hospital records sent to his/her office.  In some cases, they will be blood work, cultures and biopsy results pending at the time of your discharge. Please request that your primary care provider follow up on these results.  If you are diabetic, please bring your blood sugar readings with you to your follow up  appointment with primary care.    Please call and make your follow up appointments as soon as possible.    Also Note the following: If you experience worsening of your admission symptoms, develop shortness of breath, life threatening emergency, suicidal or homicidal thoughts you must seek medical attention immediately by calling 911 or calling your MD immediately  if symptoms less severe.  You must read complete instructions/literature along with all the possible adverse reactions/side effects for all the Medicines you take and that have been prescribed to you. Take any new Medicines after you have completely understood and accpet all the possible adverse reactions/side effects.   Do not drive when taking Pain medications or sleeping medications (Benzodiazepines)  Do not take more than prescribed Pain, Sleep and Anxiety Medications. It is not advisable to combine anxiety,sleep and pain medications without talking with your primary care practitioner  Special Instructions: If you have smoked or chewed Tobacco  in the last 2 yrs please stop smoking, stop any regular Alcohol  and or any Recreational drug use.  Wear Seat belts while driving.  Do not drive if taking any narcotic, mind altering or controlled substances or recreational drugs or alcohol.

## 2021-12-20 NOTE — Telephone Encounter (Signed)
ATC patients son [name]. LVM letting him know that patient will need to be walked/ checked on O2 in office during her appt on 11/27 with Dr. Halford Chessman. Advised him to call back if he wanted to discuss further/ had any questions.

## 2021-12-20 NOTE — Care Management Important Message (Signed)
Important Message  Patient Details  Name: Michaela Moon MRN: [id number] Date of Birth: 05-Aug-1935   Medicare Important Message Given:  Yes     Tommy Medal 12/20/2021, 12:08 PM

## 2021-12-20 NOTE — TOC Transition Note (Signed)
Transition of Care [facility]) - CM/SW Discharge Note   Patient Details  Name: Michaela Moon MRN: [id number] Date of Birth: Jun 11, 1935  [facility] of [facility] CM/SW Contact:  Ihor Gully, LCSW Phone Number: 12/20/2021, 2:16 PM   Clinical Narrative:    Son requesting hospital bed. Patient is active with [employer] with home oxygen. Bed ordered via [place] at [place]. Zach notified of increased liter flow. Patient interested in back pack portable oxygen, however her liter flow is too high for these and she will have to utilize her current portable tanks.    Final next level of care: Home/Self Care Barriers to Discharge: No Barriers Identified   Patient Goals and CMS Choice        Discharge Placement                       Discharge Plan and Services                DME Arranged: Hospital bed DME Agency: [employer] Date DME Agency Contacted: 12/20/21 Time DME Agency Contacted: [id number] Representative spoke with at DME Agency: [place] ([place]) Interventions     Readmission Risk Interventions     No data to display

## 2021-12-20 NOTE — Progress Notes (Signed)
Pt rested through out he night. Pt was able to make needs known to nursing staff. Denies any pain or discomfort. Will continue to monitor.

## 2021-12-24 DIAGNOSIS — J440 Chronic obstructive pulmonary disease with acute lower respiratory infection: Secondary | ICD-10-CM | POA: Diagnosis not present

## 2021-12-24 DIAGNOSIS — I10 Essential (primary) hypertension: Secondary | ICD-10-CM | POA: Diagnosis not present

## 2021-12-24 DIAGNOSIS — E1142 Type 2 diabetes mellitus with diabetic polyneuropathy: Secondary | ICD-10-CM | POA: Diagnosis not present

## 2021-12-24 DIAGNOSIS — J9621 Acute and chronic respiratory failure with hypoxia: Secondary | ICD-10-CM | POA: Diagnosis not present

## 2021-12-24 DIAGNOSIS — U071 COVID-19: Secondary | ICD-10-CM | POA: Diagnosis not present

## 2021-12-25 ENCOUNTER — Emergency Department (HOSPITAL_COMMUNITY): Payer: Medicare Other

## 2021-12-25 ENCOUNTER — Inpatient Hospital Stay (HOSPITAL_COMMUNITY)
Admission: EM | Admit: 2021-12-25 | Discharge: 2022-01-01 | DRG: 189 | Disposition: A | Discharge status: Home Health | Payer: Medicare Other | Attending: Family Medicine | Admitting: Family Medicine

## 2021-12-25 ENCOUNTER — Encounter (HOSPITAL_COMMUNITY): Payer: Self-pay | Admitting: *Deleted

## 2021-12-25 ENCOUNTER — Other Ambulatory Visit: Payer: Self-pay

## 2021-12-25 DIAGNOSIS — G629 Polyneuropathy, unspecified: Secondary | ICD-10-CM

## 2021-12-25 DIAGNOSIS — N281 Cyst of kidney, acquired: Secondary | ICD-10-CM | POA: Diagnosis not present

## 2021-12-25 DIAGNOSIS — Z7982 Long term (current) use of aspirin: Secondary | ICD-10-CM

## 2021-12-25 DIAGNOSIS — Z8616 Personal history of COVID-19: Secondary | ICD-10-CM | POA: Diagnosis not present

## 2021-12-25 DIAGNOSIS — E782 Mixed hyperlipidemia: Secondary | ICD-10-CM | POA: Diagnosis not present

## 2021-12-25 DIAGNOSIS — J690 Pneumonitis due to inhalation of food and vomit: Secondary | ICD-10-CM | POA: Diagnosis not present

## 2021-12-25 DIAGNOSIS — I700 Atherosclerosis of aorta: Secondary | ICD-10-CM | POA: Diagnosis present

## 2021-12-25 DIAGNOSIS — E162 Hypoglycemia, unspecified: Secondary | ICD-10-CM | POA: Diagnosis present

## 2021-12-25 DIAGNOSIS — T68XXXA Hypothermia, initial encounter: Secondary | ICD-10-CM | POA: Diagnosis present

## 2021-12-25 DIAGNOSIS — K219 Gastro-esophageal reflux disease without esophagitis: Secondary | ICD-10-CM | POA: Diagnosis present

## 2021-12-25 DIAGNOSIS — Z79899 Other long term (current) drug therapy: Secondary | ICD-10-CM

## 2021-12-25 DIAGNOSIS — J9601 Acute respiratory failure with hypoxia: Secondary | ICD-10-CM | POA: Diagnosis not present

## 2021-12-25 DIAGNOSIS — Z794 Long term (current) use of insulin: Secondary | ICD-10-CM

## 2021-12-25 DIAGNOSIS — J9621 Acute and chronic respiratory failure with hypoxia: Principal | ICD-10-CM | POA: Diagnosis present

## 2021-12-25 DIAGNOSIS — E1140 Type 2 diabetes mellitus with diabetic neuropathy, unspecified: Secondary | ICD-10-CM | POA: Diagnosis not present

## 2021-12-25 DIAGNOSIS — M8580 Other specified disorders of bone density and structure, unspecified site: Secondary | ICD-10-CM | POA: Diagnosis not present

## 2021-12-25 DIAGNOSIS — R1310 Dysphagia, unspecified: Secondary | ICD-10-CM | POA: Diagnosis not present

## 2021-12-25 DIAGNOSIS — Z86718 Personal history of other venous thrombosis and embolism: Secondary | ICD-10-CM

## 2021-12-25 DIAGNOSIS — Z7951 Long term (current) use of inhaled steroids: Secondary | ICD-10-CM

## 2021-12-25 DIAGNOSIS — E876 Hypokalemia: Secondary | ICD-10-CM | POA: Diagnosis not present

## 2021-12-25 DIAGNOSIS — R0602 Shortness of breath: Secondary | ICD-10-CM | POA: Diagnosis not present

## 2021-12-25 DIAGNOSIS — Z7984 Long term (current) use of oral hypoglycemic drugs: Secondary | ICD-10-CM

## 2021-12-25 DIAGNOSIS — T380X5A Adverse effect of glucocorticoids and synthetic analogues, initial encounter: Secondary | ICD-10-CM | POA: Diagnosis not present

## 2021-12-25 DIAGNOSIS — Z743 Need for continuous supervision: Secondary | ICD-10-CM | POA: Diagnosis not present

## 2021-12-25 DIAGNOSIS — R1314 Dysphagia, pharyngoesophageal phase: Secondary | ICD-10-CM | POA: Diagnosis not present

## 2021-12-25 DIAGNOSIS — E11649 Type 2 diabetes mellitus with hypoglycemia without coma: Secondary | ICD-10-CM | POA: Diagnosis present

## 2021-12-25 DIAGNOSIS — K5900 Constipation, unspecified: Secondary | ICD-10-CM | POA: Diagnosis not present

## 2021-12-25 DIAGNOSIS — Z1152 Encounter for screening for COVID-19: Secondary | ICD-10-CM

## 2021-12-25 DIAGNOSIS — Y92239 Unspecified place in hospital as the place of occurrence of the external cause: Secondary | ICD-10-CM | POA: Diagnosis present

## 2021-12-25 DIAGNOSIS — Z808 Family history of malignant neoplasm of other organs or systems: Secondary | ICD-10-CM

## 2021-12-25 DIAGNOSIS — R001 Bradycardia, unspecified: Secondary | ICD-10-CM | POA: Diagnosis not present

## 2021-12-25 DIAGNOSIS — Z91018 Allergy to other foods: Secondary | ICD-10-CM

## 2021-12-25 DIAGNOSIS — Z87891 Personal history of nicotine dependence: Secondary | ICD-10-CM | POA: Diagnosis not present

## 2021-12-25 DIAGNOSIS — Z9981 Dependence on supplemental oxygen: Secondary | ICD-10-CM | POA: Diagnosis not present

## 2021-12-25 DIAGNOSIS — Z9842 Cataract extraction status, left eye: Secondary | ICD-10-CM

## 2021-12-25 DIAGNOSIS — Z66 Do not resuscitate: Secondary | ICD-10-CM | POA: Diagnosis not present

## 2021-12-25 DIAGNOSIS — R0600 Dyspnea, unspecified: Secondary | ICD-10-CM | POA: Diagnosis not present

## 2021-12-25 DIAGNOSIS — Z9841 Cataract extraction status, right eye: Secondary | ICD-10-CM

## 2021-12-25 DIAGNOSIS — Z7901 Long term (current) use of anticoagulants: Secondary | ICD-10-CM

## 2021-12-25 DIAGNOSIS — Z86010 Personal history of colonic polyps: Secondary | ICD-10-CM

## 2021-12-25 DIAGNOSIS — J441 Chronic obstructive pulmonary disease with (acute) exacerbation: Secondary | ICD-10-CM | POA: Diagnosis present

## 2021-12-25 DIAGNOSIS — I499 Cardiac arrhythmia, unspecified: Secondary | ICD-10-CM | POA: Diagnosis not present

## 2021-12-25 DIAGNOSIS — J189 Pneumonia, unspecified organism: Secondary | ICD-10-CM | POA: Diagnosis not present

## 2021-12-25 DIAGNOSIS — J90 Pleural effusion, not elsewhere classified: Secondary | ICD-10-CM | POA: Diagnosis not present

## 2021-12-25 DIAGNOSIS — E1165 Type 2 diabetes mellitus with hyperglycemia: Secondary | ICD-10-CM | POA: Diagnosis not present

## 2021-12-25 DIAGNOSIS — R6889 Other general symptoms and signs: Secondary | ICD-10-CM | POA: Diagnosis not present

## 2021-12-25 DIAGNOSIS — Z961 Presence of intraocular lens: Secondary | ICD-10-CM | POA: Diagnosis present

## 2021-12-25 DIAGNOSIS — I10 Essential (primary) hypertension: Secondary | ICD-10-CM | POA: Diagnosis not present

## 2021-12-25 DIAGNOSIS — R680 Hypothermia, not associated with low environmental temperature: Secondary | ICD-10-CM | POA: Diagnosis present

## 2021-12-25 DIAGNOSIS — E119 Type 2 diabetes mellitus without complications: Secondary | ICD-10-CM

## 2021-12-25 DIAGNOSIS — R0902 Hypoxemia: Secondary | ICD-10-CM | POA: Diagnosis not present

## 2021-12-25 DIAGNOSIS — R911 Solitary pulmonary nodule: Secondary | ICD-10-CM | POA: Diagnosis present

## 2021-12-25 LAB — EXPECTORATED SPUTUM ASSESSMENT W GRAM STAIN, RFLX TO RESP C

## 2021-12-25 LAB — BASIC METABOLIC PANEL
Anion gap: 5 (ref 5–15)
BUN: 33 mg/dL — ABNORMAL HIGH (ref 8–23)
CO2: 27 mmol/L (ref 22–32)
Calcium: 10.5 mg/dL — ABNORMAL HIGH (ref 8.9–10.3)
Chloride: 106 mmol/L (ref 98–111)
Creatinine, Ser: 0.54 mg/dL (ref 0.44–1.00)
GFR, Estimated: >60 mL/min (ref >60)
Glucose, Bld: 30 mg/dL — CL (ref 70–99)
Potassium: 3.4 mmol/L — ABNORMAL LOW (ref 3.5–5.1)
Sodium: 138 mmol/L (ref 135–145)

## 2021-12-25 LAB — CBC WITH DIFFERENTIAL/PLATELET
Abs Immature Granulocytes: 0.18 10*3/uL — ABNORMAL HIGH (ref 0.00–0.07)
Basophils Absolute: 0.1 10*3/uL (ref 0.0–0.1)
Basophils Relative: 0 %
Eosinophils Absolute: 0.1 10*3/uL (ref 0.0–0.5)
Eosinophils Relative: 0 %
HCT: 34 % — ABNORMAL LOW (ref 36.0–46.0)
Hemoglobin: 10.5 g/dL — ABNORMAL LOW (ref 12.0–15.0)
Immature Granulocytes: 1 %
Lymphocytes Relative: 6 %
Lymphs Abs: 1.6 10*3/uL (ref 0.7–4.0)
MCH: 26.8 pg (ref 26.0–34.0)
MCHC: 30.9 g/dL (ref 30.0–36.0)
MCV: 86.7 fL (ref 80.0–100.0)
Monocytes Absolute: 0.8 10*3/uL (ref 0.1–1.0)
Monocytes Relative: 3 %
Neutro Abs: 22.8 10*3/uL — ABNORMAL HIGH (ref 1.7–7.7)
Neutrophils Relative %: 90 %
Platelets: 155 10*3/uL (ref 150–400)
RBC: 3.92 MIL/uL (ref 3.87–5.11)
RDW: 27.8 % — ABNORMAL HIGH (ref 11.5–15.5)
WBC: 25.4 10*3/uL — ABNORMAL HIGH (ref 4.0–10.5)
nRBC: 0 % (ref 0.0–0.2)

## 2021-12-25 LAB — BLOOD GAS, ARTERIAL
Acid-base deficit: 3.1 mmol/L — ABNORMAL HIGH (ref 0.0–2.0)
Bicarbonate: 22 mmol/L (ref 20.0–28.0)
Drawn by: 23430
FIO2: 80 %
O2 Saturation: 96 %
Patient temperature: 35.3
pCO2 arterial: 36 mmHg (ref 32–48)
pH, Arterial: 7.38 (ref 7.35–7.45)
pO2, Arterial: 66 mmHg — ABNORMAL LOW (ref 83–108)

## 2021-12-25 LAB — GLUCOSE, CAPILLARY
Glucose-Capillary: 140 mg/dL — ABNORMAL HIGH (ref 70–99)
Glucose-Capillary: 274 mg/dL — ABNORMAL HIGH (ref 70–99)

## 2021-12-25 LAB — SARS CORONAVIRUS 2 BY RT PCR: SARS Coronavirus 2 by RT PCR: NEGATIVE

## 2021-12-25 LAB — LACTIC ACID, PLASMA
Lactic Acid, Venous: 1.2 mmol/L (ref 0.5–1.9)
Lactic Acid, Venous: 2.3 mmol/L (ref 0.5–1.9)

## 2021-12-25 LAB — BRAIN NATRIURETIC PEPTIDE: B Natriuretic Peptide: 56 pg/mL (ref 0.0–100.0)

## 2021-12-25 LAB — CBG MONITORING, ED
Glucose-Capillary: 23 mg/dL — CL (ref 70–99)
Glucose-Capillary: 144 mg/dL — ABNORMAL HIGH (ref 70–99)
Glucose-Capillary: 126 mg/dL — ABNORMAL HIGH (ref 70–99)

## 2021-12-25 LAB — PROCALCITONIN: Procalcitonin: <0.1 ng/mL

## 2021-12-25 MED ORDER — ARFORMOTEROL TARTRATE 15 MCG/2ML IN NEBU
15.0000 ug | INHALATION_SOLUTION | Freq: Two times a day (BID) | RESPIRATORY_TRACT | Status: DC
  Administered 2021-12-25 – 2022-01-01 (×15): 15 ug via RESPIRATORY_TRACT
  Filled 2021-12-25 (×14): qty 2

## 2021-12-25 MED ORDER — METHYLPREDNISOLONE SODIUM SUCC 125 MG IJ SOLR
125.0000 mg | Freq: Once | INTRAMUSCULAR | Status: AC
  Administered 2021-12-25: 125 mg via INTRAVENOUS
  Filled 2021-12-25: qty 2

## 2021-12-25 MED ORDER — METOPROLOL TARTRATE 50 MG PO TABS
50.0000 mg | ORAL_TABLET | Freq: Two times a day (BID) | ORAL | Status: DC
  Administered 2021-12-25 – 2021-12-28 (×7): 50 mg via ORAL
  Filled 2021-12-25 (×7): qty 1

## 2021-12-25 MED ORDER — VANCOMYCIN HCL 1500 MG/300ML IV SOLN
1500.0000 mg | Freq: Once | INTRAVENOUS | Status: AC
  Administered 2021-12-25: 1500 mg via INTRAVENOUS
  Filled 2021-12-25: qty 300

## 2021-12-25 MED ORDER — ALBUTEROL SULFATE (2.5 MG/3ML) 0.083% IN NEBU: 2.5000 mg | INHALATION_SOLUTION | Freq: Four times a day (QID) | RESPIRATORY_TRACT | Status: DC | PRN

## 2021-12-25 MED ORDER — INSULIN ASPART 100 UNIT/ML IJ SOLN
0.0000 [IU] | Freq: Three times a day (TID) | INTRAMUSCULAR | Status: DC
  Administered 2021-12-26: 2 [IU] via SUBCUTANEOUS
  Administered 2021-12-26: 4 [IU] via SUBCUTANEOUS
  Administered 2021-12-26: 1 [IU] via SUBCUTANEOUS
  Administered 2021-12-27 (×2): 2 [IU] via SUBCUTANEOUS
  Administered 2021-12-27: 4 [IU] via SUBCUTANEOUS
  Administered 2021-12-28: 1 [IU] via SUBCUTANEOUS

## 2021-12-25 MED ORDER — GUAIFENESIN ER 600 MG PO TB12
1200.0000 mg | ORAL_TABLET | Freq: Two times a day (BID) | ORAL | Status: DC | PRN
  Administered 2021-12-29: 1200 mg via ORAL
  Filled 2021-12-25: qty 2

## 2021-12-25 MED ORDER — ONDANSETRON HCL 4 MG/2ML IJ SOLN: 4.0000 mg | Freq: Four times a day (QID) | INTRAMUSCULAR | Status: DC | PRN

## 2021-12-25 MED ORDER — PANTOPRAZOLE SODIUM 40 MG PO TBEC
40.0000 mg | DELAYED_RELEASE_TABLET | Freq: Every day | ORAL | Status: DC
  Administered 2021-12-25 – 2022-01-01 (×8): 40 mg via ORAL
  Filled 2021-12-25 (×8): qty 1

## 2021-12-25 MED ORDER — METHYLPREDNISOLONE SODIUM SUCC 40 MG IJ SOLR
40.0000 mg | Freq: Two times a day (BID) | INTRAMUSCULAR | Status: DC
  Administered 2021-12-25 – 2021-12-27 (×4): 40 mg via INTRAVENOUS
  Filled 2021-12-25 (×4): qty 1

## 2021-12-25 MED ORDER — ACETAMINOPHEN 650 MG RE SUPP: 650.0000 mg | Freq: Four times a day (QID) | RECTAL | Status: DC | PRN

## 2021-12-25 MED ORDER — IPRATROPIUM-ALBUTEROL 0.5-2.5 (3) MG/3ML IN SOLN
3.0000 mL | Freq: Four times a day (QID) | RESPIRATORY_TRACT | Status: DC
  Administered 2021-12-25 – 2022-01-01 (×29): 3 mL via RESPIRATORY_TRACT
  Filled 2021-12-25 (×28): qty 3

## 2021-12-25 MED ORDER — BUDESONIDE 0.25 MG/2ML IN SUSP
0.2500 mg | Freq: Two times a day (BID) | RESPIRATORY_TRACT | Status: DC
  Administered 2021-12-25 – 2022-01-01 (×15): 0.25 mg via RESPIRATORY_TRACT
  Filled 2021-12-25 (×14): qty 2

## 2021-12-25 MED ORDER — GABAPENTIN 100 MG PO CAPS
100.0000 mg | ORAL_CAPSULE | Freq: Three times a day (TID) | ORAL | Status: DC
  Administered 2021-12-25 – 2022-01-01 (×22): 100 mg via ORAL
  Filled 2021-12-25 (×22): qty 1

## 2021-12-25 MED ORDER — MONTELUKAST SODIUM 10 MG PO TABS
10.0000 mg | ORAL_TABLET | Freq: Every day | ORAL | Status: DC
  Administered 2021-12-25 – 2021-12-31 (×7): 10 mg via ORAL
  Filled 2021-12-25 (×7): qty 1

## 2021-12-25 MED ORDER — BUDESONIDE 0.5 MG/2ML IN SUSP
RESPIRATORY_TRACT | Status: AC
  Administered 2021-12-25: 0.25 mg
  Filled 2021-12-25: qty 2

## 2021-12-25 MED ORDER — APIXABAN 5 MG PO TABS
5.0000 mg | ORAL_TABLET | Freq: Two times a day (BID) | ORAL | Status: DC
  Administered 2021-12-25 – 2022-01-01 (×14): 5 mg via ORAL
  Filled 2021-12-25 (×14): qty 1

## 2021-12-25 MED ORDER — ONDANSETRON HCL 4 MG PO TABS: 4.0000 mg | ORAL_TABLET | Freq: Four times a day (QID) | ORAL | Status: DC | PRN

## 2021-12-25 MED ORDER — OXYBUTYNIN CHLORIDE 5 MG PO TABS: 5.0000 mg | ORAL_TABLET | Freq: Three times a day (TID) | ORAL | Status: DC | PRN

## 2021-12-25 MED ORDER — SODIUM CHLORIDE 0.9 % IV SOLN
2.0000 g | Freq: Once | INTRAVENOUS | Status: AC
  Administered 2021-12-25: 2 g via INTRAVENOUS
  Filled 2021-12-25: qty 12.5

## 2021-12-25 MED ORDER — ACETAMINOPHEN 325 MG PO TABS
650.0000 mg | ORAL_TABLET | Freq: Four times a day (QID) | ORAL | Status: DC | PRN
  Administered 2021-12-30: 650 mg via ORAL
  Filled 2021-12-25: qty 2

## 2021-12-25 MED ORDER — ATORVASTATIN CALCIUM 40 MG PO TABS
40.0000 mg | ORAL_TABLET | Freq: Every day | ORAL | Status: DC
  Administered 2021-12-25 – 2022-01-01 (×8): 40 mg via ORAL
  Filled 2021-12-25 (×8): qty 1

## 2021-12-25 MED ORDER — SODIUM CHLORIDE 0.9 % IV SOLN
2.0000 g | Freq: Two times a day (BID) | INTRAVENOUS | Status: AC
  Administered 2021-12-25 – 2021-12-30 (×10): 2 g via INTRAVENOUS
  Filled 2021-12-25 (×10): qty 12.5

## 2021-12-25 MED ORDER — IPRATROPIUM-ALBUTEROL 0.5-2.5 (3) MG/3ML IN SOLN
RESPIRATORY_TRACT | Status: AC
  Administered 2021-12-25: 3 mL via RESPIRATORY_TRACT
  Filled 2021-12-25: qty 3

## 2021-12-25 MED ORDER — DEXTROSE 50 % IV SOLN
1.0000 | Freq: Once | INTRAVENOUS | Status: AC
  Administered 2021-12-25: 50 mL via INTRAVENOUS

## 2021-12-25 MED ORDER — IOHEXOL 350 MG/ML SOLN
100.0000 mL | Freq: Once | INTRAVENOUS | Status: AC | PRN
  Administered 2021-12-25: 75 mL via INTRAVENOUS

## 2021-12-25 MED ORDER — FUROSEMIDE 10 MG/ML IJ SOLN
40.0000 mg | Freq: Once | INTRAMUSCULAR | Status: AC
  Administered 2021-12-25: 40 mg via INTRAVENOUS
  Filled 2021-12-25: qty 4

## 2021-12-25 MED ORDER — VANCOMYCIN HCL 1250 MG/250ML IV SOLN
1250.0000 mg | INTRAVENOUS | Status: DC
  Administered 2021-12-26 – 2021-12-28 (×3): 1250 mg via INTRAVENOUS
  Filled 2021-12-25 (×3): qty 250

## 2021-12-25 MED ORDER — IPRATROPIUM-ALBUTEROL 0.5-2.5 (3) MG/3ML IN SOLN
3.0000 mL | Freq: Once | RESPIRATORY_TRACT | Status: AC
  Administered 2021-12-25: 3 mL via RESPIRATORY_TRACT
  Filled 2021-12-25: qty 3

## 2021-12-25 MED ORDER — ASPIRIN 81 MG PO CHEW
81.0000 mg | CHEWABLE_TABLET | Freq: Every day | ORAL | Status: DC
  Administered 2021-12-25 – 2022-01-01 (×8): 81 mg via ORAL
  Filled 2021-12-25 (×8): qty 1

## 2021-12-25 MED ORDER — DEXTROSE 50 % IV SOLN
INTRAVENOUS | Status: AC
  Filled 2021-12-25: qty 50

## 2021-12-25 MED ORDER — SODIUM CHLORIDE 0.9 % IV SOLN
500.0000 mg | INTRAVENOUS | Status: AC
  Administered 2021-12-25 – 2021-12-29 (×5): 500 mg via INTRAVENOUS
  Filled 2021-12-25 (×5): qty 5

## 2021-12-25 NOTE — H&P (Signed)
History and Physical    Michaela Moon ZJQ:[id number] DOB: 09/12/1935 DOA: 12/25/2021  PCP: [name], MD  Patient coming from: Home  I have personally briefly reviewed patient's old medical records in [place]  Chief Complaint: Short of breath and hypoxia  HPI: Michaela Moon is a 86 y.o. female with medical history significant of COPD, diabetes, hypertension, recently diagnosed bilateral lower extremity DVT on anticoagulation, who was unfortunately who has had multiple hospitalizations since September of this year when she was diagnosed with COVID.  She was treated with antivirals and steroids at the time of her diagnosis in September at [facility] and was discharged home with oxygen on 2 L.  Unfortunately, since that time she has had 2 admissions to [facility] with multifocal pneumonia.  She was treated with ceftriaxone and azithromycin on both occasions.  She was also treated with steroids and bronchodilators.  She was discharged home on 4 L of oxygen at the time of discharge.  After her last discharge, she reports that she was initially feeling well and did not have any shortness of breath.  She does have a chronic cough which she feels has not really changed.  Her son noted that she felt unwell last night, but her respiratory status appears to be near baseline.  This morning, he had noted her in bed and checked a pulse ox on her and it was noted to be in the 80s despite wearing 4 L of oxygen.  He proceeded to give her a breathing treatment which transiently improved her oxygen saturations into the 90s, but then subsequently began to drop back down to the 80s.  EMS was called and placed her on nonrebreather mask on their arrival.  Despite this, her oxygen saturations remained in the 80s.  On arrival to the emergency room, she was initially placed on BiPAP but did not tolerate this very well.  She was subsequently transitioned to high flow nasal cannula which appears to  have had a better effect within oxygen saturations in the 90s.  She was profoundly hypoglycemic with blood sugar in the 20s to 30s and received dextrose.  She was also hypothermic.  Hemodynamics are otherwise stable.  CT scan of the chest was repeated and did not show any evidence of pulmonary embolism, but did show persistent multifocal pneumonia.  BNP was checked and noted to be normal.  Currently on 15 L of oxygen via nasal cannula.  She has been referred for admission.   Review of Systems: As per HPI otherwise 10 point review of systems negative.    Past Medical History:  Diagnosis Date   Arthritis    Asthma    COPD (chronic obstructive pulmonary disease) ([place])    Diabetes mellitus without complication (HCC)    GERD (gastroesophageal reflux disease)    Headache    Hyperlipidemia    Hypertension    Macular degeneration    Neuropathy    Obesity    Osteopenia    Stress incontinence     Past Surgical History:  Procedure Laterality Date   BREAST LUMPECTOMY Bilateral    no cancer   CARPAL TUNNEL RELEASE Left 10/23/2017   Procedure: LEFT CARPAL TUNNEL RELEASE;  Surgeon: Carole Civil, MD;  Location: [facility];  Service: Orthopedics;  Laterality: Left;   CARPAL TUNNEL RELEASE Right 11/20/2017   Procedure: CARPAL TUNNEL RELEASE;  Surgeon: Carole Civil, MD;  Location: [facility];  Service: Orthopedics;  Laterality: Right;  CATARACT EXTRACTION W/PHACO Left 11/01/2016   Procedure: CATARACT EXTRACTION PHACO AND INTRAOCULAR LENS PLACEMENT (IOC);  Surgeon: Baruch Goldmann, MD;  Location: [facility];  Service: Ophthalmology;  Laterality: Left;  CDE: 3.83   CATARACT EXTRACTION W/PHACO Right 11/29/2016   Procedure: CATARACT EXTRACTION PHACO AND INTRAOCULAR LENS PLACEMENT RIGHT EYE;  Surgeon: Baruch Goldmann, MD;  Location: [facility];  Service: Ophthalmology;  Laterality: Right;  CDE: 8.71   colon tumor removed  2010   Dr. 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  submucosal lipoma on path   COLONOSCOPY  05/2013   Dr. Anthony Sar: normal. (h/o colon polyps)   COLONOSCOPY  2011   normal   COLONOSCOPY  10/2003   Dr. Lindalou Hose: large polypoid mass hepatic fluexure   ESOPHAGOGASTRODUODENOSCOPY N/A 04/29/2016   Procedure: ESOPHAGOGASTRODUODENOSCOPY (EGD);  Surgeon: Danie Binder, MD;  Location: [facility];  Service: Endoscopy;  Laterality: N/A;  9:15am   SAVORY DILATION N/A 04/29/2016   Procedure: SAVORY DILATION;  Surgeon: Danie Binder, MD;  Location: [facility];  Service: Endoscopy;  Laterality: N/A;    Social History:  reports that she quit smoking about 19 years ago. Her smoking use included cigarettes. She has a 40.00 pack-year smoking history. She has never used smokeless tobacco. She reports that she does not drink alcohol and does not use drugs.  Allergies  Allergen Reactions   Onion     wheezing    Family History  Problem Relation Age of Onset   Melanoma Brother    Colon cancer Neg Hx      Prior to Admission medications   Medication Sig Start Date End Date Taking? Authorizing Provider  albuterol (PROVENTIL) (2.5 MG/3ML) 0.083% nebulizer solution Take 3 mLs (2.5 mg total) by nebulization every 6 (six) hours as needed for wheezing or shortness of breath. 02/14/21  Yes Chesley Mires, MD  albuterol-ipratropium (COMBIVENT) 18-103 MCG/ACT inhaler Inhale 2 puffs into the lungs every 6 (six) hours as needed for wheezing. 11/27/12  Yes Vernie Shanks, MD  apixaban (ELIQUIS) 5 MG TABS tablet Take 1 tablet (5 mg total) by mouth 2 (two) times daily. 12/20/21  Yes Johnson, Clanford L, MD  aspirin 81 MG tablet Take 81 mg by mouth daily.   Yes [provider]  atorvastatin (LIPITOR) 80 MG tablet ALTERNATE TAKING 1 TABLET EVERY OTHER DAY WITH 1/2 TABLET EVERY OTHERDAY 01/13/14  Yes Chipper Herb, MD  Biotin w/ Vitamins C & E (HAIR/SKIN/NAILS PO) Take 1 tablet by mouth daily. Takes once a day.   Yes [provider]   Budeson-Glycopyrrol-Formoterol (BREZTRI AEROSPHERE) 160-9-4.8 MCG/ACT AERO Inhale 2 puffs into the lungs in the morning and at bedtime. 03/08/21  Yes Chesley Mires, MD  docusate sodium (COLACE) 100 MG capsule Take 1 capsule (100 mg total) by mouth daily. 12/04/21 12/04/22 Yes Shah, Pratik D, DO  ferrous sulfate 325 (65 FE) MG tablet Take 1 tablet (325 mg total) by mouth daily. 12/04/21 12/04/22 Yes Shah, Pratik D, DO  fluticasone (FLONASE) 50 MCG/ACT nasal spray Place 1 spray into both nostrils daily. 10/19/20  Yes Chesley Mires, MD  gabapentin (NEURONTIN) 100 MG capsule Take 1 capsule (100 mg total) by mouth 3 (three) times daily. 12/20/21  Yes Johnson, Clanford L, MD  glimepiride (AMARYL) 2 MG tablet Take 4 mg by mouth daily with breakfast.    Yes [provider]  guaiFENesin (MUCINEX) 600 MG 12 hr tablet Take 2 tablets (1,200 mg total) by mouth 2 (  two) times daily as needed for cough or to loosen phlegm. 02/14/21  Yes Chesley Mires, MD  Hypromellose (ARTIFICIAL TEARS OP) Place 1 drop into both eyes 4 (four) times daily.   Yes [provider]  LANTUS SOLOSTAR 100 UNIT/ML Solostar Pen Inject 20 Units into the skin in the morning and at bedtime. 11/22/20  Yes [provider]  losartan-hydrochlorothiazide (HYZAAR) 100-12.5 MG per tablet TAKE ONE (1) TABLET EACH DAY Patient taking differently: Take 1 tablet by mouth daily. 03/09/14  Yes Chipper Herb, MD  metoprolol (LOPRESSOR) 50 MG tablet Take 50 mg by mouth 2 (two) times daily.   Yes [provider]  montelukast (SINGULAIR) 10 MG tablet TAKE ONE TABLET BY MOUTH AT BEDTIME 07/30/21  Yes Chesley Mires, MD  Multiple Vitamins-Minerals (CENTRUM SILVER PO) Take 1 tablet by mouth daily.   Yes [provider]  Omega-3 Fatty Acids (FISH OIL) 1000 MG CAPS Take 1,000 mg by mouth in the morning, at noon, and at bedtime.   Yes [provider]  oxybutynin (DITROPAN) 5 MG tablet Take 5 mg by mouth every 8 (eight) hours  as needed for bladder spasms.    Yes [provider]  pantoprazole (PROTONIX) 40 MG tablet Take 40 mg by mouth daily. 10/23/21  Yes [provider]  predniSONE (DELTASONE) 20 MG tablet Take 3 PO QAM x7days, 2 PO QAM x7days, 1 PO QAM x7days Patient taking differently: Take 20 mg by mouth See admin instructions. Take 3 PO QAM x7days, 2 PO QAM x7days, 1 PO QAM x7days 12/21/21  Yes Johnson, Clanford L, MD  sitaGLIPtin-metformin (JANUMET) 50-1000 MG tablet Take 1 tablet by mouth 2 (two) times daily with a meal.   Yes [provider]  triamcinolone cream (KENALOG) 0.1 % Apply 1 application topically daily as needed (eczema).   Yes [provider]  polyethylene glycol (MIRALAX / GLYCOLAX) 17 g packet Take 17 g by mouth daily as needed for mild constipation. 12/20/21   Murlean Iba, MD    Physical Exam: Vitals:   12/25/21 1045 12/25/21 1144 12/25/21 1230 12/25/21 1250  BP: (!) 153/53  (!) 130/54 (!) 131/49  Pulse: 79  72 76  Resp: '16  15 15  '$ Temp:  97.7 F (36.5 C)  98.6 F (37 C)  TempSrc:  Oral  Oral  SpO2: 94%  92% 93%  Weight:      Height:        Constitutional: NAD, calm, comfortable Eyes: PERRL, lids and conjunctivae normal ENMT: Mucous membranes are moist. Posterior pharynx clear of any exudate or lesions.Normal dentition.  Neck: normal, supple, no masses, no thyromegaly Respiratory: Bilateral wheezing and rhonchi. Normal respiratory effort. No accessory muscle use.  Cardiovascular: Regular rate and rhythm, no murmurs / rubs / gallops. No extremity edema. 2+ pedal pulses. No carotid bruits.  Abdomen: no tenderness, no masses palpated. No hepatosplenomegaly. Bowel sounds positive.  Musculoskeletal: no clubbing / cyanosis. No joint deformity upper and lower extremities. Good ROM, no contractures. Normal muscle tone.  Skin: no rashes, lesions, ulcers. No induration Neurologic: CN 2-12 grossly intact. Sensation intact, DTR normal. Strength 5/5 in  all 4.  Psychiatric: Normal judgment and insight. Alert and oriented x 3. Normal mood.    Labs on Admission: I have personally reviewed following labs and imaging studies  CBC: Recent Labs  Lab 12/19/21 0436 12/25/21 0823  WBC 16.3* 25.4*  NEUTROABS  --  22.8*  HGB 9.6* 10.5*  HCT 30.8* 34.0*  MCV 84.6  86.7  PLT 166 676   Basic Metabolic Panel: Recent Labs  Lab 12/19/21 0436 12/25/21 0823  NA 136 138  K 5.1 3.4*  CL 101 106  CO2 29 27  GLUCOSE 126* 30*  BUN 42* 33*  CREATININE 0.68 0.54  CALCIUM 10.3 10.5*  MG 2.1  --    GFR: Estimated Creatinine Clearance: 55 mL/min (by C-G formula based on SCr of 0.54 mg/dL). Liver Function Tests: No results for input(s): "AST", "ALT", "ALKPHOS", "BILITOT", "PROT", "ALBUMIN" in the last 168 hours. No results for input(s): "LIPASE", "AMYLASE" in the last 168 hours. No results for input(s): "AMMONIA" in the last 168 hours. Coagulation Profile: No results for input(s): "INR", "PROTIME" in the last 168 hours. Cardiac Enzymes: No results for input(s): "CKTOTAL", "CKMB", "CKMBINDEX", "TROPONINI" in the last 168 hours. BNP (last 3 results) No results for input(s): "PROBNP" in the last 8760 hours. HbA1C: No results for input(s): "HGBA1C" in the last 72 hours. CBG: Recent Labs  Lab 12/20/21 0731 12/20/21 1143 12/25/21 0924 12/25/21 0959 12/25/21 1157  GLUCAP 111* 318* 23* 144* 126*   Lipid Profile: No results for input(s): "CHOL", "HDL", "LDLCALC", "TRIG", "CHOLHDL", "LDLDIRECT" in the last 72 hours. Thyroid Function Tests: No results for input(s): "TSH", "T4TOTAL", "FREET4", "T3FREE", "THYROIDAB" in the last 72 hours. Anemia Panel: No results for input(s): "VITAMINB12", "FOLATE", "FERRITIN", "TIBC", "IRON", "RETICCTPCT" in the last 72 hours. Urine analysis: No results found for: "COLORURINE", "APPEARANCEUR", "LABSPEC", "PHURINE", "GLUCOSEU", "HGBUR", "BILIRUBINUR", "KETONESUR", "PROTEINUR", "UROBILINOGEN", "NITRITE",  "LEUKOCYTESUR"  Radiological Exams on Admission: CT Angio Chest PE W and/or Wo Contrast  Result Date: 12/25/2021 CLINICAL DATA:  High clinical suspicion of pulmonary embolism, hypoxemia EXAM: CT ANGIOGRAPHY CHEST WITH CONTRAST TECHNIQUE: Multidetector CT imaging of the chest was performed using the standard protocol during bolus administration of intravenous contrast. Multiplanar CT image reconstructions and MIPs were obtained to evaluate the vascular anatomy. RADIATION DOSE REDUCTION: This exam was performed according to the departmental dose-optimization program which includes automated exposure control, adjustment of the mA and/or kV according to patient size and/or use of iterative reconstruction technique. CONTRAST:  65m OMNIPAQUE IOHEXOL 350 MG/ML SOLN COMPARISON:  12/08/2021 FINDINGS: Cardiovascular: Atherosclerotic calcifications aorta and coronary arteries. Aorta normal caliber without aneurysm or dissection. No pericardial effusion. Pulmonary arteries well opacified and patent. No evidence of pulmonary embolism. Mediastinum/Nodes: Esophagus unremarkable. Base of cervical region normal appearance. No thoracic adenopathy. Lungs/Pleura: Extensive BILATERAL airspace infiltrates consistent with multifocal pneumonia. No pleural effusion or pneumothorax. No discrete mass. Upper Abdomen: Small hepatic LEFT lobe and RIGHT renal cysts, simple features; no follow-up imaging recommended. Remaining visualized upper abdomen unremarkable. Musculoskeletal: No acute osseous findings. Review of the MIP images confirms the above findings. IMPRESSION: No evidence of pulmonary embolism. Extensive BILATERAL airspace infiltrates consistent with multifocal pneumonia. Scattered atherosclerotic calcifications including coronary arteries. Aortic Atherosclerosis (ICD10-I70.0). Electronically Signed   By: MLavonia DanaM.D.   On: 12/25/2021 11:00   DG Chest Port 1 View  Result Date: 12/25/2021 CLINICAL DATA:  Shortness of  breath EXAM: PORTABLE CHEST 1 VIEW COMPARISON:  Previous studies including the examination of 12/13/2021 FINDINGS: Transverse diameter of heart is increased. Central pulmonary vessels are prominent. There are patchy alveolar densities in both parahilar regions and lower lung fields with interval worsening. Lateral costophrenic angles are indistinct. There is no pneumothorax. IMPRESSION: Cardiomegaly. Central pulmonary vessels are prominent. There is interval worsening of alveolar densities in parahilar regions and lower lung fields, especially in right lower lung field. Findings suggest pulmonary edema or  multifocal pneumonia with interval worsening. Small bilateral pleural effusions. Electronically Signed   By: Elmer Picker M.D.   On: 12/25/2021 09:15    EKG: Independently reviewed.  Sinus rhythm without acute ischemic changes  Assessment/Plan Principal Problem:   Multifocal pneumonia Active Problems:   Dysphagia   Mixed hyperlipidemia   Peripheral neuropathy   Acute exacerbation of chronic obstructive pulmonary disease (COPD) (HCC)   GERD (gastroesophageal reflux disease)   Recurrent pneumonia   Acute on chronic respiratory failure with hypoxia (HCC)   Hypoglycemia   Insulin dependent type 2 diabetes mellitus ([place])   Hypothermia     Acute on chronic respiratory failure with hypoxia -More recently, she has been on 4 L of oxygen at home -Current requiring up to 15 L -Likely combination of COPD and pneumonia -Continue to wean down oxygen as tolerated -Patient normally follows with pulmonology, Dr. Halford Chessman -We will request pulmonology consultation  Recurrent multifocal pneumonia -Treat with broad-spectrum antibiotics including vancomycin and cefepime -May have a component of dysphagia that could be contributing to recurrent aspiration -He was seen by speech therapy during her last admission underwent modified barium study.  This showed that patient did not have any real  oropharyngeal dysphagia leading to significant aspiration -She does have a history of previous esophageal issues and had undergone dilatation in the past -She may benefit from esophagram to further evaluate esophagus to see if she needs recurrent dilatation, once her respiratory status stabilizes -Continue pulmonary hygiene  Hypoglycemia Hypothermia -Likely related to decreased p.o. intake while she continues to take her diabetes medications -Hold further medications for diabetes and follow blood sugars -Monitor on sliding scale only -Currently on normal lactate, but suspect that hypothermia should resolve as hypoglycemia stabilizes  COPD -She has wheezing and rhonchi at this time -Continue bronchodilators, inhaled steroids -We will start on intravenous Solu-Medrol -Of note, she was discharged with prednisone taper which is likely contributing to her current leukocytosis  Insulin-dependent type 2 diabetes mellitus -Holding diabetes meds for now while she is hypoglycemic -Monitor on sliding scale  GERD -Continue PPI  Peripheral neuropathy -Continue on gabapentin  Hyperlipidemia -Continue statin   DVT prophylaxis: Apixaban Code Status: DNR, confirmed with patient Family Communication: Son is at the bedside Disposition Plan: Pending hospital course, probably discharge home once improved follow may need placement Consults called: Pulmonology Admission status: Inpatient, stepdown  Kathie Dike MD [facility]   If 7PM-7AM, please contact night-coverage [email or web address]   12/25/2021, 12:54 PM

## 2021-12-25 NOTE — ED Triage Notes (Signed)
Pt brought in by [facility] for c/o respiratory distress; ems arrived and found pt to be 80% on NRB with first responders  Pt has bilateral rales  Pt given one nitro en route by ems

## 2021-12-25 NOTE — ED Provider Notes (Signed)
[facility] EMERGENCY DEPARTMENT Provider Note   CSN: [id number] Arrival date & time: 12/25/21  0962     History  Chief Complaint  Patient presents with   Respiratory Distress    Michaela Moon is a 86 y.o. female.  Pt is a 86 yo female with a pmhx significant for HTN, HLD, DM, arthritis, obesity, COPD, GERD, and recent resp issues from Covid.  Pt admitted from 9/2-9/12 to [facility] for resp failure due to Covid for which she was treated with Paxlovid.  She was d/c with oxygen at 2L.  She was readmitted here from 10/4-10/10 for worsening resp distress and was treated for multifocal pna with rocephin and zithromax. She required admission here again from 10/13-10/26 for resp failure again.  She required HFNC at 30L which was weaned down to 3-5L oxygen at d/c.  Pt was treated with rocephin and zithromax again and was treated with duonebs, mucinex, solumedrol, and singulair.  She did develop BLE DVT and was put on Eliquis.  CT showed no PE.  Today, pt called EMS due to worsening sob.  She is not a great historian, so it is unclear when sx started.  When EMS arrived, she had an O2 sat of 80% on a NRB.  She was put on bipap and O2 sat only in the mid-80s.  Pt was given 1 nitro for CP by EMS and a small amt of fluid as her bp was in the 90s.    [name] said she has not had much of an appetite since discharge.        Home Medications Prior to Admission medications   Medication Sig Start Date End Date Taking? Authorizing Provider  albuterol (PROVENTIL) (2.5 MG/3ML) 0.083% nebulizer solution Take 3 mLs (2.5 mg total) by nebulization every 6 (six) hours as needed for wheezing or shortness of breath. 02/14/21  Yes Chesley Mires, MD  albuterol-ipratropium (COMBIVENT) 18-103 MCG/ACT inhaler Inhale 2 puffs into the lungs every 6 (six) hours as needed for wheezing. 11/27/12  Yes Vernie Shanks, MD  apixaban (ELIQUIS) 5 MG TABS tablet Take 1 tablet (5 mg total) by mouth 2 (two) times  daily. 12/20/21  Yes Johnson, Clanford L, MD  aspirin 81 MG tablet Take 81 mg by mouth daily.   Yes [provider]  atorvastatin (LIPITOR) 80 MG tablet ALTERNATE TAKING 1 TABLET EVERY OTHER DAY WITH 1/2 TABLET EVERY OTHERDAY 01/13/14  Yes Chipper Herb, MD  Biotin w/ Vitamins C & E (HAIR/SKIN/NAILS PO) Take 1 tablet by mouth daily. Takes once a day.   Yes [provider]  Budeson-Glycopyrrol-Formoterol (BREZTRI AEROSPHERE) 160-9-4.8 MCG/ACT AERO Inhale 2 puffs into the lungs in the morning and at bedtime. 03/08/21  Yes Chesley Mires, MD  docusate sodium (COLACE) 100 MG capsule Take 1 capsule (100 mg total) by mouth daily. 12/04/21 12/04/22 Yes Shah, Pratik D, DO  ferrous sulfate 325 (65 FE) MG tablet Take 1 tablet (325 mg total) by mouth daily. 12/04/21 12/04/22 Yes Shah, Pratik D, DO  fluticasone (FLONASE) 50 MCG/ACT nasal spray Place 1 spray into both nostrils daily. 10/19/20  Yes Chesley Mires, MD  gabapentin (NEURONTIN) 100 MG capsule Take 1 capsule (100 mg total) by mouth 3 (three) times daily. 12/20/21  Yes Johnson, Clanford L, MD  glimepiride (AMARYL) 2 MG tablet Take 4 mg by mouth daily with breakfast.    Yes [provider]  guaiFENesin (MUCINEX) 600 MG 12 hr tablet Take 2 tablets (1,200 mg total) by  mouth 2 (two) times daily as needed for cough or to loosen phlegm. 02/14/21  Yes Chesley Mires, MD  Hypromellose (ARTIFICIAL TEARS OP) Place 1 drop into both eyes 4 (four) times daily.   Yes [provider]  LANTUS SOLOSTAR 100 UNIT/ML Solostar Pen Inject 20 Units into the skin in the morning and at bedtime. 11/22/20   [provider]  losartan-hydrochlorothiazide (HYZAAR) 100-12.5 MG per tablet TAKE ONE (1) TABLET EACH DAY Patient taking differently: Take 1 tablet by mouth daily. 03/09/14   Chipper Herb, MD  metoprolol (LOPRESSOR) 50 MG tablet Take 50 mg by mouth 2 (two) times daily.    [provider]  montelukast (SINGULAIR) 10 MG tablet TAKE  ONE TABLET BY MOUTH AT BEDTIME 07/30/21   Chesley Mires, MD  Multiple Vitamins-Minerals (CENTRUM SILVER PO) Take 1 tablet by mouth daily.    [provider]  Omega-3 Fatty Acids (FISH OIL) 1000 MG CAPS Take 1,000 mg by mouth in the morning, at noon, and at bedtime.    [provider]  oxybutynin (DITROPAN) 5 MG tablet Take 5 mg by mouth every 8 (eight) hours as needed for bladder spasms.     [provider]  pantoprazole (PROTONIX) 40 MG tablet Take 40 mg by mouth daily. 10/23/21   [provider]  polyethylene glycol (MIRALAX / GLYCOLAX) 17 g packet Take 17 g by mouth daily as needed for mild constipation. 12/20/21   Johnson, Clanford L, MD  predniSONE (DELTASONE) 20 MG tablet Take 3 PO QAM x7days, 2 PO QAM x7days, 1 PO QAM x7days 12/21/21   Johnson, Clanford L, MD  sitaGLIPtin-metformin (JANUMET) 50-1000 MG tablet Take 1 tablet by mouth 2 (two) times daily with a meal.    [provider]  triamcinolone cream (KENALOG) 0.1 % Apply 1 application topically daily as needed (eczema).    [provider]      Allergies    Onion    Review of Systems   Review of Systems  Respiratory:  Positive for shortness of breath.   All other systems reviewed and are negative.   Physical Exam Updated Vital Signs BP (!) 153/53   Pulse 79   Temp (!) 95.5 F (35.3 C) (Rectal)   Resp 16   Ht '5\' 5"'$  (1.651 m)   Wt 80.7 kg   SpO2 94%   BMI 29.61 kg/m  Physical Exam Vitals and nursing note reviewed.  Constitutional:      General: She is in acute distress.     Appearance: She is obese. She is ill-appearing.  HENT:     Head: Normocephalic and atraumatic.     Right Ear: External ear normal.     Left Ear: External ear normal.     Nose: Nose normal.     Mouth/Throat:     Mouth: Mucous membranes are dry.  Eyes:     Extraocular Movements: Extraocular movements intact.     Conjunctiva/sclera: Conjunctivae normal.     Pupils: Pupils are equal, round, and  reactive to light.  Cardiovascular:     Rate and Rhythm: Normal rate and regular rhythm.     Pulses: Normal pulses.     Heart sounds: Normal heart sounds.  Pulmonary:     Effort: Tachypnea and respiratory distress present.     Breath sounds: Rhonchi present.  Abdominal:     General: Abdomen is flat. Bowel sounds are normal.     Palpations: Abdomen is soft.  Musculoskeletal:  General: Normal range of motion.     Cervical back: Normal range of motion and neck supple.  Skin:    General: Skin is warm.     Capillary Refill: Capillary refill takes less than 2 seconds.  Neurological:     General: No focal deficit present.     Mental Status: She is alert and oriented to person, place, and time.  Psychiatric:        Mood and Affect: Mood normal.        Behavior: Behavior normal.     ED Results / Procedures / Treatments   Labs (all labs ordered are listed, but only abnormal results are displayed) Labs Reviewed  BASIC METABOLIC PANEL - Abnormal; Notable for the following components:      Result Value   Potassium 3.4 (*)    Glucose, Bld 30 (*)    BUN 33 (*)    Calcium 10.5 (*)    All other components within normal limits  CBC WITH DIFFERENTIAL/PLATELET - Abnormal; Notable for the following components:   WBC 25.4 (*)    Hemoglobin 10.5 (*)    HCT 34.0 (*)    RDW 27.8 (*)    Neutro Abs 22.8 (*)    Abs Immature Granulocytes 0.18 (*)    All other components within normal limits  BLOOD GAS, ARTERIAL - Abnormal; Notable for the following components:   pO2, Arterial 66 (*)    Acid-base deficit 3.1 (*)    All other components within normal limits  LACTIC ACID, PLASMA - Abnormal; Notable for the following components:   Lactic Acid, Venous 2.3 (*)    All other components within normal limits  CBG MONITORING, ED - Abnormal; Notable for the following components:   Glucose-Capillary 23 (*)    All other components within normal limits  CBG MONITORING, ED - Abnormal; Notable for the  following components:   Glucose-Capillary 144 (*)    All other components within normal limits  CULTURE, BLOOD (ROUTINE X 2)  CULTURE, BLOOD (ROUTINE X 2)  SARS CORONAVIRUS 2 BY RT PCR  BRAIN NATRIURETIC PEPTIDE  LACTIC ACID, PLASMA  PROCALCITONIN  CBG MONITORING, ED  CBG MONITORING, ED    EKG EKG Interpretation  Date/Time:  Tuesday December 25 2021 08:17:46 EDT Ventricular Rate:  64 PR Interval:  147 QRS Duration: 106 QT Interval:  413 QTC Calculation: 427 R Axis:   51 Text Interpretation: Sinus rhythm Since last tracing rate slower Confirmed by Isla Pence [phone]) on 12/25/2021 8:40:54 AM  Radiology CT Angio Chest PE W and/or Wo Contrast  Result Date: 12/25/2021 CLINICAL DATA:  High clinical suspicion of pulmonary embolism, hypoxemia EXAM: CT ANGIOGRAPHY CHEST WITH CONTRAST TECHNIQUE: Multidetector CT imaging of the chest was performed using the standard protocol during bolus administration of intravenous contrast. Multiplanar CT image reconstructions and MIPs were obtained to evaluate the vascular anatomy. RADIATION DOSE REDUCTION: This exam was performed according to the departmental dose-optimization program which includes automated exposure control, adjustment of the mA and/or kV according to patient size and/or use of iterative reconstruction technique. CONTRAST:  29m OMNIPAQUE IOHEXOL 350 MG/ML SOLN COMPARISON:  12/08/2021 FINDINGS: Cardiovascular: Atherosclerotic calcifications aorta and coronary arteries. Aorta normal caliber without aneurysm or dissection. No pericardial effusion. Pulmonary arteries well opacified and patent. No evidence of pulmonary embolism. Mediastinum/Nodes: Esophagus unremarkable. Base of cervical region normal appearance. No thoracic adenopathy. Lungs/Pleura: Extensive BILATERAL airspace infiltrates consistent with multifocal pneumonia. No pleural effusion or pneumothorax. No discrete mass. Upper Abdomen: Small hepatic LEFT  lobe and RIGHT renal  cysts, simple features; no follow-up imaging recommended. Remaining visualized upper abdomen unremarkable. Musculoskeletal: No acute osseous findings. Review of the MIP images confirms the above findings. IMPRESSION: No evidence of pulmonary embolism. Extensive BILATERAL airspace infiltrates consistent with multifocal pneumonia. Scattered atherosclerotic calcifications including coronary arteries. Aortic Atherosclerosis (ICD10-I70.0). Electronically Signed   By: Lavonia Dana M.D.   On: 12/25/2021 11:00   DG Chest Port 1 View  Result Date: 12/25/2021 CLINICAL DATA:  Shortness of breath EXAM: PORTABLE CHEST 1 VIEW COMPARISON:  Previous studies including the examination of 12/13/2021 FINDINGS: Transverse diameter of heart is increased. Central pulmonary vessels are prominent. There are patchy alveolar densities in both parahilar regions and lower lung fields with interval worsening. Lateral costophrenic angles are indistinct. There is no pneumothorax. IMPRESSION: Cardiomegaly. Central pulmonary vessels are prominent. There is interval worsening of alveolar densities in parahilar regions and lower lung fields, especially in right lower lung field. Findings suggest pulmonary edema or multifocal pneumonia with interval worsening. Small bilateral pleural effusions. Electronically Signed   By: Elmer Picker M.D.   On: 12/25/2021 09:15    Procedures Procedures    Medications Ordered in ED Medications  ceFEPIme (MAXIPIME) 2 g in sodium chloride 0.9 % 100 mL IVPB (has no administration in time range)  vancomycin (VANCOREADY) IVPB 1500 mg/300 mL (has no administration in time range)  vancomycin (VANCOREADY) IVPB 1250 mg/250 mL (has no administration in time range)  methylPREDNISolone sodium succinate (SOLU-MEDROL) 125 mg/2 mL injection 125 mg (125 mg Intravenous Given 12/25/21 0905)  ipratropium-albuterol (DUONEB) 0.5-2.5 (3) MG/3ML nebulizer solution 3 mL (3 mLs Nebulization Given 12/25/21 0904)   furosemide (LASIX) injection 40 mg (40 mg Intravenous Given 12/25/21 1001)  dextrose 50 % solution 50 mL (50 mLs Intravenous Given 12/25/21 0927)  iohexol (OMNIPAQUE) 350 MG/ML injection 100 mL (75 mLs Intravenous Contrast Given 12/25/21 1042)    ED Course/ Medical Decision Making/ A&P                           Medical Decision Making Amount and/or Complexity of Data Reviewed Labs: ordered. Radiology: ordered.  Risk Prescription drug management. Decision regarding hospitalization.   This patient presents to the ED for concern of sob, this involves an extensive number of treatment options, and is a complaint that carries with it a high risk of complications and morbidity.  The differential diagnosis includes chf, copd, pna, pe   Co morbidities that complicate the patient evaluation  HTN, HLD, DM, arthritis, obesity, COPD, GERD   Additional history obtained:  Additional history obtained from epic chart review External records from outside source obtained and reviewed including EMS report/family   Lab Tests:  I Ordered, and personally interpreted labs.  The pertinent results include:  cbc with wbc elevated at 25.4; covid neg; bmp with glucose low at 30; pro cal <0.10, abg with ph 7.38, pO2 66 (on 15L HF O2); pco2 nl at 36; initial lactic 1.2, but it went up to 2.3   Imaging Studies ordered:  I ordered imaging studies including cxr and CT chest I independently visualized and interpreted imaging which showed  CXR IMPRESSION:  Cardiomegaly. Central pulmonary vessels are prominent. There is  interval worsening of alveolar densities in parahilar regions and  lower lung fields, especially in right lower lung field. Findings  suggest pulmonary edema or multifocal pneumonia with interval  worsening. Small bilateral pleural effusions.  CT chest: IMPRESSION:  No evidence of  pulmonary embolism.    Extensive BILATERAL airspace infiltrates consistent with multifocal  pneumonia.     Scattered atherosclerotic calcifications including coronary  arteries.    Aortic Atherosclerosis (ICD10-I70.0).   I agree with the radiologist interpretation   Cardiac Monitoring:  The patient was maintained on a cardiac monitor.  I personally viewed and interpreted the cardiac monitored which showed an underlying rhythm of: nsr   Medicines ordered and prescription drug management:  I ordered medication including solumedrol, duoneb, lasix  for sob  Reevaluation of the patient after these medicines showed that the patient improved I have reviewed the patients home medicines and have made adjustments as needed   Test Considered:  ct   Critical Interventions:  HF O2   Consultations Obtained:  I requested consultation with the hospitalist (Dr. Roderic Palau),  and discussed lab and imaging findings as well as pertinent plan - he will admit   Problem List / ED Course:  Hypoxic resp failure:  pt requiring HF (15L) O2 to keep sats over 90.  Cause is likely multifactorial. She is given solumedrol and duoneb for copd.  She is given lasix for chf.  She has no evidence of PE on CT, but CT shows multifocal pna.  She has been treated with rocephin and zithromax twice, so I am starting her on vanc and maxipime Hypoglycemia:  pt given juice and 1 amp D50.  Likely due to not eating and continuing to take insulin.   Reevaluation:  After the interventions noted above, I reevaluated the patient and found that they have :improved   Social Determinants of Health:  Lives at home  Dispostion:  After consideration of the diagnostic results and the patients response to treatment, I feel that the patent would benefit from admission.    CRITICAL CARE Performed by: Isla Pence   Total critical care time: 30 minutes  Critical care time was exclusive of separately billable procedures and treating other patients.  Critical care was necessary to treat or prevent imminent or  life-threatening deterioration.  Critical care was time spent personally by me on the following activities: development of treatment plan with patient and/or surrogate as well as nursing, discussions with consultants, evaluation of patient's response to treatment, examination of patient, obtaining history from patient or surrogate, ordering and performing treatments and interventions, ordering and review of laboratory studies, ordering and review of radiographic studies, pulse oximetry and re-evaluation of patient's condition.         Final Clinical Impression(s) / ED Diagnoses Final diagnoses:  Acute respiratory failure with hypoxia ([place])  Multifocal pneumonia  Hypoglycemia    Rx / DC Orders ED Discharge Orders     None         Isla Pence, MD 12/25/21 1142

## 2021-12-25 NOTE — Consult Note (Signed)
NAME:  Michaela Moon, MRN:  [id number], DOB:  [birth year], LOS: 0 ADMISSION DATE:  12/25/2021, CONSULTATION DATE:  12/25/2021  REFERRING MD:  Gwynneth Albright, CHIEF COMPLAINT: Respiratory distress  History of Present Illness:  This is her third admission in the past 2 months for this 86 year old exsmoker with multifocal pneumonia and acute hypoxic respiratory failure. she was hospitalized 9/2 to 9/12 and treated for COVID-19 pneumonia With Paxlovid, Decadron and empiric antibiotic discharge on 2 L She was again hospitalized 10/4 to 10/10 with multifocal pneumonia and treated with Rocephin and azithromycin, weaned down to 5 L nasal cannula  She will get admitted 10/13 to 10/26 for Bilateral infiltrates and severe hypoxia requiring heated high flow nasal cannula-lingular patchy infiltrate dates back to January 2023 and both lower lobe infiltrates appear gradually worse compared to July 2023 on serial CT imaging   -Differential includes chronic aspiration versus inflammatory etiology, doubt malignancy.  Right upper lobe pulmonary nodule appears stable since January 2023  She was treated with high-dose IV Solu-Medrol and diuresis and improved gradually to 5 L nasal cannula on discharge .  Repeat swallow evaluation only showed mild aspiration risk She was discharged on 40 mg of prednisone with a plan to taper by 10 mg every week  She is again admitted for respiratory distress and hypoxia, required BiPAP in the emergency room, transition to high flow nasal cannula. Chest x-ray and CT chest again confirmed bilateral multifocal pneumonia  Pertinent  Medical History  hypertension,  T2DM,  dysphagia requiring dilation 04/2016 for ? web COPD and chronic respiratory failure on 2 L of oxygen   Significant Hospital Events: Including procedures, antibiotic start and stop dates in addition to other pertinent events   CT angiogram chest 12/08/2021 multilobar airspace disease with similar pattern compared to  January 2023.  Lingular opacity appears chronic, lower lobe airspace disease is worse, right upper lobe spiculated nodule unchanged from prior   10/24 swallow evaluation dysphagia 3 diet mild aspiration risk  Interim History / Subjective:  On high flow nasal cannula Afebrile  Objective   Blood pressure (!) 117/42, pulse 70, temperature 98.2 F (36.8 C), temperature source Oral, resp. rate 12, height '5\' 5"'$  (1.651 m), weight 79 kg, SpO2 92 %.        Intake/Output Summary (Last 24 hours) at 12/25/2021 1619 Last data filed at 12/25/2021 1541 Gross per 24 hour  Intake 648.95 ml  Output 1000 ml  Net -351.05 ml   Filed Weights   12/25/21 0817 12/25/21 1300  Weight: 80.7 kg 79 kg    Examination: General: Elderly woman sitting up in bed no distress HENT: Mild pallor, no JVD, no icterus Lungs: Bilateral diffuse crackles, mild accessory muscle use Cardiovascular: S1-S2 irregular Abdomen: Soft, nontender no organomegaly Extremities: No edema, no deformity Neuro: Alert, interactive, nonfocal   Lactate 2.3, ABG does not show hypercarbia Labs show mild hypokalemia, negative procalcitonin, leukocytosis  Resolved Hospital Problem list     Assessment & Plan:  Acute on chronic hypoxic respiratory failure Multi focal pneumonia -Since she was discharged on prednisone, inflammatory etiology seems less likely.  This is more likely to be recurrent aspiration even though she cleared her swallow evaluation prior to last discharge  -Would keep her n.p.o. -Continue high flow nasal cannula , if hypoxia worsens she may need heated high flow -Continue empiric Solu-Medrol 40 every 12 -Tracheobronchial toilet with flutter valve, vest and Mucinex  Best Practice (right click and "Reselect all SmartList Selections" daily)   Diet/type:  NPO DVT prophylaxis: DOAC GI prophylaxis: PPI Lines: N/A Foley:  N/A Code Status:  DNR Last date of multidisciplinary goals of care discussion [Per [facility]]  Labs    CBC: Recent Labs  Lab 12/19/21 0436 12/25/21 0823  WBC 16.3* 25.4*  NEUTROABS  --  22.8*  HGB 9.6* 10.5*  HCT 30.8* 34.0*  MCV 84.6 86.7  PLT 166 397    Basic Metabolic Panel: Recent Labs  Lab 12/19/21 0436 12/25/21 0823  NA 136 138  K 5.1 3.4*  CL 101 106  CO2 29 27  GLUCOSE 126* 30*  BUN 42* 33*  CREATININE 0.68 0.54  CALCIUM 10.3 10.5*  MG 2.1  --    GFR: Estimated Creatinine Clearance: 54.4 mL/min (by C-G formula based on SCr of 0.54 mg/dL). Recent Labs  Lab 12/19/21 0436 12/25/21 0823 12/25/21 1033  PROCALCITON  --  <0.10  --   WBC 16.3* 25.4*  --   LATICACIDVEN  --  1.2 2.3*    Liver Function Tests: No results for input(s): "AST", "ALT", "ALKPHOS", "BILITOT", "PROT", "ALBUMIN" in the last 168 hours. No results for input(s): "LIPASE", "AMYLASE" in the last 168 hours. No results for input(s): "AMMONIA" in the last 168 hours.  ABG    Component Value Date/Time   PHART 7.38 12/25/2021 0943   PCO2ART 36 12/25/2021 0943   PO2ART 66 (L) 12/25/2021 0943   HCO3 22.0 12/25/2021 0943   ACIDBASEDEF 3.1 (H) 12/25/2021 0943   O2SAT 96 12/25/2021 0943     Coagulation Profile: No results for input(s): "INR", "PROTIME" in the last 168 hours.  Cardiac Enzymes: No results for input(s): "CKTOTAL", "CKMB", "CKMBINDEX", "TROPONINI" in the last 168 hours.  HbA1C: Hgb A1c MFr Bld  Date/Time Value Ref Range Status  12/08/2021 04:46 AM 6.3 (H) 4.8 - 5.6 % Final    Comment:    (NOTE) Pre diabetes:          5.7%-6.4%  Diabetes:              >6.4%  Glycemic control for   <7.0% adults with diabetes   10/21/2017 11:18 AM 7.5 (H) 4.8 - 5.6 % Final    Comment:    (NOTE) Pre diabetes:          5.7%-6.4% Diabetes:              >6.4% Glycemic control for   <7.0% adults with diabetes     CBG: Recent Labs  Lab 12/20/21 0731 12/20/21 1143 12/25/21 0924 12/25/21 0959 12/25/21 1157  GLUCAP 111* 318* 23* 144* 126*    Review of Systems:   Unable to obtain  due to respiratory distress  Past Medical History:  She,  has a past medical history of Arthritis, Asthma, COPD (chronic obstructive pulmonary disease) ([place]), Diabetes mellitus without complication ([place]), GERD (gastroesophageal reflux disease), Headache, Hyperlipidemia, Hypertension, Macular degeneration, Neuropathy, Obesity, Osteopenia, and Stress incontinence.   Surgical History:   Past Surgical History:  Procedure Laterality Date   BREAST LUMPECTOMY Bilateral    no cancer   CARPAL TUNNEL RELEASE Left 10/23/2017   Procedure: LEFT CARPAL TUNNEL RELEASE;  Surgeon: Carole Civil, MD;  Location: [facility];  Service: Orthopedics;  Laterality: Left;   CARPAL TUNNEL RELEASE Right 11/20/2017   Procedure: CARPAL TUNNEL RELEASE;  Surgeon: Carole Civil, MD;  Location: [facility];  Service: Orthopedics;  Laterality: Right;   CATARACT EXTRACTION W/PHACO Left 11/01/2016   Procedure: CATARACT EXTRACTION PHACO AND INTRAOCULAR LENS PLACEMENT (IOC);  Surgeon: Marisa Hua,  Jeneen Rinks, MD;  Location: [facility];  Service: Ophthalmology;  Laterality: Left;  CDE: 3.83   CATARACT EXTRACTION W/PHACO Right 11/29/2016   Procedure: CATARACT EXTRACTION PHACO AND INTRAOCULAR LENS PLACEMENT RIGHT EYE;  Surgeon: Baruch Goldmann, MD;  Location: [facility];  Service: Ophthalmology;  Laterality: Right;  CDE: 8.71   colon tumor removed  2010   Dr. 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 submucosal lipoma on path   COLONOSCOPY  05/2013   Dr. Anthony Sar: normal. (h/o colon polyps)   COLONOSCOPY  2011   normal   COLONOSCOPY  10/2003   Dr. Lindalou Hose: large polypoid mass hepatic fluexure   ESOPHAGOGASTRODUODENOSCOPY N/A 04/29/2016   Procedure: ESOPHAGOGASTRODUODENOSCOPY (EGD);  Surgeon: Danie Binder, MD;  Location: [facility];  Service: Endoscopy;  Laterality: N/A;  9:15am   SAVORY DILATION N/A 04/29/2016   Procedure: SAVORY DILATION;  Surgeon: Danie Binder, MD;  Location: [facility];  Service:  Endoscopy;  Laterality: N/A;     Social History:   reports that she quit smoking about 19 years ago. Her smoking use included cigarettes. She has a 40.00 pack-year smoking history. She has never used smokeless tobacco. She reports that she does not drink alcohol and does not use drugs.   Family History:  Her family history includes Melanoma in her brother. There is no history of Colon cancer.   Allergies Allergies  Allergen Reactions   Onion     wheezing     Home Medications  Prior to Admission medications   Medication Sig Start Date End Date Taking? Authorizing Provider  albuterol (PROVENTIL) (2.5 MG/3ML) 0.083% nebulizer solution Take 3 mLs (2.5 mg total) by nebulization every 6 (six) hours as needed for wheezing or shortness of breath. 02/14/21  Yes Chesley Mires, MD  albuterol-ipratropium (COMBIVENT) 18-103 MCG/ACT inhaler Inhale 2 puffs into the lungs every 6 (six) hours as needed for wheezing. 11/27/12  Yes Vernie Shanks, MD  apixaban (ELIQUIS) 5 MG TABS tablet Take 1 tablet (5 mg total) by mouth 2 (two) times daily. 12/20/21  Yes Johnson, Clanford L, MD  aspirin 81 MG tablet Take 81 mg by mouth daily.   Yes [provider]  atorvastatin (LIPITOR) 80 MG tablet ALTERNATE TAKING 1 TABLET EVERY OTHER DAY WITH 1/2 TABLET EVERY OTHERDAY 01/13/14  Yes Chipper Herb, MD  Biotin w/ Vitamins C & E (HAIR/SKIN/NAILS PO) Take 1 tablet by mouth daily. Takes once a day.   Yes [provider]  Budeson-Glycopyrrol-Formoterol (BREZTRI AEROSPHERE) 160-9-4.8 MCG/ACT AERO Inhale 2 puffs into the lungs in the morning and at bedtime. 03/08/21  Yes Chesley Mires, MD  docusate sodium (COLACE) 100 MG capsule Take 1 capsule (100 mg total) by mouth daily. 12/04/21 12/04/22 Yes Shah, Pratik D, DO  ferrous sulfate 325 (65 FE) MG tablet Take 1 tablet (325 mg total) by mouth daily. 12/04/21 12/04/22 Yes Shah, Pratik D, DO  fluticasone (FLONASE) 50 MCG/ACT nasal spray Place 1 spray into both  nostrils daily. 10/19/20  Yes Chesley Mires, MD  gabapentin (NEURONTIN) 100 MG capsule Take 1 capsule (100 mg total) by mouth 3 (three) times daily. 12/20/21  Yes Johnson, Clanford L, MD  glimepiride (AMARYL) 2 MG tablet Take 4 mg by mouth daily with breakfast.    Yes [provider]  guaiFENesin (MUCINEX) 600 MG 12 hr tablet Take 2 tablets (1,200 mg total) by mouth 2 (two) times daily as needed for cough or to loosen phlegm. 02/14/21  Yes Sood,  Vineet, MD  Hypromellose (ARTIFICIAL TEARS OP) Place 1 drop into both eyes 4 (four) times daily.   Yes [provider]  LANTUS SOLOSTAR 100 UNIT/ML Solostar Pen Inject 20 Units into the skin in the morning and at bedtime. 11/22/20  Yes [provider]  losartan-hydrochlorothiazide (HYZAAR) 100-12.5 MG per tablet TAKE ONE (1) TABLET EACH DAY Patient taking differently: Take 1 tablet by mouth daily. 03/09/14  Yes Chipper Herb, MD  metoprolol (LOPRESSOR) 50 MG tablet Take 50 mg by mouth 2 (two) times daily.   Yes [provider]  montelukast (SINGULAIR) 10 MG tablet TAKE ONE TABLET BY MOUTH AT BEDTIME 07/30/21  Yes Chesley Mires, MD  Multiple Vitamins-Minerals (CENTRUM SILVER PO) Take 1 tablet by mouth daily.   Yes [provider]  Omega-3 Fatty Acids (FISH OIL) 1000 MG CAPS Take 1,000 mg by mouth in the morning, at noon, and at bedtime.   Yes [provider]  oxybutynin (DITROPAN) 5 MG tablet Take 5 mg by mouth every 8 (eight) hours as needed for bladder spasms.    Yes [provider]  pantoprazole (PROTONIX) 40 MG tablet Take 40 mg by mouth daily. 10/23/21  Yes [provider]  predniSONE (DELTASONE) 20 MG tablet Take 3 PO QAM x7days, 2 PO QAM x7days, 1 PO QAM x7days Patient taking differently: Take 20 mg by mouth See admin instructions. Take 3 PO QAM x7days, 2 PO QAM x7days, 1 PO QAM x7days 12/21/21  Yes Johnson, Clanford L, MD  sitaGLIPtin-metformin (JANUMET) 50-1000 MG tablet Take 1 tablet  by mouth 2 (two) times daily with a meal.   Yes [provider]  triamcinolone cream (KENALOG) 0.1 % Apply 1 application topically daily as needed (eczema).   Yes [provider]  polyethylene glycol (MIRALAX / GLYCOLAX) 17 g packet Take 17 g by mouth daily as needed for mild constipation. 12/20/21   Murlean Iba, MD      Kara Mead MD. FCCP. [facility] Pager : [phone]  If no response to pager , please call [phone] until 7 pm After 7:00 pm call Elink  [phone]   12/25/2021

## 2021-12-25 NOTE — Progress Notes (Addendum)
Pharmacy Antibiotic Note  Michaela Moon is a 86 y.o. female admitted on 12/25/2021 with pneumonia.  Pharmacy has been consulted for Vancomycin dosing.  Plan: Vancomycin '1500mg'$  IV Loading dose Vancomycin '1250mg'$  IV q 24hrs Monitor renal function, cbc, and culture data F/u continuation of cefepime dosing  Height: '5\' 5"'$  (165.1 cm) Weight: 80.7 kg (177 lb 14.6 oz) IBW/kg (Calculated) : 57 Predicted AUC 494; ke 0.043 ;Vd 58L  Temp (24hrs), Avg:95.5 F (35.3 C), Min:95.5 F (35.3 C), Max:95.5 F (35.3 C)  Recent Labs  Lab 12/19/21 0436 12/25/21 0823 12/25/21 1033  WBC 16.3* 25.4*  --   CREATININE 0.68 0.54  --   LATICACIDVEN  --  1.2 2.3*    Estimated Creatinine Clearance: 55 mL/min (by C-G formula based on SCr of 0.54 mg/dL).    Allergies  Allergen Reactions   Onion     wheezing    Antimicrobials this admission: 10/31 Cefepime x 1  10/31 Vanc >>   Microbiology results: 10/31 BCx:    Thank you for allowing pharmacy to be a part of this patient's care.  Lorenso Courier [facility] 12/25/2021 11:35 AM

## 2021-12-26 DIAGNOSIS — J9621 Acute and chronic respiratory failure with hypoxia: Secondary | ICD-10-CM | POA: Diagnosis not present

## 2021-12-26 DIAGNOSIS — J189 Pneumonia, unspecified organism: Secondary | ICD-10-CM | POA: Diagnosis not present

## 2021-12-26 LAB — COMPREHENSIVE METABOLIC PANEL
ALT: 69 U/L — ABNORMAL HIGH (ref 0–44)
AST: 19 U/L (ref 15–41)
Albumin: 2.9 g/dL — ABNORMAL LOW (ref 3.5–5.0)
Alkaline Phosphatase: 38 U/L (ref 38–126)
Anion gap: 5 (ref 5–15)
BUN: 30 mg/dL — ABNORMAL HIGH (ref 8–23)
CO2: 26 mmol/L (ref 22–32)
Calcium: 10.1 mg/dL (ref 8.9–10.3)
Chloride: 106 mmol/L (ref 98–111)
Creatinine, Ser: 0.86 mg/dL (ref 0.44–1.00)
GFR, Estimated: >60 mL/min (ref >60)
Glucose, Bld: 263 mg/dL — ABNORMAL HIGH (ref 70–99)
Potassium: 4.6 mmol/L (ref 3.5–5.1)
Sodium: 137 mmol/L (ref 135–145)
Total Bilirubin: 0.6 mg/dL (ref 0.3–1.2)
Total Protein: 5.8 g/dL — ABNORMAL LOW (ref 6.5–8.1)

## 2021-12-26 LAB — CBC
HCT: 29.8 % — ABNORMAL LOW (ref 36.0–46.0)
Hemoglobin: 9.1 g/dL — ABNORMAL LOW (ref 12.0–15.0)
MCH: 26.8 pg (ref 26.0–34.0)
MCHC: 30.5 g/dL (ref 30.0–36.0)
MCV: 87.9 fL (ref 80.0–100.0)
Platelets: 124 10*3/uL — ABNORMAL LOW (ref 150–400)
RBC: 3.39 MIL/uL — ABNORMAL LOW (ref 3.87–5.11)
WBC: 13.3 10*3/uL — ABNORMAL HIGH (ref 4.0–10.5)
nRBC: 0 % (ref 0.0–0.2)

## 2021-12-26 LAB — GLUCOSE, CAPILLARY
Glucose-Capillary: 217 mg/dL — ABNORMAL HIGH (ref 70–99)
Glucose-Capillary: 191 mg/dL — ABNORMAL HIGH (ref 70–99)
Glucose-Capillary: 314 mg/dL — ABNORMAL HIGH (ref 70–99)
Glucose-Capillary: 279 mg/dL — ABNORMAL HIGH (ref 70–99)

## 2021-12-26 LAB — STREP PNEUMONIAE URINARY ANTIGEN: Strep Pneumo Urinary Antigen: NEGATIVE

## 2021-12-26 MED ORDER — CHLORHEXIDINE GLUCONATE CLOTH 2 % EX PADS
6.0000 | MEDICATED_PAD | Freq: Every day | CUTANEOUS | Status: DC
  Administered 2021-12-26 – 2022-01-01 (×5): 6 via TOPICAL

## 2021-12-26 MED ORDER — INSULIN GLARGINE-YFGN 100 UNIT/ML ~~LOC~~ SOLN
8.0000 [IU] | Freq: Every day | SUBCUTANEOUS | Status: DC
  Administered 2021-12-26: 8 [IU] via SUBCUTANEOUS
  Filled 2021-12-26 (×3): qty 0.08

## 2021-12-26 NOTE — Evaluation (Signed)
Clinical/Bedside Swallow Evaluation Patient Details  Name: Michaela Moon MRN: [id number] Date of Birth: October 02, 1935  Today's Date: 12/26/2021 Time: SLP Start Time (ACUTE ONLY): 14 SLP Stop Time (ACUTE ONLY): 1427 SLP Time Calculation (min) (ACUTE ONLY): 26 min  Past Medical History:  Past Medical History:  Diagnosis Date   Arthritis    Asthma    COPD (chronic obstructive pulmonary disease) ([place])    Diabetes mellitus without complication (HCC)    GERD (gastroesophageal reflux disease)    Headache    Hyperlipidemia    Hypertension    Macular degeneration    Neuropathy    Obesity    Osteopenia    Stress incontinence    Past Surgical History:  Past Surgical History:  Procedure Laterality Date   BREAST LUMPECTOMY Bilateral    no cancer   CARPAL TUNNEL RELEASE Left 10/23/2017   Procedure: LEFT CARPAL TUNNEL RELEASE;  Surgeon: Carole Civil, MD;  Location: [facility];  Service: Orthopedics;  Laterality: Left;   CARPAL TUNNEL RELEASE Right 11/20/2017   Procedure: CARPAL TUNNEL RELEASE;  Surgeon: Carole Civil, MD;  Location: [facility];  Service: Orthopedics;  Laterality: Right;   CATARACT EXTRACTION W/PHACO Left 11/01/2016   Procedure: CATARACT EXTRACTION PHACO AND INTRAOCULAR LENS PLACEMENT (IOC);  Surgeon: Baruch Goldmann, MD;  Location: [facility];  Service: Ophthalmology;  Laterality: Left;  CDE: 3.83   CATARACT EXTRACTION W/PHACO Right 11/29/2016   Procedure: CATARACT EXTRACTION PHACO AND INTRAOCULAR LENS PLACEMENT RIGHT EYE;  Surgeon: Baruch Goldmann, MD;  Location: [facility];  Service: Ophthalmology;  Laterality: Right;  CDE: 8.71   colon tumor removed  2010   Dr. 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 submucosal lipoma on path   COLONOSCOPY  05/2013   Dr. Anthony Sar: normal. (h/o colon polyps)   COLONOSCOPY  2011   normal   COLONOSCOPY  10/2003   Dr. Lindalou Hose: large polypoid mass hepatic fluexure   ESOPHAGOGASTRODUODENOSCOPY N/A 04/29/2016    Procedure: ESOPHAGOGASTRODUODENOSCOPY (EGD);  Surgeon: Danie Binder, MD;  Location: [facility];  Service: Endoscopy;  Laterality: N/A;  9:15am   SAVORY DILATION N/A 04/29/2016   Procedure: SAVORY DILATION;  Surgeon: Danie Binder, MD;  [facility]: [facility];  Service: Endoscopy;  Laterality: N/A;   HPI:  Michaela Moon is a 86 y.o. female with medical history significant of COPD, diabetes, hypertension, recently diagnosed bilateral lower extremity DVT on anticoagulation, who was unfortunately who has had multiple hospitalizations since September of this year when she was diagnosed with COVID.  She was treated with antivirals and steroids at the time of her diagnosis in September at [facility] and was discharged home with oxygen on 2 L.  Unfortunately, since that time she has had 2 admissions to [facility] with multifocal pneumonia.  She was treated with ceftriaxone and azithromycin on both occasions.  She was also treated with steroids and bronchodilators.  She was discharged home on 4 L of oxygen at the time of discharge.  After her last discharge, she reports that she was initially feeling well and did not have any shortness of breath.  She does have a chronic cough which she feels has not really changed.  Her son noted that she felt unwell last night, but her respiratory status appears to be near baseline.  This morning, he had noted her in bed and checked a pulse ox on her and it was noted to be in the 80s despite wearing 4  L of oxygen.  He proceeded to give her a breathing treatment which transiently improved her oxygen saturations into the 90s, but then subsequently began to drop back down to the 80s.  EMS was called and placed her on nonrebreather mask on their arrival.  Despite this, her oxygen saturations remained in the 80s.  On arrival to the emergency room, she was initially placed on BiPAP but did not tolerate this very well.  She was subsequently transitioned to high flow nasal  cannula which appears to have had a better effect within oxygen saturations in the 90s.  She was profoundly hypoglycemic with blood sugar in the 20s to 30s and received dextrose. BSE was requested   MBSS completed 10/24 <<Pt presents with oropharyngeal swallowing to be essentially within functional limits. Note occasional pharyngeal penetration with thin liquids via cup and more frequent and deep/frank penetration with thin via straw, however all penetrates are during the swallow and are completely cleared by pharyngeal squeeze and pharyngeal stripping wave. Puree and regular textures are consumed without incident. Pt reports that she takes meds with puree at home; barium tablet was assessed with puree and note breif stasis of the tablet in the cervical esophagus visualized during esophageal sweep (no radiologist present to confirm) but an additional presentation of puree facilitated tablet passing through. Otherwise esophageal sweep was unremarkable. Recommend continue with a soft diet/D3 and thin liquids, however secondary to respiratory compromise and current deconditioning recommend avoid straws at this time. Recommend meds whole with puree and recommend universal aspiration precautions and esophageal precations. Above findings and recommendations reviewed with RN, Pt and [name]. There are no further ST needs noted at this time. ST will sign off>>   Assessment / Plan / Recommendation  Clinical Impression  Clinical swallowing evaluation completed while Pt was sitting upright in bed; MBSS completed during recent hospitalization on 10/24 (detailed above). Pt presents very similarly at bedside to her resent BSE with baseline coughing and some wet vocal quality prior to any PO trials. Pt consumed thin liquids, puree textures and solid textures without overt s/sx of aspiration. [name] reports he ensures Pt does not use straws (per MBS recommendations). They continue to report occasional "choking" episodes  that happen anywhere from 3x/wk to once every other week. Despite no aspiration being visualized on recent MBSS, we cannot definitively state that this Pt is not experiencing episodes of aspiration or that she is possibly experiencing postprandial aspiration related to known esophageal dysphagia/GERD. Our team can complete another MBSS, however since aspiration seems episodic it will likely not look different/be of benefit, however if MD would like MBSS, please order. Recommend continue with D3/mech soft and thin liquids at this time. Meds are ok whole with liquids. NO STRAWS. Pt continues to be at high risk for aspiraiton secondary to her compromised respiratory status and deconditioned status. ST will continue to follow acutely. Thank you, SLP Visit Diagnosis: Dysphagia, unspecified (R13.10)    Aspiration Risk  Mild aspiration risk    Diet Recommendation Dysphagia 3 (Mech soft);Thin liquid   Liquid Administration via: Cup;No straw Medication Administration: Whole meds with liquid Supervision: Patient able to self feed;Intermittent supervision to cue for compensatory strategies Compensations: Slow rate Postural Changes: Seated upright at 90 degrees    Other  Recommendations Recommended Consults: Consider esophageal assessment;Consider GI evaluation Oral Care Recommendations: Oral care BID;Staff/trained caregiver to provide oral care Other Recommendations: Clarify dietary restrictions    Recommendations for follow up therapy are one component of a multi-disciplinary  discharge planning process, led by the attending physician.  Recommendations may be updated based on patient status, additional functional criteria and insurance authorization.  Follow up Recommendations Follow physician's recommendations for discharge plan and follow up therapies      Assistance Recommended at Discharge Intermittent Supervision/Assistance  Functional Status Assessment Patient has had a recent decline in their  functional status and demonstrates the ability to make significant improvements in function in a reasonable and predictable amount of time.  Frequency and Duration min 2x/week  1 week       Prognosis Prognosis for Safe Diet Advancement: Good      Swallow Study   General HPI: Michaela Moon is a 86 y.o. female with medical history significant of COPD, diabetes, hypertension, recently diagnosed bilateral lower extremity DVT on anticoagulation, who was unfortunately who has had multiple hospitalizations since September of this year when she was diagnosed with COVID.  She was treated with antivirals and steroids at the time of her diagnosis in September at [facility] and was discharged home with oxygen on 2 L.  Unfortunately, since that time she has had 2 admissions to [facility] with multifocal pneumonia.  She was treated with ceftriaxone and azithromycin on both occasions.  She was also treated with steroids and bronchodilators.  She was discharged home on 4 L of oxygen at the time of discharge.  After her last discharge, she reports that she was initially feeling well and did not have any shortness of breath.  She does have a chronic cough which she feels has not really changed.  Her son noted that she felt unwell last night, but her respiratory status appears to be near baseline.  This morning, he had noted her in bed and checked a pulse ox on her and it was noted to be in the 80s despite wearing 4 L of oxygen.  He proceeded to give her a breathing treatment which transiently improved her oxygen saturations into the 90s, but then subsequently began to drop back down to the 80s.  EMS was called and placed her on nonrebreather mask on their arrival.  Despite this, her oxygen saturations remained in the 80s.  On arrival to the emergency room, she was initially placed on BiPAP but did not tolerate this very well.  She was subsequently transitioned to high flow nasal cannula which appears to have  had a better effect within oxygen saturations in the 90s.  She was profoundly hypoglycemic with blood sugar in the 20s to 30s and received dextrose. BSE was requested Type of Study: Bedside Swallow Evaluation Previous Swallow Assessment: MBSS 10/24 Diet Prior to this Study: Regular;Thin liquids Temperature Spikes Noted: No Respiratory Status: Nasal cannula History of Recent Intubation: No Behavior/Cognition: Alert;Cooperative;Pleasant mood Oral Cavity Assessment: Within Functional Limits Oral Care Completed by SLP: Recent completion by staff Oral Cavity - Dentition: Dentures, top;Dentures, bottom Vision: Functional for self-feeding Self-Feeding Abilities: Able to feed self;Needs set up Patient Positioning: Upright in bed Baseline Vocal Quality: Normal Volitional Cough: Weak;Congested Volitional Swallow: Able to elicit    Oral/Motor/Sensory Function Overall Oral Motor/Sensory Function: Within functional limits   Ice Chips Ice chips: Within functional limits   Thin Liquid Thin Liquid: Within functional limits    Nectar Thick Nectar Thick Liquid: Not tested   Honey Thick Honey Thick Liquid: Not tested   Puree Puree: Within functional limits   Solid     Solid: Within functional limits     Michaela Gerken H. Izora Ribas MA, CCC-SLP Speech  Language Pathologist  [name] 12/26/2021,3:22 PM

## 2021-12-26 NOTE — Inpatient Diabetes Management (Signed)
Inpatient Diabetes Program Recommendations  AACE/ADA: New Consensus Statement on Inpatient Glycemic Control   Target Ranges:  Prepandial:   less than 140 mg/dL      Peak postprandial:   less than 180 mg/dL (1-2 hours)      Critically ill patients:  140 - 180 mg/dL    Latest Reference Range & Units 12/25/21 09:24 12/25/21 09:59 12/25/21 11:57 12/25/21 16:24 12/25/21 20:59 12/26/21 07:16  Glucose-Capillary 70 - 99 mg/dL 23 (LL) 144 (H) 126 (H) 140 (H) 274 (H) 217 (H)    Latest Reference Range & Units 12/25/21 08:23  Glucose 70 - 99 mg/dL 30 (LL)    Review of Glycemic Control  Diabetes history: DM2 Outpatient Diabetes medications: Lantus 20 units BID, Janumet 50-1000 mg BID, Amaryl 4 mg daily Current orders for Inpatient glycemic control: Novolog 0-6 units TID with meals; Solumedrol 40 mg Q12H  Inpatient Diabetes Program Recommendations:    Insulin: Noted initially hypoglycemic which has resolved and Novolog correction ordered. No insulin has been given since arrival to hospital. Fasting glucose 217 mg/dl this am. Please consider adding Novolog 0-5 units QHS for bedtime correction. If glucose is consistently over 180 mg/dl with Novolog correction, please consider ordering basal insulin.  Thanks, Barnie Alderman, RN, MSN, [place] Diabetes Coordinator Inpatient Diabetes Program [phone] (Team Pager from 8am to [place])

## 2021-12-26 NOTE — Progress Notes (Signed)
  Transition of Care [facility]) Screening Note   Patient Details  Name: Michaela Moon Date of Birth: May 10, 1935   Transition of Care [facility]) CM/SW Contact:    Ihor Gully, LCSW Phone Number: 12/26/2021, 4:46 PM    Transition of Care Department [facility]) has reviewed patient and no TOC needs have been identified at this time. We will continue to monitor patient advancement through interdisciplinary progression rounds. If new patient transition needs arise, please place a TOC consult.

## 2021-12-26 NOTE — Progress Notes (Signed)
PROGRESS NOTE     Michaela Moon, is a 86 y.o. female, DOB - 1935/12/27, TMH:[id number]  Admit date - 12/25/2021   Admitting Physician Kathie Dike, MD  Outpatient Primary MD for the patient is [name], MD  LOS - 1  Chief Complaint  Patient presents with   Respiratory Distress        Brief Narrative:   86 y.o. female with medical history significant of COPD, diabetes, hypertension, recently diagnosed bilateral lower extremity DVT on anticoagulation admitted on 12/25/2021 with acute on chronic hypoxic respiratory failure in the setting of recurrent aspiration/multifocal pneumonia    -Assessment and Plan: 1) recurrent aspiration/multifocal pneumonia---pulmonology consult appreciated- Speech pathologist consults appreciated recommends dysphagia 3 (Mech soft);Thin liquid  -Continue cefepime and azithromycin,, bronchial dilators and mucolytics  2) acute on chronic hypoxic respiratory failure----secondary to #1 above -Weaned off continuous BiPAP -Oxygen currently weaned down to 11 L via nasal cannula -PTA patient was on 4 L per nasal cannula -Management as above #1  3) acute COPD exacerbation--- secondary to #1 above =-Treat as above #1 #2 -IV Solu-Medrol as ordered  4)HTN-continue metoprolol  5)DM2- .  A1c 6.3 reflecting excellent diabetic control PTA -Anticipate worsening hyperglycemia with steroids Use Novolog/Humalog Sliding scale insulin with Accu-Cheks/Fingersticks as ordered   6)GERD--stable, continue PPI especially while on steroids  Disposition/Need for in-Hospital Stay- patient unable to be discharged at this time due to --acute on chronic hypoxic respiratory failure secondary to recurrent aspiration pneumonia requiring IV antibiotics and high flow oxygen*  Status is: Inpatient   Disposition: The patient is from: Home              Anticipated d/c is to: Home with [facility]              Anticipated d/c date is: 3 days              Patient currently is not  medically stable to d/c. Barriers: Not Clinically Stable-   Code Status :  -  Code Status: DNR   Family Communication:   Discussed with son at bedside   DVT Prophylaxis  :   - SCDs   apixaban (ELIQUIS) tablet 5 mg   Lab Results  Component Value Date   PLT 124 (L) 12/26/2021    Inpatient Medications  Scheduled Meds:  apixaban  5 mg Oral BID   arformoterol  15 mcg Nebulization BID   aspirin  81 mg Oral Daily   atorvastatin  40 mg Oral Daily   budesonide (PULMICORT) nebulizer solution  0.25 mg Nebulization BID   Chlorhexidine Gluconate Cloth  6 each Topical Daily   gabapentin  100 mg Oral TID   insulin aspart  0-6 Units Subcutaneous TID WC   insulin glargine-yfgn  8 Units Subcutaneous Daily   ipratropium-albuterol  3 mL Nebulization Q6H   methylPREDNISolone (SOLU-MEDROL) injection  40 mg Intravenous Q12H   metoprolol tartrate  50 mg Oral BID   montelukast  10 mg Oral QHS   pantoprazole  40 mg Oral Daily   Continuous Infusions:  azithromycin 500 mg (12/26/21 1501)   ceFEPime (MAXIPIME) IV 2 g (12/26/21 1107)   vancomycin 1,250 mg (12/26/21 1222)   PRN Meds:.acetaminophen **OR** acetaminophen, albuterol, guaiFENesin, ondansetron **OR** ondansetron (ZOFRAN) IV, oxybutynin   Anti-infectives (From admission, onward)    Start     Dose/Rate Route Frequency Ordered Stop   12/26/21 1200  vancomycin (VANCOREADY) IVPB 1250 mg/250 mL        1,250 mg 166.7  mL/hr over 90 Minutes Intravenous Every 24 hours 12/25/21 1134     12/25/21 2200  ceFEPIme (MAXIPIME) 2 g in sodium chloride 0.9 % 100 mL IVPB        2 g 200 mL/hr over 30 Minutes Intravenous Every 12 hours 12/25/21 1250 12/30/21 2159   12/25/21 1430  azithromycin (ZITHROMAX) 500 mg in sodium chloride 0.9 % 250 mL IVPB        500 mg 250 mL/hr over 60 Minutes Intravenous Every 24 hours 12/25/21 1250 12/30/21 1429   12/25/21 1200  vancomycin (VANCOREADY) IVPB 1500 mg/300 mL        1,500 mg 150 mL/hr over 120 Minutes Intravenous   Once 12/25/21 1129 12/25/21 1352   12/25/21 1115  ceFEPIme (MAXIPIME) 2 g in sodium chloride 0.9 % 100 mL IVPB        2 g 200 mL/hr over 30 Minutes Intravenous  Once 12/25/21 1105 12/25/21 1241         Subjective: Michaela Moon today has no fevers, no emesis,  No chest pain,   -0 cough hypoxia and dyspnea persist -Son at bedside, questions answered -Currently requiring 11 L of oxygen via nasal cannula   Objective: Vitals:   12/26/21 1500 12/26/21 1600 12/26/21 1700 12/26/21 1800  BP: (!) 148/48 (!) 115/100  (!) 143/122  Pulse: 77 75 77 77  Resp: (!) '22 18 19 '$ (!) 23  Temp:      TempSrc:      SpO2: (!) 89% 95% 96% 96%  Weight:      Height:        Intake/Output Summary (Last 24 hours) at 12/26/2021 1824 Last data filed at 12/26/2021 1800 Gross per 24 hour  Intake 1179.81 ml  Output 1300 ml  Net -120.19 ml   Filed Weights   12/25/21 0817 12/25/21 1300 12/26/21 0600  Weight: 80.7 kg 79 kg 79.2 kg    Physical Exam  Gen:- Awake Alert, conversational dyspnea HEENT:- [place].AT, No sclera icterus, Nose-- [place] 11L/min Neck-Supple Neck,No JVD,.  Lungs-diminished breath sounds, scattered rhonchi and wheezes bilaterally  CV- S1, S2 normal, regular  Abd-  +ve B.Sounds, Abd Soft, No tenderness,    Extremity/Skin:- No  edema, pedal pulses present  Psych-affect is appropriate, oriented x3 Neuro-generalized weakness, no new focal deficits, no tremors  Data Reviewed: I have personally reviewed following labs and imaging studies  CBC: Recent Labs  Lab 12/25/21 0823 12/26/21 0302  WBC 25.4* 13.3*  NEUTROABS 22.8*  --   HGB 10.5* 9.1*  HCT 34.0* 29.8*  MCV 86.7 87.9  PLT 155 539*   Basic Metabolic Panel: Recent Labs  Lab 12/25/21 0823 12/26/21 0302  NA 138 137  K 3.4* 4.6  CL 106 106  CO2 27 26  GLUCOSE 30* 263*  BUN 33* 30*  CREATININE 0.54 0.86  CALCIUM 10.5* 10.1   GFR: Estimated Creatinine Clearance: 50.7 mL/min (by C-G formula based on SCr of 0.86  mg/dL). Liver Function Tests: Recent Labs  Lab 12/26/21 0302  AST 19  ALT 69*  ALKPHOS 38  BILITOT 0.6  PROT 5.8*  ALBUMIN 2.9*   Recent Results (from the past 240 hour(s))  Culture, blood (routine x 2)     Status: None (Preliminary result)   Collection Time: 12/25/21  8:25 AM   Specimen: Right Antecubital; Blood  Result Value Ref Range Status   Specimen Description RIGHT ANTECUBITAL  Final   Special Requests   Final    BOTTLES DRAWN AEROBIC AND ANAEROBIC Blood  Culture adequate volume   Culture   Final    NO GROWTH < 24 HOURS Performed at [facility], [address] [place], [place] [address]    Report Status PENDING  Incomplete  Culture, blood (routine x 2)     Status: None (Preliminary result)   Collection Time: 12/25/21  8:40 AM   Specimen: Left Antecubital; Blood  Result Value Ref Range Status   Specimen Description LEFT ANTECUBITAL  Final   Special Requests   Final    BOTTLES DRAWN AEROBIC AND ANAEROBIC Blood Culture adequate volume   Culture   Final    NO GROWTH < 24 HOURS Performed at [facility], [address], [place], [place] [address]    Report Status PENDING  Incomplete  SARS Coronavirus 2 by RT PCR (hospital order, performed in [place] hospital lab) *cepheid single result test* Anterior Nasal Swab     Status: None   Collection Time: 12/25/21  9:08 AM   Specimen: Anterior Nasal Swab  Result Value Ref Range Status   SARS Coronavirus 2 by RT PCR NEGATIVE NEGATIVE Final    Comment: (NOTE) SARS-CoV-2 target nucleic acids are NOT DETECTED.  The SARS-CoV-2 RNA is generally detectable in upper and lower respiratory specimens during the acute phase of infection. The lowest concentration of SARS-CoV-2 viral copies this assay can detect is 250 copies / mL. A negative result does not preclude SARS-CoV-2 infection and should not be used as the sole basis for treatment or other patient management decisions.  A negative result may occur with improper specimen  collection / handling, submission of specimen other than nasopharyngeal swab, presence of viral mutation(s) within the areas targeted by this assay, and inadequate number of viral copies (<250 copies / mL). A negative result must be combined with clinical observations, patient history, and epidemiological information.  Fact Sheet for Patients:   https://www.patel.info/  Fact Sheet for Healthcare Providers: https://hall.com/  This test is not yet approved or  cleared by the [place] FDA and has been authorized for detection and/or diagnosis of SARS-CoV-2 by FDA under an Emergency Use Authorization (EUA).  This EUA will remain in effect (meaning this test can be used) for the duration of the COVID-19 declaration under Section 564(b)(1) of the Act, 21 U.S.C. section 360bbb-3(b)(1), unless the authorization is terminated or revoked sooner.  Performed at [facility], [address] [place], [place] [address]   Culture, blood (Routine X 2) w Reflex to ID Panel     Status: None (Preliminary result)   Collection Time: 12/25/21  1:59 PM   Specimen: BLOOD LEFT HAND  Result Value Ref Range Status   Specimen Description BLOOD LEFT HAND  Final   Special Requests   Final    BOTTLES DRAWN AEROBIC ONLY Blood Culture adequate volume   Culture   Final    NO GROWTH < 24 HOURS Performed at [facility], [address], [place], [place] [address]    Report Status PENDING  Incomplete  Culture, blood (Routine X 2) w Reflex to ID Panel     Status: None (Preliminary result)   Collection Time: 12/25/21  1:59 PM   Specimen: BLOOD RIGHT HAND  Result Value Ref Range Status   Specimen Description BLOOD RIGHT HAND  Final   Special Requests   Final    BOTTLES DRAWN AEROBIC ONLY Blood Culture results may not be optimal due to an inadequate volume of blood received in culture bottles   Culture   Final  NO GROWTH < 24 HOURS Performed at [facility],  [address] [place], [place] [address]    Report Status PENDING  Incomplete  Expectorated Sputum Assessment w Gram Stain, Rflx to Resp Cult     Status: None   Collection Time: 12/25/21  7:30 PM   Specimen: Expectorated Sputum  Result Value Ref Range Status   Specimen Description EXPECTORATED SPUTUM  Final   Special Requests NONE  Final   Sputum evaluation   Final    THIS SPECIMEN IS ACCEPTABLE FOR SPUTUM CULTURE Performed at [facility], [address], [place], [place] [address]    Report Status 12/25/2021 FINAL  Final  Culture, Respiratory w Gram Stain     Status: None (Preliminary result)   Collection Time: 12/25/21  7:30 PM  Result Value Ref Range Status   Specimen Description   Final    EXPECTORATED SPUTUM Performed at [facility], [address], [place], [place] [address]    Special Requests   Final    NONE Reflexed from [id number] Performed at [facility], [address], [place], [place] [address]    Gram Stain   Final    MODERATE WBC PRESENT,BOTH PMN AND MONONUCLEAR RARE GRAM POSITIVE COCCI IN PAIRS IN CLUSTERS Performed at [facility], [address], [place], [place] [address]    Culture PENDING  Incomplete   Report Status PENDING  Incomplete    Radiology Studies: CT Angio Chest PE W and/or Wo Contrast  Result Date: 12/25/2021 CLINICAL DATA:  High clinical suspicion of pulmonary embolism, hypoxemia EXAM: CT ANGIOGRAPHY CHEST WITH CONTRAST TECHNIQUE: Multidetector CT imaging of the chest was performed using the standard protocol during bolus administration of intravenous contrast. Multiplanar CT image reconstructions and MIPs were obtained to evaluate the vascular anatomy. RADIATION DOSE REDUCTION: This exam was performed according to the departmental dose-optimization program which includes automated exposure control, adjustment of the mA and/or kV according to patient size and/or use of iterative reconstruction technique. CONTRAST:  27m OMNIPAQUE IOHEXOL 350 MG/ML  SOLN COMPARISON:  12/08/2021 FINDINGS: Cardiovascular: Atherosclerotic calcifications aorta and coronary arteries. Aorta normal caliber without aneurysm or dissection. No pericardial effusion. Pulmonary arteries well opacified and patent. No evidence of pulmonary embolism. Mediastinum/Nodes: Esophagus unremarkable. Base of cervical region normal appearance. No thoracic adenopathy. Lungs/Pleura: Extensive BILATERAL airspace infiltrates consistent with multifocal pneumonia. No pleural effusion or pneumothorax. No discrete mass. Upper Abdomen: Small hepatic LEFT lobe and RIGHT renal cysts, simple features; no follow-up imaging recommended. Remaining visualized upper abdomen unremarkable. Musculoskeletal: No acute osseous findings. Review of the MIP images confirms the above findings. IMPRESSION: No evidence of pulmonary embolism. Extensive BILATERAL airspace infiltrates consistent with multifocal pneumonia. Scattered atherosclerotic calcifications including coronary arteries. Aortic Atherosclerosis (ICD10-I70.0). Electronically Signed   By: MLavonia DanaM.D.   On: 12/25/2021 11:00   DG Chest Port 1 View  Result Date: 12/25/2021 CLINICAL DATA:  Shortness of breath EXAM: PORTABLE CHEST 1 VIEW COMPARISON:  Previous studies including the examination of 12/13/2021 FINDINGS: Transverse diameter of heart is increased. Central pulmonary vessels are prominent. There are patchy alveolar densities in both parahilar regions and lower lung fields with interval worsening. Lateral costophrenic angles are indistinct. There is no pneumothorax. IMPRESSION: Cardiomegaly. Central pulmonary vessels are prominent. There is interval worsening of alveolar densities in parahilar regions and lower lung fields, especially in right lower lung field. Findings suggest pulmonary edema or multifocal pneumonia with interval worsening. Small bilateral pleural effusions. Electronically Signed   By: PElmer Picker  M.D.   On: 12/25/2021 09:15      Scheduled Meds:  apixaban  5 mg Oral BID   arformoterol  15 mcg Nebulization BID   aspirin  81 mg Oral Daily   atorvastatin  40 mg Oral Daily   budesonide (PULMICORT) nebulizer solution  0.25 mg Nebulization BID   Chlorhexidine Gluconate Cloth  6 each Topical Daily   gabapentin  100 mg Oral TID   insulin aspart  0-6 Units Subcutaneous TID WC   insulin glargine-yfgn  8 Units Subcutaneous Daily   ipratropium-albuterol  3 mL Nebulization Q6H   methylPREDNISolone (SOLU-MEDROL) injection  40 mg Intravenous Q12H   metoprolol tartrate  50 mg Oral BID   montelukast  10 mg Oral QHS   pantoprazole  40 mg Oral Daily   Continuous Infusions:  azithromycin 500 mg (12/26/21 1501)   ceFEPime (MAXIPIME) IV 2 g (12/26/21 1107)   vancomycin 1,250 mg (12/26/21 1222)     LOS: 1 day    Roxan Hockey M.D on 12/26/2021 at 6:24 PM  Go to [email or web address] - for contact info  [facility] - Office  [phone]  If 7PM-7AM, please contact night-coverage [email or web address] 12/26/2021, 6:24 PM

## 2021-12-26 NOTE — Progress Notes (Signed)
NAME:  Michaela Moon, MRN:  [id number], DOB:  08-28-1935, LOS: 1 ADMISSION DATE:  12/25/2021, CONSULTATION DATE:  12/26/2021  REFERRING MD:  Gwynneth Albright, CHIEF COMPLAINT: Respiratory distress  History of Present Illness:  This is her third admission in the past 2 months for this 86 year old exsmoker with multifocal pneumonia and acute hypoxic respiratory failure. she was hospitalized 9/2 to 9/12 and treated for COVID-19 pneumonia With Paxlovid, Decadron and empiric antibiotic discharge on 2 L She was again hospitalized 10/4 to 10/10 with multifocal pneumonia and treated with Rocephin and azithromycin, weaned down to 5 L nasal cannula  She will get admitted 10/13 to 10/26 for Bilateral infiltrates and severe hypoxia requiring heated high flow nasal cannula-lingular patchy infiltrate dates back to January 2023 and both lower lobe infiltrates appear gradually worse compared to July 2023 on serial CT imaging   -Differential includes chronic aspiration versus inflammatory etiology, doubt malignancy.  Right upper lobe pulmonary nodule appears stable since January 2023  She was treated with high-dose IV Solu-Medrol and diuresis and improved gradually to 5 L nasal cannula on discharge .  Repeat swallow evaluation only showed mild aspiration risk She was discharged on 40 mg of prednisone with a plan to taper by 10 mg every week  She is again admitted for respiratory distress and hypoxia, required BiPAP in the emergency room, transition to high flow nasal cannula. Chest x-ray and CT chest again confirmed bilateral multifocal pneumonia  Pertinent  Medical History  hypertension,  T2DM,  dysphagia requiring dilation 04/2016 for ? web COPD and chronic respiratory failure on 2 L of oxygen   Significant Hospital Events: Including procedures, antibiotic start and stop dates in addition to other pertinent events   CT angiogram chest 12/08/2021 multilobar airspace disease with similar pattern compared to  January 2023.  Lingular opacity appears chronic, lower lobe airspace disease is worse, right upper lobe spiculated nodule unchanged from prior   10/24 swallow evaluation dysphagia 3 diet mild aspiration risk  Interim History / Subjective:   Continues to require 11 L high flow nasal cannula Afebrile Good urine output 1.7 L  Objective   Blood pressure (!) 158/40, pulse 77, temperature 98 F (36.7 C), temperature source Oral, resp. rate (!) 22, height '5\' 5"'$  (1.651 m), weight 79.2 kg, SpO2 (!) 89 %.    FiO2 (%):  [11 %] 11 %   Intake/Output Summary (Last 24 hours) at 12/26/2021 1512 Last data filed at 12/26/2021 1500 Gross per 24 hour  Intake 1338.84 ml  Output 700 ml  Net 638.84 ml    Filed Weights   12/25/21 0817 12/25/21 1300 12/26/21 0600  Weight: 80.7 kg 79 kg 79.2 kg    Examination: General: Elderly woman sitting up in bed no distress HENT: Mild pallor, no JVD, no icterus Lungs: Clear breath sounds bilateral, no accessory muscle use Cardiovascular: S1-S2 regular, no murmur Abdomen: Soft, nontender no organomegaly Extremities: No edema, no deformity Neuro: Alert, interactive, nonfocal   Lab showed normal electrolytes, mild leukocytosis Urine Antigen negative Respiratory culture showing GPC in pairs and clusters  Resolved Hospital Problem list     Assessment & Plan:  Acute on chronic hypoxic respiratory failure Multi focal pneumonia -Since she was discharged on prednisone, inflammatory etiology seems less likely.  This is more likely to be recurrent aspiration even though she cleared her swallow evaluation prior to last discharge  -Dysphagia 3 diet with aspiration precautions, will check esophagram once oxygen requirements decrease -Continue high flow nasal cannula ,  if hypoxia worsens she may need heated high flow -Continue empiric Solu-Medrol 40 every 12 , can decrease once oxygen requirements decrease -Tracheobronchial toilet with flutter valve, vest and  Mucinex  Discussed aggressive approach to her recurrent admissions by PEG placement with her son, they will discuss this possibility given its impact on her quality of life  Best Practice (right click and "Reselect all SmartList Selections" daily)   Diet/type: NPO DVT prophylaxis: DOAC GI prophylaxis: PPI Lines: N/A Foley:  N/A Code Status:  DNR Last date of multidisciplinary goals of care discussion [Per [facility]]  Labs   CBC: Recent Labs  Lab 12/25/21 0823 12/26/21 0302  WBC 25.4* 13.3*  NEUTROABS 22.8*  --   HGB 10.5* 9.1*  HCT 34.0* 29.8*  MCV 86.7 87.9  PLT 155 124*     Basic Metabolic Panel: Recent Labs  Lab 12/25/21 0823 12/26/21 0302  NA 138 137  K 3.4* 4.6  CL 106 106  CO2 27 26  GLUCOSE 30* 263*  BUN 33* 30*  CREATININE 0.54 0.86  CALCIUM 10.5* 10.1    GFR: Estimated Creatinine Clearance: 50.7 mL/min (by C-G formula based on SCr of 0.86 mg/dL). Recent Labs  Lab 12/25/21 0823 12/25/21 1033 12/26/21 0302  PROCALCITON <0.10  --   --   WBC 25.4*  --  13.3*  LATICACIDVEN 1.2 2.3*  --      Liver Function Tests: Recent Labs  Lab 12/26/21 0302  AST 19  ALT 69*  ALKPHOS 38  BILITOT 0.6  PROT 5.8*  ALBUMIN 2.9*   No results for input(s): "LIPASE", "AMYLASE" in the last 168 hours. No results for input(s): "AMMONIA" in the last 168 hours.  ABG    Component Value Date/Time   PHART 7.38 12/25/2021 0943   PCO2ART 36 12/25/2021 0943   PO2ART 66 (L) 12/25/2021 0943   HCO3 22.0 12/25/2021 0943   ACIDBASEDEF 3.1 (H) 12/25/2021 0943   O2SAT 96 12/25/2021 0943     Coagulation Profile: No results for input(s): "INR", "PROTIME" in the last 168 hours.  Cardiac Enzymes: No results for input(s): "CKTOTAL", "CKMB", "CKMBINDEX", "TROPONINI" in the last 168 hours.  HbA1C: Hgb A1c MFr Bld  Date/Time Value Ref Range Status  12/08/2021 04:46 AM 6.3 (H) 4.8 - 5.6 % Final    Comment:    (NOTE) Pre diabetes:          5.7%-6.4%  Diabetes:               >6.4%  Glycemic control for   <7.0% adults with diabetes   10/21/2017 11:18 AM 7.5 (H) 4.8 - 5.6 % Final    Comment:    (NOTE) Pre diabetes:          5.7%-6.4% Diabetes:              >6.4% Glycemic control for   <7.0% adults with diabetes     CBG: Recent Labs  Lab 12/25/21 1157 12/25/21 1624 12/25/21 2059 12/26/21 0716 12/26/21 1118  GLUCAP 126* 140* 274* 217* 191*      Kara Mead MD. FCCP. [facility] Pager : [phone]  If no response to pager , please call [phone] until 7 pm After 7:00 pm call Elink  [phone]   12/26/2021

## 2021-12-27 DIAGNOSIS — J189 Pneumonia, unspecified organism: Secondary | ICD-10-CM | POA: Diagnosis not present

## 2021-12-27 DIAGNOSIS — J9601 Acute respiratory failure with hypoxia: Secondary | ICD-10-CM | POA: Diagnosis not present

## 2021-12-27 LAB — LEGIONELLA PNEUMOPHILA SEROGP 1 UR AG: L. pneumophila Serogp 1 Ur Ag: NEGATIVE

## 2021-12-27 LAB — GLUCOSE, CAPILLARY
Glucose-Capillary: 232 mg/dL — ABNORMAL HIGH (ref 70–99)
Glucose-Capillary: 249 mg/dL — ABNORMAL HIGH (ref 70–99)
Glucose-Capillary: 346 mg/dL — ABNORMAL HIGH (ref 70–99)
Glucose-Capillary: 231 mg/dL — ABNORMAL HIGH (ref 70–99)

## 2021-12-27 MED ORDER — INSULIN ASPART 100 UNIT/ML IJ SOLN: 0.0000 [IU] | Freq: Three times a day (TID) | INTRAMUSCULAR | Status: DC

## 2021-12-27 MED ORDER — INSULIN GLARGINE-YFGN 100 UNIT/ML ~~LOC~~ SOLN
15.0000 [IU] | Freq: Every day | SUBCUTANEOUS | Status: DC
  Administered 2021-12-27 – 2022-01-01 (×6): 15 [IU] via SUBCUTANEOUS
  Filled 2021-12-27 (×8): qty 0.15

## 2021-12-27 MED ORDER — GLUCERNA SHAKE PO LIQD
237.0000 mL | Freq: Three times a day (TID) | ORAL | Status: DC
  Administered 2021-12-27 – 2022-01-01 (×14): 237 mL via ORAL

## 2021-12-27 MED ORDER — METHYLPREDNISOLONE SODIUM SUCC 40 MG IJ SOLR
40.0000 mg | Freq: Every day | INTRAMUSCULAR | Status: DC
  Administered 2021-12-28 – 2022-01-01 (×5): 40 mg via INTRAVENOUS
  Filled 2021-12-27 (×5): qty 1

## 2021-12-27 MED ORDER — INSULIN ASPART 100 UNIT/ML IJ SOLN
0.0000 [IU] | Freq: Every day | INTRAMUSCULAR | Status: DC
  Administered 2021-12-27: 2 [IU] via SUBCUTANEOUS
  Administered 2021-12-28 – 2021-12-31 (×3): 3 [IU] via SUBCUTANEOUS

## 2021-12-27 NOTE — Progress Notes (Signed)
NAME:  Michaela Moon, MRN:  [id number], DOB:  Jan 31, 1936, LOS: 2 ADMISSION DATE:  12/25/2021, CONSULTATION DATE:  12/27/2021  REFERRING MD:  Gwynneth Albright, CHIEF COMPLAINT: Respiratory distress  History of Present Illness:  This is her third admission in the past 2 months for this 86 year old exsmoker with multifocal pneumonia and acute hypoxic respiratory failure. she was hospitalized 9/2 to 9/12 and treated for COVID-19 pneumonia With Paxlovid, Decadron and empiric antibiotic discharge on 2 L She was again hospitalized 10/4 to 10/10 with multifocal pneumonia and treated with Rocephin and azithromycin, weaned down to 5 L nasal cannula  She will get admitted 10/13 to 10/26 for Bilateral infiltrates and severe hypoxia requiring heated high flow nasal cannula-lingular patchy infiltrate dates back to January 2023 and both lower lobe infiltrates appear gradually worse compared to July 2023 on serial CT imaging   -Differential includes chronic aspiration versus inflammatory etiology, doubt malignancy.  Right upper lobe pulmonary nodule appears stable since January 2023  She was treated with high-dose IV Solu-Medrol and diuresis and improved gradually to 5 L nasal cannula on discharge .  Repeat swallow evaluation only showed mild aspiration risk She was discharged on 40 mg of prednisone with a plan to taper by 10 mg every week  She is again admitted for respiratory distress and hypoxia, required BiPAP in the emergency room, transition to high flow nasal cannula. Chest x-ray and CT chest again confirmed bilateral multifocal pneumonia  Pertinent  Medical History  hypertension,  T2DM,  dysphagia requiring dilation 04/2016 for ? web COPD and chronic respiratory failure on 2 L of oxygen   Significant Hospital Events: Including procedures, antibiotic start and stop dates in addition to other pertinent events   CT angiogram chest 12/08/2021 multilobar airspace disease with similar pattern compared to  January 2023.  Lingular opacity appears chronic, lower lobe airspace disease is worse, right upper lobe spiculated nodule unchanged from prior   10/24 swallow evaluation dysphagia 3 diet mild aspiration risk  Interim History / Subjective:   Slightly improved but continues to require 10 L high flow nasal cannula Afebrile   Objective   Blood pressure (!) 135/50, pulse 67, temperature 98.2 F (36.8 C), temperature source Oral, resp. rate 20, height '5\' 5"'$  (1.651 m), weight 79.2 kg, SpO2 100 %.        Intake/Output Summary (Last 24 hours) at 12/27/2021 1515 Last data filed at 12/27/2021 1400 Gross per 24 hour  Intake 1503.8 ml  Output 1300 ml  Net 203.8 ml    Filed Weights   12/25/21 0817 12/25/21 1300 12/26/21 0600  Weight: 80.7 kg 79 kg 79.2 kg    Examination: General: Elderly woman sitting up in bed no distress, family at bedside HENT: Mild pallor, no JVD, no icterus Lungs: Bilateral scattered crackles, no accessory muscle use Cardiovascular: S1-S2 regular, no murmur Abdomen: Soft, nontender no organomegaly Extremities: No edema, no deformity Neuro: Alert, inter active, nonfocal   Lab showed normal electrolytes, mild leukocytosis Urine strep antigen negative Respiratory culture showing GPC in pairs and clusters >>  Resolved Hospital Problem list     Assessment & Plan:  Acute on chronic hypoxic respiratory failure Multi focal pneumonia -Since she was discharged on prednisone, inflammatory etiology seems unlikely.  This is more likely to be recurrent aspiration even though she cleared her swallow evaluation prior to last discharge  -Dysphagia 3 diet with aspiration precautions, doubt that repeat MBS necessary here since she has episodic aspiration -Continue high flow nasal cannula ,  if hypoxia worsens she may need heated high flow -Decrease Solu-Medrol 40 every 24  -Tracheobronchial toilet with flutter valve, vest and Mucinex  Discussed  PEG placement with her son,  admittedly this would be an aggressive approach and severely impact her quality of life.  They may choose to be more compliant with dysphagia diet instead  Best Practice (right click and "Reselect all SmartList Selections" daily)    Code Status:  DNR Last date of multidisciplinary goals of care discussion [Per TR[facility]]  Labs   CBC: Recent Labs  Lab 12/25/21 0823 12/26/21 0302  WBC 25.4* 13.3*  NEUTROABS 22.8*  --   HGB 10.5* 9.1*  HCT 34.0* 29.8*  MCV 86.7 87.9  PLT 155 124*     Basic Metabolic Panel: Recent Labs  Lab 12/25/21 0823 12/26/21 0302  NA 138 137  K 3.4* 4.6  CL 106 106  CO2 27 26  GLUCOSE 30* 263*  BUN 33* 30*  CREATININE 0.54 0.86  CALCIUM 10.5* 10.1    GFR: Estimated Creatinine Clearance: 50.7 mL/min (by C-G formula based on SCr of 0.86 mg/dL). Recent Labs  Lab 12/25/21 0823 12/25/21 1033 12/26/21 0302  PROCALCITON <0.10  --   --   WBC 25.4*  --  13.3*  LATICACIDVEN 1.2 2.3*  --      Liver Function Tests: Recent Labs  Lab 12/26/21 0302  AST 19  ALT 69*  ALKPHOS 38  BILITOT 0.6  PROT 5.8*  ALBUMIN 2.9*    No results for input(s): "LIPASE", "AMYLASE" in the last 168 hours. No results for input(s): "AMMONIA" in the last 168 hours.  ABG    Component Value Date/Time   PHART 7.38 12/25/2021 0943   PCO2ART 36 12/25/2021 0943   PO2ART 66 (L) 12/25/2021 0943   HCO3 22.0 12/25/2021 0943   ACIDBASEDEF 3.1 (H) 12/25/2021 0943   O2SAT 96 12/25/2021 0943     Coagulation Profile: No results for input(s): "INR", "PROTIME" in the last 168 hours.  Cardiac Enzymes: No results for input(s): "CKTOTAL", "CKMB", "CKMBINDEX", "TROPONINI" in the last 168 hours.  HbA1C: Hgb A1c MFr Bld  Date/Time Value Ref Range Status  12/08/2021 04:46 AM 6.3 (H) 4.8 - 5.6 % Final    Comment:    (NOTE) Pre diabetes:          5.7%-6.4%  Diabetes:              >6.4%  Glycemic control for   <7.0% adults with diabetes   10/21/2017 11:18 AM 7.5 (H) 4.8 - 5.6  % Final    Comment:    (NOTE) Pre diabetes:          5.7%-6.4% Diabetes:              >6.4% Glycemic control for   <7.0% adults with diabetes     CBG: Recent Labs  Lab 12/26/21 1118 12/26/21 1613 12/26/21 2008 12/27/21 0723 12/27/21 1108  GLUCAP 191* 314* 279* 232* [place] MD. FCCP. [facility] Pager : [phone]  If no response to pager , please call [phone] until 7 pm After 7:00 pm call Elink  [phone]   12/27/2021

## 2021-12-27 NOTE — Progress Notes (Addendum)
PROGRESS NOTE     Michaela Moon, is a 86 y.o. female, DOB - June 05, 1935, YQM:[id number]  Admit date - 12/25/2021   Admitting Physician Kathie Dike, MD  Outpatient Primary MD for the patient is [name], MD  LOS - 2  Chief Complaint  Patient presents with   Respiratory Distress        Brief Narrative:   86 y.o. female with medical history significant of COPD, diabetes, hypertension, recently diagnosed bilateral lower extremity DVT on anticoagulation admitted on 12/25/2021 with acute on chronic hypoxic respiratory failure in the setting of recurrent aspiration/multifocal pneumonia    -Assessment and Plan: 1)Recurrent Aspiration/Multifocal Pneumonia---pulmonology consult appreciated- -Respiratory culture showing GPC in pairs and clusters  -Continue cefepime, vancomycin and azithromycin,, bronchial dilators and mucolytics  2)Acute on chronic hypoxic respiratory failure----secondary to #1 above -Weaned off continuous BiPAP -Oxygen currently weaned down to 10 L via nasal cannula -PTA patient was on 4 L per nasal cannula -Management as above #1  3)Acute COPD exacerbation--- secondary to #1 above =-Antibiotics as above #1 -Respiratory culture showing GPC in pairs and clusters  -Continue bronchodilators and mucolytic's -IV Solu-Medrol as ordered  4)HTN-continue metoprolol  5)DM2- .  A1c 6.3 reflecting excellent diabetic control PTA -Anticipate worsening hyperglycemia with steroids Use Novolog/Humalog Sliding scale insulin with Accu-Cheks/Fingersticks as ordered   6)GERD--stable, continue PPI especially while on steroids  7)Dysphagia--- recurrent aspiration concerns -Speech pathologist consults appreciated recommends dysphagia 3 (Mech soft);Thin liquid  -Dr. Elsworth Soho discussed possible PEG tube placement with patient's son and patient -Respiratory status currently precludes PEG placement -Patient and son concerned that PEG placement will not improve her quality of  life -She may try to be more compliant with dysphagia diet instead  Disposition/Need for in-Hospital Stay- patient unable to be discharged at this time due to --acute on chronic hypoxic respiratory failure secondary to recurrent aspiration pneumonia requiring IV antibiotics and high flow oxygen   Status is: Inpatient   Disposition: The patient is from: Home              Anticipated d/c is to: Home with [facility]              Anticipated d/c date is: > 3 days              Patient currently is not medically stable to d/c. Barriers: Not Clinically Stable-   Code Status :  -  Code Status: DNR   Family Communication:   Discussed with son at bedside   DVT Prophylaxis  :   - SCDs   apixaban (ELIQUIS) tablet 5 mg   Lab Results  Component Value Date   PLT 124 (L) 12/26/2021    Inpatient Medications  Scheduled Meds:  apixaban  5 mg Oral BID   arformoterol  15 mcg Nebulization BID   aspirin  81 mg Oral Daily   atorvastatin  40 mg Oral Daily   budesonide (PULMICORT) nebulizer solution  0.25 mg Nebulization BID   Chlorhexidine Gluconate Cloth  6 each Topical Daily   gabapentin  100 mg Oral TID   insulin aspart  0-6 Units Subcutaneous TID WC   insulin glargine-yfgn  15 Units Subcutaneous Daily   ipratropium-albuterol  3 mL Nebulization Q6H   [START ON 12/28/2021] methylPREDNISolone (SOLU-MEDROL) injection  40 mg Intravenous Daily   metoprolol tartrate  50 mg Oral BID   montelukast  10 mg Oral QHS   pantoprazole  40 mg Oral Daily   Continuous Infusions:  azithromycin 500 mg (  12/27/21 1703)   ceFEPime (MAXIPIME) IV 2 g (12/27/21 0918)   vancomycin 1,250 mg (12/27/21 1235)   PRN Meds:.acetaminophen **OR** acetaminophen, albuterol, guaiFENesin, ondansetron **OR** ondansetron (ZOFRAN) IV, oxybutynin   Anti-infectives (From admission, onward)    Start     Dose/Rate Route Frequency Ordered Stop   12/26/21 1200  vancomycin (VANCOREADY) IVPB 1250 mg/250 mL        1,250 mg 166.7 mL/hr over  90 Minutes Intravenous Every 24 hours 12/25/21 1134     12/25/21 2200  ceFEPIme (MAXIPIME) 2 g in sodium chloride 0.9 % 100 mL IVPB        2 g 200 mL/hr over 30 Minutes Intravenous Every 12 hours 12/25/21 1250 12/30/21 2159   12/25/21 1430  azithromycin (ZITHROMAX) 500 mg in sodium chloride 0.9 % 250 mL IVPB        500 mg 250 mL/hr over 60 Minutes Intravenous Every 24 hours 12/25/21 1250 12/30/21 1429   12/25/21 1200  vancomycin (VANCOREADY) IVPB 1500 mg/300 mL        1,500 mg 150 mL/hr over 120 Minutes Intravenous  Once 12/25/21 1129 12/25/21 1352   12/25/21 1115  ceFEPIme (MAXIPIME) 2 g in sodium chloride 0.9 % 100 mL IVPB        2 g 200 mL/hr over 30 Minutes Intravenous  Once 12/25/21 1105 12/25/21 1241         Subjective: [name] today has no fevers, no emesis,  No chest pain,   - son and family members traveling are here at bedside -Still requiring high flow oxygen currently down to 10 L via nasal cannula ---cough and dyspnea persist  Objective: Vitals:   12/27/21 1414 12/27/21 1500 12/27/21 1600 12/27/21 1611  BP:  (!) 133/36 106/85   Pulse:  72 74   Resp:  17 13   Temp:    98.4 F (36.9 C)  TempSrc:    Oral  SpO2: 100% 97% 92%   Weight:      Height:        Intake/Output Summary (Last 24 hours) at 12/27/2021 1717 Last data filed at 12/27/2021 1600 Gross per 24 hour  Intake 1268.05 ml  Output 2300 ml  Net -1031.95 ml   Filed Weights   12/25/21 0817 12/25/21 1300 12/26/21 0600  Weight: 80.7 kg 79 kg 79.2 kg    Physical Exam  Gen:- Awake Alert, conversational dyspnea HEENT:- [place].AT, No sclera icterus, Nose-- Shorter 10L/min Neck-Supple Neck,No JVD,.  Lungs-diminished breath sounds, scattered rhonchi and wheezes bilaterally  CV- S1, S2 normal, regular  Abd-  +ve B.Sounds, Abd Soft, No tenderness,    Extremity/Skin:- No  edema, pedal pulses present  Psych-affect is appropriate, oriented x3 Neuro-generalized weakness, no new focal deficits, no  tremors  Data Reviewed: I have personally reviewed following labs and imaging studies  CBC: Recent Labs  Lab 12/25/21 0823 12/26/21 0302  WBC 25.4* 13.3*  NEUTROABS 22.8*  --   HGB 10.5* 9.1*  HCT 34.0* 29.8*  MCV 86.7 87.9  PLT 155 063*   Basic Metabolic Panel: Recent Labs  Lab 12/25/21 0823 12/26/21 0302  NA 138 137  K 3.4* 4.6  CL 106 106  CO2 27 26  GLUCOSE 30* 263*  BUN 33* 30*  CREATININE 0.54 0.86  CALCIUM 10.5* 10.1   GFR: Estimated Creatinine Clearance: 50.7 mL/min (by C-G formula based on SCr of 0.86 mg/dL). Liver Function Tests: Recent Labs  Lab 12/26/21 0302  AST 19  ALT 69*  ALKPHOS 38  BILITOT 0.6  PROT 5.8*  ALBUMIN 2.9*   Recent Results (from the past 240 hour(s))  Culture, blood (routine x 2)     Status: None (Preliminary result)   Collection Time: 12/25/21  8:25 AM   Specimen: Right Antecubital; Blood  Result Value Ref Range Status   Specimen Description RIGHT ANTECUBITAL  Final   Special Requests   Final    BOTTLES DRAWN AEROBIC AND ANAEROBIC Blood Culture adequate volume   Culture   Final    NO GROWTH 2 DAYS Performed at [facility], [address] [place], [place] [address]    Report Status PENDING  Incomplete  Culture, blood (routine x 2)     Status: None (Preliminary result)   Collection Time: 12/25/21  8:40 AM   Specimen: Left Antecubital; Blood  Result Value Ref Range Status   Specimen Description LEFT ANTECUBITAL  Final   Special Requests   Final    BOTTLES DRAWN AEROBIC AND ANAEROBIC Blood Culture adequate volume   Culture   Final    NO GROWTH 2 DAYS Performed at [facility], [address] [place], [place] [address]    Report Status PENDING  Incomplete  SARS Coronavirus 2 by RT PCR (hospital order, performed in [facility] hospital lab) *cepheid single result test* Anterior Nasal Swab     Status: None   Collection Time: 12/25/21  9:08 AM   Specimen: Anterior Nasal Swab  Result Value Ref Range Status   SARS  Coronavirus 2 by RT PCR NEGATIVE NEGATIVE Final    Comment: (NOTE) SARS-CoV-2 target nucleic acids are NOT DETECTED.  The SARS-CoV-2 RNA is generally detectable in upper and lower respiratory specimens during the acute phase of infection. The lowest concentration of SARS-CoV-2 viral copies this assay can detect is 250 copies / mL. A negative result does not preclude SARS-CoV-2 infection and should not be used as the sole basis for treatment or other patient management decisions.  A negative result may occur with improper specimen collection / handling, submission of specimen other than nasopharyngeal swab, presence of viral mutation(s) within the areas targeted by this assay, and inadequate number of viral copies (<250 copies / mL). A negative result must be combined with clinical observations, patient history, and epidemiological information.  Fact Sheet for Patients:   https://www.patel.info/  Fact Sheet for Healthcare Providers: https://hall.com/  This test is not yet approved or  cleared by the [place] FDA and has been authorized for detection and/or diagnosis of SARS-CoV-2 by FDA under an Emergency Use Authorization (EUA).  This EUA will remain in effect (meaning this test can be used) for the duration of the COVID-19 declaration under Section 564(b)(1) of the Act, 21 U.S.C. section 360bbb-3(b)(1), unless the authorization is terminated or revoked sooner.  Performed at [facility], [address] [place], [place] [address]   Culture, blood (Routine X 2) w Reflex to ID Panel     Status: None (Preliminary result)   Collection Time: 12/25/21  1:59 PM   Specimen: BLOOD LEFT HAND  Result Value Ref Range Status   Specimen Description BLOOD LEFT HAND  Final   Special Requests   Final    BOTTLES DRAWN AEROBIC ONLY Blood Culture adequate volume   Culture   Final    NO GROWTH 2 DAYS Performed at [facility], [address],  [place],  [address]    Report Status PENDING  Incomplete  Culture, blood (Routine X 2) w Reflex to ID Panel  Status: None (Preliminary result)   Collection Time: 12/25/21  1:59 PM   Specimen: BLOOD RIGHT HAND  Result Value Ref Range Status   Specimen Description BLOOD RIGHT HAND  Final   Special Requests   Final    BOTTLES DRAWN AEROBIC ONLY Blood Culture results may not be optimal due to an inadequate volume of blood received in culture bottles   Culture   Final    NO GROWTH 2 DAYS Performed at [facility], [address], [place], [place] [address]    Report Status PENDING  Incomplete  Expectorated Sputum Assessment w Gram Stain, Rflx to Resp Cult     Status: None   Collection Time: 12/25/21  7:30 PM   Specimen: Expectorated Sputum  Result Value Ref Range Status   Specimen Description EXPECTORATED SPUTUM  Final   Special Requests NONE  Final   Sputum evaluation   Final    THIS SPECIMEN IS ACCEPTABLE FOR SPUTUM CULTURE Performed at [facility], [address] [place], [place] [address]    Report Status 12/25/2021 FINAL  Final  Culture, Respiratory w Gram Stain     Status: None (Preliminary result)   Collection Time: 12/25/21  7:30 PM  Result Value Ref Range Status   Specimen Description   Final    EXPECTORATED SPUTUM Performed at [facility], [address] [place], [place] [address]    Special Requests   Final    NONE Reflexed from [id number] Performed at [facility], [address] [place], [place] [address]    Gram Stain   Final    MODERATE WBC PRESENT,BOTH PMN AND MONONUCLEAR RARE GRAM POSITIVE COCCI IN PAIRS IN CLUSTERS    Culture   Final    CULTURE REINCUBATED FOR BETTER GROWTH Performed at [facility], [place] [address], [place], [place] [address]    Report Status PENDING  Incomplete    Radiology Studies: No results found.   Scheduled Meds:  apixaban  5 mg Oral BID   arformoterol  15 mcg Nebulization BID   aspirin  81 mg Oral Daily   atorvastatin   40 mg Oral Daily   budesonide (PULMICORT) nebulizer solution  0.25 mg Nebulization BID   Chlorhexidine Gluconate Cloth  6 each Topical Daily   gabapentin  100 mg Oral TID   insulin aspart  0-6 Units Subcutaneous TID WC   insulin glargine-yfgn  15 Units Subcutaneous Daily   ipratropium-albuterol  3 mL Nebulization Q6H   [START ON 12/28/2021] methylPREDNISolone (SOLU-MEDROL) injection  40 mg Intravenous Daily   metoprolol tartrate  50 mg Oral BID   montelukast  10 mg Oral QHS   pantoprazole  40 mg Oral Daily   Continuous Infusions:  azithromycin 500 mg (12/27/21 1703)   ceFEPime (MAXIPIME) IV 2 g (12/27/21 3762)   vancomycin 1,250 mg (12/27/21 1235)     LOS: 2 days    Roxan Hockey M.D on 12/27/2021 at 5:17 PM  Go to [email or web address] - for contact info  [facility] - Office  [phone]  If 7PM-7AM, please contact night-coverage [email or web address] 12/27/2021, 5:17 PM

## 2021-12-27 NOTE — Inpatient Diabetes Management (Signed)
Inpatient Diabetes Program Recommendations  AACE/ADA: New Consensus Statement on Inpatient Glycemic Control   Target Ranges:  Prepandial:   less than 140 mg/dL      Peak postprandial:   less than 180 mg/dL (1-2 hours)      Critically ill patients:  140 - 180 mg/dL    Latest Reference Range & Units 12/26/21 07:16 12/26/21 11:18 12/26/21 16:13 12/26/21 20:08 12/27/21 07:23  Glucose-Capillary 70 - 99 mg/dL 217 (H) 191 (H) 314 (H) 279 (H) 232 (H)   Review of Glycemic Control  Diabetes history: DM2 Outpatient Diabetes medications: Lantus 20 units BID, Janumet 50-1000 mg BID, Amaryl 4 mg daily Current orders for Inpatient glycemic control: Semglee 8 units daily, Novolog 0-6 units TID with meals; Solumedrol 40 mg Q12H   Inpatient Diabetes Program Recommendations:     Insulin: Please consider increasing Semglee to 20 units daily, adding Novolog 0-5 units QHS for bedtime correction. If post prandial glucose is consistently over 180 mg/dl and steroids continued as ordered, please consider ordering Novolog 3 units TID with meals for meal coverage if patient eats at least 50% of meals.  Thanks, Barnie Alderman, RN, MSN, [place] Diabetes Coordinator Inpatient Diabetes Program [phone] (Team Pager from 8am to [place])

## 2021-12-28 DIAGNOSIS — J189 Pneumonia, unspecified organism: Secondary | ICD-10-CM | POA: Diagnosis not present

## 2021-12-28 DIAGNOSIS — J9601 Acute respiratory failure with hypoxia: Secondary | ICD-10-CM | POA: Diagnosis not present

## 2021-12-28 LAB — BASIC METABOLIC PANEL
Anion gap: 7 (ref 5–15)
BUN: 26 mg/dL — ABNORMAL HIGH (ref 8–23)
CO2: 24 mmol/L (ref 22–32)
Calcium: 10 mg/dL (ref 8.9–10.3)
Chloride: 107 mmol/L (ref 98–111)
Creatinine, Ser: 0.54 mg/dL (ref 0.44–1.00)
GFR, Estimated: >60 mL/min (ref >60)
Glucose, Bld: 186 mg/dL — ABNORMAL HIGH (ref 70–99)
Potassium: 3.8 mmol/L (ref 3.5–5.1)
Sodium: 138 mmol/L (ref 135–145)

## 2021-12-28 LAB — GLUCOSE, CAPILLARY
Glucose-Capillary: 163 mg/dL — ABNORMAL HIGH (ref 70–99)
Glucose-Capillary: 334 mg/dL — ABNORMAL HIGH (ref 70–99)
Glucose-Capillary: 433 mg/dL — ABNORMAL HIGH (ref 70–99)
Glucose-Capillary: 254 mg/dL — ABNORMAL HIGH (ref 70–99)

## 2021-12-28 LAB — CULTURE, RESPIRATORY W GRAM STAIN

## 2021-12-28 LAB — VANCOMYCIN, TROUGH: Vancomycin Tr: 7 ug/mL — ABNORMAL LOW (ref 15–20)

## 2021-12-28 MED ORDER — BISACODYL 10 MG RE SUPP
10.0000 mg | Freq: Once | RECTAL | Status: AC
  Administered 2021-12-28: 10 mg via RECTAL
  Filled 2021-12-28: qty 1

## 2021-12-28 MED ORDER — VANCOMYCIN HCL 750 MG/150ML IV SOLN
750.0000 mg | Freq: Two times a day (BID) | INTRAVENOUS | Status: AC
  Administered 2021-12-29 – 2021-12-30 (×4): 750 mg via INTRAVENOUS
  Filled 2021-12-28 (×4): qty 150

## 2021-12-28 MED ORDER — METOPROLOL TARTRATE 50 MG PO TABS
75.0000 mg | ORAL_TABLET | Freq: Two times a day (BID) | ORAL | Status: DC
  Administered 2021-12-28 – 2022-01-01 (×8): 75 mg via ORAL
  Filled 2021-12-28 (×8): qty 1

## 2021-12-28 MED ORDER — POLYETHYLENE GLYCOL 3350 17 G PO PACK
17.0000 g | PACK | Freq: Two times a day (BID) | ORAL | Status: DC
  Administered 2021-12-28 – 2022-01-01 (×7): 17 g via ORAL
  Filled 2021-12-28 (×8): qty 1

## 2021-12-28 MED ORDER — METOPROLOL TARTRATE 25 MG PO TABS
25.0000 mg | ORAL_TABLET | Freq: Once | ORAL | Status: AC
  Administered 2021-12-28: 25 mg via ORAL
  Filled 2021-12-28: qty 1

## 2021-12-28 MED ORDER — INSULIN ASPART 100 UNIT/ML IJ SOLN
0.0000 [IU] | Freq: Three times a day (TID) | INTRAMUSCULAR | Status: DC
  Administered 2021-12-28: 15 [IU] via SUBCUTANEOUS
  Administered 2021-12-28: 11 [IU] via SUBCUTANEOUS
  Administered 2021-12-29: 2 [IU] via SUBCUTANEOUS
  Administered 2021-12-29: 5 [IU] via SUBCUTANEOUS
  Administered 2021-12-29: 8 [IU] via SUBCUTANEOUS
  Administered 2021-12-30: 11 [IU] via SUBCUTANEOUS
  Administered 2021-12-30: 8 [IU] via SUBCUTANEOUS
  Administered 2021-12-30: 2 [IU] via SUBCUTANEOUS
  Administered 2021-12-31: 5 [IU] via SUBCUTANEOUS
  Administered 2021-12-31: 2 [IU] via SUBCUTANEOUS
  Administered 2021-12-31: 11 [IU] via SUBCUTANEOUS
  Administered 2022-01-01: 8 [IU] via SUBCUTANEOUS
  Administered 2022-01-01: 11 [IU] via SUBCUTANEOUS
  Administered 2022-01-01: 3 [IU] via SUBCUTANEOUS

## 2021-12-28 NOTE — Progress Notes (Signed)
  Transition of Care [facility]) Screening Note   Patient Details  Name: Michaela Moon Date of Birth: 08-18-1935   Transition of Care [facility]) CM/SW Contact:    Ihor Gully, LCSW Phone Number: 12/28/2021, 4:11 PM    Transition of Care Department [facility]) has reviewed patient and no TOC needs have been identified at this time. We will continue to monitor patient advancement through interdisciplinary progression rounds. If new patient transition needs arise, please place a TOC consult.

## 2021-12-28 NOTE — Progress Notes (Signed)
Patient sat in bed side chair from 0900 to after 1300 with legs elevated. RT has turned her down to 8L sats WNL's. No c/o pain, nausea or SOB. Has been snoozing and watching TV since she has gone back to bed.

## 2021-12-28 NOTE — Inpatient Diabetes Management (Addendum)
Inpatient Diabetes Program Recommendations  AACE/ADA: New Consensus Statement on Inpatient Glycemic Control   Target Ranges:  Prepandial:   less than 140 mg/dL      Peak postprandial:   less than 180 mg/dL (1-2 hours)      Critically ill patients:  140 - 180 mg/dL    Latest Reference Range & Units 12/27/21 07:23 12/27/21 11:08 12/27/21 16:11 12/27/21 20:02 12/28/21 08:28  Glucose-Capillary 70 - 99 mg/dL 232 (H) 249 (H) 346 (H) 231 (H) 163 (H)   Review of Glycemic Control  Diabetes history: DM2 Outpatient Diabetes medications: Lantus 20 units BID, Janumet 50-1000 mg BID, Amaryl 4 mg daily Current orders for Inpatient glycemic control: Semglee 15 units daily, Novolog 0-6 units TID with meals; Solumedrol 40 mg daily   Inpatient Diabetes Program Recommendations:    Insulin: If steroids are continued as ordered, please consider ordering Novolog 4 units TID with meals for meal coverage if patient eats at least 50% of meals.   Thanks, Barnie Alderman, RN, MSN, [place] Diabetes Coordinator Inpatient Diabetes Program [phone] (Team Pager from 8am to [place])

## 2021-12-28 NOTE — Care Management Important Message (Signed)
Important Message  Patient Details  Name: Michaela Moon MRN: [id number] Date of Birth: 1935/05/29   Medicare Important Message Given:  Yes     Tommy Medal 12/28/2021, 12:42 PM

## 2021-12-28 NOTE — Progress Notes (Signed)
Speech Language Pathology Treatment: Dysphagia  Patient Details Name: Michaela Moon MRN: [id number] DOB: [birth year] Today's Date: 12/28/2021 Time: [id number] SLP Time Calculation (min) (ACUTE ONLY): 21 min  Assessment / Plan / Recommendation Clinical Impression  Ongoing diagnostic dysphagia therapy provided; Pt continues to present with wet vocal quality prior to any trials provided. Pt is on 10L/mn currently. Pt took sips of thin liquids without overt s/sx of aspiration, however discussed POC with MD and at this time recommend downgrade to conservative diet of D2/NTL. Pt continues to have occasional choking episodes per son and with inconsistent respiratory status question if this more conservative diet will decrease episodic aspiration Pt seems to be experiencing. Again, cannot r/o postprandial aspiration related to reflux. This diet adjustment may or may not be of benefit but it is more conservative and could questionably decrease Pt's aspiration risk and/or aspiration episodes. Above to RN, MD, Pt and [name].    HPI HPI: Michaela Moon is a 86 y.o. female with medical history significant of COPD, diabetes, hypertension, recently diagnosed bilateral lower extremity DVT on anticoagulation, who was unfortunately who has had multiple hospitalizations since September of this year when she was diagnosed with COVID.  She was treated with antivirals and steroids at the time of her diagnosis in September at [facility] and was discharged home with oxygen on 2 L.  Unfortunately, since that time she has had 2 admissions to [facility] with multifocal pneumonia.  She was treated with ceftriaxone and azithromycin on both occasions.  She was also treated with steroids and bronchodilators.  She was discharged home on 4 L of oxygen at the time of discharge.  After her last discharge, she reports that she was initially feeling well and did not have any shortness of breath.  She does have a chronic cough  which she feels has not really changed.  Her son noted that she felt unwell last night, but her respiratory status appears to be near baseline.  This morning, he had noted her in bed and checked a pulse ox on her and it was noted to be in the 80s despite wearing 4 L of oxygen.  He proceeded to give her a breathing treatment which transiently improved her oxygen saturations into the 90s, but then subsequently began to drop back down to the 80s.  EMS was called and placed her on nonrebreather mask on their arrival.  Despite this, her oxygen saturations remained in the 80s.  On arrival to the emergency room, she was initially placed on BiPAP but did not tolerate this very well.  She was subsequently transitioned to high flow nasal cannula which appears to have had a better effect within oxygen saturations in the 90s.  She was profoundly hypoglycemic with blood sugar in the 20s to 30s and received dextrose. BSE was requested      SLP Plan  Continue with current plan of care      Recommendations for follow up therapy are one component of a multi-disciplinary discharge planning process, led by the attending physician.  Recommendations may be updated based on patient status, additional functional criteria and insurance authorization.    Recommendations  Medication Administration: Whole meds with liquid Compensations: Slow rate Postural Changes and/or Swallow Maneuvers: Seated upright 90 degrees;Upright 30-60 min after meal                Oral Care Recommendations: Oral care BID;Staff/trained caregiver to provide oral care Follow Up Recommendations: Follow physician's recommendations for  discharge plan and follow up therapies Assistance recommended at discharge: Intermittent Supervision/Assistance SLP Visit Diagnosis: Dysphagia, unspecified (R13.10) Plan: Continue with current plan of care         Kalmen Lollar H. Roddie Mc, CCC-SLP Speech Language Pathologist   [name]  12/28/2021, 10:51 AM

## 2021-12-28 NOTE — Progress Notes (Addendum)
PROGRESS NOTE     Michaela Moon, is a 86 y.o. female, DOB - May 29, 1935, XJO:[id number]  Admit date - 12/25/2021   Admitting Physician Kathie Dike, MD  Outpatient Primary MD for the patient is [name], MD  LOS - 3  Chief Complaint  Patient presents with   Respiratory Distress        Brief Narrative:   86 y.o. female with medical history significant of COPD, diabetes, hypertension, recently diagnosed bilateral lower extremity DVT on anticoagulation admitted on 12/25/2021 with acute on chronic hypoxic respiratory failure in the setting of recurrent aspiration/multifocal pneumonia    -Assessment and Plan: 1)Recurrent Aspiration/Multifocal Pneumonia---pulmonology consult appreciated- -Respiratory culture showing GPC in pairs and clusters  -Continue cefepime, vancomycin and azithromycin,, bronchial dilators and mucolytics 12/28/21 -Respiratory status remains tenuous with high flow oxygen requirement  2)Acute on chronic hypoxic respiratory failure----secondary to #1 above -Weaned off continuous BiPAP -Oxygen currently weaned down to 10 L via nasal cannula -PTA patient was on 4 L per nasal cannula -Management as above #1 -May use BiPAP nightly as needed  3)Acute COPD exacerbation--- secondary to #1 above =-Antibiotics as above #1 -Respiratory culture showing GPC in pairs and clusters  -Continue bronchodilators and mucolytic's -IV Solu-Medrol as ordered  4)HTN-increase metoprolol to 75 mg twice daily to improve rate control and BP control   5)DM2- .  A1c 6.3 reflecting excellent diabetic control PTA -Anticipate worsening hyperglycemia with steroids Use Novolog/Humalog Sliding scale insulin with Accu-Cheks/Fingersticks as ordered   6)GERD--stable, continue PPI especially while on steroids  7)Dysphagia--- recurrent aspiration concerns -Speech pathologist consults appreciated recommends dysphagia 2 (Mech soft); Nectar thickened liquids -Dr. Elsworth Soho discussed possible  PEG tube placement with patient's son and patient -Respiratory status currently precludes PEG placement -Patient and son concerned that PEG placement will not improve her quality of life -She may try to be more compliant with dysphagia diet instead ???  If patient will benefit from nectar thickened liquids despite negative MBS study  8)Bil DVT-lower extremity venous Dopplers from 12/08/2021 noted -CTA angio chest from 12/08/2021 and repeat CTA chest 12/25/2021 without acute PE -Continue Eliquis twice daily  Disposition/Need for in-Hospital Stay- patient unable to be discharged at this time due to --acute on chronic hypoxic respiratory failure secondary to recurrent aspiration pneumonia requiring IV antibiotics and high flow oxygen   Status is: Inpatient   Disposition: The patient is from: Home              Anticipated d/c is to: Home with [facility]              Anticipated d/c date is: > 3 days              Patient currently is not medically stable to d/c. Barriers: Not Clinically Stable-   Code Status :  -  Code Status: DNR   Family Communication:   Discussed with son at bedside   DVT Prophylaxis  :   - SCDs   apixaban (ELIQUIS) tablet 5 mg   Lab Results  Component Value Date   PLT 124 (L) 12/26/2021   Inpatient Medications  Scheduled Meds:  apixaban  5 mg Oral BID   arformoterol  15 mcg Nebulization BID   aspirin  81 mg Oral Daily   atorvastatin  40 mg Oral Daily   budesonide (PULMICORT) nebulizer solution  0.25 mg Nebulization BID   Chlorhexidine Gluconate Cloth  6 each Topical Daily   feeding supplement (GLUCERNA SHAKE)  237 mL Oral TID BM  gabapentin  100 mg Oral TID   insulin aspart  0-15 Units Subcutaneous TID WC   insulin aspart  0-5 Units Subcutaneous QHS   insulin glargine-yfgn  15 Units Subcutaneous Daily   ipratropium-albuterol  3 mL Nebulization Q6H   methylPREDNISolone (SOLU-MEDROL) injection  40 mg Intravenous Daily   metoprolol tartrate  50 mg Oral BID    montelukast  10 mg Oral QHS   pantoprazole  40 mg Oral Daily   Continuous Infusions:  azithromycin 500 mg (12/27/21 1703)   ceFEPime (MAXIPIME) IV 2 g (12/28/21 0931)   vancomycin 1,250 mg (12/27/21 1235)   PRN Meds:.acetaminophen **OR** acetaminophen, albuterol, guaiFENesin, ondansetron **OR** ondansetron (ZOFRAN) IV, oxybutynin   Anti-infectives (From admission, onward)    Start     Dose/Rate Route Frequency Ordered Stop   12/26/21 1200  vancomycin (VANCOREADY) IVPB 1250 mg/250 mL        1,250 mg 166.7 mL/hr over 90 Minutes Intravenous Every 24 hours 12/25/21 1134     12/25/21 2200  ceFEPIme (MAXIPIME) 2 g in sodium chloride 0.9 % 100 mL IVPB        2 g 200 mL/hr over 30 Minutes Intravenous Every 12 hours 12/25/21 1250 12/30/21 2159   12/25/21 1430  azithromycin (ZITHROMAX) 500 mg in sodium chloride 0.9 % 250 mL IVPB        500 mg 250 mL/hr over 60 Minutes Intravenous Every 24 hours 12/25/21 1250 12/30/21 1429   12/25/21 1200  vancomycin (VANCOREADY) IVPB 1500 mg/300 mL        1,500 mg 150 mL/hr over 120 Minutes Intravenous  Once 12/25/21 1129 12/25/21 1352   12/25/21 1115  ceFEPIme (MAXIPIME) 2 g in sodium chloride 0.9 % 100 mL IVPB        2 g 200 mL/hr over 30 Minutes Intravenous  Once 12/25/21 1105 12/25/21 1241        Subjective: [name] today has no fevers, no emesis,  No chest pain,    12/28/21 - son at bedside -Attempted to wean down oxygen but unable ,currently requiring 10 L via nasal cannula ---cough and dyspnea persist  Objective: Vitals:   12/28/21 0409 12/28/21 0500 12/28/21 0600 12/28/21 0812  BP:  (!) 115/42 (!) 137/39   Pulse:  (!) 57 (!) 57   Resp:  11 10   Temp: 98.4 F (36.9 C)     TempSrc: Oral     SpO2:  94% 96% 94%  Weight:      Height:        Intake/Output Summary (Last 24 hours) at 12/28/2021 1034 Last data filed at 12/28/2021 0409 Gross per 24 hour  Intake 573.88 ml  Output 1550 ml  Net -976.12 ml   Filed Weights   12/25/21  0817 12/25/21 1300 12/26/21 0600  Weight: 80.7 kg 79 kg 79.2 kg    Physical Exam  Gen:- Awake Alert, conversational dyspnea HEENT:- [place].AT, No sclera icterus, Nose-- [place] 10L/min Neck-Supple Neck,No JVD,.  Lungs-diminished breath sounds, scattered rhonchi and wheezes bilaterally  CV- S1, S2 normal, regular , tachycardic Abd-  +ve B.Sounds, Abd Soft, No tenderness,    Extremity/Skin:- No  edema, pedal pulses present  Psych-affect is appropriate, oriented x3 Neuro-generalized weakness, no new focal deficits, no tremors  Data Reviewed: I have personally reviewed following labs and imaging studies  CBC: Recent Labs  Lab 12/25/21 0823 12/26/21 0302  WBC 25.4* 13.3*  NEUTROABS 22.8*  --   HGB 10.5* 9.1*  HCT 34.0* 29.8*  MCV 86.7  87.9  PLT 155 378*   Basic Metabolic Panel: Recent Labs  Lab 12/25/21 0823 12/26/21 0302 12/28/21 0258  NA 138 137 138  K 3.4* 4.6 3.8  CL 106 106 107  CO2 '27 26 24  '$ GLUCOSE 30* 263* 186*  BUN 33* 30* 26*  CREATININE 0.54 0.86 0.54  CALCIUM 10.5* 10.1 10.0   GFR: Estimated Creatinine Clearance: 54.5 mL/min (by C-G formula based on SCr of 0.54 mg/dL). Liver Function Tests: Recent Labs  Lab 12/26/21 0302  AST 19  ALT 69*  ALKPHOS 38  BILITOT 0.6  PROT 5.8*  ALBUMIN 2.9*   Recent Results (from the past 240 hour(s))  Culture, blood (routine x 2)     Status: None (Preliminary result)   Collection Time: 12/25/21  8:25 AM   Specimen: Right Antecubital; Blood  Result Value Ref Range Status   Specimen Description RIGHT ANTECUBITAL  Final   Special Requests   Final    BOTTLES DRAWN AEROBIC AND ANAEROBIC Blood Culture adequate volume   Culture   Final    NO GROWTH 3 DAYS Performed at [facility], [address] [place], [place] [address]    Report Status PENDING  Incomplete  Culture, blood (routine x 2)     Status: None (Preliminary result)   Collection Time: 12/25/21  8:40 AM   Specimen: Left Antecubital; Blood  Result Value Ref Range  Status   Specimen Description LEFT ANTECUBITAL  Final   Special Requests   Final    BOTTLES DRAWN AEROBIC AND ANAEROBIC Blood Culture adequate volume   Culture   Final    NO GROWTH 3 DAYS Performed at [facility], [address] [place], [place] [address]    Report Status PENDING  Incomplete  SARS Coronavirus 2 by RT PCR (hospital order, performed in [place] hospital lab) *cepheid single result test* Anterior Nasal Swab     Status: None   Collection Time: 12/25/21  9:08 AM   Specimen: Anterior Nasal Swab  Result Value Ref Range Status   SARS Coronavirus 2 by RT PCR NEGATIVE NEGATIVE Final    Comment: (NOTE) SARS-CoV-2 target nucleic acids are NOT DETECTED.  The SARS-CoV-2 RNA is generally detectable in upper and lower respiratory specimens during the acute phase of infection. The lowest concentration of SARS-CoV-2 viral copies this assay can detect is 250 copies / mL. A negative result does not preclude SARS-CoV-2 infection and should not be used as the sole basis for treatment or other patient management decisions.  A negative result may occur with improper specimen collection / handling, submission of specimen other than nasopharyngeal swab, presence of viral mutation(s) within the areas targeted by this assay, and inadequate number of viral copies (<250 copies / mL). A negative result must be combined with clinical observations, patient history, and epidemiological information.  Fact Sheet for Patients:   https://www.patel.info/  Fact Sheet for Healthcare Providers: https://hall.com/  This test is not yet approved or  cleared by the [place] FDA and has been authorized for detection and/or diagnosis of SARS-CoV-2 by FDA under an Emergency Use Authorization (EUA).  This EUA will remain in effect (meaning this test can be used) for the duration of the COVID-19 declaration under Section 564(b)(1) of the Act, 21 U.S.C. section  360bbb-3(b)(1), unless the authorization is terminated or revoked sooner.  Performed at [facility], [address], [place], [place] [address]   Culture, blood (Routine X 2) w Reflex to ID Panel     Status:  None (Preliminary result)   Collection Time: 12/25/21  1:59 PM   Specimen: BLOOD LEFT HAND  Result Value Ref Range Status   Specimen Description BLOOD LEFT HAND  Final   Special Requests   Final    BOTTLES DRAWN AEROBIC ONLY Blood Culture adequate volume   Culture   Final    NO GROWTH 3 DAYS Performed at [facility], [address], [place], [place] [address]    Report Status PENDING  Incomplete  Culture, blood (Routine X 2) w Reflex to ID Panel     Status: None (Preliminary result)   Collection Time: 12/25/21  1:59 PM   Specimen: BLOOD RIGHT HAND  Result Value Ref Range Status   Specimen Description BLOOD RIGHT HAND  Final   Special Requests   Final    BOTTLES DRAWN AEROBIC ONLY Blood Culture results may not be optimal due to an inadequate volume of blood received in culture bottles   Culture   Final    NO GROWTH 3 DAYS Performed at [facility], [address], [place], [place] [address]    Report Status PENDING  Incomplete  Expectorated Sputum Assessment w Gram Stain, Rflx to Resp Cult     Status: None   Collection Time: 12/25/21  7:30 PM   Specimen: Expectorated Sputum  Result Value Ref Range Status   Specimen Description EXPECTORATED SPUTUM  Final   Special Requests NONE  Final   Sputum evaluation   Final    THIS SPECIMEN IS ACCEPTABLE FOR SPUTUM CULTURE Performed at [facility], [address] [place], [place] [address]    Report Status 12/25/2021 FINAL  Final  Culture, Respiratory w Gram Stain     Status: None (Preliminary result)   Collection Time: 12/25/21  7:30 PM  Result Value Ref Range Status   Specimen Description   Final    EXPECTORATED SPUTUM Performed at [facility], [address], [place], [place] [address]    Special Requests   Final    NONE  Reflexed from [id number] Performed at [facility], [address], [place], [place] [address]    Gram Stain   Final    MODERATE WBC PRESENT,BOTH PMN AND MONONUCLEAR RARE GRAM POSITIVE COCCI IN PAIRS IN CLUSTERS    Culture   Final    CULTURE REINCUBATED FOR BETTER GROWTH Performed at [facility], [place] [address] [place], [place] [address]    Report Status PENDING  Incomplete    Radiology Studies:  Scheduled Meds:  apixaban  5 mg Oral BID   arformoterol  15 mcg Nebulization BID   aspirin  81 mg Oral Daily   atorvastatin  40 mg Oral Daily   budesonide (PULMICORT) nebulizer solution  0.25 mg Nebulization BID   Chlorhexidine Gluconate Cloth  6 each Topical Daily   feeding supplement (GLUCERNA SHAKE)  237 mL Oral TID BM   gabapentin  100 mg Oral TID   insulin aspart  0-15 Units Subcutaneous TID WC   insulin aspart  0-5 Units Subcutaneous QHS   insulin glargine-yfgn  15 Units Subcutaneous Daily   ipratropium-albuterol  3 mL Nebulization Q6H   methylPREDNISolone (SOLU-MEDROL) injection  40 mg Intravenous Daily   metoprolol tartrate  50 mg Oral BID   montelukast  10 mg Oral QHS   pantoprazole  40 mg Oral Daily   Continuous Infusions:  azithromycin 500 mg (12/27/21 1703)   ceFEPime (MAXIPIME) IV 2 g (12/28/21 0931)   vancomycin 1,250 mg (12/27/21 1235)  LOS: 3 days   Roxan Hockey M.D on 12/28/2021 at 10:34 AM  Go to [email or web address] - for contact info  [facility] - Office  [phone]  If 7PM-7AM, please contact night-coverage [email or web address] 12/28/2021, 10:34 AM

## 2021-12-28 NOTE — Progress Notes (Signed)
Patient placed on full face bipap 10/5 40% and tolerating well at this time.

## 2021-12-28 NOTE — Progress Notes (Signed)
NAME:  Michaela Moon, MRN:  [id number], DOB:  1935/10/13, LOS: 3 ADMISSION DATE:  12/25/2021, CONSULTATION DATE:  12/28/2021  REFERRING MD:  Gwynneth Albright, CHIEF COMPLAINT: Respiratory distress  History of Present Illness:  This is her third admission in the past 2 months for this 86 year old exsmoker with multifocal pneumonia and acute hypoxic respiratory failure. she was hospitalized 9/2 to 9/12 and treated for COVID-19 pneumonia With Paxlovid, Decadron and empiric antibiotic discharge on 2 L She was again hospitalized 10/4 to 10/10 with multifocal pneumonia and treated with Rocephin and azithromycin, weaned down to 5 L nasal cannula  She will get admitted 10/13 to 10/26 for Bilateral infiltrates and severe hypoxia requiring heated high flow nasal cannula-lingular patchy infiltrate dates back to January 2023 and both lower lobe infiltrates appear gradually worse compared to July 2023 on serial CT imaging   -Differential includes chronic aspiration versus inflammatory etiology, doubt malignancy.  Right upper lobe pulmonary nodule appears stable since January 2023  She was treated with high-dose IV Solu-Medrol and diuresis and improved gradually to 5 L nasal cannula on discharge .  Repeat swallow evaluation only showed mild aspiration risk She was discharged on 40 mg of prednisone with a plan to taper by 10 mg every week  She is again admitted for respiratory distress and hypoxia, required BiPAP in the emergency room, transition to high flow nasal cannula. Chest x-ray and CT chest again confirmed bilateral multifocal pneumonia  Pertinent  Medical History  hypertension,  T2DM,  dysphagia requiring dilation 04/2016 for ? web COPD and chronic respiratory failure on 2 L of oxygen   Significant Hospital Events: Including procedures, antibiotic start and stop dates in addition to other pertinent events   CT angiogram chest 12/08/2021 multilobar airspace disease with similar pattern compared to  January 2023.  Lingular opacity appears chronic, lower lobe airspace disease is worse, right upper lobe spiculated nodule unchanged from prior   10/24 swallow evaluation dysphagia 3 diet mild aspiration risk  Interim History / Subjective:    Remains critically ill on high flow nasal cannula between 8 to 10 L Afebrile Good urine output 1.5 L   Objective   Blood pressure (!) 137/39, pulse 74, temperature 98.4 F (36.9 C), temperature source Oral, resp. rate 10, height '5\' 5"'$  (1.651 m), weight 79.2 kg, SpO2 94 %.        Intake/Output Summary (Last 24 hours) at 12/28/2021 1349 Last data filed at 12/28/2021 0409 Gross per 24 hour  Intake 473.88 ml  Output 1550 ml  Net -1076.12 ml    Filed Weights   12/25/21 0817 12/25/21 1300 12/26/21 0600  Weight: 80.7 kg 79 kg 79.2 kg    Examination: General: Elderly woman sitting up in chair no distress, son at bedside HENT: Mild pallor, no JVD, no icterus Lungs: No accessory muscle use, bilateral scattered crackles Cardiovascular: S1-S2 regular, no murmur Abdomen: Soft, nontender no organomegaly Extremities: No edema, no deformity Neuro: Alert, inter active, nonfocal   Lab showed normal electrolytes Urine strep antigen negative Respiratory culture showing GPC in pairs and clusters >>  Resolved Hospital Problem list     Assessment & Plan:  Acute on chronic hypoxic respiratory failure Multi focal pneumonia -Since she was discharged on prednisone, inflammatory etiology seems unlikely.  This is more likely to be recurrent aspiration even though she cleared her swallow evaluation prior to last discharge  -Dysphagia 3 diet with aspiration precautions, doubt that repeat MBS necessary here since she has episodic aspiration, patient  and son have decided against PEG.  We may have to take a conservative approach and keep her on a dysphagia diet on discharge -Continue high flow nasal cannula  -Decrease Solu-Medrol 40 every 24 and can taper to  off -Tracheobronchial toilet with flutter valve, vest and Mucinex  PCCM will be available as needed  Best Practice (right click and "Reselect all SmartList Selections" daily)    Code Status:  DNR Last date of multidisciplinary goals of care discussion [Per TRH]  Labs   CBC: Recent Labs  Lab 12/25/21 0823 12/26/21 0302  WBC 25.4* 13.3*  NEUTROABS 22.8*  --   HGB 10.5* 9.1*  HCT 34.0* 29.8*  MCV 86.7 87.9  PLT 155 124*     Basic Metabolic Panel: Recent Labs  Lab 12/25/21 0823 12/26/21 0302 12/28/21 0258  NA 138 137 138  K 3.4* 4.6 3.8  CL 106 106 107  CO2 '27 26 24  '$ GLUCOSE 30* 263* 186*  BUN 33* 30* 26*  CREATININE 0.54 0.86 0.54  CALCIUM 10.5* 10.1 10.0    GFR: Estimated Creatinine Clearance: 54.5 mL/min (by C-G formula based on SCr of 0.54 mg/dL). Recent Labs  Lab 12/25/21 0823 12/25/21 1033 12/26/21 0302  PROCALCITON <0.10  --   --   WBC 25.4*  --  13.3*  LATICACIDVEN 1.2 2.3*  --      Liver Function Tests: Recent Labs  Lab 12/26/21 0302  AST 19  ALT 69*  ALKPHOS 38  BILITOT 0.6  PROT 5.8*  ALBUMIN 2.9*    No results for input(s): "LIPASE", "AMYLASE" in the last 168 hours. No results for input(s): "AMMONIA" in the last 168 hours.  ABG    Component Value Date/Time   PHART 7.38 12/25/2021 0943   PCO2ART 36 12/25/2021 0943   PO2ART 66 (L) 12/25/2021 0943   HCO3 22.0 12/25/2021 0943   ACIDBASEDEF 3.1 (H) 12/25/2021 0943   O2SAT 96 12/25/2021 0943     Coagulation Profile: No results for input(s): "INR", "PROTIME" in the last 168 hours.  Cardiac Enzymes: No results for input(s): "CKTOTAL", "CKMB", "CKMBINDEX", "TROPONINI" in the last 168 hours.  HbA1C: Hgb A1c MFr Bld  Date/Time Value Ref Range Status  12/08/2021 04:46 AM 6.3 (H) 4.8 - 5.6 % Final    Comment:    (NOTE) Pre diabetes:          5.7%-6.4%  Diabetes:              >6.4%  Glycemic control for   <7.0% adults with diabetes   10/21/2017 11:18 AM 7.5 (H) 4.8 - 5.6 %  Final    Comment:    (NOTE) Pre diabetes:          5.7%-6.4% Diabetes:              >6.4% Glycemic control for   <7.0% adults with diabetes     CBG: Recent Labs  Lab 12/27/21 1108 12/27/21 1611 12/27/21 2002 12/28/21 0828 12/28/21 1116  GLUCAP 249* 346* 231* Quincy MD. FCCP. [facility] Pager : [phone]  If no response to pager , please call [phone] until 7 pm After 7:00 pm call Elink  [phone]   12/28/2021

## 2021-12-29 ENCOUNTER — Inpatient Hospital Stay (HOSPITAL_COMMUNITY): Payer: Medicare Other

## 2021-12-29 DIAGNOSIS — J189 Pneumonia, unspecified organism: Secondary | ICD-10-CM | POA: Diagnosis not present

## 2021-12-29 LAB — CBC
HCT: 29 % — ABNORMAL LOW (ref 36.0–46.0)
Hemoglobin: 8.9 g/dL — ABNORMAL LOW (ref 12.0–15.0)
MCH: 27.1 pg (ref 26.0–34.0)
MCHC: 31 g/dL (ref 30.0–36.0)
MCV: 87.3 fL (ref 80.0–100.0)
Platelets: 112 10*3/uL — ABNORMAL LOW (ref 150–400)
RBC: 3.32 MIL/uL — ABNORMAL LOW (ref 3.87–5.11)
WBC: 10.2 10*3/uL (ref 4.0–10.5)
nRBC: 0 % (ref 0.0–0.2)

## 2021-12-29 LAB — GLUCOSE, CAPILLARY
Glucose-Capillary: 141 mg/dL — ABNORMAL HIGH (ref 70–99)
Glucose-Capillary: 202 mg/dL — ABNORMAL HIGH (ref 70–99)
Glucose-Capillary: 275 mg/dL — ABNORMAL HIGH (ref 70–99)
Glucose-Capillary: 247 mg/dL — ABNORMAL HIGH (ref 70–99)
Glucose-Capillary: 188 mg/dL — ABNORMAL HIGH (ref 70–99)

## 2021-12-29 MED ORDER — LACTULOSE 10 GM/15ML PO SOLN
30.0000 g | Freq: Once | ORAL | Status: AC
  Administered 2021-12-29: 30 g via ORAL
  Filled 2021-12-29: qty 60

## 2021-12-29 MED ORDER — FLUCONAZOLE 100 MG PO TABS
200.0000 mg | ORAL_TABLET | Freq: Every day | ORAL | Status: DC
  Administered 2021-12-29 – 2022-01-01 (×4): 200 mg via ORAL
  Filled 2021-12-29 (×4): qty 2

## 2021-12-29 NOTE — Progress Notes (Signed)
Pharmacy Antibiotic Note  Michaela Moon is a 86 y.o. female admitted on 12/25/2021 with pneumonia.  Pharmacy has been consulted for Vancomycin dosing.  11/3 vancomycin trough 7, subtherapeutic  Plan: Vancomycin '750mg'$  IV q 12hrs Monitor renal function, cbc, and culture data F/u continuation of cefepime dosing  Height: '5\' 5"'$  (165.1 cm) Weight: 78.3 kg (172 lb 9.9 oz) IBW/kg (Calculated) : 57 Predicted AUC 494; ke 0.043 ;Vd 58L  Temp (24hrs), Avg:98.3 F (36.8 C), Min:97.8 F (36.6 C), Max:98.6 F (37 C)  Recent Labs  Lab 12/25/21 0823 12/25/21 1033 12/26/21 0302 12/28/21 0258 12/28/21 1135 12/29/21 0334  WBC 25.4*  --  13.3*  --   --  10.2  CREATININE 0.54  --  0.86 0.54  --   --   LATICACIDVEN 1.2 2.3*  --   --   --   --   VANCOTROUGH  --   --   --   --  7*  --      Estimated Creatinine Clearance: 54.1 mL/min (by C-G formula based on SCr of 0.54 mg/dL).    Allergies  Allergen Reactions   Onion     wheezing    Antimicrobials this admission: 10/31 Cefepime >> 10/31 azithromycin >>11/3 10/31 Vanc >>   Microbiology results: 10/31 BCx: NGTD   Thank you for allowing pharmacy to be a part of this patient's care.  Donna Christen Venie Montesinos 12/29/2021 8:32 AM

## 2021-12-29 NOTE — Progress Notes (Signed)
PROGRESS NOTE     Michaela Moon, is a 86 y.o. female, DOB - 1935/07/03, VPX:[id number]  Admit date - 12/25/2021   Admitting Physician Kathie Dike, MD  Outpatient Primary MD for the patient is [name], MD  LOS - 4  Chief Complaint  Patient presents with   Respiratory Distress        Brief Narrative:   86 y.o. female with medical history significant of COPD, diabetes, hypertension, recently diagnosed bilateral lower extremity DVT on anticoagulation admitted on 12/25/2021 with acute on chronic hypoxic respiratory failure in the setting of recurrent aspiration/multifocal pneumonia    -Assessment and Plan: 1)Recurrent Aspiration/Multifocal Pneumonia---pulmonology consult appreciated- -Respiratory sample Gram stain with showing GPC in pairs and clusters as well as Candida albicans -Continue cefepime, vancomycin and azithromycin,, bronchial dilators and mucolytics 12/29/21 -Respiratory status and oxygen requirement improving -Vancomycin trough is subtherapeutic pharmacy to adjust -Chest x-ray on 12/29/2021 consistent with aspiration pneumonia -Add Diflucan  2)Acute on chronic hypoxic respiratory failure----secondary to #1 above -Weaned off continuous BiPAP 12/29/21 -Tolerated BiPAP well overnight--will recommend that she continues to use BiPAP nightly -Oxygen currently weaned down to 5 to 6 L via nasal cannula -PTA patient was on 4 L per nasal cannula -Management as above #1   3)Acute COPD exacerbation--- secondary to #1 above =-Antibiotics as above #1 -Respiratory Gram stain showing GPC in pairs and clusters  -Continue bronchodilators and mucolytic's -c/n steroids as ordered  4)HTN-c/n  metoprolol 75 mg twice daily to improve rate control and BP control   5)DM2- .  A1c 6.3 reflecting excellent diabetic control PTA - worsening hyperglycemia due to steroids Use Novolog/Humalog Sliding scale insulin with Accu-Cheks/Fingersticks as ordered   6)GERD--stable, continue  PPI especially while on steroids  7)Dysphagia--- recurrent aspiration concerns -Speech pathologist consults appreciated recommends dysphagia 2 (Mech soft); Nectar thickened liquids -Dr. Elsworth Soho discussed possible PEG tube placement with patient's son and patient -Respiratory status currently precludes PEG placement -Patient and son concerned that PEG placement will not improve her quality of life -She may try to be more compliant with dysphagia diet instead ???  If patient will benefit from nectar thickened liquids despite negative MBS study  8)Bil DVT-lower extremity venous Dopplers from 12/08/2021 noted -CTA angio chest from 12/08/2021 and repeat CTA chest 12/25/2021 without acute PE -Continue Eliquis twice daily  Disposition/Need for in-Hospital Stay- patient unable to be discharged at this time due to --acute on chronic hypoxic respiratory failure secondary to recurrent aspiration pneumonia requiring IV antibiotics and high flow oxygen  Status is: Inpatient   Disposition: The patient is from: Home              Anticipated d/c is to: Home with [facility]              Anticipated d/c date is: > 3 days              Patient currently is not medically stable to d/c. Barriers: Not Clinically Stable-   Code Status :  -  Code Status: DNR   Family Communication:   Discussed with son at bedside   DVT Prophylaxis  :   - SCDs   apixaban (ELIQUIS) tablet 5 mg   Lab Results  Component Value Date   PLT 112 (L) 12/29/2021   Inpatient Medications  Scheduled Meds:  apixaban  5 mg Oral BID   arformoterol  15 mcg Nebulization BID   aspirin  81 mg Oral Daily   atorvastatin  40 mg Oral Daily  budesonide (PULMICORT) nebulizer solution  0.25 mg Nebulization BID   Chlorhexidine Gluconate Cloth  6 each Topical Daily   feeding supplement (GLUCERNA SHAKE)  237 mL Oral TID BM   gabapentin  100 mg Oral TID   insulin aspart  0-15 Units Subcutaneous TID WC   insulin aspart  0-5 Units Subcutaneous QHS    insulin glargine-yfgn  15 Units Subcutaneous Daily   ipratropium-albuterol  3 mL Nebulization Q6H   lactulose  30 g Oral Once   methylPREDNISolone (SOLU-MEDROL) injection  40 mg Intravenous Daily   metoprolol tartrate  75 mg Oral BID   montelukast  10 mg Oral QHS   pantoprazole  40 mg Oral Daily   polyethylene glycol  17 g Oral BID   Continuous Infusions:  azithromycin Stopped (12/28/21 1531)   ceFEPime (MAXIPIME) IV 2 g (12/29/21 0905)   vancomycin Stopped (12/29/21 0305)   PRN Meds:.acetaminophen **OR** acetaminophen, albuterol, guaiFENesin, ondansetron **OR** ondansetron (ZOFRAN) IV, oxybutynin   Anti-infectives (From admission, onward)    Start     Dose/Rate Route Frequency Ordered Stop   12/29/21 0000  vancomycin (VANCOREADY) IVPB 750 mg/150 mL        750 mg 150 mL/hr over 60 Minutes Intravenous Every 12 hours 12/28/21 1439     12/26/21 1200  vancomycin (VANCOREADY) IVPB 1250 mg/250 mL  Status:  Discontinued        1,250 mg 166.7 mL/hr over 90 Minutes Intravenous Every 24 hours 12/25/21 1134 12/28/21 1439   12/25/21 2200  ceFEPIme (MAXIPIME) 2 g in sodium chloride 0.9 % 100 mL IVPB        2 g 200 mL/hr over 30 Minutes Intravenous Every 12 hours 12/25/21 1250 12/30/21 2159   12/25/21 1430  azithromycin (ZITHROMAX) 500 mg in sodium chloride 0.9 % 250 mL IVPB        500 mg 250 mL/hr over 60 Minutes Intravenous Every 24 hours 12/25/21 1250 12/30/21 1429   12/25/21 1200  vancomycin (VANCOREADY) IVPB 1500 mg/300 mL        1,500 mg 150 mL/hr over 120 Minutes Intravenous  Once 12/25/21 1129 12/25/21 1352   12/25/21 1115  ceFEPIme (MAXIPIME) 2 g in sodium chloride 0.9 % 100 mL IVPB        2 g 200 mL/hr over 30 Minutes Intravenous  Once 12/25/21 1105 12/25/21 1241        Subjective: [name] today has no fevers, no emesis,  No chest pain,    12/29/21 -Complains of constipation - -Tolerated BiPAP well overnight--will recommend that she continues to use BiPAP  nightly -Oxygen currently weaned down to 5 to 6 L via nasal cannula  Objective: Vitals:   12/29/21 0433 12/29/21 0500 12/29/21 0600 12/29/21 0810  BP: 137/72 (!) 140/40 (!) 178/52   Pulse: (!) 59 (!) 59 72   Resp: '12 12 18   '$ Temp: 98.5 F (36.9 C)   97.8 F (36.6 C)  TempSrc: Oral   Oral  SpO2: 94% 92% 96%   Weight: 78.3 kg     Height:        Intake/Output Summary (Last 24 hours) at 12/29/2021 0926 Last data filed at 12/29/2021 0430 Gross per 24 hour  Intake 1861.46 ml  Output 1400 ml  Net 461.46 ml   Filed Weights   12/25/21 1300 12/26/21 0600 12/29/21 0433  Weight: 79 kg 79.2 kg 78.3 kg    Physical Exam  Gen:- Awake Alert, able to speak in short sentences  HEENT:- [place].AT, No  sclera icterus, Nose-- [place] 6L/min Neck-Supple Neck,No JVD,.  Lungs-improving air movement, scattered rhonchi and wheezes bilaterally  CV- S1, S2 normal, regular , tachycardic Abd-  +ve B.Sounds, Abd Soft, No tenderness,    Extremity/Skin:- No  edema, pedal pulses present  Psych-affect is appropriate, oriented x3 Neuro-generalized weakness, no new focal deficits, no tremors  Data Reviewed: I have personally reviewed following labs and imaging studies  CBC: Recent Labs  Lab 12/25/21 0823 12/26/21 0302 12/29/21 0334  WBC 25.4* 13.3* 10.2  NEUTROABS 22.8*  --   --   HGB 10.5* 9.1* 8.9*  HCT 34.0* 29.8* 29.0*  MCV 86.7 87.9 87.3  PLT 155 124* 568*   Basic Metabolic Panel: Recent Labs  Lab 12/25/21 0823 12/26/21 0302 12/28/21 0258  NA 138 137 138  K 3.4* 4.6 3.8  CL 106 106 107  CO2 '27 26 24  '$ GLUCOSE 30* 263* 186*  BUN 33* 30* 26*  CREATININE 0.54 0.86 0.54  CALCIUM 10.5* 10.1 10.0   GFR: Estimated Creatinine Clearance: 54.1 mL/min (by C-G formula based on SCr of 0.54 mg/dL). Liver Function Tests: Recent Labs  Lab 12/26/21 0302  AST 19  ALT 69*  ALKPHOS 38  BILITOT 0.6  PROT 5.8*  ALBUMIN 2.9*   Recent Results (from the past 240 hour(s))  Culture, blood (routine x 2)      Status: None (Preliminary result)   Collection Time: 12/25/21  8:25 AM   Specimen: Right Antecubital; Blood  Result Value Ref Range Status   Specimen Description RIGHT ANTECUBITAL  Final   Special Requests   Final    BOTTLES DRAWN AEROBIC AND ANAEROBIC Blood Culture adequate volume   Culture   Final    NO GROWTH 4 DAYS Performed at [facility], [address] [place], [place] [address]    Report Status PENDING  Incomplete  Culture, blood (routine x 2)     Status: None (Preliminary result)   Collection Time: 12/25/21  8:40 AM   Specimen: Left Antecubital; Blood  Result Value Ref Range Status   Specimen Description LEFT ANTECUBITAL  Final   Special Requests   Final    BOTTLES DRAWN AEROBIC AND ANAEROBIC Blood Culture adequate volume   Culture   Final    NO GROWTH 4 DAYS Performed at [facility], [address] [place], [place] [address]    Report Status PENDING  Incomplete  SARS Coronavirus 2 by RT PCR (hospital order, performed in [place] hospital lab) *cepheid single result test* Anterior Nasal Swab     Status: None   Collection Time: 12/25/21  9:08 AM   Specimen: Anterior Nasal Swab  Result Value Ref Range Status   SARS Coronavirus 2 by RT PCR NEGATIVE NEGATIVE Final    Comment: (NOTE) SARS-CoV-2 target nucleic acids are NOT DETECTED.  The SARS-CoV-2 RNA is generally detectable in upper and lower respiratory specimens during the acute phase of infection. The lowest concentration of SARS-CoV-2 viral copies this assay can detect is 250 copies / mL. A negative result does not preclude SARS-CoV-2 infection and should not be used as the sole basis for treatment or other patient management decisions.  A negative result may occur with improper specimen collection / handling, submission of specimen other than nasopharyngeal swab, presence of viral mutation(s) within the areas targeted by this assay, and inadequate number of viral copies (<250 copies / mL). A negative result  must be combined with clinical observations, patient history, and epidemiological information.  Fact Sheet for Patients:  https://www.patel.info/  Fact Sheet for Healthcare Providers: https://hall.com/  This test is not yet approved or  cleared by the [place] FDA and has been authorized for detection and/or diagnosis of SARS-CoV-2 by FDA under an Emergency Use Authorization (EUA).  This EUA will remain in effect (meaning this test can be used) for the duration of the COVID-19 declaration under Section 564(b)(1) of the Act, 21 U.S.C. section 360bbb-3(b)(1), unless the authorization is terminated or revoked sooner.  Performed at [facility], [address], [place], [place] [address]   Culture, blood (Routine X 2) w Reflex to ID Panel     Status: None (Preliminary result)   Collection Time: 12/25/21  1:59 PM   Specimen: BLOOD LEFT HAND  Result Value Ref Range Status   Specimen Description BLOOD LEFT HAND  Final   Special Requests   Final    BOTTLES DRAWN AEROBIC ONLY Blood Culture adequate volume   Culture   Final    NO GROWTH 4 DAYS Performed at [facility], [address], [place], [place] [address]    Report Status PENDING  Incomplete  Culture, blood (Routine X 2) w Reflex to ID Panel     Status: None (Preliminary result)   Collection Time: 12/25/21  1:59 PM   Specimen: BLOOD RIGHT HAND  Result Value Ref Range Status   Specimen Description BLOOD RIGHT HAND  Final   Special Requests   Final    BOTTLES DRAWN AEROBIC ONLY Blood Culture results may not be optimal due to an inadequate volume of blood received in culture bottles   Culture   Final    NO GROWTH 4 DAYS Performed at [facility], [address], [place], [place] [address]    Report Status PENDING  Incomplete  Expectorated Sputum Assessment w Gram Stain, Rflx to Resp Cult     Status: None   Collection Time: 12/25/21  7:30 PM   Specimen: Expectorated Sputum  Result  Value Ref Range Status   Specimen Description EXPECTORATED SPUTUM  Final   Special Requests NONE  Final   Sputum evaluation   Final    THIS SPECIMEN IS ACCEPTABLE FOR SPUTUM CULTURE Performed at [facility], [address] [place], [place] [address]    Report Status 12/25/2021 FINAL  Final  Culture, Respiratory w Gram Stain     Status: None   Collection Time: 12/25/21  7:30 PM  Result Value Ref Range Status   Specimen Description   Final    EXPECTORATED SPUTUM Performed at [facility], [address], [place], [place] [address]    Special Requests   Final    NONE Reflexed from [id number] Performed at [facility], [address] [place], [place] [address]    Gram Stain   Final    MODERATE WBC PRESENT,BOTH PMN AND MONONUCLEAR RARE GRAM POSITIVE COCCI IN PAIRS IN CLUSTERS Performed at [facility], [place] [address], [place],  [address]    Culture FEW CANDIDA ALBICANS  Final   Report Status 12/28/2021 FINAL  Final    Radiology Studies:  Scheduled Meds:  apixaban  5 mg Oral BID   arformoterol  15 mcg Nebulization BID   aspirin  81 mg Oral Daily   atorvastatin  40 mg Oral Daily   budesonide (PULMICORT) nebulizer solution  0.25 mg Nebulization BID   Chlorhexidine Gluconate Cloth  6 each Topical Daily   feeding supplement (GLUCERNA SHAKE)  237 mL Oral TID BM   gabapentin  100 mg Oral  TID   insulin aspart  0-15 Units Subcutaneous TID WC   insulin aspart  0-5 Units Subcutaneous QHS   insulin glargine-yfgn  15 Units Subcutaneous Daily   ipratropium-albuterol  3 mL Nebulization Q6H   lactulose  30 g Oral Once   methylPREDNISolone (SOLU-MEDROL) injection  40 mg Intravenous Daily   metoprolol tartrate  75 mg Oral BID   montelukast  10 mg Oral QHS   pantoprazole  40 mg Oral Daily   polyethylene glycol  17 g Oral BID   Continuous Infusions:  azithromycin Stopped (12/28/21 1531)   ceFEPime (MAXIPIME) IV 2 g (12/29/21 0905)   vancomycin Stopped (12/29/21 0305)    LOS: 4  days   Roxan Hockey M.D on 12/29/2021 at 9:26 AM  Go to [email or web address] - for contact info  [facility] - Office  [phone]  If 7PM-7AM, please contact night-coverage [email or web address] 12/29/2021, 9:26 AM

## 2021-12-30 DIAGNOSIS — J189 Pneumonia, unspecified organism: Secondary | ICD-10-CM | POA: Diagnosis not present

## 2021-12-30 LAB — CULTURE, BLOOD (ROUTINE X 2)
Culture: NO GROWTH
Culture: NO GROWTH
Culture: NO GROWTH
Culture: NO GROWTH
Special Requests: ADEQUATE
Special Requests: ADEQUATE
Special Requests: ADEQUATE

## 2021-12-30 LAB — BASIC METABOLIC PANEL
Anion gap: 5 (ref 5–15)
BUN: 16 mg/dL (ref 8–23)
CO2: 26 mmol/L (ref 22–32)
Calcium: 10 mg/dL (ref 8.9–10.3)
Chloride: 105 mmol/L (ref 98–111)
Creatinine, Ser: 0.46 mg/dL (ref 0.44–1.00)
GFR, Estimated: >60 mL/min (ref >60)
Glucose, Bld: 153 mg/dL — ABNORMAL HIGH (ref 70–99)
Potassium: 4.1 mmol/L (ref 3.5–5.1)
Sodium: 136 mmol/L (ref 135–145)

## 2021-12-30 LAB — GLUCOSE, CAPILLARY
Glucose-Capillary: 142 mg/dL — ABNORMAL HIGH (ref 70–99)
Glucose-Capillary: 266 mg/dL — ABNORMAL HIGH (ref 70–99)
Glucose-Capillary: 326 mg/dL — ABNORMAL HIGH (ref 70–99)
Glucose-Capillary: 294 mg/dL — ABNORMAL HIGH (ref 70–99)

## 2021-12-30 LAB — CBC
HCT: 28 % — ABNORMAL LOW (ref 36.0–46.0)
Hemoglobin: 8.6 g/dL — ABNORMAL LOW (ref 12.0–15.0)
MCH: 27.2 pg (ref 26.0–34.0)
MCHC: 30.7 g/dL (ref 30.0–36.0)
MCV: 88.6 fL (ref 80.0–100.0)
Platelets: 101 10*3/uL — ABNORMAL LOW (ref 150–400)
RBC: 3.16 MIL/uL — ABNORMAL LOW (ref 3.87–5.11)
WBC: 9.1 10*3/uL (ref 4.0–10.5)
nRBC: 0 % (ref 0.0–0.2)

## 2021-12-30 NOTE — TOC Initial Note (Signed)
Transition of Care [facility]) - Initial/Assessment Note    Patient Details  Name: Michaela Moon MRN: [id number] Date of Birth: [birth year]  [facility] CM/SW Contact:    Iona Beard, [place] Phone Number: 12/30/2021, 2:20 PM  Clinical Narrative:                 CSW updated that PT eval has been ordered. CSW spoke with pt and son in room about possibility of SNF if recommended by PT. Pt states that if it is recommended she is agreeable to a SNF referral to the local [facility] facilities. TOC to follow for PT recommendations.   Expected Discharge Plan: Skilled Nursing Facility Barriers to Discharge: Continued Medical Work up   Patient Goals and CMS Choice Patient states their goals for this hospitalization and ongoing recovery are:: get better CMS Medicare.gov Compare Post Acute Care list provided to:: Patient Choice offered to / list presented to : Patient  Expected Discharge Plan and Services Expected Discharge Plan: [place] In-house Referral: Clinical Social Work Discharge Planning Services: CM Consult Post Acute Care Choice: [place] Living arrangements for the past 2 months: [place]                                      Prior Living Arrangements/Services Living arrangements for the past 2 months: Single Family Home Lives with:: Adult Children Patient language and need for interpreter reviewed:: Yes Do you feel safe going back to the place where you live?: Yes      Need for Family Participation in Patient Care: Yes (Comment) Care giver support system in place?: Yes (comment)   Criminal Activity/Legal Involvement Pertinent to Current Situation/Hospitalization: No - Comment as needed  Activities of Daily Living Home Assistive Devices/Equipment: Bedside commode/3-in-1, Oxygen ADL Screening (condition at time of admission) Patient's cognitive ability adequate to safely complete daily activities?:  Yes Is the patient deaf or have difficulty hearing?: No Does the patient have difficulty seeing, even when wearing glasses/contacts?: No Does the patient have difficulty concentrating, remembering, or making decisions?: Yes Patient able to express need for assistance with ADLs?: Yes Does the patient have difficulty dressing or bathing?: No Independently performs ADLs?: No Communication: Independent Dressing (OT): Needs assistance Is this a change from baseline?: Pre-admission baseline Grooming: Needs assistance Is this a change from baseline?: Pre-admission baseline Feeding: Needs assistance Is this a change from baseline?: Pre-admission baseline Bathing: Needs assistance Is this a change from baseline?: Pre-admission baseline Toileting: Needs assistance Is this a change from baseline?: Pre-admission baseline In/Out Bed: Independent with device (comment) Is this a change from baseline?: Pre-admission baseline Walks in Home: Dependent Is this a change from baseline?: Pre-admission baseline Does the patient have difficulty walking or climbing stairs?: Yes Weakness of Legs: Both Weakness of Arms/Hands: None  Permission Sought/Granted                  Emotional Assessment Appearance:: Appears stated age Attitude/Demeanor/Rapport: Engaged Affect (typically observed): Accepting Orientation: : Oriented to Self, Oriented to Place, Oriented to  Time, Oriented to Situation Alcohol / Substance Use: Not Applicable Psych Involvement: No (comment)  Admission diagnosis:  Hypoglycemia [E16.2] Acute respiratory failure with hypoxia (HCC) [J96.01] Multifocal pneumonia [J18.9] Patient Active Problem List   Diagnosis Date Noted   Acute on chronic respiratory failure with hypoxia ([place]) 12/25/2021   Hypoglycemia 12/25/2021  Insulin dependent type 2 diabetes mellitus ([place]) 12/25/2021   Hypothermia 33/29/5188   Acute diastolic CHF (congestive heart failure) ([place]) 12/16/2021   Lactic  acidosis 12/07/2021   Iron deficiency anemia 12/07/2021   Hypoalbuminemia due to protein-calorie malnutrition ([place]) 12/07/2021   Elevated brain natriuretic peptide (BNP) level 12/07/2021   Pressure injury of skin 11/29/2021   Acute respiratory failure with hypoxemia (HCC)    Recurrent pneumonia    Multifocal pneumonia 11/28/2021   Acute respiratory failure with hypoxia ([place]) 11/28/2021   Lung nodule 11/28/2021   AKI (acute kidney injury) ([place]) 11/28/2021   Carpal tunnel syndrome of right wrist    S/P carpal tunnel release left 10/23/17 right 11/20/17    Gastritis and gastroduodenitis 08/16/2016   Erosive gastritis    GERD (gastroesophageal reflux disease) 04/15/2016   Dysphagia 04/15/2016   Acute exacerbation of chronic obstructive pulmonary disease (COPD) ([place])    Pneumonia 01/29/2013   Hypercalcemia 12/24/2012   Pain in joint, ankle and foot 11/27/2012   Peripheral neuropathy 11/27/2012   Essential hypertension    Mixed hyperlipidemia    Diabetes mellitus without complication ([place])    Arthritis    Obesity    Osteopenia    PCP:  [name], MD Pharmacy:   [facility], [place] [address] Phone: [phone] Fax: [phone]     Social Determinants of Health (SDOH) Interventions    Readmission Risk Interventions     No data to display

## 2021-12-30 NOTE — Progress Notes (Signed)
RT was informed that son refused the patient use of the BIPAP stating " the device dried her out too much." RT removed device from room and left setup in case patient needed the device later.

## 2021-12-30 NOTE — Progress Notes (Signed)
PROGRESS NOTE   Michaela Moon, is a 86 y.o. female, DOB - 01-11-1936, ZDG:[id number]  Admit date - 12/25/2021   Admitting Physician Kathie Dike, MD  Outpatient Primary MD for the patient is [name], MD  LOS - 5  Chief Complaint  Patient presents with   Respiratory Distress       Brief Narrative:   86 y.o. female with medical history significant of COPD, diabetes, hypertension, recently diagnosed bilateral lower extremity DVT on anticoagulation admitted on 12/25/2021 with acute on chronic hypoxic respiratory failure in the setting of recurrent aspiration/multifocal pneumonia    -Assessment and Plan: 1)Recurrent Aspiration/Multifocal Pneumonia---pulmonology consult appreciated- -Respiratory sample Gram stain with showing GPC in pairs and clusters as well as Candida albicans -Continue cefepime, vancomycin and azithromycin,, bronchial dilators and mucolytics 12/30/21 -Respiratory status and oxygen requirement improving -Vancomycin trough is subtherapeutic pharmacy to adjust -Chest x-ray on 12/29/2021 consistent with aspiration pneumonia -Added Diflucan respiratory sample culture from 12/25/2021 reveals Candida albicans--- suspect oropharyngeal source  2)Acute on chronic hypoxic respiratory failure----secondary to #1 above -Weaned off continuous BiPAP 12/30/21 -Tolerated BiPAP well overnight--patient is encouraged to continue to use BiPAP nightly -Oxygen currently weaned down to 5 to 6 L via nasal cannula -PTA patient was on 4 L per nasal cannula -Management as above #1   3)Acute COPD exacerbation--- secondary to #1 above =-Antibiotics as above #1 -Respiratory Gram stain showing GPC in pairs and clusters  -Continue bronchodilators and mucolytic's -c/n steroids as ordered  4)HTN-c/n  metoprolol 75 mg twice daily to improve rate control and BP control   5)DM2- .  A1c 6.3 reflecting excellent diabetic control PTA - worsening hyperglycemia due to steroids Use  Novolog/Humalog Sliding scale insulin with Accu-Cheks/Fingersticks as ordered   6)GERD--stable, continue PPI especially while on steroids  7)Dysphagia--- recurrent aspiration concerns -Speech pathologist consults appreciated recommends dysphagia 2 (Mech soft); Nectar thickened liquids -Dr. Elsworth Soho discussed possible PEG tube placement with patient's son and patient -Respiratory status currently precludes PEG placement -Patient and son concerned that PEG placement will not improve her quality of life -She may try to be more compliant with dysphagia diet instead ???  If patient will benefit from nectar thickened liquids despite negative MBS study  8)Bil DVT-lower extremity venous Dopplers from 12/08/2021 noted -CTA angio chest from 12/08/2021 and repeat CTA chest 12/25/2021 without acute PE -Continue Eliquis twice daily  Disposition/Need for in-Hospital Stay- patient unable to be discharged at this time due to --acute on chronic hypoxic respiratory failure secondary to recurrent aspiration pneumonia requiring IV antibiotics and high flow oxygen  Status is: Inpatient   Disposition: The patient is from: Home              Anticipated d/c is to: Home with [facility]              Anticipated d/c date is: > 3 days              Patient currently is not medically stable to d/c. Barriers: Not Clinically Stable-   Code Status :  -  Code Status: DNR   Family Communication:   Discussed with son at bedside   DVT Prophylaxis  :   - SCDs   apixaban (ELIQUIS) tablet 5 mg   Lab Results  Component Value Date   PLT 101 (L) 12/30/2021   Inpatient Medications  Scheduled Meds:  apixaban  5 mg Oral BID   arformoterol  15 mcg Nebulization BID   aspirin  81 mg Oral Daily  atorvastatin  40 mg Oral Daily   budesonide (PULMICORT) nebulizer solution  0.25 mg Nebulization BID   Chlorhexidine Gluconate Cloth  6 each Topical Daily   feeding supplement (GLUCERNA SHAKE)  237 mL Oral TID BM   fluconazole  200 mg  Oral Daily   gabapentin  100 mg Oral TID   insulin aspart  0-15 Units Subcutaneous TID WC   insulin aspart  0-5 Units Subcutaneous QHS   insulin glargine-yfgn  15 Units Subcutaneous Daily   ipratropium-albuterol  3 mL Nebulization Q6H   methylPREDNISolone (SOLU-MEDROL) injection  40 mg Intravenous Daily   metoprolol tartrate  75 mg Oral BID   montelukast  10 mg Oral QHS   pantoprazole  40 mg Oral Daily   polyethylene glycol  17 g Oral BID   Continuous Infusions:   PRN Meds:.acetaminophen **OR** acetaminophen, albuterol, guaiFENesin, ondansetron **OR** ondansetron (ZOFRAN) IV, oxybutynin   Anti-infectives (From admission, onward)    Start     Dose/Rate Route Frequency Ordered Stop   12/29/21 1015  fluconazole (DIFLUCAN) tablet 200 mg        200 mg Oral Daily 12/29/21 0929 01/05/22 0959   12/29/21 0000  vancomycin (VANCOREADY) IVPB 750 mg/150 mL        750 mg 150 mL/hr over 60 Minutes Intravenous Every 12 hours 12/28/21 1439 12/30/21 1307   12/26/21 1200  vancomycin (VANCOREADY) IVPB 1250 mg/250 mL  Status:  Discontinued        1,250 mg 166.7 mL/hr over 90 Minutes Intravenous Every 24 hours 12/25/21 1134 12/28/21 1439   12/25/21 2200  ceFEPIme (MAXIPIME) 2 g in sodium chloride 0.9 % 100 mL IVPB        2 g 200 mL/hr over 30 Minutes Intravenous Every 12 hours 12/25/21 1250 12/30/21 0852   12/25/21 1430  azithromycin (ZITHROMAX) 500 mg in sodium chloride 0.9 % 250 mL IVPB        500 mg 250 mL/hr over 60 Minutes Intravenous Every 24 hours 12/25/21 1250 12/29/21 1748   12/25/21 1200  vancomycin (VANCOREADY) IVPB 1500 mg/300 mL        1,500 mg 150 mL/hr over 120 Minutes Intravenous  Once 12/25/21 1129 12/25/21 1352   12/25/21 1115  ceFEPIme (MAXIPIME) 2 g in sodium chloride 0.9 % 100 mL IVPB        2 g 200 mL/hr over 30 Minutes Intravenous  Once 12/25/21 1105 12/25/21 1241        Subjective: Michaela Moon today has no fevers, no emesis,  No chest pain,    12/30/21 -had BM with  laxatives -Cough and shortness of breath improving  -tolerating BiPAP qhs -Oxygen currently weaned down to 5 to 6 L via nasal cannula  Objective: Vitals:   12/30/21 0829 12/30/21 0832 12/30/21 1320 12/30/21 1410  BP:   129/64   Pulse:   73   Resp:   17   Temp:   98.7 F (37.1 C)   TempSrc:   Oral   SpO2: 100% 100% 94% 94%  Weight:      Height:        Intake/Output Summary (Last 24 hours) at 12/30/2021 1629 Last data filed at 12/30/2021 1100 Gross per 24 hour  Intake 720 ml  Output 500 ml  Net 220 ml   Filed Weights   12/25/21 1300 12/26/21 0600 12/29/21 0433  Weight: 79 kg 79.2 kg 78.3 kg    Physical Exam  Gen:- Awake Alert, able to speak in short sentences  HEENT:- [place].AT, No sclera icterus, Nose-- [place] 6L/min Neck-Supple Neck,No JVD,.  Lungs-improving air movement, scattered rhonchi and wheezes bilaterally  CV- S1, S2 normal, regular , tachycardic Abd-  +ve B.Sounds, Abd Soft, No tenderness,    Extremity/Skin:- No  edema, pedal pulses present  Psych-affect is appropriate, oriented x3 Neuro-generalized weakness, no new focal deficits, no tremors  Data Reviewed: I have personally reviewed following labs and imaging studies  CBC: Recent Labs  Lab 12/25/21 0823 12/26/21 0302 12/29/21 0334 12/30/21 0325  WBC 25.4* 13.3* 10.2 9.1  NEUTROABS 22.8*  --   --   --   HGB 10.5* 9.1* 8.9* 8.6*  HCT 34.0* 29.8* 29.0* 28.0*  MCV 86.7 87.9 87.3 88.6  PLT 155 124* 112* 923*   Basic Metabolic Panel: Recent Labs  Lab 12/25/21 0823 12/26/21 0302 12/28/21 0258 12/30/21 0325  NA 138 137 138 136  K 3.4* 4.6 3.8 4.1  CL 106 106 107 105  CO2 '27 26 24 26  '$ GLUCOSE 30* 263* 186* 153*  BUN 33* 30* 26* 16  CREATININE 0.54 0.86 0.54 0.46  CALCIUM 10.5* 10.1 10.0 10.0   GFR: Estimated Creatinine Clearance: 54.1 mL/min (by C-G formula based on SCr of 0.46 mg/dL). Liver Function Tests: Recent Labs  Lab 12/26/21 0302  AST 19  ALT 69*  ALKPHOS 38  BILITOT 0.6  PROT 5.8*   ALBUMIN 2.9*   Recent Results (from the past 240 hour(s))  Culture, blood (routine x 2)     Status: None   Collection Time: 12/25/21  8:25 AM   Specimen: Right Antecubital; Blood  Result Value Ref Range Status   Specimen Description RIGHT ANTECUBITAL  Final   Special Requests   Final    BOTTLES DRAWN AEROBIC AND ANAEROBIC Blood Culture adequate volume   Culture   Final    NO GROWTH 5 DAYS Performed at [facility], [address], [place], [place] [address]    Report Status 12/30/2021 FINAL  Final  Culture, blood (routine x 2)     Status: None   Collection Time: 12/25/21  8:40 AM   Specimen: Left Antecubital; Blood  Result Value Ref Range Status   Specimen Description LEFT ANTECUBITAL  Final   Special Requests   Final    BOTTLES DRAWN AEROBIC AND ANAEROBIC Blood Culture adequate volume   Culture   Final    NO GROWTH 5 DAYS Performed at [facility], [address] [place], [place] [address]    Report Status 12/30/2021 FINAL  Final  SARS Coronavirus 2 by RT PCR (hospital order, performed in [facility] hospital lab) *cepheid single result test* Anterior Nasal Swab     Status: None   Collection Time: 12/25/21  9:08 AM   Specimen: Anterior Nasal Swab  Result Value Ref Range Status   SARS Coronavirus 2 by RT PCR NEGATIVE NEGATIVE Final    Comment: (NOTE) SARS-CoV-2 target nucleic acids are NOT DETECTED.  The SARS-CoV-2 RNA is generally detectable in upper and lower respiratory specimens during the acute phase of infection. The lowest concentration of SARS-CoV-2 viral copies this assay can detect is 250 copies / mL. A negative result does not preclude SARS-CoV-2 infection and should not be used as the sole basis for treatment or other patient management decisions.  A negative result may occur with improper specimen collection / handling, submission of specimen other than nasopharyngeal swab, presence of viral mutation(s) within the areas targeted by this assay, and inadequate  number of viral copies (<250 copies /  mL). A negative result must be combined with clinical observations, patient history, and epidemiological information.  Fact Sheet for Patients:   https://www.patel.info/  Fact Sheet for Healthcare Providers: https://hall.com/  This test is not yet approved or  cleared by the [place] FDA and has been authorized for detection and/or diagnosis of SARS-CoV-2 by FDA under an Emergency Use Authorization (EUA).  This EUA will remain in effect (meaning this test can be used) for the duration of the COVID-19 declaration under Section 564(b)(1) of the Act, 21 U.S.C. section 360bbb-3(b)(1), unless the authorization is terminated or revoked sooner.  Performed at [facility], [address], [place], [place] [address]   Culture, blood (Routine X 2) w Reflex to ID Panel     Status: None   Collection Time: 12/25/21  1:59 PM   Specimen: BLOOD LEFT HAND  Result Value Ref Range Status   Specimen Description BLOOD LEFT HAND  Final   Special Requests   Final    BOTTLES DRAWN AEROBIC ONLY Blood Culture adequate volume   Culture   Final    NO GROWTH 5 DAYS Performed at [facility], [address], [place], [place] [address]    Report Status 12/30/2021 FINAL  Final  Culture, blood (Routine X 2) w Reflex to ID Panel     Status: None   Collection Time: 12/25/21  1:59 PM   Specimen: BLOOD RIGHT HAND  Result Value Ref Range Status   Specimen Description BLOOD RIGHT HAND  Final   Special Requests   Final    BOTTLES DRAWN AEROBIC ONLY Blood Culture results may not be optimal due to an inadequate volume of blood received in culture bottles   Culture   Final    NO GROWTH 5 DAYS Performed at [facility], [address] [place], [place] [address]    Report Status 12/30/2021 FINAL  Final  Expectorated Sputum Assessment w Gram Stain, Rflx to Resp Cult     Status: None   Collection Time: 12/25/21  7:30 PM   Specimen:  Expectorated Sputum  Result Value Ref Range Status   Specimen Description EXPECTORATED SPUTUM  Final   Special Requests NONE  Final   Sputum evaluation   Final    THIS SPECIMEN IS ACCEPTABLE FOR SPUTUM CULTURE Performed at [facility], [address], [place], [place] [address]    Report Status 12/25/2021 FINAL  Final  Culture, Respiratory w Gram Stain     Status: None   Collection Time: 12/25/21  7:30 PM  Result Value Ref Range Status   Specimen Description   Final    EXPECTORATED SPUTUM Performed at [facility], [address], [place], [place] [address]    Special Requests   Final    NONE Reflexed from [id number] Performed at [facility], [address] [place], [place] [address]    Gram Stain   Final    MODERATE WBC PRESENT,BOTH PMN AND MONONUCLEAR RARE GRAM POSITIVE COCCI IN PAIRS IN CLUSTERS Performed at [facility], [place] [address] [place], [place] [address]    Culture FEW CANDIDA ALBICANS  Final   Report Status 12/28/2021 FINAL  Final    Radiology Studies:  Scheduled Meds:  apixaban  5 mg Oral BID   arformoterol  15 mcg Nebulization BID   aspirin  81 mg Oral Daily   atorvastatin  40 mg Oral Daily   budesonide (PULMICORT) nebulizer solution  0.25 mg Nebulization BID   Chlorhexidine Gluconate Cloth  6 each Topical  Daily   feeding supplement (GLUCERNA SHAKE)  237 mL Oral TID BM   fluconazole  200 mg Oral Daily   gabapentin  100 mg Oral TID   insulin aspart  0-15 Units Subcutaneous TID WC   insulin aspart  0-5 Units Subcutaneous QHS   insulin glargine-yfgn  15 Units Subcutaneous Daily   ipratropium-albuterol  3 mL Nebulization Q6H   methylPREDNISolone (SOLU-MEDROL) injection  40 mg Intravenous Daily   metoprolol tartrate  75 mg Oral BID   montelukast  10 mg Oral QHS   pantoprazole  40 mg Oral Daily   polyethylene glycol  17 g Oral BID   Continuous Infusions:   LOS: 5 days   Roxan Hockey M.D on 12/30/2021 at 4:29 PM  Go to [email or web address] - for contact  info  [facility] - Office  [phone]  If 7PM-7AM, please contact night-coverage [email or web address] 12/30/2021, 4:29 PM

## 2021-12-31 ENCOUNTER — Inpatient Hospital Stay (HOSPITAL_COMMUNITY): Payer: Medicare Other

## 2021-12-31 LAB — GLUCOSE, CAPILLARY
Glucose-Capillary: 128 mg/dL — ABNORMAL HIGH (ref 70–99)
Glucose-Capillary: 209 mg/dL — ABNORMAL HIGH (ref 70–99)
Glucose-Capillary: 333 mg/dL — ABNORMAL HIGH (ref 70–99)
Glucose-Capillary: 259 mg/dL — ABNORMAL HIGH (ref 70–99)

## 2021-12-31 LAB — CBC
HCT: 28.2 % — ABNORMAL LOW (ref 36.0–46.0)
Hemoglobin: 9.1 g/dL — ABNORMAL LOW (ref 12.0–15.0)
MCH: 27.9 pg (ref 26.0–34.0)
MCHC: 32.3 g/dL (ref 30.0–36.0)
MCV: 86.5 fL (ref 80.0–100.0)
Platelets: 109 10*3/uL — ABNORMAL LOW (ref 150–400)
RBC: 3.26 MIL/uL — ABNORMAL LOW (ref 3.87–5.11)
RDW: 26.1 % — ABNORMAL HIGH (ref 11.5–15.5)
WBC: 9.9 10*3/uL (ref 4.0–10.5)
nRBC: 0 % (ref 0.0–0.2)

## 2021-12-31 MED ORDER — SODIUM CHLORIDE 0.9 % IV SOLN
2.0000 g | INTRAVENOUS | Status: DC
  Administered 2021-12-31: 2 g via INTRAVENOUS
  Filled 2021-12-31: qty 20

## 2021-12-31 NOTE — TOC Progression Note (Addendum)
Transition of Care [facility]) - Progression Note    Patient Details  Name: Michaela Moon MRN: [id number] Date of Birth: 1936-01-11  Transition of [facility] CM/SW Contact  Ihor Gully, LCSW Phone Number: 12/31/2021, 3:19 PM  Clinical Narrative:    PT recommends HH. Discussed with son, who was at patient's bedside. Patient reports that she is active with [facility]. [facility] confirms patient is active with PT and RN.   Expected Discharge Plan: [place] Barriers to Discharge: Continued Medical Work up  Expected Discharge Plan and Services Expected Discharge Plan: [place] In-house Referral: Clinical Social Work Discharge Planning Services: CM Consult Post Acute Care Choice: [place] arrangements for the past 2 months: Single Family Home                                       Social Determinants of Health (SDOH) Interventions    Readmission Risk Interventions     No data to display

## 2021-12-31 NOTE — Progress Notes (Signed)
PROGRESS NOTE   Michaela Moon, is a 86 y.o. female, DOB - [birth year], GDJ:[id number]  Admit date - 12/25/2021   Admitting Physician Kathie Dike, MD  Outpatient Primary MD for the patient is [name], MD  LOS - 6  Chief Complaint  Patient presents with   Respiratory Distress       Brief Narrative:   86 y.o. female with medical history significant of COPD, diabetes, hypertension, recently diagnosed bilateral lower extremity DVT on anticoagulation admitted on 12/25/2021 with acute on chronic hypoxic respiratory failure in the setting of recurrent aspiration/multifocal pneumonia    -Assessment and Plan: 1)Recurrent Aspiration/Multifocal Pneumonia---pulmonology consult appreciated- -Respiratory sample Gram stain with showing GPC in pairs and clusters as well as Candida albicans -Continue bronchial dilators and mucolytics 12/31/21 -Respiratory status and oxygen requirement improving -Chest x-ray on 12/31/2021 consistent with bilateral pneumonia worse on the right suspect aspiration related -Added Diflucan respiratory sample culture from 12/25/2021 reveals Candida albicans--- suspect oropharyngeal source -Completed azithromycin and vancomycin -De-escalate from cefepime to Rocephin for another 2 days  2)Acute on chronic hypoxic respiratory failure----secondary to #1 above -Weaned off continuous BiPAP 12/31/21 -Tolerated BiPAP well overnight--patient is encouraged to continue to use BiPAP nightly -Oxygen currently weaned down to  6 L via nasal cannula -PTA patient was on 4 L per nasal cannula -Management as above #1   3)Acute COPD exacerbation--- secondary to #1 above =-Antibiotics as above #1 -Respiratory Gram stain showing GPC in pairs and clusters  -Continue bronchodilators and mucolytic's -c/n steroids as ordered  4)HTN-c/n  metoprolol 75 mg twice daily to improve rate control and BP control   5)DM2- .  A1c 6.3 reflecting excellent diabetic control PTA - worsening  hyperglycemia due to steroids Use Novolog/Humalog Sliding scale insulin with Accu-Cheks/Fingersticks as ordered   6)GERD--stable, continue PPI especially while on steroids  7)Dysphagia--- recurrent aspiration concerns -Speech pathologist consults appreciated recommends dysphagia 2 (Mech soft); Nectar thickened liquids -Dr. Elsworth Soho discussed possible PEG tube placement with patient's son and patient -Respiratory status currently precludes PEG placement -Patient and son concerned that PEG placement will not improve her quality of life -She may try to be more compliant with dysphagia diet instead  8)Bil DVT-lower extremity venous Dopplers from 12/08/2021 noted -CTA angio chest from 12/08/2021 and repeat CTA chest 12/25/2021 without acute PE -Continue Eliquis twice daily  9) generalized weakness and deconditioning--physical therapy evaluation appreciated possible discharge home with home health  Disposition/Need for in-Hospital Stay- patient unable to be discharged at this time due to --acute on chronic hypoxic respiratory failure secondary to recurrent aspiration pneumonia requiring IV antibiotics and high flow oxygen -Anticipate discharge home with home health services on 01/01/2022 if continues to improve--- currently requiring up to 6 L of oxygen via nasal cannula, PTA was on 4 L, repeat chest x-ray on 12/31/2021 continues to show significant bilateral pneumonia right more than left  Status is: Inpatient   Disposition: The patient is from: Home              Anticipated d/c is to: Home with [facility]              Anticipated d/c date is: > 3 days              Patient currently is not medically stable to d/c. Barriers: Not Clinically Stable-   Code Status :  -  Code Status: DNR   Family Communication:   Discussed with son at bedside   DVT Prophylaxis  :   -  SCDs   apixaban (ELIQUIS) tablet 5 mg   Lab Results  Component Value Date   PLT 109 (L) 12/31/2021   Inpatient Medications  Scheduled  Meds:  apixaban  5 mg Oral BID   arformoterol  15 mcg Nebulization BID   aspirin  81 mg Oral Daily   atorvastatin  40 mg Oral Daily   budesonide (PULMICORT) nebulizer solution  0.25 mg Nebulization BID   Chlorhexidine Gluconate Cloth  6 each Topical Daily   feeding supplement (GLUCERNA SHAKE)  237 mL Oral TID BM   fluconazole  200 mg Oral Daily   gabapentin  100 mg Oral TID   insulin aspart  0-15 Units Subcutaneous TID WC   insulin aspart  0-5 Units Subcutaneous QHS   insulin glargine-yfgn  15 Units Subcutaneous Daily   ipratropium-albuterol  3 mL Nebulization Q6H   methylPREDNISolone (SOLU-MEDROL) injection  40 mg Intravenous Daily   metoprolol tartrate  75 mg Oral BID   montelukast  10 mg Oral QHS   pantoprazole  40 mg Oral Daily   polyethylene glycol  17 g Oral BID   Continuous Infusions:   PRN Meds:.acetaminophen **OR** acetaminophen, albuterol, guaiFENesin, ondansetron **OR** ondansetron (ZOFRAN) IV, oxybutynin   Anti-infectives (From admission, onward)    Start     Dose/Rate Route Frequency Ordered Stop   12/29/21 1015  fluconazole (DIFLUCAN) tablet 200 mg        200 mg Oral Daily 12/29/21 0929 01/05/22 0959   12/29/21 0000  vancomycin (VANCOREADY) IVPB 750 mg/150 mL        750 mg 150 mL/hr over 60 Minutes Intravenous Every 12 hours 12/28/21 1439 12/30/21 1307   12/26/21 1200  vancomycin (VANCOREADY) IVPB 1250 mg/250 mL  Status:  Discontinued        1,250 mg 166.7 mL/hr over 90 Minutes Intravenous Every 24 hours 12/25/21 1134 12/28/21 1439   12/25/21 2200  ceFEPIme (MAXIPIME) 2 g in sodium chloride 0.9 % 100 mL IVPB        2 g 200 mL/hr over 30 Minutes Intravenous Every 12 hours 12/25/21 1250 12/30/21 0852   12/25/21 1430  azithromycin (ZITHROMAX) 500 mg in sodium chloride 0.9 % 250 mL IVPB        500 mg 250 mL/hr over 60 Minutes Intravenous Every 24 hours 12/25/21 1250 12/29/21 1748   12/25/21 1200  vancomycin (VANCOREADY) IVPB 1500 mg/300 mL        1,500 mg 150  mL/hr over 120 Minutes Intravenous  Once 12/25/21 1129 12/25/21 1352   12/25/21 1115  ceFEPIme (MAXIPIME) 2 g in sodium chloride 0.9 % 100 mL IVPB        2 g 200 mL/hr over 30 Minutes Intravenous  Once 12/25/21 1105 12/25/21 1241        Subjective: [name] today has no fevers, no emesis,  No chest pain,    12/31/21 -Cough and dyspnea persist -Son at bedside questions answered -Anticipate discharge home with home health services on 01/01/2022 if continues to improve--- currently requiring up to 6 L of oxygen via nasal cannula, PTA was on 4 L, repeat chest x-ray on 12/31/2021 continues to show significant bilateral pneumonia right more than left  Objective: Vitals:   12/31/21 0729 12/31/21 0904 12/31/21 1400 12/31/21 1407  BP:  (!) 144/53 (!) 151/46   Pulse:  74 71   Resp:   20   Temp:   98.1 F (36.7 C)   TempSrc:   Oral   SpO2: 92%  96% 95%  Weight:      Height:        Intake/Output Summary (Last 24 hours) at 12/31/2021 1639 Last data filed at 12/31/2021 1622 Gross per 24 hour  Intake 840 ml  Output 1300 ml  Net -460 ml   Filed Weights   12/25/21 1300 12/26/21 0600 12/29/21 0433  Weight: 79 kg 79.2 kg 78.3 kg    Physical Exam  Gen:- Awake Alert, able to speak in short sentences  HEENT:- [place].AT, No sclera icterus, Nose-- [place] 6L/min Neck-Supple Neck,No JVD,.  Lungs-improving air movement, scattered rhonchi and wheezes bilaterally  CV- S1, S2 normal, regular , tachycardic Abd-  +ve B.Sounds, Abd Soft, No tenderness,    Extremity/Skin:- No  edema, pedal pulses present  Psych-affect is appropriate, oriented x3 Neuro-generalized weakness, no new focal deficits, no tremors  Data Reviewed: I have personally reviewed following labs and imaging studies  CBC: Recent Labs  Lab 12/25/21 0823 12/26/21 0302 12/29/21 0334 12/30/21 0325 12/31/21 0353  WBC 25.4* 13.3* 10.2 9.1 9.9  NEUTROABS 22.8*  --   --   --   --   HGB 10.5* 9.1* 8.9* 8.6* 9.1*  HCT 34.0* 29.8*  29.0* 28.0* 28.2*  MCV 86.7 87.9 87.3 88.6 86.5  PLT 155 124* 112* 101* 751*   Basic Metabolic Panel: Recent Labs  Lab 12/25/21 0823 12/26/21 0302 12/28/21 0258 12/30/21 0325  NA 138 137 138 136  K 3.4* 4.6 3.8 4.1  CL 106 106 107 105  CO2 '27 26 24 26  '$ GLUCOSE 30* 263* 186* 153*  BUN 33* 30* 26* 16  CREATININE 0.54 0.86 0.54 0.46  CALCIUM 10.5* 10.1 10.0 10.0   GFR: Estimated Creatinine Clearance: 54.1 mL/min (by C-G formula based on SCr of 0.46 mg/dL). Liver Function Tests: Recent Labs  Lab 12/26/21 0302  AST 19  ALT 69*  ALKPHOS 38  BILITOT 0.6  PROT 5.8*  ALBUMIN 2.9*   Recent Results (from the past 240 hour(s))  Culture, blood (routine x 2)     Status: None   Collection Time: 12/25/21  8:25 AM   Specimen: Right Antecubital; Blood  Result Value Ref Range Status   Specimen Description RIGHT ANTECUBITAL  Final   Special Requests   Final    BOTTLES DRAWN AEROBIC AND ANAEROBIC Blood Culture adequate volume   Culture   Final    NO GROWTH 5 DAYS Performed at [facility], [address] [place], [place] [address]    Report Status 12/30/2021 FINAL  Final  Culture, blood (routine x 2)     Status: None   Collection Time: 12/25/21  8:40 AM   Specimen: Left Antecubital; Blood  Result Value Ref Range Status   Specimen Description LEFT ANTECUBITAL  Final   Special Requests   Final    BOTTLES DRAWN AEROBIC AND ANAEROBIC Blood Culture adequate volume   Culture   Final    NO GROWTH 5 DAYS Performed at [facility], [address] [place], [place] [address]    Report Status 12/30/2021 FINAL  Final  SARS Coronavirus 2 by RT PCR (hospital order, performed in [facility] hospital lab) *cepheid single result test* Anterior Nasal Swab     Status: None   Collection Time: 12/25/21  9:08 AM   Specimen: Anterior Nasal Swab  Result Value Ref Range Status   SARS Coronavirus 2 by RT PCR NEGATIVE NEGATIVE Final    Comment: (NOTE) SARS-CoV-2 target nucleic acids are NOT  DETECTED.  The SARS-CoV-2 RNA  is generally detectable in upper and lower respiratory specimens during the acute phase of infection. The lowest concentration of SARS-CoV-2 viral copies this assay can detect is 250 copies / mL. A negative result does not preclude SARS-CoV-2 infection and should not be used as the sole basis for treatment or other patient management decisions.  A negative result may occur with improper specimen collection / handling, submission of specimen other than nasopharyngeal swab, presence of viral mutation(s) within the areas targeted by this assay, and inadequate number of viral copies (<250 copies / mL). A negative result must be combined with clinical observations, patient history, and epidemiological information.  Fact Sheet for Patients:   https://www.patel.info/  Fact Sheet for Healthcare Providers: https://hall.com/  This test is not yet approved or  cleared by the [place] FDA and has been authorized for detection and/or diagnosis of SARS-CoV-2 by FDA under an Emergency Use Authorization (EUA).  This EUA will remain in effect (meaning this test can be used) for the duration of the COVID-19 declaration under Section 564(b)(1) of the Act, 21 U.S.C. section 360bbb-3(b)(1), unless the authorization is terminated or revoked sooner.  Performed at [facility], [address], [place], [place] [address]   Culture, blood (Routine X 2) w Reflex to ID Panel     Status: None   Collection Time: 12/25/21  1:59 PM   Specimen: BLOOD LEFT HAND  Result Value Ref Range Status   Specimen Description BLOOD LEFT HAND  Final   Special Requests   Final    BOTTLES DRAWN AEROBIC ONLY Blood Culture adequate volume   Culture   Final    NO GROWTH 5 DAYS Performed at [facility], [address], [place], [place] [address]    Report Status 12/30/2021 FINAL  Final  Culture, blood (Routine X 2) w Reflex to ID Panel     Status: None    Collection Time: 12/25/21  1:59 PM   Specimen: BLOOD RIGHT HAND  Result Value Ref Range Status   Specimen Description BLOOD RIGHT HAND  Final   Special Requests   Final    BOTTLES DRAWN AEROBIC ONLY Blood Culture results may not be optimal due to an inadequate volume of blood received in culture bottles   Culture   Final    NO GROWTH 5 DAYS Performed at [facility], [address], [place], [place] [address]    Report Status 12/30/2021 FINAL  Final  Expectorated Sputum Assessment w Gram Stain, Rflx to Resp Cult     Status: None   Collection Time: 12/25/21  7:30 PM   Specimen: Expectorated Sputum  Result Value Ref Range Status   Specimen Description EXPECTORATED SPUTUM  Final   Special Requests NONE  Final   Sputum evaluation   Final    THIS SPECIMEN IS ACCEPTABLE FOR SPUTUM CULTURE Performed at [facility], [address], [place], [place] [address]    Report Status 12/25/2021 FINAL  Final  Culture, Respiratory w Gram Stain     Status: None   Collection Time: 12/25/21  7:30 PM  Result Value Ref Range Status   Specimen Description   Final    EXPECTORATED SPUTUM Performed at [facility], [address] [place], [place] [address]    Special Requests   Final    NONE Reflexed from [id number] Performed at [facility], [address], [place],  [address]    Gram Stain   Final    MODERATE WBC PRESENT,BOTH PMN AND MONONUCLEAR RARE GRAM POSITIVE  COCCI IN PAIRS IN CLUSTERS Performed at [facility], [place] [address], [place], [place] [address]    Culture FEW CANDIDA ALBICANS  Final   Report Status 12/28/2021 FINAL  Final    Radiology Studies:  Scheduled Meds:  apixaban  5 mg Oral BID   arformoterol  15 mcg Nebulization BID   aspirin  81 mg Oral Daily   atorvastatin  40 mg Oral Daily   budesonide (PULMICORT) nebulizer solution  0.25 mg Nebulization BID   Chlorhexidine Gluconate Cloth  6 each Topical Daily   feeding supplement (GLUCERNA SHAKE)  237 mL Oral TID BM    fluconazole  200 mg Oral Daily   gabapentin  100 mg Oral TID   insulin aspart  0-15 Units Subcutaneous TID WC   insulin aspart  0-5 Units Subcutaneous QHS   insulin glargine-yfgn  15 Units Subcutaneous Daily   ipratropium-albuterol  3 mL Nebulization Q6H   methylPREDNISolone (SOLU-MEDROL) injection  40 mg Intravenous Daily   metoprolol tartrate  75 mg Oral BID   montelukast  10 mg Oral QHS   pantoprazole  40 mg Oral Daily   polyethylene glycol  17 g Oral BID   Continuous Infusions:   LOS: 6 days   Roxan Hockey M.D on 12/31/2021 at 4:39 PM  Go to [email or web address] - for contact info  [facility] - Office  [phone]  If 7PM-7AM, please contact night-coverage [email or web address] 12/31/2021, 4:39 PM

## 2021-12-31 NOTE — Plan of Care (Signed)
  Problem: Acute Rehab PT Goals(only PT should resolve) Goal: Pt Will Go Supine/Side To Sit Outcome: Progressing Flowsheets (Taken 12/31/2021 1403) Pt will go Supine/Side to Sit: Independently Goal: Patient Will Transfer Sit To/From Stand Outcome: Progressing Flowsheets (Taken 12/31/2021 1403) Patient will transfer sit to/from stand: Independently Goal: Pt Will Transfer Bed To Chair/Chair To Bed Outcome: Progressing Flowsheets (Taken 12/31/2021 1403) Pt will Transfer Bed to Chair/Chair to Bed: with supervision Goal: Pt Will Ambulate Outcome: Progressing Flowsheets (Taken 12/31/2021 1403) Pt will Ambulate:  50 feet  with supervision  with rolling walker

## 2021-12-31 NOTE — Evaluation (Signed)
Physical Therapy Evaluation Patient Details Name: Michaela Moon MRN: [id number] DOB: May 02, 1935 Today's Date: 12/31/2021  History of Present Illness  Michaela Moon is a 86 y.o. female with medical history significant of COPD, diabetes, hypertension, recently diagnosed bilateral lower extremity DVT on anticoagulation, who was unfortunately who has had multiple hospitalizations since September of this year when she was diagnosed with COVID.  She was treated with antivirals and steroids at the time of her diagnosis in September at [facility] and was discharged home with oxygen on 2 L.  Unfortunately, since that time she has had 2 admissions to [facility] with multifocal pneumonia.  She was treated with ceftriaxone and azithromycin on both occasions.  She was also treated with steroids and bronchodilators.  She was discharged home on 4 L of oxygen at the time of discharge.  After her last discharge, she reports that she was initially feeling well and did not have any shortness of breath.  She does have a chronic cough which she feels has not really changed.  Her son noted that she felt unwell last night, but her respiratory status appears to be near baseline.  This morning, he had noted her in bed and checked a pulse ox on her and it was noted to be in the 80s despite wearing 4 L of oxygen.  He proceeded to give her a breathing treatment which transiently improved her oxygen saturations into the 90s, but then subsequently began to drop back down to the 80s.  EMS was called and placed her on nonrebreather mask on their arrival.  Despite this, her oxygen saturations remained in the 80s.  On arrival to the emergency room, she was initially placed on BiPAP but did not tolerate this very well.  She was subsequently transitioned to high flow nasal cannula which appears to have had a better effect within oxygen saturations in the 90s.  She was profoundly hypoglycemic with blood sugar in the 20s to 30s and  received dextrose.  She was also hypothermic.  Hemodynamics are otherwise stable.  CT scan of the chest was repeated and did not show any evidence of pulmonary embolism, but did show persistent multifocal pneumonia.  BNP was checked and noted to be normal.  Currently on 15 L of oxygen via nasal cannula.  She has been referred for admission.    Clinical Impression  Patient lying in bed on therapist arrival on 5.5 Liters of O2.  Son at bedside.  Patient pleasant and agreeable to physical therapy assessment.  Patient performed supine to sit with modified independence and extra time with [facility] elevated as she has a hospital bed at home . Patient able to sit on the edge of bed without any balance issue with feet on the floor.   Sit to stand to RW with SBA/CGA from therapist; able to take several steps and return to bed with therapist CGA/SBA and cueing for technique.  O2 sat at 95% at the end of assessment today.  Patient will benefit from continued skilled therapy services during the remainder of her hospital stay and at the next recommended venue of care to address deficits and promote return to optimal function.         Recommendations for follow up therapy are one component of a multi-disciplinary discharge planning process, led by the attending physician.  Recommendations may be updated based on patient status, additional functional criteria and insurance authorization.  Follow Up Recommendations Home health PT  Assistance Recommended at Discharge Intermittent Supervision/Assistance  Patient can return home with the following  A little help with walking and/or transfers;A little help with bathing/dressing/bathroom;Help with stairs or ramp for entrance;Assistance with cooking/housework    Equipment Recommendations None recommended by PT  Recommendations for Other Services       Functional Status Assessment Patient has had a recent decline in their functional status and demonstrates the  ability to make significant improvements in function in a reasonable and predictable amount of time.     Precautions / Restrictions Precautions Precautions: Fall Precaution Comments: O2 Restrictions Weight Bearing Restrictions: No      Mobility  Bed Mobility Overal bed mobility: Modified Independent Bed Mobility: Supine to Sit, Sit to Supine     Supine to sit: Supervision Sit to supine: Supervision   General bed mobility comments: increased time; has hospital bed at home Patient Response: Cooperative  Transfers Overall transfer level: Modified independent Equipment used: Rolling walker (2 wheels) Transfers: Sit to/from Stand Sit to Stand: Supervision           General transfer comment: patient takes extra time but able to perform sit to stand initial trial    Ambulation/Gait Ambulation/Gait assistance: Supervision, Min guard Gait Distance (Feet): 5 Feet Assistive device: Rolling walker (2 wheels) Gait Pattern/deviations: Decreased step length - right, Decreased step length - left, Decreased stride length Gait velocity: decreased     General Gait Details: patient on 5.5 L of O2; at end of transfer and short walk O2 remains at 95%  Stairs            Wheelchair Mobility    Modified Rankin (Stroke Patients Only)       Balance Overall balance assessment: Needs assistance Sitting-balance support: Feet supported, No upper extremity supported Sitting balance-Leahy Scale: Good Sitting balance - Comments: sits on EOB without an postural leaning   Standing balance support: During functional activity, Bilateral upper extremity supported Standing balance-Leahy Scale: Good Standing balance comment: good with RW and SBA/ CGA                             Pertinent Vitals/Pain Pain Assessment Pain Assessment: No/denies pain (has a hx of chronic back pain but denies today)    Home Living Family/patient expects to be discharged to:: Private  residence Living Arrangements: Children Available Help at Discharge: Family;Available 24 hours/day Type of Home: Mobile home Home Access: Stairs to enter Entrance Stairs-Rails: None Entrance Stairs-Number of Steps: 1 step   Home Layout: One level Home Equipment: Standard Walker;BSC/3in1;Wheelchair - manual;Cane - single point;Hospital bed Additional Comments: hospital bed in living room    Prior Function Prior Level of Function : Needs assist       Physical Assist : Mobility (physical);ADLs (physical) Mobility (physical): Bed mobility;Transfers;Gait;Stairs   Mobility Comments: household and very short distanced community using standard walker PRN ADLs Comments: assisted by family     Hand Dominance        Extremity/Trunk Assessment   Upper Extremity Assessment Upper Extremity Assessment: Generalized weakness    Lower Extremity Assessment Lower Extremity Assessment: Generalized weakness    Cervical / Trunk Assessment Cervical / Trunk Assessment: Kyphotic  Communication   Communication: No difficulties  Cognition Arousal/Alertness: Awake/alert Behavior During Therapy: WFL for tasks assessed/performed Overall Cognitive Status: Within Functional Limits for tasks assessed  General Comments: son at bedside        General Comments      Exercises     Assessment/Plan    PT Assessment Patient needs continued PT services  PT Problem List Decreased strength;Decreased activity tolerance;Decreased balance;Decreased mobility       PT Treatment Interventions DME instruction;Gait training;Stair training;Functional mobility training;Therapeutic activities;Therapeutic exercise;Patient/family education;Balance training    PT Goals (Current goals can be found in the Care Plan section)  Acute Rehab PT Goals Patient Stated Goal: return home with family to assist PT Goal Formulation: With patient/family Time For Goal  Achievement: 01/14/22 Potential to Achieve Goals: Good    Frequency Min 2X/week     Co-evaluation               AM-PAC PT "6 Clicks" Mobility  Outcome Measure Help needed turning from your back to your side while in a flat bed without using bedrails?: None Help needed moving from lying on your back to sitting on the side of a flat bed without using bedrails?: None Help needed moving to and from a bed to a chair (including a wheelchair)?: A Little Help needed standing up from a chair using your arms (e.g., wheelchair or bedside chair)?: A Little Help needed to walk in hospital room?: A Little Help needed climbing 3-5 steps with a railing? : A Lot 6 Click Score: 19    End of Session Equipment Utilized During Treatment: Oxygen Activity Tolerance: Patient tolerated treatment well;Patient limited by fatigue Patient left: in chair;with call bell/phone within reach;with family/visitor present Nurse Communication: Mobility status PT Visit Diagnosis: Unsteadiness on feet (R26.81);Other abnormalities of gait and mobility (R26.89);Muscle weakness (generalized) (M62.81)    Time: 4801-6553 PT Time Calculation (min) (ACUTE ONLY): 25 min   Charges:   PT Evaluation $PT Eval Low Complexity: 1 Low PT Treatments $Therapeutic Activity: 8-22 mins        2:03 PM, 12/31/21 Lashan Gluth Small Jeffifer Rabold MPT [place] [facility]  [phone]

## 2021-12-31 NOTE — Progress Notes (Signed)
Pt slept through the night. Rhonchi auscultated in upper and lower lobes bilaterally, non-productive cough noted. PIV placed in RFA, pt tolerated well. +1 edema present in bilateral upper and lower extremities.

## 2021-12-31 NOTE — Inpatient Diabetes Management (Signed)
Inpatient Diabetes Program Recommendations  AACE/ADA: New Consensus Statement on Inpatient Glycemic Control (2015)  Target Ranges:  Prepandial:   less than 140 mg/dL      Peak postprandial:   less than 180 mg/dL (1-2 hours)      Critically ill patients:  140 - 180 mg/dL     Latest Reference Range & Units 12/30/21 07:32 12/30/21 11:17 12/30/21 16:35 12/30/21 21:29  Glucose-Capillary 70 - 99 mg/dL 142 (H) 266 (H) 326 (H) 294 (H)   Review of Glycemic Control  Diabetes history: DM2 Outpatient Diabetes medications: Lantus 20 units BID, Janumet 50-1000 mg BID, Amaryl 4 mg daily Current orders for Inpatient glycemic control: Semglee 15 units daily, Novolog 0-15 units TID with meals; Solumedrol 40 mg daily   Inpatient Diabetes Program Recommendations:     Insulin: If steroids are continued as ordered, please consider ordering Novolog 5 units TID with meals for meal coverage if patient eats at least 50% of meals.    Thanks, Barnie Alderman, RN, MSN, [place] Diabetes Coordinator Inpatient Diabetes Program [phone] (Team Pager from 8am to [place])

## 2022-01-01 LAB — GLUCOSE, CAPILLARY
Glucose-Capillary: 161 mg/dL — ABNORMAL HIGH (ref 70–99)
Glucose-Capillary: 306 mg/dL — ABNORMAL HIGH (ref 70–99)
Glucose-Capillary: 269 mg/dL — ABNORMAL HIGH (ref 70–99)

## 2022-01-01 MED ORDER — ONDANSETRON HCL 4 MG PO TABS: 4.0000 mg | ORAL_TABLET | Freq: Four times a day (QID) | ORAL | 0 refills | Status: DC | PRN

## 2022-01-01 MED ORDER — PANTOPRAZOLE SODIUM 40 MG PO TBEC: 40.0000 mg | DELAYED_RELEASE_TABLET | Freq: Every day | ORAL | 2 refills | Status: DC

## 2022-01-01 MED ORDER — FLUCONAZOLE 200 MG PO TABS: 200.0000 mg | ORAL_TABLET | Freq: Every day | ORAL | 0 refills | Status: DC

## 2022-01-01 MED ORDER — GUAIFENESIN ER 600 MG PO TB12: 1200.0000 mg | ORAL_TABLET | Freq: Two times a day (BID) | ORAL | 0 refills | Status: DC | PRN

## 2022-01-01 MED ORDER — PREDNISONE 10 MG PO TABS: 10.0000 mg | ORAL_TABLET | Freq: Every day | ORAL | 0 refills | Status: DC

## 2022-01-01 MED ORDER — AMOXICILLIN-POT CLAVULANATE 875-125 MG PO TABS: 1.0000 | ORAL_TABLET | Freq: Two times a day (BID) | ORAL | 0 refills | Status: DC

## 2022-01-01 MED ORDER — ASPIRIN 81 MG PO TABS: 81.0000 mg | ORAL_TABLET | Freq: Every day | ORAL | 3 refills | Status: DC

## 2022-01-01 NOTE — Consult Note (Signed)
   [facility] Inpatient Consult   01/01/2022  KASSIDI ELZA [birth year] [id number]  [facility] [place] [facility] [[facility]] Patient:  [facility] Liaison remote coverage review for [facility]    Primary Care Provider: [name], MD, [facility]   Patient screened for readmission review less than 7 days hospitalization with noted length of stayto assess for potential [facility] service needs for post hospital transition for care coordination.  Review of patient's electronic medical record reveals patient is for transitioning home with home health per patient's electronic medical record.  Call placed to patient's bedside phone via assistance of hospital operator, a female answered the phone, and this writer asked for patient and patient identify himself as her son, [name], HIPAA confirmed. Explained the reason for the call and community care coordination needs. [name], states patient is illiterate and wouldn't be able to follow a telephonic program. Son states the home health to start and he could work closer with them for her needs.   Plan:  Referral request for community care coordination: patient's son declines at this time stating home health is enough but would like to have additional assistance but he states he will check with home health staff.  Of note, [facility] Care Management/Population Health does not replace or interfere with any arrangements made by the Inpatient Transition of Care team.  For questions contact:   Natividad Brood, RN BSN [place]  [phone] business mobile phone Toll free office [phone]  *[place]  [phone] Fax number: [phone] [email or web address]

## 2022-01-01 NOTE — Discharge Summary (Signed)
Michaela Moon, is a 86 y.o. female  DOB 1935-06-27  MRN [id number].  Admission date:  12/25/2021  Admitting Physician  Kathie Dike, MD  Discharge Date:  01/01/2022   Primary MD  [name], MD  Recommendations for primary care physician for things to follow:  1)Take 40 mg (4 tab) daily for 3 days , then 30 mg (3 Tab) daily for 3 days, then Take 20 mg (2 Tab) daily for 3 days, then Take 10 mg (10 mg) daily for 3 days... Then STOP  2)You are taking Eliquis/Apixaban which is a Blood Thinner so Avoid ibuprofen/Advil/Aleve/Motrin/Goody Powders/Naproxen/BC powders/Meloxicam/Diclofenac/Indomethacin and other Nonsteroidal anti-inflammatory medications as these will make you more likely to bleed and can cause stomach ulcers, can also cause Kidney problems.   3)Please repeat CBC and BMP blood test within 1 week  4)you need oxygen at home at 5 L via nasal cannula continuously while awake and while asleep--- smoking or having open fires around oxygen can cause fire, significant injury and death  Admission Diagnosis  Hypoglycemia [E16.2] Acute respiratory failure with hypoxia ([place]) [J96.01] Multifocal pneumonia [J18.9]   Discharge Diagnosis  Hypoglycemia [E16.2] Acute respiratory failure with hypoxia ([place]) [J96.01] Multifocal pneumonia [J18.9]  ***  Principal Problem:   Multifocal pneumonia Active Problems:   Dysphagia   Mixed hyperlipidemia   Peripheral neuropathy   Acute exacerbation of chronic obstructive pulmonary disease (COPD) (HCC)   GERD (gastroesophageal reflux disease)   Recurrent pneumonia   Acute on chronic respiratory failure with hypoxia (HCC)   Hypoglycemia   Insulin dependent type 2 diabetes mellitus ([place])   Hypothermia      Past Medical History:  Diagnosis Date   Arthritis    Asthma    COPD (chronic obstructive pulmonary disease) ([place])    Diabetes mellitus without  complication ([place])    GERD (gastroesophageal reflux disease)    Headache    Hyperlipidemia    Hypertension    Macular degeneration    Neuropathy    Obesity    Osteopenia    Stress incontinence     Past Surgical History:  Procedure Laterality Date   BREAST LUMPECTOMY Bilateral    no cancer   CARPAL TUNNEL RELEASE Left 10/23/2017   Procedure: LEFT CARPAL TUNNEL RELEASE;  Surgeon: Carole Civil, MD;  Location: [facility];  Service: Orthopedics;  Laterality: Left;   CARPAL TUNNEL RELEASE Right 11/20/2017   Procedure: CARPAL TUNNEL RELEASE;  Surgeon: Carole Civil, MD;  Location: [facility];  Service: Orthopedics;  Laterality: Right;   CATARACT EXTRACTION W/PHACO Left 11/01/2016   Procedure: CATARACT EXTRACTION PHACO AND INTRAOCULAR LENS PLACEMENT (IOC);  Surgeon: Baruch Goldmann, MD;  Location: [facility];  Service: Ophthalmology;  Laterality: Left;  CDE: 3.83   CATARACT EXTRACTION W/PHACO Right 11/29/2016   Procedure: CATARACT EXTRACTION PHACO AND INTRAOCULAR LENS PLACEMENT RIGHT EYE;  Surgeon: Baruch Goldmann, MD;  Location: [facility];  Service: Ophthalmology;  Laterality: Right;  CDE: 8.71   colon tumor removed  2010   Dr. Lindalou Hose -  right hemicolectomy for large intramural mass of hepatic flexure. submucosal lipoma on path   COLONOSCOPY  05/2013   Dr. Anthony Sar: normal. (h/o colon polyps)   COLONOSCOPY  2011   normal   COLONOSCOPY  10/2003   Dr. Lindalou Hose: large polypoid mass hepatic fluexure   ESOPHAGOGASTRODUODENOSCOPY N/A 04/29/2016   Procedure: ESOPHAGOGASTRODUODENOSCOPY (EGD);  Surgeon: Danie Binder, MD;  [facility];  Service: Endoscopy;  Laterality: N/A;  9:15am   SAVORY DILATION N/A 04/29/2016   Procedure: SAVORY DILATION;  Surgeon: Danie Binder, MD;  [facility];  Service: Endoscopy;  Laterality: N/A;     HPI  from the history and physical done on the day of admission:     Chief Complaint: Short of breath and hypoxia   HPI: Michaela Moon is a 86 y.o.  female with medical history significant of COPD, diabetes, hypertension, recently diagnosed bilateral lower extremity DVT on anticoagulation, who was unfortunately who has had multiple hospitalizations since September of this year when she was diagnosed with COVID.  She was treated with antivirals and steroids at the time of her diagnosis in September at [facility] and was discharged home with oxygen on 2 L.  Unfortunately, since that time she has had 2 admissions to [facility] with multifocal pneumonia.  She was treated with ceftriaxone and azithromycin on both occasions.  She was also treated with steroids and bronchodilators.  She was discharged home on 4 L of oxygen at the time of discharge.  After her last discharge, she reports that she was initially feeling well and did not have any shortness of breath.  She does have a chronic cough which she feels has not really changed.  Her son noted that she felt unwell last night, but her respiratory status appears to be near baseline.  This morning, he had noted her in bed and checked a pulse ox on her and it was noted to be in the 80s despite wearing 4 L of oxygen.  He proceeded to give her a breathing treatment which transiently improved her oxygen saturations into the 90s, but then subsequently began to drop back down to the 80s.  EMS was called and placed her on nonrebreather mask on their arrival.  Despite this, her oxygen saturations remained in the 80s.  On arrival to the emergency room, she was initially placed on BiPAP but did not tolerate this very well.  She was subsequently transitioned to high flow nasal cannula which appears to have had a better effect within oxygen saturations in the 90s.  She was profoundly hypoglycemic with blood sugar in the 20s to 30s and received dextrose.  She was also hypothermic.  Hemodynamics are otherwise stable.  CT scan of the chest was repeated and did not show any evidence of pulmonary embolism, but did show  persistent multifocal pneumonia.  BNP was checked and noted to be normal.  Currently on 15 L of oxygen via nasal cannula.  She has been referred for admission.     Review of Systems: As per HPI otherwise 10 point review of systems negative.      Hospital Course:     Brief Narrative:   86 y.o. female with medical history significant of COPD, diabetes, hypertension, recently diagnosed bilateral lower extremity DVT on anticoagulation admitted on 12/25/2021 with acute on chronic hypoxic respiratory failure in the setting of recurrent aspiration/multifocal pneumonia     -Assessment and Plan: 1)Recurrent Aspiration/Multifocal Pneumonia---pulmonology consult appreciated- -Respiratory sample Gram  stain with showing GPC in pairs and clusters as well as Candida albicans -Continue bronchial dilators and mucolytics 12/31/21 -Respiratory status and oxygen requirement improving -Chest x-ray on 12/31/2021 consistent with bilateral pneumonia worse on the right suspect aspiration related -Added Diflucan respiratory sample culture from 12/25/2021 reveals Candida albicans--- suspect oropharyngeal source -Completed azithromycin and vancomycin -De-escalate from cefepime to Rocephin for another 2 days   2)Acute on chronic hypoxic respiratory failure----secondary to #1 above -Weaned off continuous BiPAP 12/31/21 -Tolerated BiPAP well overnight--patient is encouraged to continue to use BiPAP nightly -Oxygen currently weaned down to  6 L via nasal cannula -PTA patient was on 4 L per nasal cannula -Management as above #1   3)Acute COPD exacerbation--- secondary to #1 above =-Antibiotics as above #1 -Respiratory Gram stain showing GPC in pairs and clusters  -Continue bronchodilators and mucolytic's -c/n steroids as ordered   4)HTN-c/n  metoprolol 75 mg twice daily to improve rate control and BP control    5)DM2- .  A1c 6.3 reflecting excellent diabetic control PTA - worsening hyperglycemia due to  steroids Use Novolog/Humalog Sliding scale insulin with Accu-Cheks/Fingersticks as ordered    6)GERD--stable, continue PPI especially while on steroids   7)Dysphagia--- recurrent aspiration concerns -Speech pathologist consults appreciated recommends dysphagia 2 (Mech soft); Nectar thickened liquids -Dr. Elsworth Soho discussed possible PEG tube placement with patient's son and patient -Respiratory status currently precludes PEG placement -Patient and son concerned that PEG placement will not improve her quality of life -She may try to be more compliant with dysphagia diet instead   8)Bil DVT-lower extremity venous Dopplers from 12/08/2021 noted -CTA angio chest from 12/08/2021 and repeat CTA chest 12/25/2021 without acute PE -Continue Eliquis twice daily   9) generalized weakness and deconditioning--physical therapy evaluation appreciated possible discharge home with home health   Disposition: The patient is from: Home              Anticipated d/c is to: Home with [facility]   Discharge Condition: ***  Follow UP     Consults obtained - ***  Diet and Activity recommendation:  As advised  Discharge Instructions    **** Discharge Instructions     Call MD for:  difficulty breathing, headache or visual disturbances   Complete by: As directed    Call MD for:  persistant dizziness or light-headedness   Complete by: As directed    Call MD for:  persistant nausea and vomiting   Complete by: As directed    Call MD for:  severe uncontrolled pain   Complete by: As directed    Call MD for:  temperature >100.4   Complete by: As directed    Diet - low sodium heart healthy   Complete by: As directed    Discharge instructions   Complete by: As directed    1)Take 40 mg (4 tab) daily for 3 days , then 30 mg (3 Tab) daily for 3 days, then Take 20 mg (2 Tab) daily for 3 days, then Take 10 mg (10 mg) daily for 3 days... Then STOP  2)You are taking Eliquis/Apixaban which is a Blood Thinner so Avoid  ibuprofen/Advil/Aleve/Motrin/Goody Powders/Naproxen/BC powders/Meloxicam/Diclofenac/Indomethacin and other Nonsteroidal anti-inflammatory medications as these will make you more likely to bleed and can cause stomach ulcers, can also cause Kidney problems.   3)Please repeat CBC and BMP blood test within 1 week  4)you need oxygen at home at 5 L via nasal cannula continuously while awake and while asleep--- smoking or having open fires around  oxygen can cause fire, significant injury and death   Increase activity slowly   Complete by: As directed          Discharge Medications     Allergies as of 01/01/2022       Reactions   Onion    wheezing        Medication List     TAKE these medications    albuterol (2.5 MG/3ML) 0.083% nebulizer solution Commonly known as: PROVENTIL Take 3 mLs (2.5 mg total) by nebulization every 6 (six) hours as needed for wheezing or shortness of breath.   albuterol-ipratropium 18-103 MCG/ACT inhaler Commonly known as: COMBIVENT Inhale 2 puffs into the lungs every 6 (six) hours as needed for wheezing.   amoxicillin-clavulanate 875-125 MG tablet Commonly known as: AUGMENTIN Take 1 tablet by mouth 2 (two) times daily for 5 days.   apixaban 5 MG Tabs tablet Commonly known as: ELIQUIS Take 1 tablet (5 mg total) by mouth 2 (two) times daily.   ARTIFICIAL TEARS OP Place 1 drop into both eyes 4 (four) times daily.   aspirin 81 MG tablet Take 1 tablet (81 mg total) by mouth daily with breakfast. What changed: when to take this   atorvastatin 80 MG tablet Commonly known as: LIPITOR ALTERNATE TAKING 1 TABLET EVERY OTHER DAY WITH 1/2 TABLET EVERY OTHERDAY   Breztri Aerosphere 160-9-4.8 MCG/ACT Aero Generic drug: Budeson-Glycopyrrol-Formoterol Inhale 2 puffs into the lungs in the morning and at bedtime.   CENTRUM SILVER PO Take 1 tablet by mouth daily.   docusate sodium 100 MG capsule Commonly known as: Colace Take 1 capsule (100 mg total) by  mouth daily.   ferrous sulfate 325 (65 FE) MG tablet Take 1 tablet (325 mg total) by mouth daily.   Fish Oil 1000 MG Caps Take 1,000 mg by mouth in the morning, at noon, and at bedtime.   fluconazole 200 MG tablet Commonly known as: DIFLUCAN Take 1 tablet (200 mg total) by mouth daily. Start taking on: January 02, 2022   fluticasone 50 MCG/ACT nasal spray Commonly known as: FLONASE Place 1 spray into both nostrils daily.   gabapentin 100 MG capsule Commonly known as: NEURONTIN Take 1 capsule (100 mg total) by mouth 3 (three) times daily.   glimepiride 2 MG tablet Commonly known as: AMARYL Take 4 mg by mouth daily with breakfast.   guaiFENesin 600 MG 12 hr tablet Commonly known as: Mucinex Take 2 tablets (1,200 mg total) by mouth 2 (two) times daily as needed for cough or to loosen phlegm.   HAIR/SKIN/NAILS PO Take 1 tablet by mouth daily. Takes once a day.   Lantus SoloStar 100 UNIT/ML Solostar Pen Generic drug: insulin glargine Inject 20 Units into the skin in the morning and at bedtime.   losartan-hydrochlorothiazide 100-12.5 MG tablet Commonly known as: HYZAAR TAKE ONE (1) TABLET EACH DAY What changed: See the new instructions.   metoprolol tartrate 50 MG tablet Commonly known as: LOPRESSOR Take 50 mg by mouth 2 (two) times daily.   montelukast 10 MG tablet Commonly known as: SINGULAIR TAKE ONE TABLET BY MOUTH AT BEDTIME   ondansetron 4 MG tablet Commonly known as: ZOFRAN Take 1 tablet (4 mg total) by mouth every 6 (six) hours as needed for nausea.   oxybutynin 5 MG tablet Commonly known as: DITROPAN Take 5 mg by mouth every 8 (eight) hours as needed for bladder spasms.   pantoprazole 40 MG tablet Commonly known as: PROTONIX Take 1 tablet (40 mg total)  by mouth daily.   polyethylene glycol 17 g packet Commonly known as: MIRALAX / GLYCOLAX Take 17 g by mouth daily as needed for mild constipation.   predniSONE 10 MG tablet Commonly known as:  DELTASONE Take 1 tablet (10 mg total) by mouth daily with breakfast. Take 40 mg (4 tab) daily for 3 days , then 30 mg (3 Tab) daily for 3 days, then Take 20 mg (2 Tab) daily for 3 days, then Take 10 mg (10 mg) daily for 3 days... Then STOP What changed:  medication strength how much to take how to take this when to take this additional instructions   sitaGLIPtin-metformin 50-1000 MG tablet Commonly known as: JANUMET Take 1 tablet by mouth 2 (two) times daily with a meal.   triamcinolone cream 0.1 % Commonly known as: KENALOG Apply 1 application topically daily as needed (eczema).               Durable Medical Equipment  (From admission, onward)           Start     Ordered   01/01/22 1343  For home use only DME oxygen  Once       Comments: SATURATION QUALIFICATIONS: (This note is used to comply with regulatory documentation for home oxygen)   Patient Saturations on Room Air at Rest = 85 %   Patient Saturations on Room Air while Ambulating = 82 %   Patient Saturations on 5 Liters of oxygen while Ambulating = 93 %    Patient needs continuous O2 at 5 L/min continuously via nasal cannula with humidifier, with gaseous portability and conserving device  Question Answer Comment  Length of Need Lifetime   Mode or (Route) Nasal cannula   Liters per Minute 5   Frequency Continuous (stationary and portable oxygen unit needed)   Oxygen conserving device Yes   Oxygen delivery system Gas      01/01/22 1343            Major procedures and Radiology Reports - PLEASE review detailed and final reports for all details, in brief -   ***  DG CHEST PORT 1 VIEW  Result Date: 12/31/2021 CLINICAL DATA:  [id number] with dyspnea and respiratory abnormalities. EXAM: PORTABLE CHEST 1 VIEW COMPARISON:  Portable 12/29/2021 FINDINGS: 5:01 a.m. The cardiac size is normal. The mediastinum is normally outlined. The aortic arch is heavily calcified. Patchy airspace disease in the mid to lower  lung fields, is unchanged on the left but mildly worsened on the right, with increased small right pleural effusion extending into the horizontal fissure. The upper lung fields remain clear. There is osteopenia and thoracic spondylosis. IMPRESSION: 1. Patchy airspace disease in the mid to lower lung fields bilaterally, mildly worsened on the right, with increased small right pleural effusion. 2. Aortic atherosclerosis. Electronically Signed   By: Telford Nab M.D.   On: 12/31/2021 07:16   DG CHEST PORT 1 VIEW  Result Date: 12/29/2021 CLINICAL DATA:  Dyspnea and respiratory abnormality EXAM: PORTABLE CHEST 1 VIEW COMPARISON:  12/25/2021 FINDINGS: Normal heart size and mediastinal contours. Persisting bilateral airspace disease. Pneumonia/aspiration findings by recent chest CT. No visible effusion or pneumothorax. Artifact from EKG leads. IMPRESSION: Unchanged bilateral pneumonia/aspiration. Electronically Signed   By: Jorje Guild M.D.   On: 12/29/2021 08:13   CT Angio Chest PE W and/or Wo Contrast  Result Date: 12/25/2021 CLINICAL DATA:  High clinical suspicion of pulmonary embolism, hypoxemia EXAM: CT ANGIOGRAPHY CHEST WITH CONTRAST TECHNIQUE: Multidetector CT imaging  of the chest was performed using the standard protocol during bolus administration of intravenous contrast. Multiplanar CT image reconstructions and MIPs were obtained to evaluate the vascular anatomy. RADIATION DOSE REDUCTION: This exam was performed according to the departmental dose-optimization program which includes automated exposure control, adjustment of the mA and/or kV according to patient size and/or use of iterative reconstruction technique. CONTRAST:  55m OMNIPAQUE IOHEXOL 350 MG/ML SOLN COMPARISON:  12/08/2021 FINDINGS: Cardiovascular: Atherosclerotic calcifications aorta and coronary arteries. Aorta normal caliber without aneurysm or dissection. No pericardial effusion. Pulmonary arteries well opacified and patent. No  evidence of pulmonary embolism. Mediastinum/Nodes: Esophagus unremarkable. Base of cervical region normal appearance. No thoracic adenopathy. Lungs/Pleura: Extensive BILATERAL airspace infiltrates consistent with multifocal pneumonia. No pleural effusion or pneumothorax. No discrete mass. Upper Abdomen: Small hepatic LEFT lobe and RIGHT renal cysts, simple features; no follow-up imaging recommended. Remaining visualized upper abdomen unremarkable. Musculoskeletal: No acute osseous findings. Review of the MIP images confirms the above findings. IMPRESSION: No evidence of pulmonary embolism. Extensive BILATERAL airspace infiltrates consistent with multifocal pneumonia. Scattered atherosclerotic calcifications including coronary arteries. Aortic Atherosclerosis (ICD10-I70.0). Electronically Signed   By: MLavonia DanaM.D.   On: 12/25/2021 11:00   DG Chest Port 1 View  Result Date: 12/25/2021 CLINICAL DATA:  Shortness of breath EXAM: PORTABLE CHEST 1 VIEW COMPARISON:  Previous studies including the examination of 12/13/2021 FINDINGS: Transverse diameter of heart is increased. Central pulmonary vessels are prominent. There are patchy alveolar densities in both parahilar regions and lower lung fields with interval worsening. Lateral costophrenic angles are indistinct. There is no pneumothorax. IMPRESSION: Cardiomegaly. Central pulmonary vessels are prominent. There is interval worsening of alveolar densities in parahilar regions and lower lung fields, especially in right lower lung field. Findings suggest pulmonary edema or multifocal pneumonia with interval worsening. Small bilateral pleural effusions. Electronically Signed   By: PElmer PickerM.D.   On: 12/25/2021 09:15   DG Swallowing Func-Speech Pathology  Result Date: 12/18/2021 Table formatting from the original result was not included. Objective Swallowing Evaluation: Type of Study: MBS-Modified Barium Swallow Study  Patient Details Name: Michaela Moon: [id number] of Birth: 8Jun 24, 1937Today's Date: 12/18/2021 Time: SLP Start Time (ACUTE ONLY): 177-SLP Stop Time (ACUTE ONLY): 13710SLP Time Calculation (min) (ACUTE ONLY): 29 min Past Medical History: Past Medical History: Diagnosis Date  Arthritis   Asthma   [place]   Diabetes mellitus without complication (HCC)   GERD (gastroesophageal reflux disease)   Headache   Hyperlipidemia   Hypertension   Macular degeneration   Neuropathy   Obesity   Osteopenia   Stress incontinence  Past Surgical History: Past Surgical History: Procedure Laterality Date  BREAST LUMPECTOMY Bilateral   no cancer  CARPAL TUNNEL RELEASE Left 10/23/2017  Procedure: LEFT CARPAL TUNNEL RELEASE;  Surgeon: HCarole Civil MD;  Location: [facility];  Service: Orthopedics;  Laterality: Left;  CARPAL TUNNEL RELEASE Right 11/20/2017  Procedure: CARPAL TUNNEL RELEASE;  Surgeon: HCarole Civil MD;  Location: [facility];  Service: Orthopedics;  Laterality: Right;  CATARACT EXTRACTION W/PHACO Left 11/01/2016  Procedure: CATARACT EXTRACTION PHACO AND INTRAOCULAR LENS PLACEMENT (IOC);  Surgeon: WBaruch Goldmann MD;  Location: [facility];  Service: Ophthalmology;  Laterality: Left;  CDE: 3.83  CATARACT EXTRACTION W/PHACO Right 11/29/2016  Procedure: CATARACT EXTRACTION PHACO AND INTRAOCULAR LENS PLACEMENT RIGHT EYE;  Surgeon: WBaruch Goldmann MD;  Location: [facility];  Service: Ophthalmology;  Laterality: Right;  CDE: 8.71  colon tumor removed  2010  Dr. 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 submucosal lipoma on path  COLONOSCOPY  05/2013  Dr. Anthony Sar: normal. (h/o colon polyps)  COLONOSCOPY  2011  normal  COLONOSCOPY  10/2003  Dr. Lindalou Hose: large polypoid mass hepatic fluexure  ESOPHAGOGASTRODUODENOSCOPY N/A 04/29/2016  Procedure: ESOPHAGOGASTRODUODENOSCOPY (EGD);  Surgeon: Danie Binder, MD;  [facility];  Service: Endoscopy;  Laterality: N/A;  9:15am  SAVORY  DILATION N/A 04/29/2016  Procedure: SAVORY DILATION;  Surgeon: Danie Binder, MD;  [facility];  Service: Endoscopy;  Laterality: N/A; HPI: 86 y.o. female with medical history significant of hypertension, T2DM, COPD, dysphagia requiring dilation who presents to the emergency department due to shortness of breath.  She was recently admitted from 10/4 to 10/10 due to acute on chronic hypoxemic respiratory failure secondary to multifocal pneumonia which was treated with IV Rocephin and azithromycin. Prior to this, she was admitted at [facility]- admitted 9/2 to 9/12 for acute respiratory failure, required BiPAP.  She was positive for COVID and treated with antibiotics and Paxlovid for multifocal pneumonia. [name] reports to me she has had PNA ~5-7 times in the past 2 years. CXR reveals: "Interval partial clearing of bibasilar airspace opacities, most  consistent with improving pneumonia. Continued follow-up recommended  to document complete clearing. No new findings identified." BSE requested.  Subjective: "I sometimes have trouble swallowing solid foods."  Recommendations for follow up therapy are one component of a multi-disciplinary discharge planning process, led by the attending physician.  Recommendations may be updated based on patient status, additional functional criteria and insurance authorization. Assessment / Plan / Recommendation   12/18/2021   2:00 PM Clinical Impressions Clinical Impression  Pt presents with oropharyngeal swallowing to be essentially within functional limits. Note occasional pharyngeal penetration with thin liquids via cup and more frequent and deep/frank penetration with thin via straw, however all penetrates are during the swallow and are completely cleared by pharyngeal squeeze and pharyngeal stripping wave. Puree and regular textures are consumed without incident. Pt reports that she takes meds with puree at home; barium tablet was assessed with puree and note breif  stasis of the tablet in the cervical esophagus visualized during esophageal sweep (no radiologist present to confirm) but an additional presentation of puree facilitated tablet passing through. Otherwise esophageal sweep was unremarkable. Recommend continue with a soft diet/D3 and thin liquids, however secondary to respiratory compromise and current deconditioning recommend avoid straws at this time. Recommend meds whole with puree and recommend universal aspiration precautions and esophageal precations. Above findings and recommendations reviewed with RN, Pt and [name]. There are no further ST needs noted at this time. ST will sign off SLP Visit Diagnosis Dysphagia, unspecified (R13.10) Impact on safety and function Mild aspiration risk     12/18/2021   2:00 PM Treatment Recommendations Treatment Recommendations Therapy as outlined in treatment plan below     12/18/2021   2:00 PM Prognosis Prognosis for Safe Diet Advancement Good   12/18/2021   2:00 PM Diet Recommendations SLP Diet Recommendations Thin liquid;Dysphagia 3 (Mech soft) solids Liquid Administration via Cup;Straw Medication Administration Whole meds with puree Compensations Slow rate     12/18/2021   2:00 PM Other Recommendations Oral Care Recommendations Oral care BID Other Recommendations Clarify dietary restrictions Follow Up Recommendations Follow physician's recommendations for discharge plan and follow up therapies   12/09/2021   3:00 PM Frequency and Duration  Speech Therapy Frequency (ACUTE ONLY) min 2x/week Treatment  Duration 1 week     12/18/2021   2:00 PM Oral Phase Oral Phase [facility]    12/18/2021   2:00 PM Pharyngeal Phase Pharyngeal Phase Impaired Pharyngeal- Thin Teaspoon WFL Pharyngeal- Thin Cup Penetration/Aspiration during swallow Pharyngeal- Thin Straw Penetration/Aspiration during swallow    12/18/2021   2:00 PM Cervical Esophageal Phase  Cervical Esophageal Phase WFL Amelia H. Roddie Mc, CCC-SLP Speech Language Pathologist [name] 12/18/2021, 3:11 PM                     DG Chest 1 View  Result Date: 12/13/2021 CLINICAL DATA:  Shortness of breath EXAM: CHEST  1 VIEW COMPARISON:  12/11/2021 FINDINGS: Mild cardiomegaly and vascular congestion. Mild interstitial prominence and lower lobe airspace opacities. Findings similar to prior study. No visible effusions. No acute bony abnormality. Chronic healed proximal left humeral fracture. IMPRESSION: Cardiomegaly with vascular congestion and probable mild interstitial edema. Lower lobe airspace opacities are unchanged. Electronically Signed   By: Rolm Baptise M.D.   On: 12/13/2021 01:04   DG Chest Port 1 View  Result Date: 12/11/2021 CLINICAL DATA:  5626 with acute respiratory failure. EXAM: PORTABLE CHEST 1 VIEW COMPARISON:  12/08/2021 portable chest and chest CT. FINDINGS: 4:49 a.m. There is cardiomegaly again noted and mild central vascular prominence, with aortic atherosclerosis. Stable mediastinum. Interstitial and patchy alveolar opacities in the mid to lower lung fields continue to be seen, with a basal gradient. There are minimal pleural effusions. There is improvement in opacities in the base of both lungs but significant opacity remains and is otherwise unaltered. The upper 1/3 of the lungs remain generally clear. There is osteopenia and degenerative change thoracic spine, chronic healed fracture deformity proximal left humerus. IMPRESSION: Improved aeration at the base of both lungs. No other noteworthy change in the bilateral lung opacities and underlying interstitial prominence. Cardiomegaly. Electronically Signed   By: Telford Nab M.D.   On: 12/11/2021 06:29   DG CHEST PORT 1 VIEW  Result Date: 12/08/2021 CLINICAL DATA:  Respiratory failure EXAM: PORTABLE CHEST 1 VIEW COMPARISON:  12/07/2021 FINDINGS: No significant change in AP portable chest radiograph. Cardiomegaly with bilateral interstitial and heterogeneous airspace opacity of the lung bases, and  probable small layering pleural effusions. Partially imaged chronic fracture deformity of the proximal left humerus. IMPRESSION: No significant change in AP portable chest radiograph. Cardiomegaly with bilateral interstitial and heterogeneous airspace opacity of the lung bases, and probable small layering pleural effusions. Electronically Signed   By: Delanna Ahmadi M.D.   On: 12/08/2021 16:16   ECHOCARDIOGRAM COMPLETE  Result Date: 12/08/2021    ECHOCARDIOGRAM REPORT   Patient Name:   Michaela Moon Date of Exam: 12/08/2021 Medical Rec #:  [id number]       Height:       65.0 in Accession #:    [id number]      Weight:       178.8 lb Date of Birth:  27-Mar-1935        BSA:          1.886 m Patient Age:    80 years        BP:           160/42 mmHg Patient Gender: F               HR:           68 bpm. Exam Location:  [facility] Procedure: 2D Echo, Cardiac Doppler and Color Doppler Indications:  CHF  History:        Patient has no prior history of Echocardiogram examinations.                 COPD; Risk Factors:Hypertension, Diabetes and Dyslipidemia.  Sonographer:    Wenda Low Referring Phys: [id number] OLADAPO ADEFESO IMPRESSIONS  1. Left ventricular ejection fraction, by estimation, is 60 to 65%. The left ventricle has normal function. The left ventricle has no regional wall motion abnormalities. The left ventricular internal cavity size was mildly dilated. There is mild left ventricular hypertrophy of the septal segment. Left ventricular diastolic parameters are consistent with Grade I diastolic dysfunction (impaired relaxation).  2. Right ventricular systolic function is normal. The right ventricular size is normal. There is normal pulmonary artery systolic pressure.  3. The mitral valve is grossly normal. No evidence of mitral valve regurgitation.  4. The aortic valve is tricuspid. Aortic valve regurgitation is not visualized. No aortic stenosis is present.  5. The inferior vena cava is normal in size with  greater than 50% respiratory variability, suggesting right atrial pressure of 3 mmHg. Comparison(s): No prior Echocardiogram. FINDINGS  Left Ventricle: Left ventricular ejection fraction, by estimation, is 60 to 65%. The left ventricle has normal function. The left ventricle has no regional wall motion abnormalities. The left ventricular internal cavity size was mildly dilated. There is  mild left ventricular hypertrophy of the septal segment. Left ventricular diastolic parameters are consistent with Grade I diastolic dysfunction (impaired relaxation). Right Ventricle: The right ventricular size is normal. No increase in right ventricular wall thickness. Right ventricular systolic function is normal. There is normal pulmonary artery systolic pressure. The tricuspid regurgitant velocity is 2.06 m/s, and  with an assumed right atrial pressure of 3 mmHg, the estimated right ventricular systolic pressure is 29.9 mmHg. Left Atrium: Left atrial size was normal in size. Right Atrium: Right atrial size was normal in size. Pericardium: There is no evidence of pericardial effusion. Mitral Valve: The mitral valve is grossly normal. Mild mitral annular calcification. No evidence of mitral valve regurgitation. MV peak gradient, 4.9 mmHg. The mean mitral valve gradient is 2.0 mmHg. Tricuspid Valve: The tricuspid valve is normal in structure. Tricuspid valve regurgitation is not demonstrated. No evidence of tricuspid stenosis. Aortic Valve: The aortic valve is tricuspid. There is mild aortic valve annular calcification. Aortic valve regurgitation is not visualized. No aortic stenosis is present. Aortic valve mean gradient measures 6.0 mmHg. Aortic valve peak gradient measures 12.1 mmHg. Aortic valve area, by VTI measures 2.03 cm. Pulmonic Valve: The pulmonic valve was normal in structure. Pulmonic valve regurgitation is not visualized. No evidence of pulmonic stenosis. Aorta: The aortic root is normal in size and structure.  Venous: The inferior vena cava is normal in size with greater than 50% respiratory variability, suggesting right atrial pressure of 3 mmHg. IAS/Shunts: No atrial level shunt detected by color flow Doppler.  LEFT VENTRICLE PLAX 2D LVIDd:         5.30 cm   Diastology LVIDs:         3.20 cm   LV e' medial:    10.00 cm/s LV PW:         1.20 cm   LV E/e' medial:  7.4 LV IVS:        1.00 cm   LV e' lateral:   8.05 cm/s LVOT diam:     1.90 cm   LV E/e' lateral: 9.2 LV SV:  77 LV SV Index:   41 LVOT Area:     2.84 cm  RIGHT VENTRICLE RV Basal diam:  3.30 cm RV Mid diam:    2.40 cm RV S prime:     15.70 cm/s TAPSE (M-mode): 3.0 cm LEFT ATRIUM             Index        RIGHT ATRIUM           Index LA diam:        4.00 cm 2.12 cm/m   RA Area:     14.80 cm LA Vol (A2C):   52.9 ml 28.05 ml/m  RA Volume:   38.50 ml  20.41 ml/m LA Vol (A4C):   54.0 ml 28.63 ml/m LA Biplane Vol: 55.5 ml 29.43 ml/m  AORTIC VALVE                     PULMONIC VALVE AV Area (Vmax):    1.66 cm      PV Vmax:       1.05 m/s AV Area (Vmean):   1.69 cm      PV Peak grad:  4.4 mmHg AV Area (VTI):     2.03 cm AV Vmax:           174.00 cm/s AV Vmean:          118.000 cm/s AV VTI:            0.380 m AV Peak Grad:      12.1 mmHg AV Mean Grad:      6.0 mmHg LVOT Vmax:         102.00 cm/s LVOT Vmean:        70.400 cm/s LVOT VTI:          0.272 m LVOT/AV VTI ratio: 0.72  AORTA Ao Root diam: 3.20 cm MITRAL VALVE                TRICUSPID VALVE MV Area (PHT): 2.92 cm     TR Peak grad:   17.0 mmHg MV Area VTI:   2.38 cm     TR Vmax:        206.00 cm/s MV Peak grad:  4.9 mmHg MV Mean grad:  2.0 mmHg     SHUNTS MV Vmax:       1.11 m/s     Systemic VTI:  0.27 m MV Vmean:      63.0 cm/s    Systemic Diam: 1.90 cm MV Decel Time: 260 msec MV E velocity: 73.70 cm/s MV A velocity: 104.00 cm/s MV E/A ratio:  0.71 Rudean Haskell MD Electronically signed by Rudean Haskell MD Signature Date/Time: 12/08/2021/2:14:12 PM    Final    CT Angio Chest  Pulmonary Embolism (PE) W or WO Contrast  Result Date: 12/08/2021 CLINICAL DATA:  Positive D-dimer.  DVT EXAM: CT ANGIOGRAPHY CHEST WITH CONTRAST TECHNIQUE: Multidetector CT imaging of the chest was performed using the standard protocol during bolus administration of intravenous contrast. Multiplanar CT image reconstructions and MIPs were obtained to evaluate the vascular anatomy. RADIATION DOSE REDUCTION: This exam was performed according to the departmental dose-optimization program which includes automated exposure control, adjustment of the mA and/or kV according to patient size and/or use of iterative reconstruction technique. CONTRAST:  47m OMNIPAQUE IOHEXOL 350 MG/ML SOLN COMPARISON:  11/28/2021 FINDINGS: Cardiovascular: Satisfactory opacification of the pulmonary arteries to the segmental level. No evidence of pulmonary embolism. Normal heart size. No pericardial effusion. Extensive  atheromatous calcification of the aorta and coronaries Mediastinum/Nodes: Negative for adenopathy. Lungs/Pleura: Unchanged pattern of multifocal consolidation affecting all lobes with ground-glass, nodular, and consolidative opacities. Lower lobe airspace opacity is new/progressed from July 2023 but remaining opacity is very similar. Central airways are clear. Mild emphysematous change. In an otherwise clear area of lung there is an 8 mm irregular pulmonary nodule which is known from prior CTs. Upper Abdomen: Atheromatous plaque. Dystrophic type calcification in the right posterior liver. Granulomatous calcifications in the spleen. Musculoskeletal: No acute finding. Generalized thoracic spine degeneration. Review of the MIP images confirms the above findings. IMPRESSION: 1. Negative for pulmonary embolism. 2. Multi lobar airspace disease much of which is chronic when compared to a July 2023 CT. Lingular opacity is definitely chronic when compared to a January 2023 CT, and progressive. Consider atypical and non infectious  pneumonias. Consider alveolar tumor. 3. Known spiculated nodule in the right upper lobe. Electronically Signed   By: Jorje Guild M.D.   On: 12/08/2021 12:02   US Venous Img Lower Bilateral (DVT)  Result Date: 12/08/2021 CLINICAL DATA:  Bilateral lower extremity edema. EXAM: BILATERAL LOWER EXTREMITY VENOUS DOPPLER ULTRASOUND TECHNIQUE: Gray-scale sonography with graded compression, as well as color Doppler and duplex ultrasound were performed to evaluate the lower extremity deep venous systems from the level of the common femoral vein and including the common femoral, femoral, profunda femoral, popliteal and calf veins including the posterior tibial, peroneal and gastrocnemius veins when visible. The superficial great saphenous vein was also interrogated. Spectral Doppler was utilized to evaluate flow at rest and with distal augmentation maneuvers in the common femoral, femoral and popliteal veins. COMPARISON:  None Available. FINDINGS: RIGHT LOWER EXTREMITY Common Femoral Vein: No evidence of thrombus. Normal compressibility, respiratory phasicity and response to augmentation. Saphenofemoral Junction: No evidence of thrombus. Normal compressibility and flow on color Doppler imaging. Profunda Femoral Vein: No evidence of thrombus. Normal compressibility and flow on color Doppler imaging. Femoral Vein: No evidence of thrombus. Normal compressibility, respiratory phasicity and response to augmentation. Popliteal Vein: No evidence of thrombus. Normal compressibility, respiratory phasicity and response to augmentation. Calf Veins: Normal patency of posterior tibial vein. There is some visualized thrombus in the right peroneal vein which appears occlusive in the proximal calf. Gastrocnemius thrombus identified in the right calf. Superficial Great Saphenous Vein: No evidence of thrombus. Normal compressibility. Venous Reflux:  None. Other Findings: No evidence of superficial thrombophlebitis or abnormal fluid  collection. LEFT LOWER EXTREMITY Common Femoral Vein: No evidence of thrombus. Normal compressibility, respiratory phasicity and response to augmentation. Saphenofemoral Junction: No evidence of thrombus. Normal compressibility and flow on color Doppler imaging. Profunda Femoral Vein: No evidence of thrombus. Normal compressibility and flow on color Doppler imaging. Femoral Vein: No evidence of thrombus. Normal compressibility, respiratory phasicity and response to augmentation. Popliteal Vein: No evidence of thrombus. Normal compressibility, respiratory phasicity and response to augmentation. Calf Veins: Posterior tibial vein is normally patent. Left peroneal vein DVT visualized. Superficial Great Saphenous Vein: No evidence of thrombus. Normal compressibility. Venous Reflux:  None. Other Findings: No evidence of superficial thrombophlebitis or abnormal fluid collection. IMPRESSION: Positive for bilateral calf DVT in the peroneal veins and right gastrocnemius vein. Electronically Signed   By: Aletta Edouard M.D.   On: 12/08/2021 11:50   DG Chest Port 1 View  Result Date: 12/07/2021 CLINICAL DATA:  Questionable sepsis. Evaluate for abnormality. Hypoxemia. History of asthma/COPD. EXAM: PORTABLE CHEST 1 VIEW COMPARISON:  Radiographs 11/29/2021 and 11/28/2021.  CT 11/28/2021. FINDINGS:  1509 hours. The heart size and mediastinal contours appear stable. Compared with the most recent studies, there is partial clearing of the bibasilar airspace opacities, most consistent with improving pneumonia. No pneumothorax or significant pleural effusion identified. No acute osseous findings are seen. Telemetry leads overlie the chest. There is multilevel thoracic spondylosis. IMPRESSION: Interval partial clearing of bibasilar airspace opacities, most consistent with improving pneumonia. Continued follow-up recommended to document complete clearing. No new findings identified. Electronically Signed   By: Richardean Sale M.D.    On: 12/07/2021 15:27    Micro Results   *** Recent Results (from the past 240 hour(s))  Culture, blood (routine x 2)     Status: None   Collection Time: 12/25/21  8:25 AM   Specimen: Right Antecubital; Blood  Result Value Ref Range Status   Specimen Description RIGHT ANTECUBITAL  Final   Special Requests   Final    BOTTLES DRAWN AEROBIC AND ANAEROBIC Blood Culture adequate volume   Culture   Final    NO GROWTH 5 DAYS Performed at [facility], [address], [place], [place] [address]    Report Status 12/30/2021 FINAL  Final  Culture, blood (routine x 2)     Status: None   Collection Time: 12/25/21  8:40 AM   Specimen: Left Antecubital; Blood  Result Value Ref Range Status   Specimen Description LEFT ANTECUBITAL  Final   Special Requests   Final    BOTTLES DRAWN AEROBIC AND ANAEROBIC Blood Culture adequate volume   Culture   Final    NO GROWTH 5 DAYS Performed at [facility], [address], [place], [place] [address]    Report Status 12/30/2021 FINAL  Final  SARS Coronavirus 2 by RT PCR (hospital order, performed in [facility] hospital lab) *cepheid single result test* Anterior Nasal Swab     Status: None   Collection Time: 12/25/21  9:08 AM   Specimen: Anterior Nasal Swab  Result Value Ref Range Status   SARS Coronavirus 2 by RT PCR NEGATIVE NEGATIVE Final    Comment: (NOTE) SARS-CoV-2 target nucleic acids are NOT DETECTED.  The SARS-CoV-2 RNA is generally detectable in upper and lower respiratory specimens during the acute phase of infection. The lowest concentration of SARS-CoV-2 viral copies this assay can detect is 250 copies / mL. A negative result does not preclude SARS-CoV-2 infection and should not be used as the sole basis for treatment or other patient management decisions.  A negative result may occur with improper specimen collection / handling, submission of specimen other than nasopharyngeal swab, presence of viral mutation(s) within the areas targeted  by this assay, and inadequate number of viral copies (<250 copies / mL). A negative result must be combined with clinical observations, patient history, and epidemiological information.  Fact Sheet for Patients:   https://www.patel.info/  Fact Sheet for Healthcare Providers: https://hall.com/  This test is not yet approved or  cleared by the [place] FDA and has been authorized for detection and/or diagnosis of SARS-CoV-2 by FDA under an Emergency Use Authorization (EUA).  This EUA will remain in effect (meaning this test can be used) for the duration of the COVID-19 declaration under Section 564(b)(1) of the Act, 21 U.S.C. section 360bbb-3(b)(1), unless the authorization is terminated or revoked sooner.  Performed at [facility], [address], [place],  [address]   Culture, blood (Routine X 2) w Reflex to ID Panel     Status: None   Collection Time: 12/25/21  1:59 PM  Specimen: BLOOD LEFT HAND  Result Value Ref Range Status   Specimen Description BLOOD LEFT HAND  Final   Special Requests   Final    BOTTLES DRAWN AEROBIC ONLY Blood Culture adequate volume   Culture   Final    NO GROWTH 5 DAYS Performed at [facility], [address] [place], [place] [address]    Report Status 12/30/2021 FINAL  Final  Culture, blood (Routine X 2) w Reflex to ID Panel     Status: None   Collection Time: 12/25/21  1:59 PM   Specimen: BLOOD RIGHT HAND  Result Value Ref Range Status   Specimen Description BLOOD RIGHT HAND  Final   Special Requests   Final    BOTTLES DRAWN AEROBIC ONLY Blood Culture results may not be optimal due to an inadequate volume of blood received in culture bottles   Culture   Final    NO GROWTH 5 DAYS Performed at [facility], [address] [place], [place] [address]    Report Status 12/30/2021 FINAL  Final  Expectorated Sputum Assessment w Gram Stain, Rflx to Resp Cult     Status: None   Collection Time:  12/25/21  7:30 PM   Specimen: Expectorated Sputum  Result Value Ref Range Status   Specimen Description EXPECTORATED SPUTUM  Final   Special Requests NONE  Final   Sputum evaluation   Final    THIS SPECIMEN IS ACCEPTABLE FOR SPUTUM CULTURE Performed at [facility], [address], [place], [place] [address]    Report Status 12/25/2021 FINAL  Final  Culture, Respiratory w Gram Stain     Status: None   Collection Time: 12/25/21  7:30 PM  Result Value Ref Range Status   Specimen Description   Final    EXPECTORATED SPUTUM Performed at [facility], [address] [place], [place] [address]    Special Requests   Final    NONE Reflexed from [id number] Performed at [facility], [address] [place], [place] [address]    Gram Stain   Final    MODERATE WBC PRESENT,BOTH PMN AND MONONUCLEAR RARE GRAM POSITIVE COCCI IN PAIRS IN CLUSTERS Performed at [facility], [place] [address], [place], [place] [address]    Culture FEW CANDIDA ALBICANS  Final   Report Status 12/28/2021 FINAL  Final    Today   Subjective    Michaela Moon today has no ***          Patient has been seen and examined prior to discharge   Objective   Blood pressure 133/65, pulse 76, temperature 98.8 F (37.1 C), temperature source Oral, resp. rate 14, height '5\' 5"'$  (1.651 m), weight 78.3 kg, SpO2 94 %.   Intake/Output Summary (Last 24 hours) at 01/01/2022 1634 Last data filed at 01/01/2022 1300 Gross per 24 hour  Intake 1060.11 ml  Output 800 ml  Net 260.11 ml    Exam Gen:- Awake Alert, able to speak in short sentences  HEENT:- [place].AT, No sclera icterus, Nose-- [place] 6L/min Neck-Supple Neck,No JVD,.  Lungs-improving air movement, scattered rhonchi and wheezes bilaterally  CV- S1, S2 normal, regular , tachycardic Abd-  +ve B.Sounds, Abd Soft, No tenderness,    Extremity/Skin:- No  edema, pedal pulses present  Psych-affect is appropriate, oriented x3 Neuro-generalized weakness, no new focal deficits, no  tremors   Data Review   CBC w Diff:  Lab Results  Component Value Date   WBC 9.9 12/31/2021   HGB 9.1 (L) 12/31/2021  HGB 10.9 (L) 10/19/2020   HCT 28.2 (L) 12/31/2021   HCT 34.5 10/19/2020   PLT 109 (L) 12/31/2021   PLT 188 10/19/2020   LYMPHOPCT 6 12/25/2021   MONOPCT 3 12/25/2021   EOSPCT 0 12/25/2021   BASOPCT 0 12/25/2021    CMP:  Lab Results  Component Value Date   NA 136 12/30/2021   NA 139 10/19/2020   K 4.1 12/30/2021   CL 105 12/30/2021   CO2 26 12/30/2021   BUN 16 12/30/2021   BUN 12 10/19/2020   CREATININE 0.46 12/30/2021   PROT 5.8 (L) 12/26/2021   PROT 7.3 10/19/2020   ALBUMIN 2.9 (L) 12/26/2021   ALBUMIN 4.2 10/19/2020   BILITOT 0.6 12/26/2021   BILITOT 0.3 10/19/2020   ALKPHOS 38 12/26/2021   AST 19 12/26/2021   ALT 69 (H) 12/26/2021  .  Total Discharge time is about 33 minutes  Roxan Hockey M.D on 01/01/2022 at 4:34 PM  Go to [email or web address] -  for contact info  [facility] - Office  [phone]

## 2022-01-01 NOTE — Progress Notes (Signed)
     SATURATION QUALIFICATIONS: (This note is used to comply with regulatory documentation for home oxygen)   Patient Saturations on Room Air at Rest = 85 %   Patient Saturations on Room Air while Ambulating = 82 %   Patient Saturations on 5 Liters of oxygen while Ambulating = 93 %    Patient needs continuous O2 at 5 L/min continuously via nasal cannula with humidifier, with gaseous portability and conserving device    Roxan Hockey, MD

## 2022-01-01 NOTE — Inpatient Diabetes Management (Signed)
Inpatient Diabetes Program Recommendations  AACE/ADA: New Consensus Statement on Inpatient Glycemic Control   Target Ranges:  Prepandial:   less than 140 mg/dL      Peak postprandial:   less than 180 mg/dL (1-2 hours)      Critically ill patients:  140 - 180 mg/dL    Latest Reference Range & Units 12/31/21 07:48 12/31/21 11:29 12/31/21 16:18 12/31/21 21:09 01/01/22 07:31  Glucose-Capillary 70 - 99 mg/dL 128 (H) 209 (H) 333 (H) 259 (H) 161 (H)   Review of Glycemic Control  Diabetes history: DM2 Outpatient Diabetes medications: Lantus 20 units BID, Janumet 50-1000 mg BID, Amaryl 4 mg daily Current orders for Inpatient glycemic control: Semglee 15 units daily, Novolog 0-15 units TID with meals; Solumedrol 40 mg daily   Inpatient Diabetes Program Recommendations:     Insulin: Post prandial glucose consistently elevated; up to 333 mg/dl on 12/31/21.  If steroids are continued as ordered, please consider ordering Novolog 5 units TID with meals for meal coverage if patient eats at least 50% of meals.    Thanks, Barnie Alderman, RN, MSN, [place] Diabetes Coordinator Inpatient Diabetes Program [phone] (Team Pager from 8am to [place])

## 2022-01-01 NOTE — Care Management Important Message (Signed)
Important Message  Patient Details  Name: Michaela Moon MRN: [id number] Date of Birth: 07-27-1935   Medicare Important Message Given:  Yes     Tommy Medal 01/01/2022, 2:14 PM

## 2022-01-01 NOTE — TOC Transition Note (Signed)
Transition of Care [facility]) - CM/SW Discharge Note   Patient Details  Name: Michaela Moon MRN: [id number] Date of Birth: July 30, 1935  Transition of Care (TOC) CM/SW Contact:  Joaquin Courts, RN Phone Number: 01/01/2022, 1:54 PM   Clinical Narrative:    Cm noted [facility] orders placed, patient active with [facility].  Notified [employer] of modified oxygen needs and orders placed.  No further TOC needs identified.     Barriers to Discharge: Continued Medical Work up   Patient Goals and CMS Choice Patient states their goals for this hospitalization and ongoing recovery are:: get better CMS Medicare.gov Compare Post Acute Care list provided to:: Patient Choice offered to / list presented to : Patient  Discharge Placement                       Discharge Plan and Services In-house Referral: Clinical Social Work Discharge Planning Services: CM Consult Post Acute Care Choice: [place]                               Social Determinants of Health (SDOH) Interventions     Readmission Risk Interventions    12/31/2021    3:22 PM  Readmission Risk Prevention Plan  Transportation Screening Complete  PCP or Specialist Appt within 5-7 Days Complete  Home Care Screening Complete  Medication Review (RN CM) Complete

## 2022-01-01 NOTE — Discharge Instructions (Signed)
1)Take 40 mg (4 tab) daily for 3 days , then 30 mg (3 Tab) daily for 3 days, then Take 20 mg (2 Tab) daily for 3 days, then Take 10 mg (10 mg) daily for 3 days... Then STOP  2)You are taking Eliquis/Apixaban which is a Blood Thinner so Avoid ibuprofen/Advil/Aleve/Motrin/Goody Powders/Naproxen/BC powders/Meloxicam/Diclofenac/Indomethacin and other Nonsteroidal anti-inflammatory medications as these will make you more likely to bleed and can cause stomach ulcers, can also cause Kidney problems.   3)Please repeat CBC and BMP blood test within 1 week  4)you need oxygen at home at 5 L via nasal cannula continuously while awake and while asleep--- smoking or having open fires around oxygen can cause fire, significant injury and death

## 2022-01-02 DIAGNOSIS — U071 COVID-19: Secondary | ICD-10-CM | POA: Diagnosis not present

## 2022-01-02 DIAGNOSIS — I509 Heart failure, unspecified: Secondary | ICD-10-CM | POA: Diagnosis not present

## 2022-01-02 DIAGNOSIS — E1142 Type 2 diabetes mellitus with diabetic polyneuropathy: Secondary | ICD-10-CM | POA: Diagnosis not present

## 2022-01-02 DIAGNOSIS — J9621 Acute and chronic respiratory failure with hypoxia: Secondary | ICD-10-CM | POA: Diagnosis not present

## 2022-01-02 DIAGNOSIS — J440 Chronic obstructive pulmonary disease with acute lower respiratory infection: Secondary | ICD-10-CM | POA: Diagnosis not present

## 2022-01-03 DIAGNOSIS — E1142 Type 2 diabetes mellitus with diabetic polyneuropathy: Secondary | ICD-10-CM | POA: Diagnosis not present

## 2022-01-03 DIAGNOSIS — U071 COVID-19: Secondary | ICD-10-CM | POA: Diagnosis not present

## 2022-01-03 DIAGNOSIS — J440 Chronic obstructive pulmonary disease with acute lower respiratory infection: Secondary | ICD-10-CM | POA: Diagnosis not present

## 2022-01-03 DIAGNOSIS — J9621 Acute and chronic respiratory failure with hypoxia: Secondary | ICD-10-CM | POA: Diagnosis not present

## 2022-01-03 DIAGNOSIS — I509 Heart failure, unspecified: Secondary | ICD-10-CM | POA: Diagnosis not present

## 2022-01-04 ENCOUNTER — Emergency Department (HOSPITAL_COMMUNITY): Payer: Medicare Other

## 2022-01-04 ENCOUNTER — Encounter (HOSPITAL_COMMUNITY): Payer: Self-pay | Admitting: Emergency Medicine

## 2022-01-04 ENCOUNTER — Inpatient Hospital Stay (HOSPITAL_COMMUNITY)
Admission: EM | Admit: 2022-01-04 | Discharge: 2022-01-12 | DRG: 189 | Disposition: A | Discharge status: Home Health | Payer: Medicare Other | Attending: Family Medicine | Admitting: Family Medicine

## 2022-01-04 ENCOUNTER — Other Ambulatory Visit: Payer: Self-pay

## 2022-01-04 DIAGNOSIS — E1140 Type 2 diabetes mellitus with diabetic neuropathy, unspecified: Secondary | ICD-10-CM | POA: Diagnosis not present

## 2022-01-04 DIAGNOSIS — Z8616 Personal history of COVID-19: Secondary | ICD-10-CM

## 2022-01-04 DIAGNOSIS — R6520 Severe sepsis without septic shock: Secondary | ICD-10-CM | POA: Diagnosis not present

## 2022-01-04 DIAGNOSIS — T380X5A Adverse effect of glucocorticoids and synthetic analogues, initial encounter: Secondary | ICD-10-CM | POA: Diagnosis not present

## 2022-01-04 DIAGNOSIS — Z515 Encounter for palliative care: Secondary | ICD-10-CM | POA: Diagnosis not present

## 2022-01-04 DIAGNOSIS — E872 Acidosis, unspecified: Secondary | ICD-10-CM | POA: Diagnosis not present

## 2022-01-04 DIAGNOSIS — Z79899 Other long term (current) drug therapy: Secondary | ICD-10-CM

## 2022-01-04 DIAGNOSIS — Z7189 Other specified counseling: Secondary | ICD-10-CM | POA: Diagnosis not present

## 2022-01-04 DIAGNOSIS — Z86718 Personal history of other venous thrombosis and embolism: Secondary | ICD-10-CM

## 2022-01-04 DIAGNOSIS — J9621 Acute and chronic respiratory failure with hypoxia: Principal | ICD-10-CM | POA: Diagnosis present

## 2022-01-04 DIAGNOSIS — Z794 Long term (current) use of insulin: Secondary | ICD-10-CM | POA: Diagnosis not present

## 2022-01-04 DIAGNOSIS — R1310 Dysphagia, unspecified: Secondary | ICD-10-CM | POA: Diagnosis not present

## 2022-01-04 DIAGNOSIS — I110 Hypertensive heart disease with heart failure: Secondary | ICD-10-CM | POA: Diagnosis not present

## 2022-01-04 DIAGNOSIS — Z66 Do not resuscitate: Secondary | ICD-10-CM | POA: Diagnosis present

## 2022-01-04 DIAGNOSIS — Z7982 Long term (current) use of aspirin: Secondary | ICD-10-CM

## 2022-01-04 DIAGNOSIS — I700 Atherosclerosis of aorta: Secondary | ICD-10-CM | POA: Diagnosis not present

## 2022-01-04 DIAGNOSIS — J449 Chronic obstructive pulmonary disease, unspecified: Secondary | ICD-10-CM

## 2022-01-04 DIAGNOSIS — R0602 Shortness of breath: Secondary | ICD-10-CM | POA: Diagnosis not present

## 2022-01-04 DIAGNOSIS — E669 Obesity, unspecified: Secondary | ICD-10-CM | POA: Diagnosis present

## 2022-01-04 DIAGNOSIS — E119 Type 2 diabetes mellitus without complications: Secondary | ICD-10-CM

## 2022-01-04 DIAGNOSIS — R54 Age-related physical debility: Secondary | ICD-10-CM | POA: Diagnosis present

## 2022-01-04 DIAGNOSIS — Z7901 Long term (current) use of anticoagulants: Secondary | ICD-10-CM

## 2022-01-04 DIAGNOSIS — Z9981 Dependence on supplemental oxygen: Secondary | ICD-10-CM

## 2022-01-04 DIAGNOSIS — E11649 Type 2 diabetes mellitus with hypoglycemia without coma: Secondary | ICD-10-CM | POA: Diagnosis not present

## 2022-01-04 DIAGNOSIS — I10 Essential (primary) hypertension: Secondary | ICD-10-CM | POA: Diagnosis not present

## 2022-01-04 DIAGNOSIS — I5032 Chronic diastolic (congestive) heart failure: Secondary | ICD-10-CM | POA: Diagnosis present

## 2022-01-04 DIAGNOSIS — J189 Pneumonia, unspecified organism: Secondary | ICD-10-CM

## 2022-01-04 DIAGNOSIS — M8580 Other specified disorders of bone density and structure, unspecified site: Secondary | ICD-10-CM | POA: Diagnosis present

## 2022-01-04 DIAGNOSIS — E1165 Type 2 diabetes mellitus with hyperglycemia: Secondary | ICD-10-CM | POA: Diagnosis not present

## 2022-01-04 DIAGNOSIS — A419 Sepsis, unspecified organism: Secondary | ICD-10-CM

## 2022-01-04 DIAGNOSIS — K219 Gastro-esophageal reflux disease without esophagitis: Secondary | ICD-10-CM | POA: Diagnosis present

## 2022-01-04 DIAGNOSIS — J690 Pneumonitis due to inhalation of food and vomit: Secondary | ICD-10-CM | POA: Diagnosis present

## 2022-01-04 DIAGNOSIS — L89151 Pressure ulcer of sacral region, stage 1: Secondary | ICD-10-CM | POA: Diagnosis present

## 2022-01-04 DIAGNOSIS — R0600 Dyspnea, unspecified: Secondary | ICD-10-CM | POA: Diagnosis not present

## 2022-01-04 DIAGNOSIS — Z91018 Allergy to other foods: Secondary | ICD-10-CM

## 2022-01-04 DIAGNOSIS — J441 Chronic obstructive pulmonary disease with (acute) exacerbation: Secondary | ICD-10-CM | POA: Diagnosis not present

## 2022-01-04 DIAGNOSIS — M1990 Unspecified osteoarthritis, unspecified site: Secondary | ICD-10-CM | POA: Diagnosis present

## 2022-01-04 DIAGNOSIS — Z87891 Personal history of nicotine dependence: Secondary | ICD-10-CM

## 2022-01-04 DIAGNOSIS — H3530 Unspecified macular degeneration: Secondary | ICD-10-CM | POA: Diagnosis present

## 2022-01-04 DIAGNOSIS — E782 Mixed hyperlipidemia: Secondary | ICD-10-CM | POA: Diagnosis present

## 2022-01-04 DIAGNOSIS — R918 Other nonspecific abnormal finding of lung field: Secondary | ICD-10-CM | POA: Diagnosis not present

## 2022-01-04 DIAGNOSIS — Z8701 Personal history of pneumonia (recurrent): Secondary | ICD-10-CM

## 2022-01-04 DIAGNOSIS — Z6829 Body mass index (BMI) 29.0-29.9, adult: Secondary | ICD-10-CM | POA: Diagnosis not present

## 2022-01-04 DIAGNOSIS — I2510 Atherosclerotic heart disease of native coronary artery without angina pectoris: Secondary | ICD-10-CM | POA: Diagnosis not present

## 2022-01-04 DIAGNOSIS — Z7951 Long term (current) use of inhaled steroids: Secondary | ICD-10-CM

## 2022-01-04 DIAGNOSIS — L8990 Pressure ulcer of unspecified site, unspecified stage: Secondary | ICD-10-CM | POA: Diagnosis present

## 2022-01-04 DIAGNOSIS — Z9049 Acquired absence of other specified parts of digestive tract: Secondary | ICD-10-CM

## 2022-01-04 LAB — CBC WITH DIFFERENTIAL/PLATELET
Abs Immature Granulocytes: 0.11 10*3/uL — ABNORMAL HIGH (ref 0.00–0.07)
Basophils Absolute: 0 10*3/uL (ref 0.0–0.1)
Basophils Relative: 0 %
Eosinophils Absolute: 0.1 10*3/uL (ref 0.0–0.5)
Eosinophils Relative: 1 %
HCT: 35.7 % — ABNORMAL LOW (ref 36.0–46.0)
Hemoglobin: 10.9 g/dL — ABNORMAL LOW (ref 12.0–15.0)
Immature Granulocytes: 1 %
Lymphocytes Relative: 16 %
Lymphs Abs: 2.5 10*3/uL (ref 0.7–4.0)
MCH: 27.4 pg (ref 26.0–34.0)
MCHC: 30.5 g/dL (ref 30.0–36.0)
MCV: 89.7 fL (ref 80.0–100.0)
Monocytes Absolute: 0.4 10*3/uL (ref 0.1–1.0)
Monocytes Relative: 2 %
Neutro Abs: 12.4 10*3/uL — ABNORMAL HIGH (ref 1.7–7.7)
Neutrophils Relative %: 80 %
Platelets: 156 10*3/uL (ref 150–400)
RBC: 3.98 MIL/uL (ref 3.87–5.11)
RDW: 26.4 % — ABNORMAL HIGH (ref 11.5–15.5)
WBC: 15.6 10*3/uL — ABNORMAL HIGH (ref 4.0–10.5)
nRBC: 0 % (ref 0.0–0.2)

## 2022-01-04 LAB — GLUCOSE, CAPILLARY
Glucose-Capillary: 396 mg/dL — ABNORMAL HIGH (ref 70–99)
Glucose-Capillary: 431 mg/dL — ABNORMAL HIGH (ref 70–99)

## 2022-01-04 LAB — URINALYSIS, ROUTINE W REFLEX MICROSCOPIC
Bacteria, UA: NONE SEEN
Bilirubin Urine: NEGATIVE
Glucose, UA: NEGATIVE mg/dL
Ketones, ur: 5 mg/dL — AB
Leukocytes,Ua: NEGATIVE
Nitrite: NEGATIVE
Protein, ur: 30 mg/dL — AB
Specific Gravity, Urine: 1.021 (ref 1.005–1.030)
pH: 5 (ref 5.0–8.0)

## 2022-01-04 LAB — COMPREHENSIVE METABOLIC PANEL
ALT: 81 U/L — ABNORMAL HIGH (ref 0–44)
AST: 32 U/L (ref 15–41)
Albumin: 3.7 g/dL (ref 3.5–5.0)
Alkaline Phosphatase: 59 U/L (ref 38–126)
Anion gap: 10 (ref 5–15)
BUN: 28 mg/dL — ABNORMAL HIGH (ref 8–23)
CO2: 27 mmol/L (ref 22–32)
Calcium: 11.7 mg/dL — ABNORMAL HIGH (ref 8.9–10.3)
Chloride: 102 mmol/L (ref 98–111)
Creatinine, Ser: 0.69 mg/dL (ref 0.44–1.00)
GFR, Estimated: >60 mL/min (ref >60)
Glucose, Bld: 52 mg/dL — ABNORMAL LOW (ref 70–99)
Potassium: 4 mmol/L (ref 3.5–5.1)
Sodium: 139 mmol/L (ref 135–145)
Total Bilirubin: 0.4 mg/dL (ref 0.3–1.2)
Total Protein: 7.3 g/dL (ref 6.5–8.1)

## 2022-01-04 LAB — BLOOD GAS, VENOUS
Acid-Base Excess: 5.2 mmol/L — ABNORMAL HIGH (ref 0.0–2.0)
Bicarbonate: 31.5 mmol/L — ABNORMAL HIGH (ref 20.0–28.0)
Drawn by: 2160
O2 Saturation: 26.7 %
Patient temperature: 36.5
pCO2, Ven: 51 mmHg (ref 44–60)
pH, Ven: 7.4 (ref 7.25–7.43)
pO2, Ven: <31 mmHg — CL (ref 32–45)

## 2022-01-04 LAB — CBG MONITORING, ED: Glucose-Capillary: 41 mg/dL — CL (ref 70–99)

## 2022-01-04 LAB — LACTIC ACID, PLASMA
Lactic Acid, Venous: 2.5 mmol/L (ref 0.5–1.9)
Lactic Acid, Venous: 1.7 mmol/L (ref 0.5–1.9)

## 2022-01-04 LAB — MRSA NEXT GEN BY PCR, NASAL: MRSA by PCR Next Gen: NOT DETECTED

## 2022-01-04 LAB — RESP PANEL BY RT-PCR (FLU A&B, COVID) ARPGX2
Influenza A by PCR: NEGATIVE
Influenza B by PCR: NEGATIVE
SARS Coronavirus 2 by RT PCR: POSITIVE — AB

## 2022-01-04 LAB — TROPONIN I (HIGH SENSITIVITY)
Troponin I (High Sensitivity): 9 ng/L (ref <18)
Troponin I (High Sensitivity): 8 ng/L (ref <18)

## 2022-01-04 LAB — MAGNESIUM: Magnesium: 1.5 mg/dL — ABNORMAL LOW (ref 1.7–2.4)

## 2022-01-04 LAB — PHOSPHORUS: Phosphorus: 3 mg/dL (ref 2.5–4.6)

## 2022-01-04 LAB — BRAIN NATRIURETIC PEPTIDE: B Natriuretic Peptide: 74 pg/mL (ref 0.0–100.0)

## 2022-01-04 MED ORDER — MONTELUKAST SODIUM 10 MG PO TABS
10.0000 mg | ORAL_TABLET | Freq: Every day | ORAL | Status: DC
  Administered 2022-01-04 – 2022-01-11 (×8): 10 mg via ORAL
  Filled 2022-01-04 (×8): qty 1

## 2022-01-04 MED ORDER — ONDANSETRON HCL 4 MG PO TABS: 4.0000 mg | ORAL_TABLET | Freq: Four times a day (QID) | ORAL | Status: DC | PRN

## 2022-01-04 MED ORDER — BUDESONIDE 0.5 MG/2ML IN SUSP
0.5000 mg | Freq: Two times a day (BID) | RESPIRATORY_TRACT | Status: DC
  Administered 2022-01-05 – 2022-01-12 (×15): 0.5 mg via RESPIRATORY_TRACT
  Filled 2022-01-04 (×14): qty 2

## 2022-01-04 MED ORDER — ALBUTEROL SULFATE (2.5 MG/3ML) 0.083% IN NEBU
3.0000 mL | INHALATION_SOLUTION | RESPIRATORY_TRACT | Status: DC | PRN
  Administered 2022-01-04 – 2022-01-08 (×2): 3 mL via RESPIRATORY_TRACT
  Filled 2022-01-04 (×2): qty 3

## 2022-01-04 MED ORDER — METHYLPREDNISOLONE SODIUM SUCC 40 MG IJ SOLR
40.0000 mg | Freq: Two times a day (BID) | INTRAMUSCULAR | Status: DC
  Administered 2022-01-04 – 2022-01-12 (×17): 40 mg via INTRAVENOUS
  Filled 2022-01-04 (×18): qty 1

## 2022-01-04 MED ORDER — IPRATROPIUM-ALBUTEROL 0.5-2.5 (3) MG/3ML IN SOLN: 3.0000 mL | Freq: Four times a day (QID) | RESPIRATORY_TRACT | Status: DC

## 2022-01-04 MED ORDER — ARFORMOTEROL TARTRATE 15 MCG/2ML IN NEBU
15.0000 ug | INHALATION_SOLUTION | Freq: Two times a day (BID) | RESPIRATORY_TRACT | Status: DC
  Administered 2022-01-04: 15 ug via RESPIRATORY_TRACT
  Filled 2022-01-04: qty 2

## 2022-01-04 MED ORDER — DOCUSATE SODIUM 100 MG PO CAPS
100.0000 mg | ORAL_CAPSULE | Freq: Every day | ORAL | Status: DC
  Administered 2022-01-04 – 2022-01-12 (×9): 100 mg via ORAL
  Filled 2022-01-04 (×9): qty 1

## 2022-01-04 MED ORDER — POLYVINYL ALCOHOL 1.4 % OP SOLN
1.0000 [drp] | Freq: Four times a day (QID) | OPHTHALMIC | Status: DC
  Administered 2022-01-04 – 2022-01-12 (×30): 1 [drp] via OPHTHALMIC
  Filled 2022-01-04 (×2): qty 15

## 2022-01-04 MED ORDER — ATORVASTATIN CALCIUM 40 MG PO TABS
40.0000 mg | ORAL_TABLET | Freq: Every day | ORAL | Status: DC
  Administered 2022-01-04 – 2022-01-12 (×9): 40 mg via ORAL
  Filled 2022-01-04 (×9): qty 1

## 2022-01-04 MED ORDER — REVEFENACIN 175 MCG/3ML IN SOLN
175.0000 ug | Freq: Every day | RESPIRATORY_TRACT | Status: DC
  Administered 2022-01-04 – 2022-01-12 (×9): 175 ug via RESPIRATORY_TRACT
  Filled 2022-01-04 (×13): qty 3

## 2022-01-04 MED ORDER — POLYETHYLENE GLYCOL 3350 17 G PO PACK: 17.0000 g | PACK | Freq: Every day | ORAL | Status: DC | PRN

## 2022-01-04 MED ORDER — ONDANSETRON HCL 4 MG/2ML IJ SOLN: 4.0000 mg | Freq: Four times a day (QID) | INTRAMUSCULAR | Status: DC | PRN

## 2022-01-04 MED ORDER — ACETAMINOPHEN 650 MG RE SUPP: 650.0000 mg | Freq: Four times a day (QID) | RECTAL | Status: DC | PRN

## 2022-01-04 MED ORDER — VANCOMYCIN HCL IN DEXTROSE 1-5 GM/200ML-% IV SOLN
1000.0000 mg | INTRAVENOUS | Status: DC
  Administered 2022-01-05: 1000 mg via INTRAVENOUS
  Filled 2022-01-04: qty 200

## 2022-01-04 MED ORDER — FLUTICASONE PROPIONATE 50 MCG/ACT NA SUSP
1.0000 | Freq: Every day | NASAL | Status: DC
  Administered 2022-01-05 – 2022-01-12 (×8): 1 via NASAL
  Filled 2022-01-04 (×3): qty 16

## 2022-01-04 MED ORDER — APIXABAN 5 MG PO TABS
5.0000 mg | ORAL_TABLET | Freq: Two times a day (BID) | ORAL | Status: DC
  Administered 2022-01-04 – 2022-01-12 (×16): 5 mg via ORAL
  Filled 2022-01-04 (×16): qty 1

## 2022-01-04 MED ORDER — CHLORHEXIDINE GLUCONATE CLOTH 2 % EX PADS
6.0000 | MEDICATED_PAD | Freq: Every day | CUTANEOUS | Status: DC
  Administered 2022-01-05 – 2022-01-09 (×5): 6 via TOPICAL

## 2022-01-04 MED ORDER — LACTATED RINGERS IV BOLUS
500.0000 mL | Freq: Once | INTRAVENOUS | Status: AC
  Administered 2022-01-04: 500 mL via INTRAVENOUS

## 2022-01-04 MED ORDER — DEXTROSE 50 % IV SOLN
INTRAVENOUS | Status: AC
  Administered 2022-01-04: 50 mL
  Filled 2022-01-04: qty 50

## 2022-01-04 MED ORDER — HYDROCHLOROTHIAZIDE 12.5 MG PO TABS
12.5000 mg | ORAL_TABLET | Freq: Every day | ORAL | Status: DC
  Administered 2022-01-04 – 2022-01-12 (×9): 12.5 mg via ORAL
  Filled 2022-01-04 (×9): qty 1

## 2022-01-04 MED ORDER — BUDESONIDE 0.5 MG/2ML IN SUSP
0.5000 mg | Freq: Two times a day (BID) | RESPIRATORY_TRACT | Status: DC
  Administered 2022-01-04: 0.5 mg via RESPIRATORY_TRACT
  Filled 2022-01-04: qty 2

## 2022-01-04 MED ORDER — LACTATED RINGERS IV BOLUS
1200.0000 mL | Freq: Once | INTRAVENOUS | Status: AC
  Administered 2022-01-04: 1200 mL via INTRAVENOUS

## 2022-01-04 MED ORDER — ASPIRIN 81 MG PO TBEC
81.0000 mg | DELAYED_RELEASE_TABLET | Freq: Every day | ORAL | Status: DC
  Administered 2022-01-05 – 2022-01-12 (×8): 81 mg via ORAL
  Filled 2022-01-04 (×8): qty 1

## 2022-01-04 MED ORDER — VANCOMYCIN HCL 1750 MG/350ML IV SOLN
1750.0000 mg | Freq: Once | INTRAVENOUS | Status: AC
  Administered 2022-01-04: 1750 mg via INTRAVENOUS
  Filled 2022-01-04: qty 350

## 2022-01-04 MED ORDER — ARFORMOTEROL TARTRATE 15 MCG/2ML IN NEBU
15.0000 ug | INHALATION_SOLUTION | Freq: Two times a day (BID) | RESPIRATORY_TRACT | Status: DC
  Administered 2022-01-05 – 2022-01-12 (×15): 15 ug via RESPIRATORY_TRACT
  Filled 2022-01-04 (×15): qty 2

## 2022-01-04 MED ORDER — LOSARTAN POTASSIUM 50 MG PO TABS
100.0000 mg | ORAL_TABLET | Freq: Every day | ORAL | Status: DC
  Administered 2022-01-04 – 2022-01-12 (×9): 100 mg via ORAL
  Filled 2022-01-04 (×9): qty 2

## 2022-01-04 MED ORDER — GABAPENTIN 100 MG PO CAPS
100.0000 mg | ORAL_CAPSULE | Freq: Three times a day (TID) | ORAL | Status: DC
  Administered 2022-01-04 – 2022-01-12 (×24): 100 mg via ORAL
  Filled 2022-01-04 (×24): qty 1

## 2022-01-04 MED ORDER — INSULIN ASPART 100 UNIT/ML IJ SOLN
0.0000 [IU] | Freq: Every day | INTRAMUSCULAR | Status: DC
  Administered 2022-01-04: 5 [IU] via SUBCUTANEOUS
  Administered 2022-01-05: 2 [IU] via SUBCUTANEOUS
  Administered 2022-01-07: 5 [IU] via SUBCUTANEOUS
  Administered 2022-01-08: 4 [IU] via SUBCUTANEOUS
  Administered 2022-01-09: 2 [IU] via SUBCUTANEOUS
  Administered 2022-01-10: 4 [IU] via SUBCUTANEOUS
  Administered 2022-01-11: 5 [IU] via SUBCUTANEOUS

## 2022-01-04 MED ORDER — IPRATROPIUM BROMIDE 0.02 % IN SOLN
0.5000 mg | Freq: Once | RESPIRATORY_TRACT | Status: AC
  Administered 2022-01-04: 0.5 mg via RESPIRATORY_TRACT

## 2022-01-04 MED ORDER — PANTOPRAZOLE SODIUM 40 MG PO TBEC
40.0000 mg | DELAYED_RELEASE_TABLET | Freq: Every day | ORAL | Status: DC
  Administered 2022-01-04: 40 mg via ORAL
  Filled 2022-01-04: qty 1

## 2022-01-04 MED ORDER — FERROUS SULFATE 325 (65 FE) MG PO TABS
325.0000 mg | ORAL_TABLET | Freq: Every day | ORAL | Status: DC
  Administered 2022-01-04 – 2022-01-12 (×9): 325 mg via ORAL
  Filled 2022-01-04 (×9): qty 1

## 2022-01-04 MED ORDER — FLUCONAZOLE 100 MG PO TABS
200.0000 mg | ORAL_TABLET | Freq: Every day | ORAL | Status: DC
  Administered 2022-01-04 – 2022-01-12 (×9): 200 mg via ORAL
  Filled 2022-01-04 (×9): qty 2

## 2022-01-04 MED ORDER — ACETAMINOPHEN 325 MG PO TABS
650.0000 mg | ORAL_TABLET | Freq: Four times a day (QID) | ORAL | Status: DC | PRN
  Administered 2022-01-11: 650 mg via ORAL
  Filled 2022-01-04: qty 2

## 2022-01-04 MED ORDER — INSULIN GLARGINE-YFGN 100 UNIT/ML ~~LOC~~ SOLN
12.0000 [IU] | Freq: Every day | SUBCUTANEOUS | Status: DC
  Filled 2022-01-04 (×2): qty 0.12

## 2022-01-04 MED ORDER — IPRATROPIUM BROMIDE 0.02 % IN SOLN
RESPIRATORY_TRACT | Status: AC
  Filled 2022-01-04: qty 2.5

## 2022-01-04 MED ORDER — DEXTROSE-NACL 5-0.45 % IV SOLN: INTRAVENOUS | Status: AC

## 2022-01-04 MED ORDER — IOHEXOL 350 MG/ML SOLN
75.0000 mL | Freq: Once | INTRAVENOUS | Status: AC | PRN
  Administered 2022-01-04: 75 mL via INTRAVENOUS

## 2022-01-04 MED ORDER — SODIUM CHLORIDE 0.9 % IV SOLN
2.0000 g | Freq: Two times a day (BID) | INTRAVENOUS | Status: DC
  Administered 2022-01-04 – 2022-01-05 (×2): 2 g via INTRAVENOUS
  Filled 2022-01-04 (×2): qty 12.5

## 2022-01-04 MED ORDER — PANTOPRAZOLE SODIUM 40 MG PO TBEC
40.0000 mg | DELAYED_RELEASE_TABLET | Freq: Two times a day (BID) | ORAL | Status: DC
  Administered 2022-01-04 – 2022-01-12 (×16): 40 mg via ORAL
  Filled 2022-01-04 (×16): qty 1

## 2022-01-04 MED ORDER — ALBUTEROL SULFATE (2.5 MG/3ML) 0.083% IN NEBU: 2.5000 mg | INHALATION_SOLUTION | RESPIRATORY_TRACT | Status: DC

## 2022-01-04 MED ORDER — SODIUM CHLORIDE 0.9 % IV SOLN
2.0000 g | Freq: Once | INTRAVENOUS | Status: AC
  Administered 2022-01-04: 2 g via INTRAVENOUS
  Filled 2022-01-04: qty 12.5

## 2022-01-04 MED ORDER — INSULIN ASPART 100 UNIT/ML IJ SOLN
0.0000 [IU] | Freq: Three times a day (TID) | INTRAMUSCULAR | Status: DC
  Administered 2022-01-05: 7 [IU] via SUBCUTANEOUS
  Administered 2022-01-05: 5 [IU] via SUBCUTANEOUS
  Administered 2022-01-05: 2 [IU] via SUBCUTANEOUS
  Administered 2022-01-06 (×2): 5 [IU] via SUBCUTANEOUS
  Administered 2022-01-06: 3 [IU] via SUBCUTANEOUS
  Administered 2022-01-07 (×3): 5 [IU] via SUBCUTANEOUS
  Administered 2022-01-08 (×2): 3 [IU] via SUBCUTANEOUS
  Administered 2022-01-08 – 2022-01-09 (×2): 2 [IU] via SUBCUTANEOUS
  Administered 2022-01-09 (×2): 3 [IU] via SUBCUTANEOUS
  Administered 2022-01-10: 7 [IU] via SUBCUTANEOUS
  Administered 2022-01-10: 5 [IU] via SUBCUTANEOUS
  Administered 2022-01-10: 1 [IU] via SUBCUTANEOUS
  Administered 2022-01-11: 5 [IU] via SUBCUTANEOUS
  Administered 2022-01-11: 3 [IU] via SUBCUTANEOUS
  Administered 2022-01-11: 2 [IU] via SUBCUTANEOUS
  Administered 2022-01-12: 9 [IU] via SUBCUTANEOUS
  Administered 2022-01-12: 3 [IU] via SUBCUTANEOUS
  Administered 2022-01-12: 5 [IU] via SUBCUTANEOUS

## 2022-01-04 MED ORDER — LOSARTAN POTASSIUM-HCTZ 100-12.5 MG PO TABS: 1.0000 | ORAL_TABLET | Freq: Every day | ORAL | Status: DC

## 2022-01-04 MED ORDER — DM-GUAIFENESIN ER 30-600 MG PO TB12
1.0000 | ORAL_TABLET | Freq: Two times a day (BID) | ORAL | Status: DC
  Administered 2022-01-04 – 2022-01-12 (×16): 1 via ORAL
  Filled 2022-01-04 (×16): qty 1

## 2022-01-04 MED ORDER — ALBUTEROL SULFATE (2.5 MG/3ML) 0.083% IN NEBU
2.5000 mg | INHALATION_SOLUTION | Freq: Four times a day (QID) | RESPIRATORY_TRACT | Status: DC
  Administered 2022-01-04 – 2022-01-07 (×12): 2.5 mg via RESPIRATORY_TRACT
  Filled 2022-01-04 (×12): qty 3

## 2022-01-04 MED ORDER — ORAL CARE MOUTH RINSE: 15.0000 mL | OROMUCOSAL | Status: DC | PRN

## 2022-01-04 MED ORDER — METOPROLOL TARTRATE 50 MG PO TABS
50.0000 mg | ORAL_TABLET | Freq: Two times a day (BID) | ORAL | Status: DC
  Administered 2022-01-04 – 2022-01-12 (×16): 50 mg via ORAL
  Filled 2022-01-04 (×16): qty 1

## 2022-01-04 MED ORDER — GUAIFENESIN ER 600 MG PO TB12: 1200.0000 mg | ORAL_TABLET | Freq: Two times a day (BID) | ORAL | Status: DC | PRN

## 2022-01-04 MED ORDER — MAGNESIUM SULFATE 2 GM/50ML IV SOLN
2.0000 g | Freq: Once | INTRAVENOUS | Status: AC
  Administered 2022-01-04: 2 g via INTRAVENOUS
  Filled 2022-01-04: qty 50

## 2022-01-04 NOTE — Assessment & Plan Note (Signed)
-  Mild hypoglycemic event at time of admission in the setting of poor oral intake. -Patient will be receiving steroids which will help with low sugars prevention. -continue SSI and levemir -Recent A1c 6.3 -Follow CBGs fluctuation.

## 2022-01-04 NOTE — Assessment & Plan Note (Addendum)
-  No vascular congestion appreciated on x-ray or CT scan -Continue to follow daily weights and strict I's and O's -Continue home medication regimen. -Maintain adequate hydration. -Patient chronically on HCTZ. -Continue to follow urine output.

## 2022-01-04 NOTE — ED Notes (Signed)
Date and time results received: 01/04/22 0925   Test: vbg Critical Value: Po2 <31  Name of Provider Notified: Will Scheving  No new orders received. Will continue to monitor.

## 2022-01-04 NOTE — Sepsis Progress Note (Signed)
Sepsis protocol monitored by [facility] 

## 2022-01-04 NOTE — Assessment & Plan Note (Signed)
-  Present at time of admission -No signs of superimposed infection -Provide preventive measures and constant repositioning.

## 2022-01-04 NOTE — ED Provider Notes (Signed)
[facility] Provider Note  CSN: [id number] Arrival date & time: 01/04/22 2992  Chief Complaint(s) Respiratory Distress  HPI Michaela Moon is a 86 y.o. female with history of COPD, diabetes, hyperlipidemia, hypertension, multiple recent hospitalizations for hypoxic respiratory failure presenting with shortness of breath.  Patient reports that she was doing well until this morning when she developed significant worsening in her shortness of breath around 7 AM.  She reports coughing up orange phlegm.  Symptoms are worse with exertion.  She denies any fevers or chills.  Reports mildly worsening bilateral lower extremity swelling.  No chest pain, headaches, abdominal pain, nausea, vomiting.  Symptoms are severe  Past Medical History Past Medical History:  Diagnosis Date   Arthritis    Asthma    COPD (chronic obstructive pulmonary disease) ([place])    Diabetes mellitus without complication (HCC)    GERD (gastroesophageal reflux disease)    Headache    Hyperlipidemia    Hypertension    Macular degeneration    Neuropathy    Obesity    Osteopenia    Stress incontinence    Patient Active Problem List   Diagnosis Date Noted   Acute on chronic respiratory failure with hypoxia ([place]) 12/25/2021   Hypoglycemia 12/25/2021   Insulin dependent type 2 diabetes mellitus ([place]) 12/25/2021   Hypothermia 42/68/3419   Acute diastolic CHF (congestive heart failure) ([place]) 12/16/2021   Lactic acidosis 12/07/2021   Iron deficiency anemia 12/07/2021   Hypoalbuminemia due to protein-calorie malnutrition ([place]) 12/07/2021   Elevated brain natriuretic peptide (BNP) level 12/07/2021   Pressure injury of skin 11/29/2021   Acute respiratory failure with hypoxemia (HCC)    Recurrent pneumonia    Multifocal pneumonia 11/28/2021   Acute respiratory failure with hypoxia ([place]) 11/28/2021   Lung nodule 11/28/2021   AKI (acute kidney injury) ([place]) 11/28/2021   Carpal tunnel syndrome of right  wrist    S/P carpal tunnel release left 10/23/17 right 11/20/17    Gastritis and gastroduodenitis 08/16/2016   Erosive gastritis    GERD (gastroesophageal reflux disease) 04/15/2016   Dysphagia 04/15/2016   Acute exacerbation of chronic obstructive pulmonary disease (COPD) ([place])    Pneumonia 01/29/2013   Hypercalcemia 12/24/2012   Pain in joint, ankle and foot 11/27/2012   Peripheral neuropathy 11/27/2012   Essential hypertension    Mixed hyperlipidemia    Diabetes mellitus without complication ([place])    Arthritis    Obesity    Osteopenia    Home Medication(s) Prior to Admission medications   Medication Sig Start Date End Date Taking? Authorizing Provider  albuterol (PROVENTIL) (2.5 MG/3ML) 0.083% nebulizer solution Take 3 mLs (2.5 mg total) by nebulization every 6 (six) hours as needed for wheezing or shortness of breath. 02/14/21  Yes Chesley Mires, MD  albuterol-ipratropium (COMBIVENT) 18-103 MCG/ACT inhaler Inhale 2 puffs into the lungs every 6 (six) hours as needed for wheezing. 11/27/12  Yes Vernie Shanks, MD  amoxicillin-clavulanate (AUGMENTIN) 875-125 MG tablet Take 1 tablet by mouth 2 (two) times daily for 5 days. 01/01/22 01/06/22 Yes Emokpae, Courage, MD  apixaban (ELIQUIS) 5 MG TABS tablet Take 1 tablet (5 mg total) by mouth 2 (two) times daily. 12/20/21  Yes Johnson, Clanford L, MD  aspirin 81 MG tablet Take 1 tablet (81 mg total) by mouth daily with breakfast. 01/01/22  Yes Emokpae, Courage, MD  atorvastatin (LIPITOR) 80 MG tablet ALTERNATE TAKING 1 TABLET EVERY OTHER DAY WITH 1/2 TABLET EVERY OTHERDAY 01/13/14  Yes Redge Gainer  W, MD  Biotin w/ Vitamins C & E (HAIR/SKIN/NAILS PO) Take 1 tablet by mouth daily. Takes once a day.   Yes [provider]  Budeson-Glycopyrrol-Formoterol (BREZTRI AEROSPHERE) 160-9-4.8 MCG/ACT AERO Inhale 2 puffs into the lungs in the morning and at bedtime. 03/08/21  Yes Chesley Mires, MD  docusate sodium (COLACE) 100 MG capsule Take 1 capsule  (100 mg total) by mouth daily. 12/04/21 12/04/22 Yes Shah, Pratik D, DO  ferrous sulfate 325 (65 FE) MG tablet Take 1 tablet (325 mg total) by mouth daily. 12/04/21 12/04/22 Yes Shah, Pratik D, DO  fluconazole (DIFLUCAN) 200 MG tablet Take 1 tablet (200 mg total) by mouth daily. 01/02/22  Yes Emokpae, Courage, MD  fluticasone (FLONASE) 50 MCG/ACT nasal spray Place 1 spray into both nostrils daily. 10/19/20  Yes Chesley Mires, MD  gabapentin (NEURONTIN) 100 MG capsule Take 1 capsule (100 mg total) by mouth 3 (three) times daily. 12/20/21  Yes Johnson, Clanford L, MD  glimepiride (AMARYL) 2 MG tablet Take 4 mg by mouth daily with breakfast.    Yes [provider]  guaiFENesin (MUCINEX) 600 MG 12 hr tablet Take 2 tablets (1,200 mg total) by mouth 2 (two) times daily as needed for cough or to loosen phlegm. 01/01/22  Yes Emokpae, Courage, MD  Hypromellose (ARTIFICIAL TEARS OP) Place 1 drop into both eyes 4 (four) times daily.   Yes [provider]  LANTUS SOLOSTAR 100 UNIT/ML Solostar Pen Inject 20 Units into the skin in the morning and at bedtime. 11/22/20  Yes [provider]  losartan-hydrochlorothiazide (HYZAAR) 100-12.5 MG per tablet TAKE ONE (1) TABLET EACH DAY Patient taking differently: Take 1 tablet by mouth daily. 03/09/14  Yes Chipper Herb, MD  metoprolol (LOPRESSOR) 50 MG tablet Take 50 mg by mouth 2 (two) times daily.   Yes [provider]  montelukast (SINGULAIR) 10 MG tablet TAKE ONE TABLET BY MOUTH AT BEDTIME 07/30/21  Yes Chesley Mires, MD  Multiple Vitamins-Minerals (CENTRUM SILVER PO) Take 1 tablet by mouth daily.   Yes [provider]  Omega-3 Fatty Acids (FISH OIL) 1000 MG CAPS Take 1,000 mg by mouth in the morning, at noon, and at bedtime.   Yes [provider]  ondansetron (ZOFRAN) 4 MG tablet Take 1 tablet (4 mg total) by mouth every 6 (six) hours as needed for nausea. 01/01/22  Yes Emokpae, Courage, MD  oxybutynin (DITROPAN) 5 MG  tablet Take 5 mg by mouth every 8 (eight) hours as needed for bladder spasms.    Yes [provider]  pantoprazole (PROTONIX) 40 MG tablet Take 1 tablet (40 mg total) by mouth daily. 01/01/22  Yes Emokpae, Courage, MD  polyethylene glycol (MIRALAX / GLYCOLAX) 17 g packet Take 17 g by mouth daily as needed for mild constipation. 12/20/21  Yes Johnson, Clanford L, MD  predniSONE (DELTASONE) 10 MG tablet Take 1 tablet (10 mg total) by mouth daily with breakfast. Take 40 mg (4 tab) daily for 3 days , then 30 mg (3 Tab) daily for 3 days, then Take 20 mg (2 Tab) daily for 3 days, then Take 10 mg (10 mg) daily for 3 days... Then STOP 01/01/22  Yes Emokpae, Courage, MD  sitaGLIPtin-metformin (JANUMET) 50-1000 MG tablet Take 1 tablet by mouth 2 (two) times daily with a meal.   Yes [provider]  triamcinolone cream (KENALOG) 0.1 % Apply 1 application topically daily as needed (eczema).   Yes [provider]  Past Surgical History Past Surgical History:  Procedure Laterality Date   BREAST LUMPECTOMY Bilateral    no cancer   CARPAL TUNNEL RELEASE Left 10/23/2017   Procedure: LEFT CARPAL TUNNEL RELEASE;  Surgeon: Carole Civil, MD;  Location: [facility];  Service: Orthopedics;  Laterality: Left;   CARPAL TUNNEL RELEASE Right 11/20/2017   Procedure: CARPAL TUNNEL RELEASE;  Surgeon: Carole Civil, MD;  Location: [facility];  Service: Orthopedics;  Laterality: Right;   CATARACT EXTRACTION W/PHACO Left 11/01/2016   Procedure: CATARACT EXTRACTION PHACO AND INTRAOCULAR LENS PLACEMENT (IOC);  Surgeon: Baruch Goldmann, MD;  Location: [facility];  Service: Ophthalmology;  Laterality: Left;  CDE: 3.83   CATARACT EXTRACTION W/PHACO Right 11/29/2016   Procedure: CATARACT EXTRACTION PHACO AND INTRAOCULAR LENS PLACEMENT RIGHT EYE;  Surgeon: Baruch Goldmann, MD;  Location: [facility];  Service: Ophthalmology;  Laterality: Right;  CDE: 8.71   colon tumor removed  2010   Dr. 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 submucosal lipoma on path   COLONOSCOPY  05/2013   Dr. Anthony Sar: normal. (h/o colon polyps)   COLONOSCOPY  2011   normal   COLONOSCOPY  10/2003   Dr. Lindalou Hose: large polypoid mass hepatic fluexure   ESOPHAGOGASTRODUODENOSCOPY N/A 04/29/2016   Procedure: ESOPHAGOGASTRODUODENOSCOPY (EGD);  Surgeon: Danie Binder, MD;  Location: [facility];  Service: Endoscopy;  Laterality: N/A;  9:15am   SAVORY DILATION N/A 04/29/2016   Procedure: SAVORY DILATION;  Surgeon: Danie Binder, MD;  Location: [facility];  Service: Endoscopy;  Laterality: N/A;   Family History Family History  Problem Relation Age of Onset   Melanoma Brother    Colon cancer Neg Hx     Social History Social History   Tobacco Use   Smoking status: Former    Packs/day: 2.00    Years: 20.00    Total pack years: 40.00    Types: Cigarettes    Quit date: 02/25/2002    Years since quitting: 19.8   Smokeless tobacco: Never  Vaping Use   Vaping Use: Never used  Substance Use Topics   Alcohol use: No   Drug use: No   Allergies Onion  Review of Systems Review of Systems  All other systems reviewed and are negative.   Physical Exam Vital Signs  I have reviewed the triage vital signs BP (!) 151/91   Pulse 67   Temp 97.7 F (36.5 C) (Oral)   Resp 18   Ht '5\' 5"'$  (1.651 m)   Wt 79.4 kg   SpO2 95%   BMI 29.12 kg/m  Physical Exam Vitals and nursing note reviewed.  Constitutional:      General: She is in acute distress.     Appearance: She is well-developed.  HENT:     Head: Normocephalic and atraumatic.     Mouth/Throat:     Mouth: Mucous membranes are moist.  Eyes:     Pupils: Pupils are equal, round, and reactive to light.  Cardiovascular:     Rate and Rhythm: Normal rate and regular rhythm.     Heart sounds: No murmur  heard. Pulmonary:     Comments: Diffuse rhonchorous breath sounds throughout all lung fields, accessory muscle use present, moderate respiratory distress, not speaking in full sentences Abdominal:     General: Abdomen is flat.     Palpations: Abdomen is soft.     Tenderness: There is no abdominal tenderness.  Musculoskeletal:  General: No tenderness.     Right lower leg: Edema present.     Left lower leg: Edema present.  Skin:    General: Skin is warm and dry.  Neurological:     General: No focal deficit present.     Mental Status: She is alert. Mental status is at baseline.  Psychiatric:        Mood and Affect: Mood normal.        Behavior: Behavior normal.     ED Results and Treatments Labs (all labs ordered are listed, but only abnormal results are displayed) Labs Reviewed  CBC WITH DIFFERENTIAL/PLATELET - Abnormal; Notable for the following components:      Result Value   WBC 15.6 (*)    Hemoglobin 10.9 (*)    HCT 35.7 (*)    RDW 26.4 (*)    Neutro Abs 12.4 (*)    Abs Immature Granulocytes 0.11 (*)    All other components within normal limits  COMPREHENSIVE METABOLIC PANEL - Abnormal; Notable for the following components:   Glucose, Bld 52 (*)    BUN 28 (*)    Calcium 11.7 (*)    ALT 81 (*)    All other components within normal limits  BLOOD GAS, VENOUS - Abnormal; Notable for the following components:   pO2, Ven <31 (*)    Bicarbonate 31.5 (*)    Acid-Base Excess 5.2 (*)    All other components within normal limits  LACTIC ACID, PLASMA - Abnormal; Notable for the following components:   Lactic Acid, Venous 2.5 (*)    All other components within normal limits  URINALYSIS, ROUTINE W REFLEX MICROSCOPIC - Abnormal; Notable for the following components:   APPearance HAZY (*)    Hgb urine dipstick SMALL (*)    Ketones, ur 5 (*)    Protein, ur 30 (*)    Non Squamous Epithelial 0-5 (*)    All other components within normal limits  CBG MONITORING, ED -  Abnormal; Notable for the following components:   Glucose-Capillary 41 (*)    All other components within normal limits  CULTURE, BLOOD (ROUTINE X 2)  CULTURE, BLOOD (ROUTINE X 2)  RESP PANEL BY RT-PCR (FLU A&B, COVID) ARPGX2  MRSA NEXT GEN BY PCR, NASAL  EXPECTORATED SPUTUM ASSESSMENT W GRAM STAIN, RFLX TO RESP C  LACTIC ACID, PLASMA  BRAIN NATRIURETIC PEPTIDE  MAGNESIUM  PHOSPHORUS  TROPONIN I (HIGH SENSITIVITY)  TROPONIN I (HIGH SENSITIVITY)                                                                                                                          Radiology CT Angio Chest PE W/Cm &/Or Wo Cm  Result Date: 01/04/2022 CLINICAL DATA:  PE suspected shortness of breath EXAM: CT ANGIOGRAPHY CHEST WITH CONTRAST TECHNIQUE: Multidetector CT imaging of the chest was performed using the standard protocol during bolus administration of intravenous contrast. Multiplanar CT image reconstructions and MIPs were obtained to evaluate the  vascular anatomy. RADIATION DOSE REDUCTION: This exam was performed according to the departmental dose-optimization program which includes automated exposure control, adjustment of the mA and/or kV according to patient size and/or use of iterative reconstruction technique. CONTRAST:  62m OMNIPAQUE IOHEXOL 350 MG/ML SOLN COMPARISON:  12/25/2021 FINDINGS: Cardiovascular: Satisfactory opacification of the pulmonary arteries to the segmental level. No evidence of pulmonary embolism. Normal heart size. Left and right coronary artery calcifications. No pericardial effusion. Aortic atherosclerosis. Mediastinum/Nodes: No enlarged mediastinal, hilar, or axillary lymph nodes. Thyroid gland, trachea, and esophagus demonstrate no significant findings. Lungs/Pleura: Extensive bilateral heterogeneous and consolidative airspace opacity is not significantly changed compared to prior examination. No pleural effusion or pneumothorax. Upper Abdomen: No acute abnormality.  Musculoskeletal: No chest wall abnormality. No acute osseous findings. Review of the MIP images confirms the above findings. IMPRESSION: 1. Negative examination for pulmonary embolism. 2. Extensive bilateral heterogeneous and consolidative airspace opacity is not significantly changed compared to prior examination. Findings are consistent with multifocal infection and/or aspiration. 3. Coronary artery disease. Aortic Atherosclerosis (ICD10-I70.0). Electronically Signed   By: ADelanna AhmadiM.D.   On: 01/04/2022 11:27   DG Chest Port 1 View  Result Date: 01/04/2022 CLINICAL DATA:  Shortness of breath. EXAM: PORTABLE CHEST 1 VIEW COMPARISON:  December 31, 2021. FINDINGS: The heart size and mediastinal contours are within normal limits. Stable patchy airspace opacities are noted bilaterally concerning for multifocal pneumonia. The visualized skeletal structures are unremarkable. IMPRESSION: Stable bilateral lung opacities as described above. Aortic Atherosclerosis (ICD10-I70.0). Electronically Signed   By: JMarijo ConceptionM.D.   On: 01/04/2022 09:30    Pertinent labs & imaging results that were available during my care of the patient were reviewed by me and considered in my medical decision making (see MDM for details).  Medications Ordered in ED Medications  albuterol (PROVENTIL) (2.5 MG/3ML) 0.083% nebulizer solution 3 mL (3 mLs Inhalation Given 01/04/22 0925)  vancomycin (VANCOREADY) IVPB 1750 mg/350 mL (1,750 mg Intravenous New Bag/Given 01/04/22 0949)    Followed by  vancomycin (VANCOCIN) IVPB 1000 mg/200 mL premix (has no administration in time range)  methylPREDNISolone sodium succinate (SOLU-MEDROL) 40 mg/mL injection 40 mg (40 mg Intravenous Given 01/04/22 1251)  budesonide (PULMICORT) nebulizer solution 0.5 mg (0.5 mg Nebulization Not Given 01/04/22 1230)  arformoterol (BROVANA) nebulizer solution 15 mcg (15 mcg Nebulization Not Given 01/04/22 1235)  revefenacin (YUPELRI) nebulizer solution  175 mcg (175 mcg Nebulization Given 01/04/22 1230)  dextrose 5 %-0.45 % sodium chloride infusion ( Intravenous New Bag/Given 01/04/22 1250)  acetaminophen (TYLENOL) tablet 650 mg (has no administration in time range)    Or  acetaminophen (TYLENOL) suppository 650 mg (has no administration in time range)  ondansetron (ZOFRAN) tablet 4 mg (has no administration in time range)    Or  ondansetron (ZOFRAN) injection 4 mg (has no administration in time range)  ceFEPIme (MAXIPIME) 2 g in sodium chloride 0.9 % 100 mL IVPB (has no administration in time range)  pantoprazole (PROTONIX) EC tablet 40 mg (40 mg Oral Given 01/04/22 1251)  albuterol (PROVENTIL) (2.5 MG/3ML) 0.083% nebulizer solution 2.5 mg (has no administration in time range)  ceFEPIme (MAXIPIME) 2 g in sodium chloride 0.9 % 100 mL IVPB (0 g Intravenous Stopped 01/04/22 1024)  ipratropium (ATROVENT) nebulizer solution 0.5 mg ( Nebulization Not Given 01/04/22 1001)  lactated ringers bolus 500 mL (500 mLs Intravenous New Bag/Given 01/04/22 1025)  lactated ringers bolus 1,200 mL (1,200 mLs Intravenous New Bag/Given 01/04/22 1126)  iohexol (OMNIPAQUE) 350 MG/ML  injection 75 mL (75 mLs Intravenous Contrast Given 01/04/22 1106)  dextrose 50 % solution (50 mLs  Given 01/04/22 1207)                                                                                                                                     Procedures .Critical Care  Performed by: Cristie Hem, MD Authorized by: Cristie Hem, MD   Critical care provider statement:    Critical care time (minutes):  30   Critical care time was exclusive of:  Separately billable procedures and treating other patients   Critical care was necessary to treat or prevent imminent or life-threatening deterioration of the following conditions:  Respiratory failure   Critical care was time spent personally by me on the following activities:  Development of treatment plan with patient  or surrogate, discussions with consultants, evaluation of patient's response to treatment, examination of patient, ordering and review of laboratory studies, ordering and review of radiographic studies, ordering and performing treatments and interventions, pulse oximetry, re-evaluation of patient's condition and review of old charts   Care discussed with: admitting provider     (including critical care time)  Medical Decision Making / ED Course   MDM:  86 year old female presenting to the emergency department with shortness of breath.  Obvious respiratory distress.  Improved with high flow nasal cannula in the emergency department.  Will trial heated high flow.  Patient reports she does not want chest compressions but would want to be intubated if comes down to that.  Likely recurrent pneumonia given productive sputum and rhonchorous breath sounds, could also represent CHF/pulmonary edema given leg swelling, pulmonary embolism given multiple recent hospitalizations although patient is on blood thinners.  Less likely COPD without wheezing but will trial breathing treatments.  Given mildly volume overload appearance, will defer fluids at this time.  Will give antibiotics to cover for hospital-acquired pneumonia.  Patient will need admission given worsening respiratory distress.  Clinical Course as of 01/04/22 1315  Fri Jan 04, 2022  1143 Admitted to medicine for hypoxic respiratory failure [WS]    Clinical Course User Index [WS] Cristie Hem, MD     Additional history obtained: -Additional history obtained from family and ems -External records from outside source obtained and reviewed including: Chart review including previous notes, labs, imaging, consultation notes including dc summary from recent discharge 3 days ago   Lab Tests: -I ordered, reviewed, and interpreted labs.   The pertinent results include:   Labs Reviewed  CBC WITH DIFFERENTIAL/PLATELET - Abnormal; Notable  for the following components:      Result Value   WBC 15.6 (*)    Hemoglobin 10.9 (*)    HCT 35.7 (*)    RDW 26.4 (*)    Neutro Abs 12.4 (*)    Abs Immature Granulocytes 0.11 (*)    All other components within normal limits  COMPREHENSIVE METABOLIC  PANEL - Abnormal; Notable for the following components:   Glucose, Bld 52 (*)    BUN 28 (*)    Calcium 11.7 (*)    ALT 81 (*)    All other components within normal limits  BLOOD GAS, VENOUS - Abnormal; Notable for the following components:   pO2, Ven <31 (*)    Bicarbonate 31.5 (*)    Acid-Base Excess 5.2 (*)    All other components within normal limits  LACTIC ACID, PLASMA - Abnormal; Notable for the following components:   Lactic Acid, Venous 2.5 (*)    All other components within normal limits  URINALYSIS, ROUTINE W REFLEX MICROSCOPIC - Abnormal; Notable for the following components:   APPearance HAZY (*)    Hgb urine dipstick SMALL (*)    Ketones, ur 5 (*)    Protein, ur 30 (*)    Non Squamous Epithelial 0-5 (*)    All other components within normal limits  CBG MONITORING, ED - Abnormal; Notable for the following components:   Glucose-Capillary 41 (*)    All other components within normal limits  CULTURE, BLOOD (ROUTINE X 2)  CULTURE, BLOOD (ROUTINE X 2)  RESP PANEL BY RT-PCR (FLU A&B, COVID) ARPGX2  MRSA NEXT GEN BY PCR, NASAL  EXPECTORATED SPUTUM ASSESSMENT W GRAM STAIN, RFLX TO RESP C  LACTIC ACID, PLASMA  BRAIN NATRIURETIC PEPTIDE  MAGNESIUM  PHOSPHORUS  TROPONIN I (HIGH SENSITIVITY)  TROPONIN I (HIGH SENSITIVITY)    Notable for leukocytosis, mild hypercalcemia, hypoglycemia, elevated lactic acid  EKG   EKG Interpretation  Date/Time:  Friday January 04 2022 10:15:33 EST Ventricular Rate:  72 PR Interval:  152 QRS Duration: 94 QT Interval:  376 QTC Calculation: 412 R Axis:   49 Text Interpretation: Sinus rhythm Confirmed by Garnette Gunner [phone]) on 01/04/2022 10:29:40 AM         Imaging Studies  ordered: I ordered imaging studies including CTA chest On my interpretation imaging demonstrates multifocal pneumonia I independently visualized and interpreted imaging. I agree with the radiologist interpretation   Medicines ordered and prescription drug management: Meds ordered this encounter  Medications   albuterol (PROVENTIL) (2.5 MG/3ML) 0.083% nebulizer solution 3 mL   ceFEPIme (MAXIPIME) 2 g in sodium chloride 0.9 % 100 mL IVPB   FOLLOWED BY Linked Order Group    vancomycin (VANCOREADY) IVPB 1750 mg/350 mL     Order Specific Question:   Indication:     Answer:   Pneumonia    vancomycin (VANCOCIN) IVPB 1000 mg/200 mL premix     Order Specific Question:   Indication:     Answer:   Pneumonia   ipratropium (ATROVENT) nebulizer solution 0.5 mg   ipratropium (ATROVENT) 0.02 % nebulizer solution    Hall, Christy L: cabinet override   lactated ringers bolus 500 mL   lactated ringers bolus 1,200 mL   iohexol (OMNIPAQUE) 350 MG/ML injection 75 mL   DISCONTD: arformoterol (BROVANA) nebulizer solution 15 mcg   DISCONTD: budesonide (PULMICORT) nebulizer solution 0.5 mg   DISCONTD: ipratropium-albuterol (DUONEB) 0.5-2.5 (3) MG/3ML nebulizer solution 3 mL   dextrose 50 % solution    Long, Linda G: cabinet override   methylPREDNISolone sodium succinate (SOLU-MEDROL) 40 mg/mL injection 40 mg    IV methylprednisolone will be converted to either a q12h or q24h frequency with the same total daily dose (TDD).  Ordered Dose: 1 to 125 mg TDD; convert to: TDD q24h.  Ordered Dose: 126 to 250 mg TDD; convert to: TDD div  q12h.  Ordered Dose: >250 mg TDD; DAW.   budesonide (PULMICORT) nebulizer solution 0.5 mg   arformoterol (BROVANA) nebulizer solution 15 mcg   revefenacin (YUPELRI) nebulizer solution 175 mcg   DISCONTD: ipratropium-albuterol (DUONEB) 0.5-2.5 (3) MG/3ML nebulizer solution 3 mL   dextrose 5 %-0.45 % sodium chloride infusion   OR Linked Order Group    acetaminophen (TYLENOL)  tablet 650 mg    acetaminophen (TYLENOL) suppository 650 mg   OR Linked Order Group    ondansetron (ZOFRAN) tablet 4 mg    ondansetron (ZOFRAN) injection 4 mg   ceFEPIme (MAXIPIME) 2 g in sodium chloride 0.9 % 100 mL IVPB   pantoprazole (PROTONIX) EC tablet 40 mg   albuterol (PROVENTIL) (2.5 MG/3ML) 0.083% nebulizer solution 2.5 mg    DC'd DUoneb since patient ordered on Yupelri.  Atrovent not needed with Maretta Bees    -I have reviewed the patients home medicines and have made adjustments as needed   Consultations Obtained: I requested consultation with the hospitalist,  and discussed lab and imaging findings as well as pertinent plan - they recommend: admission   Cardiac Monitoring: The patient was maintained on a cardiac monitor.  I personally viewed and interpreted the cardiac monitored which showed an underlying rhythm of: NSR  Social Determinants of Health:  Diagnosis or treatment significantly limited by social determinants of health: former smoker   Reevaluation: After the interventions noted above, I reevaluated the patient and found that they have improved  Co morbidities that complicate the patient evaluation  Past Medical History:  Diagnosis Date   Arthritis    Asthma    COPD (chronic obstructive pulmonary disease) ([place])    Diabetes mellitus without complication (HCC)    GERD (gastroesophageal reflux disease)    Headache    Hyperlipidemia    Hypertension    Macular degeneration    Neuropathy    Obesity    Osteopenia    Stress incontinence       Dispostion: Disposition decision including need for hospitalization was considered, and patient admitted to the hospital.    Final Clinical Impression(s) / ED Diagnoses Final diagnoses:  Acute on chronic hypoxic respiratory failure ([place])  Multifocal pneumonia  Severe sepsis ([place])     This chart was dictated using voice recognition software.  Despite best efforts to proofread,  errors can occur which can  change the documentation meaning.    Cristie Hem, MD 01/04/22 1315

## 2022-01-04 NOTE — ED Notes (Addendum)
Date and time results received: 01/04/22 0956  Test: Lactic acid Critical Value: 2.5  Name of Provider Notified: Garnette Gunner  No new orders received. Will continue to monitor.

## 2022-01-04 NOTE — Assessment & Plan Note (Signed)
Continue statin. 

## 2022-01-04 NOTE — Assessment & Plan Note (Signed)
-  With a high risk for aspiration -Continue dysphagia 2 diet with nectar thick liquids. -Education provided for upright position during meals and after meals to continue minimizing chance of aspiration. -[name] reports having hospital bed for her at home.

## 2022-01-04 NOTE — Assessment & Plan Note (Addendum)
-  Patient uses 5 L nasal cannula supplementation at baseline -Currently requiring high flow nasal cannula supplementation to maintain adequate saturation. -Presentation most likely associated with pneumonitis process on top of recently aspiration pneumonia. -MRSA PCR negative -Currently afebrile and overall improving. -Will continue treatment for COPD exacerbation; continue augmentin and fluconazole. -Requiring 8-9 L high flow nasal cannula currently. -Continue to wean oxygen supplementation as tolerated -Follow clinical response.  Slowly improving. -Given inability to change high risk aspiration component patient remains at high risk for decompensation with overall poor long-term prognosis. -Case discussed with son and patient at bedside; also discussion by palliative care.  Patient remains DNR/DNI.  Continue treating condition.  Appreciative and open for potential discharge home with hospice.

## 2022-01-04 NOTE — ED Triage Notes (Signed)
Pt via POV with her son who states that at around 7am this morning she began having difficulty breathing. She wears 5L oxygen at baseline. Her son increased flow rate to 8lpm and pt's O2 saturation was still in the mid-80s. Pt was noted to be at 79% on arrival to ER with 10LPM oxygen. Pt is able to speak but is obviously struggling to breathe with audible crackles

## 2022-01-04 NOTE — H&P (Signed)
History and Physical    Patient: Michaela Moon [id number]:[id number] DOB: 1935-10-14 DOA: 01/04/2022 DOS: the patient was seen and examined on 01/04/2022 PCP: [name], MD  Patient coming from: Home  Chief Complaint:  Chief Complaint  Patient presents with   Respiratory Distress   HPI: Michaela Moon is a 86 y.o. female with medical history significant of COPD, diabetes, hypertension, lower extremity DVT (on chronic Eliquis), diagnosis of COVID in September 2023 and recent hospitalization for aspiration pneumonia with subsequent decline in her health status.  Patient chronically on 5 L nasal cannula supplementation due to ongoing hypoxia.  Actively still with discharge prescriptions for steroids tapering and oral antibiotics.  Who presented to the emergency department secondary to worsening respiratory failure and hypoxia into the mid 80s despite the use of 10 L of oxygen supplementation.  Patient reports that after discharge (01/02/2022) she was doing okay until early this morning when she woke up having worsening respiratory distress.  Patient found with increased tachypnea and poor air movement.  Patient expressed positive productive coughing spells and general malaise.  There has not been any fever, chills, sick contacts, reported nausea, vomiting, dysuria, hematuria, melena, hematochezia or focal weaknesses.  In the ED CT angiogram demonstrated no pulmonary embolism and positive extensive bilateral heterogeneous and consolidative airspace opacities consistent with multifocal pneumonia/aspiration.   Review of Systems: As mentioned in the history of present illness. All other systems reviewed and are negative. Past Medical History:  Diagnosis Date   Arthritis    Asthma    COPD (chronic obstructive pulmonary disease) ([place])    Diabetes mellitus without complication (HCC)    GERD (gastroesophageal reflux disease)    Headache    Hyperlipidemia    Hypertension    Macular degeneration     Neuropathy    Obesity    Osteopenia    Stress incontinence    Past Surgical History:  Procedure Laterality Date   BREAST LUMPECTOMY Bilateral    no cancer   CARPAL TUNNEL RELEASE Left 10/23/2017   Procedure: LEFT CARPAL TUNNEL RELEASE;  Surgeon: Carole Civil, MD;  Location: [facility];  Service: Orthopedics;  Laterality: Left;   CARPAL TUNNEL RELEASE Right 11/20/2017   Procedure: CARPAL TUNNEL RELEASE;  Surgeon: Carole Civil, MD;  Location: [facility];  Service: Orthopedics;  Laterality: Right;   CATARACT EXTRACTION W/PHACO Left 11/01/2016   Procedure: CATARACT EXTRACTION PHACO AND INTRAOCULAR LENS PLACEMENT (IOC);  Surgeon: Baruch Goldmann, MD;  Location: [facility];  Service: Ophthalmology;  Laterality: Left;  CDE: 3.83   CATARACT EXTRACTION W/PHACO Right 11/29/2016   Procedure: CATARACT EXTRACTION PHACO AND INTRAOCULAR LENS PLACEMENT RIGHT EYE;  Surgeon: Baruch Goldmann, MD;  Location: [facility];  Service: Ophthalmology;  Laterality: Right;  CDE: 8.71   colon tumor removed  2010   Dr. 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 submucosal lipoma on path   COLONOSCOPY  05/2013   Dr. Anthony Sar: normal. (h/o colon polyps)   COLONOSCOPY  2011   normal   COLONOSCOPY  10/2003   Dr. Lindalou Hose: large polypoid mass hepatic fluexure   ESOPHAGOGASTRODUODENOSCOPY N/A 04/29/2016   Procedure: ESOPHAGOGASTRODUODENOSCOPY (EGD);  Surgeon: Danie Binder, MD;  Location: [facility];  Service: Endoscopy;  Laterality: N/A;  9:15am   SAVORY DILATION N/A 04/29/2016   Procedure: SAVORY DILATION;  Surgeon: Danie Binder, MD;  Location: [facility];  Service: Endoscopy;  Laterality: N/A;   Social History:  reports that she quit  smoking about 19 years ago. Her smoking use included cigarettes. She has a 40.00 pack-year smoking history. She has never used smokeless tobacco. She reports that she does not drink alcohol and does not use drugs.  Allergies  Allergen Reactions   Onion      wheezing    Family History  Problem Relation Age of Onset   Melanoma Brother    Colon cancer Neg Hx     Prior to Admission medications   Medication Sig Start Date End Date Taking? Authorizing Provider  albuterol (PROVENTIL) (2.5 MG/3ML) 0.083% nebulizer solution Take 3 mLs (2.5 mg total) by nebulization every 6 (six) hours as needed for wheezing or shortness of breath. 02/14/21  Yes Chesley Mires, MD  albuterol-ipratropium (COMBIVENT) 18-103 MCG/ACT inhaler Inhale 2 puffs into the lungs every 6 (six) hours as needed for wheezing. 11/27/12  Yes Vernie Shanks, MD  amoxicillin-clavulanate (AUGMENTIN) 875-125 MG tablet Take 1 tablet by mouth 2 (two) times daily for 5 days. 01/01/22 01/06/22 Yes Emokpae, Courage, MD  apixaban (ELIQUIS) 5 MG TABS tablet Take 1 tablet (5 mg total) by mouth 2 (two) times daily. 12/20/21  Yes Johnson, Clanford L, MD  aspirin 81 MG tablet Take 1 tablet (81 mg total) by mouth daily with breakfast. 01/01/22  Yes Emokpae, Courage, MD  atorvastatin (LIPITOR) 80 MG tablet ALTERNATE TAKING 1 TABLET EVERY OTHER DAY WITH 1/2 TABLET EVERY OTHERDAY 01/13/14  Yes Chipper Herb, MD  Biotin w/ Vitamins C & E (HAIR/SKIN/NAILS PO) Take 1 tablet by mouth daily. Takes once a day.   Yes [provider]  Budeson-Glycopyrrol-Formoterol (BREZTRI AEROSPHERE) 160-9-4.8 MCG/ACT AERO Inhale 2 puffs into the lungs in the morning and at bedtime. 03/08/21  Yes Chesley Mires, MD  docusate sodium (COLACE) 100 MG capsule Take 1 capsule (100 mg total) by mouth daily. 12/04/21 12/04/22 Yes Shah, Pratik D, DO  ferrous sulfate 325 (65 FE) MG tablet Take 1 tablet (325 mg total) by mouth daily. 12/04/21 12/04/22 Yes Shah, Pratik D, DO  fluconazole (DIFLUCAN) 200 MG tablet Take 1 tablet (200 mg total) by mouth daily. 01/02/22  Yes Emokpae, Courage, MD  fluticasone (FLONASE) 50 MCG/ACT nasal spray Place 1 spray into both nostrils daily. 10/19/20  Yes Chesley Mires, MD  gabapentin (NEURONTIN) 100 MG  capsule Take 1 capsule (100 mg total) by mouth 3 (three) times daily. 12/20/21  Yes Johnson, Clanford L, MD  glimepiride (AMARYL) 2 MG tablet Take 4 mg by mouth daily with breakfast.    Yes [provider]  guaiFENesin (MUCINEX) 600 MG 12 hr tablet Take 2 tablets (1,200 mg total) by mouth 2 (two) times daily as needed for cough or to loosen phlegm. 01/01/22  Yes Emokpae, Courage, MD  Hypromellose (ARTIFICIAL TEARS OP) Place 1 drop into both eyes 4 (four) times daily.   Yes [provider]  LANTUS SOLOSTAR 100 UNIT/ML Solostar Pen Inject 20 Units into the skin in the morning and at bedtime. 11/22/20  Yes [provider]  losartan-hydrochlorothiazide (HYZAAR) 100-12.5 MG per tablet TAKE ONE (1) TABLET EACH DAY Patient taking differently: Take 1 tablet by mouth daily. 03/09/14  Yes Chipper Herb, MD  metoprolol (LOPRESSOR) 50 MG tablet Take 50 mg by mouth 2 (two) times daily.   Yes [provider]  montelukast (SINGULAIR) 10 MG tablet TAKE ONE TABLET BY MOUTH AT BEDTIME 07/30/21  Yes Chesley Mires, MD  Multiple Vitamins-Minerals (CENTRUM SILVER PO) Take 1 tablet by mouth daily.   Yes [provider]  Omega-3 Fatty Acids (FISH OIL) 1000 MG CAPS Take 1,000 mg by mouth in the morning, at noon, and at bedtime.   Yes [provider]  ondansetron (ZOFRAN) 4 MG tablet Take 1 tablet (4 mg total) by mouth every 6 (six) hours as needed for nausea. 01/01/22  Yes Emokpae, Courage, MD  oxybutynin (DITROPAN) 5 MG tablet Take 5 mg by mouth every 8 (eight) hours as needed for bladder spasms.    Yes [provider]  pantoprazole (PROTONIX) 40 MG tablet Take 1 tablet (40 mg total) by mouth daily. 01/01/22  Yes Emokpae, Courage, MD  polyethylene glycol (MIRALAX / GLYCOLAX) 17 g packet Take 17 g by mouth daily as needed for mild constipation. 12/20/21  Yes Johnson, Clanford L, MD  predniSONE (DELTASONE) 10 MG tablet Take 1 tablet (10 mg total) by mouth daily with  breakfast. Take 40 mg (4 tab) daily for 3 days , then 30 mg (3 Tab) daily for 3 days, then Take 20 mg (2 Tab) daily for 3 days, then Take 10 mg (10 mg) daily for 3 days... Then STOP 01/01/22  Yes Emokpae, Courage, MD  sitaGLIPtin-metformin (JANUMET) 50-1000 MG tablet Take 1 tablet by mouth 2 (two) times daily with a meal.   Yes [provider]  triamcinolone cream (KENALOG) 0.1 % Apply 1 application topically daily as needed (eczema).   Yes [provider]    Physical Exam: Vitals:   01/04/22 1530 01/04/22 1600 01/04/22 1638 01/04/22 1700  BP: (!) 128/41 (!) 114/36 (!) 132/38 (!) 165/67  Pulse: 78 73 75 94  Resp: '18 16 12 '$ (!) 26  Temp:   98.4 F (36.9 C)   TempSrc:   Oral   SpO2: 92% 92% 93% 95%  Weight:      Height:       General exam: Alert, awake, following commands appropriately and demonstrating difficulty speaking in full sentences.  Decreased breath sounds bilaterally and requiring high flow nasal cannula supplementation. Respiratory system: Poor air movement, positive rhonchi and mild expiratory wheezing appreciated. Cardiovascular system:RRR. No rubs or gallops; no JVD. Gastrointestinal system: Abdomen is nondistended, soft and nontender. No organomegaly or masses felt. Normal bowel sounds heard. Central nervous system: No focal neurological deficits. Extremities: No cyanosis, clubbing or edema. Skin: No petechiae.  Present on admission stage I mid sacrum pressure injury appreciated on exam; no signs of superimposed infection. Psychiatry: Mood & affect appropriate.   Data Reviewed: CBC: WBCs 15.6, hemoglobin 10.9 and platelet count 156 K Comprehensive metabolic panel: Sodium 119, potassium 4.0, chloride 102, bicarb 27, blood sugar 52, BUN 28 and creatinine 0.69; normal LFTs appreciated. Lactic acid: 2.5 BNP: 74 Urinalysis not suggestive of UTI Troponin x2 negative Magnesium: 1.5  Assessment and Plan: * Acute on chronic respiratory failure with hypoxia  (HCC) -Patient uses 5 L nasal cannula supplementation at baseline -Currently requiring high flow nasal cannula supplementation to maintain adequate saturation. -Presentation most likely associated with pneumonitis process on top of recently aspiration pneumonia. -Component of COPD exacerbation also playing a role. -Patient with very low reserve capacity. -Concerns of presentation and worsening WBCs initial therapy with broad-spectrum antibiotics will be initiated. -Check MRSA PCR. -Continue to wean off oxygen supplementation as tolerated -Continue bronchodilator management, Pulmicort, Brovana and yupelri. -Continue IV steroids, the use of flutter valve and supportive care. -Overall prognosis is guarded and poor long term. -Follow clinical response.  Dysphagia -With a high Moon for aspiration -Continue dysphagia 2 diet with nectar thick liquids.  Chronic diastolic HF (heart failure) (HCC) -No vascular congestion appreciated on x-ray or CT scan -Continue to follow daily weights and strict I's and O's -Continue home medication regimen.  Insulin dependent type 2 diabetes mellitus (HCC) -Mild hypoglycemic event at time of admission in the setting of poor oral intake. -Patient will be receiving steroids which will help with low sugars prevention. -Long acting insulin dose has been adjusted and planning to be resumed on 01/05/2022. -Sensitive sliding scale insulin will be provided. -Recent A1c 6.3  Lactic acidosis -In the setting of respiratory distress and multiple use of albuterol prior to admission-most likely -Maintain adequate oral hydration -Follow lactic acid level.  Pressure injury of skin -Present at time of admission -No signs of superimposed infection -Provide preventive measures and constant repositioning.  GERD (gastroesophageal reflux disease) -Continue PPI -Dose adjusted to twice a day while receiving steroids for further GI prophylaxis.  Mixed  hyperlipidemia -Continue statin  Essential hypertension -Appears to be a stable labetalol -Resume home antihypertensive agents-follow-up vital signs.    Advance Care Planning:   Code Status: DNR   Consults: none   Family Communication: No family at bedside.  Severity of Illness: The appropriate patient status for this patient is INPATIENT. Inpatient status is judged to be reasonable and necessary in order to provide the required intensity of service to ensure the patient's safety. The patient's presenting symptoms, physical exam findings, and initial radiographic and laboratory data in the context of their chronic comorbidities is felt to place them at high Moon for further clinical deterioration. Furthermore, it is not anticipated that the patient will be medically stable for discharge from the hospital within 2 midnights of admission.   * I certify that at the point of admission it is my clinical judgment that the patient will require inpatient hospital care spanning beyond 2 midnights from the point of admission due to high intensity of service, high Moon for further deterioration and high frequency of surveillance required.*  Author: Barton Dubois, MD 01/04/2022 5:32 PM  For on call review [email or web address].

## 2022-01-04 NOTE — Assessment & Plan Note (Signed)
-  Continue PPI -Dose adjusted to twice a day while receiving steroids for further GI prophylaxis.

## 2022-01-04 NOTE — Progress Notes (Signed)
Pharmacy Antibiotic Note  Michaela Moon is a 86 y.o. female admitted on 01/04/2022 with pneumonia.  Pharmacy has been consulted for Vancomycin dosing.  Plan: Vancomycin '1750mg'$  IV loading dose, then 1000 mg IV Q 24 hrs. Goal AUC 400-550. Expected AUC: 446 SCr used: 0.8(actual 0.46) F/U cxs and clinical progress Monitor V/S, labs and levels as indicated   Height: '5\' 5"'$  (165.1 cm) Weight: 79.4 kg (175 lb) IBW/kg (Calculated) : 57  Temp (24hrs), Avg:97.7 F (36.5 C), Min:97.7 F (36.5 C), Max:97.7 F (36.5 C)  Recent Labs  Lab 12/28/21 1135 12/29/21 0334 12/30/21 0325 12/31/21 0353  WBC  --  10.2 9.1 9.9  CREATININE  --   --  0.46  --   VANCOTROUGH 7*  --   --   --     Estimated Creatinine Clearance: 54.5 mL/min (by C-G formula based on SCr of 0.46 mg/dL).    Allergies  Allergen Reactions   Onion     wheezing    Antimicrobials this admission: Vancomycin 11/10 >>  Cefepime 11/10  Microbiology results: 11/10 BCx: pending 11/10 UCx: pending   MRSA PCR:   Thank you for allowing pharmacy to be a part of this patient's care.  Isac Sarna, BS Pharm D, BCPS Clinical Pharmacist 01/04/2022 9:22 AM

## 2022-01-04 NOTE — Assessment & Plan Note (Signed)
-  Appears to be a stable overall -continue home antihypertensive agents -follow-up vital signs.

## 2022-01-04 NOTE — Inpatient Diabetes Management (Signed)
Inpatient Diabetes Program Recommendations  AACE/ADA: New Consensus Statement on Inpatient Glycemic Control (2015)  Target Ranges:  Prepandial:   less than 140 mg/dL      Peak postprandial:   less than 180 mg/dL (1-2 hours)      Critically ill patients:  140 - 180 mg/dL   Lab Results  Component Value Date   GLUCAP 41 (LL) 01/04/2022   HGBA1C 6.3 (H) 12/08/2021    Latest Reference Range & Units 01/04/22 12:01  Glucose-Capillary 70 - 99 mg/dL 41 (LL)  (LL): Data is critically low  Diabetes history: DM2 Outpatient Diabetes medications: Lantus 20 units BID, Janumet 50-1000 mg BID, Amaryl 4 mg daily Current orders for Inpatient glycemic control: CBGs tid, Solumedrol 40 mg bid  Inpatient Diabetes Program Recommendations:   Noted patient admitted with hypglycemia. Patient was discharged on 01/01/22. Please consider on discharge: -Decrease Lantus to 15 units qd -Decrease Amaryl to 1 mg or D/C @ discharge  Thank you, Nani Gasser. Annarose Ouellet, RN, MSN, CDE  Diabetes Coordinator Inpatient Glycemic Control Team Team Pager [phone] (8am-5pm) 01/04/2022 12:52 PM

## 2022-01-04 NOTE — Assessment & Plan Note (Signed)
-  In the setting of respiratory distress and multiple use of albuterol prior to admission-most likely. -Latic acid down to 3.1 -Patient overall improving -Advised to maintain adequate oral hydration -No signs of sepsis or hemodynamic unstability currently.

## 2022-01-04 NOTE — ED Notes (Signed)
Date and time results received: 01/04/22 1202   Test: CBG Critical Value: 41  Name of Provider Notified: Barton Dubois notified by ED charge nurse L. Long  Standing orders implemented, see MAR

## 2022-01-05 DIAGNOSIS — K219 Gastro-esophageal reflux disease without esophagitis: Secondary | ICD-10-CM | POA: Diagnosis not present

## 2022-01-05 DIAGNOSIS — J9621 Acute and chronic respiratory failure with hypoxia: Secondary | ICD-10-CM | POA: Diagnosis not present

## 2022-01-05 DIAGNOSIS — J690 Pneumonitis due to inhalation of food and vomit: Secondary | ICD-10-CM | POA: Diagnosis not present

## 2022-01-05 DIAGNOSIS — J189 Pneumonia, unspecified organism: Secondary | ICD-10-CM | POA: Diagnosis not present

## 2022-01-05 DIAGNOSIS — I5032 Chronic diastolic (congestive) heart failure: Secondary | ICD-10-CM

## 2022-01-05 LAB — GLUCOSE, CAPILLARY
Glucose-Capillary: 229 mg/dL — ABNORMAL HIGH (ref 70–99)
Glucose-Capillary: 275 mg/dL — ABNORMAL HIGH (ref 70–99)
Glucose-Capillary: 315 mg/dL — ABNORMAL HIGH (ref 70–99)
Glucose-Capillary: 176 mg/dL — ABNORMAL HIGH (ref 70–99)
Glucose-Capillary: 222 mg/dL — ABNORMAL HIGH (ref 70–99)

## 2022-01-05 LAB — LACTIC ACID, PLASMA
Lactic Acid, Venous: 4.2 mmol/L (ref 0.5–1.9)
Lactic Acid, Venous: 3.1 mmol/L (ref 0.5–1.9)

## 2022-01-05 LAB — BASIC METABOLIC PANEL
Anion gap: 6 (ref 5–15)
BUN: 21 mg/dL (ref 8–23)
CO2: 24 mmol/L (ref 22–32)
Calcium: 10.1 mg/dL (ref 8.9–10.3)
Chloride: 104 mmol/L (ref 98–111)
Creatinine, Ser: 0.6 mg/dL (ref 0.44–1.00)
GFR, Estimated: >60 mL/min (ref >60)
Glucose, Bld: 253 mg/dL — ABNORMAL HIGH (ref 70–99)
Potassium: 4.8 mmol/L (ref 3.5–5.1)
Sodium: 134 mmol/L — ABNORMAL LOW (ref 135–145)

## 2022-01-05 MED ORDER — INSULIN GLARGINE-YFGN 100 UNIT/ML ~~LOC~~ SOLN
16.0000 [IU] | Freq: Every day | SUBCUTANEOUS | Status: DC
  Administered 2022-01-05 – 2022-01-07 (×3): 16 [IU] via SUBCUTANEOUS
  Filled 2022-01-05 (×4): qty 0.16

## 2022-01-05 MED ORDER — ORAL CARE MOUTH RINSE: 15.0000 mL | OROMUCOSAL | Status: DC | PRN

## 2022-01-05 MED ORDER — ORAL CARE MOUTH RINSE
15.0000 mL | OROMUCOSAL | Status: DC
  Administered 2022-01-05 – 2022-01-12 (×29): 15 mL via OROMUCOSAL

## 2022-01-05 MED ORDER — AMOXICILLIN-POT CLAVULANATE 875-125 MG PO TABS
1.0000 | ORAL_TABLET | Freq: Two times a day (BID) | ORAL | Status: DC
  Administered 2022-01-05 – 2022-01-12 (×14): 1 via ORAL
  Filled 2022-01-05 (×14): qty 1

## 2022-01-05 NOTE — Progress Notes (Signed)
Date and time results received: 01/05/22 1036 (use smartphrase ".now" to insert current time)  Test: lactic acid Critical Value: 4.2  Name of Provider Notified: Dr. Dyann Kief  Orders Received? Or Actions Taken?: Dr. Dyann Kief aware, said no need for fluids at this time, all respiratory driven.

## 2022-01-05 NOTE — Progress Notes (Signed)
Progress Note   Patient: Michaela Moon JXB:[id number] DOB: Aug 16, 1935 DOA: 01/04/2022     1 DOS: the patient was seen and examined on 01/05/2022   Brief hospital course:  Michaela Moon is a 86 y.o. female with medical history significant of COPD, diabetes, hypertension, lower extremity DVT (on chronic Eliquis), diagnosis of COVID in September 2023 and recent hospitalization for aspiration pneumonia with subsequent decline in her health status.  Patient chronically on 5 L nasal cannula supplementation due to ongoing hypoxia.  Actively still with discharge prescriptions for steroids tapering and oral antibiotics.  Who presented to the emergency department secondary to worsening respiratory failure and hypoxia into the mid 80s despite the use of 10 L of oxygen supplementation.  Patient reports that after discharge (01/02/2022) she was doing okay until early this morning when she woke up having worsening respiratory distress.  Patient found with increased tachypnea and poor air movement.  Patient expressed positive productive coughing spells and general malaise.  There has not been any fever, chills, sick contacts, reported nausea, vomiting, dysuria, hematuria, melena, hematochezia or focal weaknesses.   In the ED CT angiogram demonstrated no pulmonary embolism and positive extensive bilateral heterogeneous and consolidative airspace opacities consistent with multifocal pneumonia/aspiration.  Assessment and Plan: * Acute on chronic respiratory failure with hypoxia (HCC) -Patient uses 5 L nasal cannula supplementation at baseline -Currently requiring high flow nasal cannula supplementation to maintain adequate saturation. -Presentation most likely associated with pneumonitis process on top of recently aspiration pneumonia. -MRSA PCR negative -Currently afebrile and overall improving. -We will continue treatment for COPD exacerbation; narrow antibiotics to the use of Augmentin and  fluconazole -Continue to wean oxygen supplementation as tolerated -Follow clinical response. -Given inability to change high risk aspiration component patient remains at high risk for decompensation with overall poor long-term prognosis.  Dysphagia -With a high risk for aspiration -Continue dysphagia 2 diet with nectar thick liquids. -Education provided for upright position during meals and after meals to continue minimizing chance of aspiration. -[name] reports having hospital bed for her at home.  Chronic diastolic HF (heart failure) (HCC) -No vascular congestion appreciated on x-ray or CT scan -Continue to follow daily weights and strict I's and O's -Continue home medication regimen. -Maintain adequate hydration.  Insulin dependent type 2 diabetes mellitus (HCC) -Mild hypoglycemic event at time of admission in the setting of poor oral intake. -Patient will be receiving steroids which will help with low sugars prevention. -continue SSI and levemir -Recent A1c 6.3 -Follow CBGs fluctuation.  Lactic acidosis -In the setting of respiratory distress and multiple use of albuterol prior to admission-most likely. -Latic acid 4.2 -Patient overall improving -Advised to maintain adequate oral hydration -No signs of sepsis or hemodynamic unstability currently.   Pressure injury of skin -Present at time of admission -No signs of superimposed infection -Provide preventive measures and constant repositioning.  GERD (gastroesophageal reflux disease) -Continue PPI -Dose adjusted to twice a day while receiving steroids for further GI prophylaxis.  Mixed hyperlipidemia -Continue statin  Essential hypertension -Appears to be a stable overall -continue home antihypertensive agents -follow-up vital signs.   Subjective:  Reporting intermittent episode of productive coughing spells; short winded and having difficulty speaking in full sentences.  Diffuse rhonchi on auscultation.   Requiring high flow nasal cannula supplementation.  No fever.  Physical Exam: Vitals:   01/05/22 1315 01/05/22 1330 01/05/22 1400 01/05/22 1600  BP: (!) 118/33 (!) 108/46 (!) 113/42 (!) 123/48  Pulse: 62 61 60 62  Resp: '14 18 14 19  '$ Temp:      TempSrc:      SpO2: 95% 94% 90% 91%  Weight:      Height:       General exam: Alert, awake, oriented x 3; reports improvement in her breathing; still short winded, having difficulty speaking in full sentences and demonstrating productive coughing spells.  No fever. Respiratory system: Diffuse rhonchi bilaterally; mild expiratory wheezing.  No using accessory muscle. Cardiovascular system:RRR. No rubs or gallops. Gastrointestinal system: Abdomen is nondistended, soft and nontender. No organomegaly or masses felt. Normal bowel sounds heard. Central nervous system: No focal neurological deficits. Extremities: No cyanosis or clubbing. Skin: No petechiae.  Present at time of admission stage I pressure injury in her sacrum.  No signs of superimposed infection appreciated. Psychiatry: Judgement and insight appear normal. Mood & affect appropriate.    Data Reviewed: Lactic acid: 4.2> 3.1 CBC: WBCs 9.1, hemoglobin 9.6 and platelet count 967 K Basic metabolic panel: Sodium 893, potassium 4.8, chloride 104, bicarb 24, CBGs 253, BUN 21 and creatinine 0.60  Family Communication: Son at bedside  Disposition: Status is: Inpatient Remains inpatient appropriate because: Continue treatment for acute on chronic respiratory failure with hypoxia in the setting of pneumonitis/pneumonia and COPD exacerbation.   Planned Discharge Destination: Home with Home Health  Time spent: 50 minutes  Author: Barton Dubois, MD 01/05/2022 4:27 PM  For on call review [email or web address].

## 2022-01-05 NOTE — Progress Notes (Signed)
Patient slept through 2 am treatment.  Did not do CPT at that time.  [name] also brought patient's home flutter; it is at bedside for patient to use as well as CPT through bed.

## 2022-01-05 NOTE — Progress Notes (Signed)
Chaplain engaged in an initial visit with Clarita and her son, [name].  [name] explained that Ms. Izzabell is at a crossroads.  While she wants to keep fighting, they both recognize how hard it has been on her to keep coming back and forth to the hospital.  They recognize that Ms. Ellajane is not improving long-term.  [name] expressed voicing to his mom that he will be ok if she is holding on for him.  Chaplain assesses that Sharnise and [name] are at a place of having end of life conversations and thinking deeply about how that will look.  They desired prayer for guidance.    Chaplain offered a prayer of guidance and trust over them.  Chaplain also mentioned them speaking with Palliative Care as they seem to need some discussion around next steps.  Nurse let Chaplain know that a consult has already been input.  Chaplain believes that will be a valuable service to them.   Chaplain provided spiritual care support, presence, and reflective listening.     01/05/22 1100  Clinical Encounter Type  Visited With Patient and family together  Visit Type Initial;Spiritual support  Referral From Family;Nurse  Consult/Referral To Chaplain  Spiritual Encounters  Spiritual Needs Prayer

## 2022-01-06 DIAGNOSIS — J9621 Acute and chronic respiratory failure with hypoxia: Secondary | ICD-10-CM | POA: Diagnosis not present

## 2022-01-06 DIAGNOSIS — J189 Pneumonia, unspecified organism: Secondary | ICD-10-CM | POA: Diagnosis not present

## 2022-01-06 DIAGNOSIS — K219 Gastro-esophageal reflux disease without esophagitis: Secondary | ICD-10-CM | POA: Diagnosis not present

## 2022-01-06 DIAGNOSIS — J690 Pneumonitis due to inhalation of food and vomit: Secondary | ICD-10-CM | POA: Diagnosis not present

## 2022-01-06 LAB — BASIC METABOLIC PANEL
Anion gap: 5 (ref 5–15)
BUN: 18 mg/dL (ref 8–23)
CO2: 28 mmol/L (ref 22–32)
Calcium: 10.8 mg/dL — ABNORMAL HIGH (ref 8.9–10.3)
Chloride: 104 mmol/L (ref 98–111)
Creatinine, Ser: 0.58 mg/dL (ref 0.44–1.00)
GFR, Estimated: >60 mL/min (ref >60)
Glucose, Bld: 152 mg/dL — ABNORMAL HIGH (ref 70–99)
Potassium: 4.4 mmol/L (ref 3.5–5.1)
Sodium: 137 mmol/L (ref 135–145)

## 2022-01-06 LAB — GLUCOSE, CAPILLARY
Glucose-Capillary: 265 mg/dL — ABNORMAL HIGH (ref 70–99)
Glucose-Capillary: 248 mg/dL — ABNORMAL HIGH (ref 70–99)
Glucose-Capillary: 265 mg/dL — ABNORMAL HIGH (ref 70–99)
Glucose-Capillary: 408 mg/dL — ABNORMAL HIGH (ref 70–99)
Glucose-Capillary: 413 mg/dL — ABNORMAL HIGH (ref 70–99)

## 2022-01-06 LAB — CBC
HCT: 28.5 % — ABNORMAL LOW (ref 36.0–46.0)
Hemoglobin: 8.8 g/dL — ABNORMAL LOW (ref 12.0–15.0)
MCH: 27.4 pg (ref 26.0–34.0)
MCHC: 30.9 g/dL (ref 30.0–36.0)
MCV: 88.8 fL (ref 80.0–100.0)
Platelets: 135 10*3/uL — ABNORMAL LOW (ref 150–400)
RBC: 3.21 MIL/uL — ABNORMAL LOW (ref 3.87–5.11)
RDW: 25.1 % — ABNORMAL HIGH (ref 11.5–15.5)
WBC: 10.5 10*3/uL (ref 4.0–10.5)
nRBC: 0 % (ref 0.0–0.2)

## 2022-01-06 LAB — MAGNESIUM: Magnesium: 1.7 mg/dL (ref 1.7–2.4)

## 2022-01-06 MED ORDER — INSULIN ASPART 100 UNIT/ML IJ SOLN
10.0000 [IU] | Freq: Once | INTRAMUSCULAR | Status: AC
  Administered 2022-01-06: 10 [IU] via SUBCUTANEOUS

## 2022-01-06 NOTE — Progress Notes (Signed)
Patient had her COVID RT PCR positive for 01/04/2022. Dr. Dyann Kief notified, said he talked to the ID  and we dont need to initiate the airborne precautions but general precautions like mask and hand hygiene is always beneficial. Will continue to monitor.

## 2022-01-06 NOTE — Progress Notes (Signed)
Progress Note   Patient: Michaela Moon:[id number] DOB: 08-Jan-1936 DOA: 01/04/2022     2 DOS: the patient was seen and examined on 01/06/2022   Brief hospital course:  Michaela Moon is a 86 y.o. female with medical history significant of COPD, diabetes, hypertension, lower extremity DVT (on chronic Eliquis), diagnosis of COVID in September 2023 and recent hospitalization for aspiration pneumonia with subsequent decline in her health status.  Patient chronically on 5 L nasal cannula supplementation due to ongoing hypoxia.  Actively still with discharge prescriptions for steroids tapering and oral antibiotics.  Who presented to the emergency department secondary to worsening respiratory failure and hypoxia into the mid 80s despite the use of 10 L of oxygen supplementation.  Patient reports that after discharge (01/02/2022) she was doing okay until early this morning when she woke up having worsening respiratory distress.  Patient found with increased tachypnea and poor air movement.  Patient expressed positive productive coughing spells and general malaise.  There has not been any fever, chills, sick contacts, reported nausea, vomiting, dysuria, hematuria, melena, hematochezia or focal weaknesses.   In the ED CT angiogram demonstrated no pulmonary embolism and positive extensive bilateral heterogeneous and consolidative airspace opacities consistent with multifocal pneumonia/aspiration.  Assessment and Plan: * Acute on chronic respiratory failure with hypoxia (HCC) -Patient uses 5 L nasal cannula supplementation at baseline -Currently requiring high flow nasal cannula supplementation to maintain adequate saturation. -Presentation most likely associated with pneumonitis process on top of recently aspiration pneumonia. -MRSA PCR negative -Currently afebrile and overall improving. -Will continue treatment for COPD exacerbation; continue augmentin and fluconazole. -Continue to wean oxygen  supplementation as tolerated -Follow clinical response. -Given inability to change high risk aspiration component patient remains at high risk for decompensation with overall poor long-term prognosis.  Dysphagia -With a high risk for aspiration -Continue dysphagia 2 diet with nectar thick liquids. -Education provided for upright position during meals and after meals to continue minimizing chance of aspiration. -[name] reports having hospital bed for her at home.  Chronic diastolic HF (heart failure) (HCC) -No vascular congestion appreciated on x-ray or CT scan -Continue to follow daily weights and strict I's and O's -Continue home medication regimen. -Maintain adequate hydration.  Insulin dependent type 2 diabetes mellitus (HCC) -Mild hypoglycemic event at time of admission in the setting of poor oral intake. -Patient will be receiving steroids which will help with low sugars prevention. -continue SSI and levemir -Recent A1c 6.3 -Follow CBGs fluctuation.  Lactic acidosis -In the setting of respiratory distress and multiple use of albuterol prior to admission-most likely. -Latic acid down to 3.1 -Patient overall improving -Advised to maintain adequate oral hydration -No signs of sepsis or hemodynamic unstability currently.   Pressure injury of skin -Present at time of admission -No signs of superimposed infection -Provide preventive measures and constant repositioning.  GERD (gastroesophageal reflux disease) -Continue PPI -Dose adjusted to twice a day while receiving steroids for further GI prophylaxis.  Mixed hyperlipidemia -Continue statin  Essential hypertension -Appears to be a stable overall -continue home antihypertensive agents -follow-up vital signs.   Subjective:  Afebrile, no chest pain, no nausea or vomiting.  Reporting intermittent episodes of coughing spells (productive); also reporting expiratory wheezing, short winded sensation and requiring 11 L  high flow nasal cannula.  Physical Exam: Vitals:   01/06/22 0737 01/06/22 0800 01/06/22 0830 01/06/22 0900  BP:  (!) 169/54  (!) 150/56  Pulse:  92 (!) 116 (!) 115  Resp:  16  (!)  21  Temp:      TempSrc:      SpO2: 91% (!) 88%  (!) 85%  Weight:      Height:       General exam: Alert, awake, oriented x 3; afebrile, still reporting intermittent episodes of coughing spells (productive), short winded sensation, expiratory wheezing and requiring high flow nasal cannula supplementation (11 L). Respiratory system: Diffuse rhonchi bilaterally; positive expiratory wheezing; no using accessory muscles.  Intermittent tachypnea appreciated. Cardiovascular system:RRR. No rubs or gallops. Gastrointestinal system: Abdomen is nondistended, soft and nontender. No organomegaly or masses felt. Normal bowel sounds heard. Central nervous system: Alert and oriented. No focal neurological deficits. Extremities: No cyanosis or clubbing. Skin: No petechiae.  Unchanged stage I pressure injury in her sacrum without signs of superimposed infection. Psychiatry: Judgement and insight appear normal. Mood & affect appropriate.   Data Reviewed: Lactic acid: peaked at 4.2> 3.1 CBC: WBCs 10.5, hemoglobin 8.8 and platelet count 135 K. Basic metabolic panel: Sodium 820, potassium 4.4, chloride 104, bicarb 28, CBGs 152, BUN 18, creatinine 0.58, calcium 10.8 and GFR > 60 Magnesium 1.7  Family Communication: Son at bedside  Disposition: Status is: Inpatient Remains inpatient appropriate because: Continue treatment for acute on chronic respiratory failure with hypoxia in the setting of pneumonitis/pneumonia and COPD exacerbation.   Planned Discharge Destination: Home with Home Health  Time spent: 50 minutes  Author: Barton Dubois, MD 01/06/2022 11:02 AM  For on call review [email or web address].

## 2022-01-07 ENCOUNTER — Encounter (HOSPITAL_COMMUNITY): Payer: Self-pay | Admitting: Internal Medicine

## 2022-01-07 DIAGNOSIS — J690 Pneumonitis due to inhalation of food and vomit: Secondary | ICD-10-CM | POA: Diagnosis not present

## 2022-01-07 DIAGNOSIS — Z515 Encounter for palliative care: Secondary | ICD-10-CM

## 2022-01-07 DIAGNOSIS — K219 Gastro-esophageal reflux disease without esophagitis: Secondary | ICD-10-CM | POA: Diagnosis not present

## 2022-01-07 DIAGNOSIS — J189 Pneumonia, unspecified organism: Secondary | ICD-10-CM | POA: Diagnosis not present

## 2022-01-07 DIAGNOSIS — J9621 Acute and chronic respiratory failure with hypoxia: Secondary | ICD-10-CM | POA: Diagnosis not present

## 2022-01-07 DIAGNOSIS — Z7189 Other specified counseling: Secondary | ICD-10-CM

## 2022-01-07 LAB — CBC
HCT: 27.6 % — ABNORMAL LOW (ref 36.0–46.0)
Hemoglobin: 8.6 g/dL — ABNORMAL LOW (ref 12.0–15.0)
MCH: 27.6 pg (ref 26.0–34.0)
MCHC: 31.2 g/dL (ref 30.0–36.0)
MCV: 88.5 fL (ref 80.0–100.0)
Platelets: 112 10*3/uL — ABNORMAL LOW (ref 150–400)
RBC: 3.12 MIL/uL — ABNORMAL LOW (ref 3.87–5.11)
RDW: 25.5 % — ABNORMAL HIGH (ref 11.5–15.5)
WBC: 9.1 10*3/uL (ref 4.0–10.5)
nRBC: 0 % (ref 0.0–0.2)

## 2022-01-07 LAB — GLUCOSE, CAPILLARY
Glucose-Capillary: 153 mg/dL — ABNORMAL HIGH (ref 70–99)
Glucose-Capillary: 233 mg/dL — ABNORMAL HIGH (ref 70–99)
Glucose-Capillary: 257 mg/dL — ABNORMAL HIGH (ref 70–99)
Glucose-Capillary: 293 mg/dL — ABNORMAL HIGH (ref 70–99)
Glucose-Capillary: 297 mg/dL — ABNORMAL HIGH (ref 70–99)
Glucose-Capillary: 400 mg/dL — ABNORMAL HIGH (ref 70–99)

## 2022-01-07 MED ORDER — INSULIN ASPART 100 UNIT/ML IJ SOLN
3.0000 [IU] | Freq: Three times a day (TID) | INTRAMUSCULAR | Status: DC
  Administered 2022-01-07 – 2022-01-08 (×3): 3 [IU] via SUBCUTANEOUS

## 2022-01-07 MED ORDER — INSULIN GLARGINE-YFGN 100 UNIT/ML ~~LOC~~ SOLN
18.0000 [IU] | Freq: Every day | SUBCUTANEOUS | Status: DC
  Administered 2022-01-08: 18 [IU] via SUBCUTANEOUS
  Filled 2022-01-07 (×2): qty 0.18

## 2022-01-07 NOTE — TOC Initial Note (Signed)
Transition of Care [facility]) - Initial/Assessment Note    Patient Details  Name: Michaela Moon MRN: [id number] Date of Birth: 1935/07/01  Transition of Care [facility]) CM/SW Contact:    Shade Flood, LCSW Phone Number: 01/07/2022, 11:56 AM  Clinical Narrative:                  Pt admitted from home. She has a high readmission risk score. Met with pt and her son at bedside to assess. Pt and her son reside together. Pt is active with [facility] for RN and PT. Pt has a walker, BSC, and O2 for DME at home. [name] takes her to appointments and they state that they do not have any difficulty obtaining pt's medications as needed.  Pt anticipating she will return home with resumption of HH at dc. TOC will follow and assist as needed.  Expected Discharge Plan: [place] Barriers to Discharge: Continued Medical Work up   Patient Goals and CMS Choice Patient states their goals for this hospitalization and ongoing recovery are:: go home   Choice offered to / list presented to : Patient  Expected Discharge Plan and Services Expected Discharge Plan: [place] In-house Referral: Clinical Social Work   Post Acute Care Choice: Resumption of Svcs/PTA Provider Living arrangements for the past 2 months: Single Family Home                                      Prior Living Arrangements/Services Living arrangements for the past 2 months: Single Family Home Lives with:: Adult Children Patient language and need for interpreter reviewed:: Yes Do you feel safe going back to the place where you live?: Yes      Need for Family Participation in Patient Care: Yes (Comment) Care giver support system in place?: Yes (comment) Current home services: DME, Home PT, Home RN Criminal Activity/Legal Involvement Pertinent to Current Situation/Hospitalization: No - Comment as needed  Activities of Daily Living Home Assistive Devices/Equipment: Bedside commode/3-in-1,  Oxygen ADL Screening (condition at time of admission) Patient's cognitive ability adequate to safely complete daily activities?: Yes Is the patient deaf or have difficulty hearing?: No Does the patient have difficulty seeing, even when wearing glasses/contacts?: No Does the patient have difficulty concentrating, remembering, or making decisions?: Yes Patient able to express need for assistance with ADLs?: No Does the patient have difficulty dressing or bathing?: No Independently performs ADLs?: No Communication: Independent Dressing (OT): Needs assistance Is this a change from baseline?: Pre-admission baseline Grooming: Needs assistance Is this a change from baseline?: Pre-admission baseline Feeding: Needs assistance Is this a change from baseline?: Pre-admission baseline Bathing: Needs assistance Is this a change from baseline?: Pre-admission baseline Toileting: Needs assistance Is this a change from baseline?: Pre-admission baseline In/Out Bed: Needs assistance Is this a change from baseline?: Pre-admission baseline Walks in Home: Needs assistance Is this a change from baseline?: Pre-admission baseline Does the patient have difficulty walking or climbing stairs?: Yes Weakness of Legs: Both Weakness of Arms/Hands: None  Permission Sought/Granted                  Emotional Assessment Appearance:: Appears stated age Attitude/Demeanor/Rapport: Engaged Affect (typically observed): Pleasant Orientation: : Oriented to Self, Oriented to Place, Oriented to  Time, Oriented to Situation Alcohol / Substance Use: Not Applicable Psych Involvement: No (comment)  Admission diagnosis:  Acute on  chronic respiratory failure with hypoxia (HCC) [J96.21] Patient Active Problem List   Diagnosis Date Noted   Chronic diastolic HF (heart failure) ([place]) 01/04/2022   Acute on chronic respiratory failure with hypoxia ([place]) 12/25/2021   Hypoglycemia 12/25/2021   Insulin dependent type 2  diabetes mellitus ([place]) 12/25/2021   Hypothermia 83/43/7357   Acute diastolic CHF (congestive heart failure) ([place]) 12/16/2021   Lactic acidosis 12/07/2021   Iron deficiency anemia 12/07/2021   Hypoalbuminemia due to protein-calorie malnutrition ([place]) 12/07/2021   Elevated brain natriuretic peptide (BNP) level 12/07/2021   Pressure injury of skin 11/29/2021   Acute respiratory failure with hypoxemia (HCC)    Recurrent pneumonia    Multifocal pneumonia 11/28/2021   Acute respiratory failure with hypoxia ([place]) 11/28/2021   Lung nodule 11/28/2021   AKI (acute kidney injury) ([place]) 11/28/2021   Carpal tunnel syndrome of right wrist    S/P carpal tunnel release left 10/23/17 right 11/20/17    Gastritis and gastroduodenitis 08/16/2016   Erosive gastritis    GERD (gastroesophageal reflux disease) 04/15/2016   Dysphagia 04/15/2016   Acute exacerbation of chronic obstructive pulmonary disease (COPD) ([place])    Pneumonia 01/29/2013   Hypercalcemia 12/24/2012   Pain in joint, ankle and foot 11/27/2012   Peripheral neuropathy 11/27/2012   Essential hypertension    Mixed hyperlipidemia    Diabetes mellitus without complication ([place])    Arthritis    Obesity    Osteopenia    PCP:  [name], MD Pharmacy:   [place], [place] [address] Phone: [phone] Fax: [phone]     Social Determinants of Health (SDOH) Interventions    Readmission Risk Interventions    01/07/2022   11:55 AM 12/31/2021    3:22 PM  Readmission Risk Prevention Plan  Transportation Screening Complete Complete  PCP or Specialist Appt within 5-7 Days  Complete  Home Care Screening  Complete  Medication Review (RN CM)  Complete  HRI or Home Care Consult Complete   Social Work Consult for [place] Planning/Counseling Complete   Palliative Care Screening Complete   Medication Review Press photographer) Complete

## 2022-01-07 NOTE — Consult Note (Signed)
                                                                                 Consultation Note Date: 01/07/2022   Patient Name: Michaela Moon  DOB: 03/02/1935  MRN: [id number]  Age / Sex: 86 y.o., female  PCP: Hasanaj, Xaje A, MD Referring Physician: Madera, Carlos, MD  Reason for Consultation: Establishing goals of care  HPI/Patient Profile: 86 y.o. 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   Clinical Assessment and Goals of Care: I have reviewed medical records including EPIC notes, labs and imaging, received report from RN, assessed the patient.  Michaela Moon is sitting up in the Geri chair in her room.  She greets me, making and mostly keeping eye contact.  She appears chronically ill and somewhat frail.  She is alert and oriented x3, able to make her basic needs known.  Her son, Roy, is sleeping soundly on the couch in her room and does not wake during our conversation.  We meet at bedside to discuss diagnosis prognosis, GOC, EOL wishes, disposition and options.  I reminded Michaela Moon that she met with my coworker in October of this year.  She states that she remembers.  I introduced Palliative Medicine as specialized medical care for people living with serious illness. It focuses on providing relief from the symptoms and stress of a serious illness. The goal is to improve quality of life for both the patient and the family.  We discussed a brief life review of the patient.  Michaela Moon states that she lives in her own home.  She shares that her son Roy lives with her.  He does not work.  She shares that she had 2 other sons but they are both  deceased.  We then focused on their current illness.  Michaela Moon tells me that she has been hospitalized several times in the last few months.  She shares that she has had pneumonia 3 times.  We talk about the treatment plan.  At this point Mrs. Dolman tells me that she would prefer to return to her own home with her son helps with her care.  We talk about time for outcomes.  The natural disease trajectory and expectations at EOL were discussed.  Advanced directives, concepts specific to code status, artifical feeding and hydration, and rehospitalization were considered and discussed.  Michaela Moon endorses DNR.  She shares that her son Roy is in agreement.  Discussed the importance of continued conversation with family and the medical providers regarding overall plan of care and treatment options, ensuring decisions are within the context of the patient's values and GOCs.  Questions and concerns were addressed.  The patient was encouraged to call with questions or concerns.  PMT will continue to support holistically.  Conference with attending, bedside nursing staff, transition of care team related to patient condition,   needs, goals of care, disposition.   HCPOA   NEXT OF KIN -[name] names her only surviving child, son Earnie Larsson did, as her healthcare surrogate.    SUMMARY OF RECOMMENDATIONS   At this point continue to treat the treatable but no CPR or intubation Home with home health which is already in place At this point would rehospitalize.   Code Status/Advance Care Planning: DNR -  endorses DNR, states son agrees.   Symptom Management:  Per hospitalist, no additional needs at this time.   Palliative Prophylaxis:  Frequent Pain Assessment and Oral Care  Additional Recommendations (Limitations, Scope, Preferences): No CPR or intubation   Psycho-social/Spiritual:  Desire for further Chaplaincy support:no Additional Recommendations: Caregiving  Support/Resources and Education  on Hospice  Prognosis:  Unable to determine, 6 months or less would not be surprising based on chronic illness burden, 4 hospital admits in the last 6 months, decreasing functional status, advanced age.  Discharge Planning: Home with home health services already in place.     Primary Diagnoses: Present on Admission:  Acute on chronic respiratory failure with hypoxia (HCC)  Mixed hyperlipidemia  Essential hypertension  GERD (gastroesophageal reflux disease)  Lactic acidosis  Pressure injury of skin   I have reviewed the medical record, interviewed the patient and family, and examined the patient. The following aspects are pertinent.  Past Medical History:  Diagnosis Date   Arthritis    Asthma    COPD (chronic obstructive pulmonary disease) ([place])    Diabetes mellitus without complication (HCC)    GERD (gastroesophageal reflux disease)    Headache    Hyperlipidemia    Hypertension    Macular degeneration    Neuropathy    Obesity    Osteopenia    Stress incontinence    Social History   Socioeconomic History   Marital status: Single    Spouse name: Not on file   Number of children: Not on file   Years of education: Not on file   Highest education level: Not on file  Occupational History   Not on file  Tobacco Use   Smoking status: Former    Packs/day: 2.00    Years: 20.00    Total pack years: 40.00    Types: Cigarettes    Quit date: 02/25/2002    Years since quitting: 19.8   Smokeless tobacco: Never  Vaping Use   Vaping Use: Never used  Substance and Sexual Activity   Alcohol use: No   Drug use: No   Sexual activity: Not Currently    Birth control/protection: Post-menopausal  Other Topics Concern   Not on file  Social History Narrative   Not on file   Social Determinants of Health   Financial Resource Strain: Not on file  Food Insecurity: No Food Insecurity (01/04/2022)   Hunger Vital Sign    Worried About Running Out of Food in the Last Year: Never  true    Ran Out of Food in the Last Year: Never true  Transportation Needs: No Transportation Needs (01/04/2022)   PRAPARE - Hydrologist (Medical): No    Lack of Transportation (Non-Medical): No  Physical Activity: Not on file  Stress: Not on file  Social Connections: Not on file   Family History  Problem Relation Age of Onset   Melanoma Brother    Colon cancer Neg Hx    Scheduled Meds:  albuterol  2.5 mg Nebulization Q6H   amoxicillin-clavulanate  1 tablet Oral Q12H  apixaban  5 mg Oral BID   arformoterol  15 mcg Nebulization BID   aspirin EC  81 mg Oral Q breakfast   atorvastatin  40 mg Oral Daily   budesonide (PULMICORT) nebulizer solution  0.5 mg Nebulization BID   Chlorhexidine Gluconate Cloth  6 each Topical Daily   dextromethorphan-guaiFENesin  1 tablet Oral BID   docusate sodium  100 mg Oral Daily   ferrous sulfate  325 mg Oral Daily   fluconazole  200 mg Oral Daily   fluticasone  1 spray Each Nare Daily   gabapentin  100 mg Oral TID   losartan  100 mg Oral Daily   And   hydrochlorothiazide  12.5 mg Oral Daily   insulin aspart  0-5 Units Subcutaneous QHS   insulin aspart  0-9 Units Subcutaneous TID WC   insulin glargine-yfgn  16 Units Subcutaneous Daily   methylPREDNISolone (SOLU-MEDROL) injection  40 mg Intravenous Q12H   metoprolol tartrate  50 mg Oral BID   montelukast  10 mg Oral QHS   mouth rinse  15 mL Mouth Rinse 4 times per day   pantoprazole  40 mg Oral BID   polyvinyl alcohol  1 drop Both Eyes QID   revefenacin  175 mcg Nebulization Daily   Continuous Infusions: PRN Meds:.acetaminophen **OR** acetaminophen, albuterol, ondansetron **OR** ondansetron (ZOFRAN) IV, mouth rinse, polyethylene glycol Medications Prior to Admission:  Prior to Admission medications   Medication Sig Start Date End Date Taking? Authorizing Provider  albuterol (PROVENTIL) (2.5 MG/3ML) 0.083% nebulizer solution Take 3 mLs (2.5 mg total) by  nebulization every 6 (six) hours as needed for wheezing or shortness of breath. 02/14/21  Yes Chesley Mires, MD  albuterol-ipratropium (COMBIVENT) 18-103 MCG/ACT inhaler Inhale 2 puffs into the lungs every 6 (six) hours as needed for wheezing. 11/27/12  Yes Vernie Shanks, MD  apixaban (ELIQUIS) 5 MG TABS tablet Take 1 tablet (5 mg total) by mouth 2 (two) times daily. 12/20/21  Yes Johnson, Clanford L, MD  aspirin 81 MG tablet Take 1 tablet (81 mg total) by mouth daily with breakfast. 01/01/22  Yes Emokpae, Courage, MD  atorvastatin (LIPITOR) 80 MG tablet ALTERNATE TAKING 1 TABLET EVERY OTHER DAY WITH 1/2 TABLET EVERY OTHERDAY 01/13/14  Yes Chipper Herb, MD  Biotin w/ Vitamins C & E (HAIR/SKIN/NAILS PO) Take 1 tablet by mouth daily. Takes once a day.   Yes [provider]  Budeson-Glycopyrrol-Formoterol (BREZTRI AEROSPHERE) 160-9-4.8 MCG/ACT AERO Inhale 2 puffs into the lungs in the morning and at bedtime. 03/08/21  Yes Chesley Mires, MD  docusate sodium (COLACE) 100 MG capsule Take 1 capsule (100 mg total) by mouth daily. 12/04/21 12/04/22 Yes Shah, Pratik D, DO  ferrous sulfate 325 (65 FE) MG tablet Take 1 tablet (325 mg total) by mouth daily. 12/04/21 12/04/22 Yes Shah, Pratik D, DO  fluconazole (DIFLUCAN) 200 MG tablet Take 1 tablet (200 mg total) by mouth daily. 01/02/22  Yes Emokpae, Courage, MD  fluticasone (FLONASE) 50 MCG/ACT nasal spray Place 1 spray into both nostrils daily. 10/19/20  Yes Chesley Mires, MD  gabapentin (NEURONTIN) 100 MG capsule Take 1 capsule (100 mg total) by mouth 3 (three) times daily. 12/20/21  Yes Johnson, Clanford L, MD  glimepiride (AMARYL) 2 MG tablet Take 4 mg by mouth daily with breakfast.    Yes [provider]  guaiFENesin (MUCINEX) 600 MG 12 hr tablet Take 2 tablets (1,200 mg total) by mouth 2 (two) times daily as needed for cough or to  loosen phlegm. 01/01/22  Yes Emokpae, Courage, MD  Hypromellose (ARTIFICIAL TEARS OP) Place 1 drop into both eyes 4  (four) times daily.   Yes [provider]  LANTUS SOLOSTAR 100 UNIT/ML Solostar Pen Inject 20 Units into the skin in the morning and at bedtime. 11/22/20  Yes [provider]  losartan-hydrochlorothiazide (HYZAAR) 100-12.5 MG per tablet TAKE ONE (1) TABLET EACH DAY Patient taking differently: Take 1 tablet by mouth daily. 03/09/14  Yes Chipper Herb, MD  metoprolol (LOPRESSOR) 50 MG tablet Take 50 mg by mouth 2 (two) times daily.   Yes [provider]  montelukast (SINGULAIR) 10 MG tablet TAKE ONE TABLET BY MOUTH AT BEDTIME 07/30/21  Yes Chesley Mires, MD  Multiple Vitamins-Minerals (CENTRUM SILVER PO) Take 1 tablet by mouth daily.   Yes [provider]  Omega-3 Fatty Acids (FISH OIL) 1000 MG CAPS Take 1,000 mg by mouth in the morning, at noon, and at bedtime.   Yes [provider]  ondansetron (ZOFRAN) 4 MG tablet Take 1 tablet (4 mg total) by mouth every 6 (six) hours as needed for nausea. 01/01/22  Yes Emokpae, Courage, MD  oxybutynin (DITROPAN) 5 MG tablet Take 5 mg by mouth every 8 (eight) hours as needed for bladder spasms.    Yes [provider]  pantoprazole (PROTONIX) 40 MG tablet Take 1 tablet (40 mg total) by mouth daily. 01/01/22  Yes Emokpae, Courage, MD  polyethylene glycol (MIRALAX / GLYCOLAX) 17 g packet Take 17 g by mouth daily as needed for mild constipation. 12/20/21  Yes Johnson, Clanford L, MD  predniSONE (DELTASONE) 10 MG tablet Take 1 tablet (10 mg total) by mouth daily with breakfast. Take 40 mg (4 tab) daily for 3 days , then 30 mg (3 Tab) daily for 3 days, then Take 20 mg (2 Tab) daily for 3 days, then Take 10 mg (10 mg) daily for 3 days... Then STOP 01/01/22  Yes Emokpae, Courage, MD  sitaGLIPtin-metformin (JANUMET) 50-1000 MG tablet Take 1 tablet by mouth 2 (two) times daily with a meal.   Yes [provider]  triamcinolone cream (KENALOG) 0.1 % Apply 1 application topically daily as needed (eczema).   Yes [provider]   Allergies  Allergen Reactions   Onion     wheezing   Review of Systems  Unable to perform ROS: Age    Physical Exam Vitals and nursing note reviewed.  Constitutional:      General: She is not in acute distress.    Appearance: She is ill-appearing.  HENT:     Head: Normocephalic and atraumatic.     Mouth/Throat:     Mouth: Mucous membranes are moist.  Cardiovascular:     Rate and Rhythm: Normal rate.  Pulmonary:     Effort: Pulmonary effort is normal. No respiratory distress.  Skin:    General: Skin is warm and dry.  Neurological:     Mental Status: She is alert and oriented to person, place, and time.  Psychiatric:        Mood and Affect: Mood normal.        Behavior: Behavior normal.     Vital Signs: BP (!) 124/41   Pulse 77   Temp 98.7 F (37.1 C) (Oral)   Resp 18   Ht 5' 5" (1.651 m)   Wt 80.8 kg   SpO2 92%   BMI 29.64 kg/m  Pain Scale: 0-10 POSS *See Group Information*: 1-Acceptable,Awake and alert Pain Score: 0-No  pain   SpO2: SpO2: 92 % O2 Device:SpO2: 92 % O2 Flow Rate: .O2 Flow Rate (L/min): 9 L/min  IO: Intake/output summary:  Intake/Output Summary (Last 24 hours) at 01/07/2022 1156 Last data filed at 01/07/2022 0630 Gross per 24 hour  Intake --  Output 1050 ml  Net -1050 ml    LBM: Last BM Date : 01/06/22 Baseline Weight: Weight: 79.4 kg Most recent weight: Weight: 80.8 kg     Palliative Assessment/Data:   Flowsheet Rows    Flowsheet Row Most Recent Value  Intake Tab   Referral Department Hospitalist  Unit at Time of Referral Intermediate Care Unit  Palliative Care Primary Diagnosis Pulmonary  Date Notified 01/04/22  Palliative Care Type Return patient Palliative Care  Reason for referral Clarify Goals of Care  Date of Admission 01/04/22  Date first seen by Palliative Care 01/07/22  # of days Palliative referral response time 3 Day(s)  # of days IP prior to Palliative referral 0  Clinical Assessment    Palliative Performance Scale Score 30%  Pain Max last 24 hours Not able to report  Pain Min Last 24 hours Not able to report  Dyspnea Max Last 24 Hours Not able to report  Dyspnea Min Last 24 hours Not able to report  Psychosocial & Spiritual Assessment   Palliative Care Outcomes        Time In: 0800  Time Out: 0915 Time Total: 75 minutes  Greater than 50%  of this time was spent counseling and coordinating care related to the above assessment and plan.  Signed by: Tasha A Dove, NP   Please contact Palliative Medicine Team phone at [phone] for questions and concerns.  For individual provider: See Amion 

## 2022-01-07 NOTE — Progress Notes (Signed)
Progress Note   Patient: Michaela Moon OFB:[id number] DOB: Oct 18, 1935 DOA: 01/04/2022     3 DOS: the patient was seen and examined on 01/07/2022   Brief hospital course:  Michaela Moon is a 86 y.o. female with medical history significant of COPD, diabetes, hypertension, lower extremity DVT (on chronic Eliquis), diagnosis of COVID in September 2023 and recent hospitalization for aspiration pneumonia with subsequent decline in her health status.  Patient chronically on 5 L nasal cannula supplementation due to ongoing hypoxia.  Actively still with discharge prescriptions for steroids tapering and oral antibiotics.  Who presented to the emergency department secondary to worsening respiratory failure and hypoxia into the mid 80s despite the use of 10 L of oxygen supplementation.  Patient reports that after discharge (01/02/2022) she was doing okay until early this morning when she woke up having worsening respiratory distress.  Patient found with increased tachypnea and poor air movement.  Patient expressed positive productive coughing spells and general malaise.  There has not been any fever, chills, sick contacts, reported nausea, vomiting, dysuria, hematuria, melena, hematochezia or focal weaknesses.   In the ED CT angiogram demonstrated no pulmonary embolism and positive extensive bilateral heterogeneous and consolidative airspace opacities consistent with multifocal pneumonia/aspiration.  *Patient with extensive hospitalization and clinical decline after COVID infection in September; still within 90 days from initial infection.  Has presented positive COVID PCR; no change in management and most likely no contributing as an acute infection currently.  Assessment and Plan: * Acute on chronic respiratory failure with hypoxia (HCC) -Patient uses 5 L nasal cannula supplementation at baseline -Currently requiring high flow nasal cannula supplementation to maintain adequate saturation. -Presentation most  likely associated with pneumonitis process on top of recently aspiration pneumonia. -MRSA PCR negative -Currently afebrile and overall improving. -Will continue treatment for COPD exacerbation; continue augmentin and fluconazole. -Requiring 8-9 L high flow nasal cannula currently. -Continue to wean oxygen supplementation as tolerated -Follow clinical response.  Slowly improving. -Given inability to change high risk aspiration component patient remains at high risk for decompensation with overall poor long-term prognosis. -Case discussed with son and patient at bedside; also discussion by palliative care.  Patient remains DNR/DNI.  Continue treating condition.  Appreciative and open for potential discharge home with hospice.  Dysphagia -With a high risk for aspiration -Continue dysphagia 2 diet with nectar thick liquids. -Education provided for upright position during meals and after meals to continue minimizing chance of aspiration. -[name] reports having hospital bed for her at home.  Chronic diastolic HF (heart failure) (HCC) -No vascular congestion appreciated on x-ray or CT scan -Continue to follow daily weights and strict I's and O's -Continue home medication regimen. -Maintain adequate hydration. -Patient chronically on HCTZ. -Continue to follow urine output.  Insulin dependent type 2 diabetes mellitus (HCC) -Mild hypoglycemic event at time of admission in the setting of poor oral intake. -Patient will be receiving steroids which will help with low sugars prevention. -continue SSI and levemir -Recent A1c 6.3 -Follow CBGs fluctuation.  Lactic acidosis -In the setting of respiratory distress and multiple use of albuterol prior to admission-most likely. -Latic acid down to 3.1 -Patient overall improving -Advised to maintain adequate oral hydration -No signs of sepsis or hemodynamic unstability currently.   Pressure injury of skin -Present at time of admission -No  signs of superimposed infection -Provide preventive measures and constant repositioning.  GERD (gastroesophageal reflux disease) -Continue PPI -Dose adjusted to twice a day while receiving steroids for further GI  prophylaxis.  Mixed hyperlipidemia -Continue statin  Essential hypertension -Appears to be a stable overall -continue home antihypertensive agents -follow-up vital signs.   Subjective:  No fever, no chest pain, no nausea, no vomiting.  Slowly improving and feeling better.  Requiring 8-9 L high flow nasal cannula supplementation currently.  Physical Exam: Vitals:   01/07/22 1500 01/07/22 1513 01/07/22 1600 01/07/22 1700  BP: 127/79  (!) 127/46 (!) 144/63  Pulse: (!) 107 82 67 74  Resp: (!) 27 (!) '23 15 18  '$ Temp:    98.6 F (37 C)  TempSrc:    Oral  SpO2: 91% 95% 93% 90%  Weight:      Height:       General exam: Alert, awake, oriented x 3; reported intermittent episode of coughing spells; no nausea, no vomiting, breathing easier.  Requiring less oxygen supplementation. Respiratory system: High flow nasal cannula down to 8-9 L; reporting breathing to be easier.  No using accessory muscles.  Positive diffuse rhonchi bilaterally; mild expiratory wheezing.  No crackles auscultated. Cardiovascular system:RRR. No rubs or gallops; no JVD. Gastrointestinal system: Abdomen is nondistended, soft and nontender. No organomegaly or masses felt. Normal bowel sounds heard. Central nervous system: Alert and oriented. No focal neurological deficits. Extremities: No cyanosis, no clubbing.  Trace edema appreciated bilaterally. Skin: No petechiae; unchanged stage I pressure injury in her sacrum; no signs of superimposed infection appreciated. Psychiatry: Judgement and insight appear normal. Mood & affect appropriate.   Data Reviewed: Lactic acid: peaked at 4.2> 3.1 CBC: WBCs 10.5, hemoglobin 8.8 and platelet count 135 K. Basic metabolic panel: Sodium 025, potassium 4.4, chloride 104,  bicarb 28, CBGs 152, BUN 18, creatinine 0.58, calcium 10.8 and GFR > 60 Magnesium 1.7 CBGs: 257>> 293>> 297  Family Communication: Son at bedside  Disposition: Status is: Inpatient Remains inpatient appropriate because: Continue treatment for acute on chronic respiratory failure with hypoxia in the setting of pneumonitis/pneumonia and COPD exacerbation.   Planned Discharge Destination: Home with Home Health  Time spent: 50 minutes  Author: Barton Dubois, MD 01/07/2022 5:59 PM  For on call review [email or web address].

## 2022-01-07 NOTE — Progress Notes (Signed)
Unsure if COVID (+) result is from a new diagnosis or is from the virus shedding from her old COVID diagnosis back on 10/31/21 at [facility]. MD and infection prevention made aware. MD stated treatment would not change due to the diagnosis unless patient status gets worse. Family requested a re-swab to see if it was a false positive or if it would come back positive again and request was denied.   Patient placed on Airborne and Contact precautions just in case the virus is from a new diagnosis to protect staff, family and the patient.

## 2022-01-07 NOTE — Progress Notes (Signed)
Patient has been using her flutter.  Flutter is at bedside where patient can use.  Patient was going to drink her beverage and then do her flutter as I was leaving room.

## 2022-01-07 NOTE — Inpatient Diabetes Management (Signed)
Inpatient Diabetes Program Recommendations  AACE/ADA: New Consensus Statement on Inpatient Glycemic Control   Target Ranges:  Prepandial:   less than 140 mg/dL      Peak postprandial:   less than 180 mg/dL (1-2 hours)      Critically ill patients:  140 - 180 mg/dL    Latest Reference Range & Units 01/07/22 03:58 01/07/22 07:34  Glucose-Capillary 70 - 99 mg/dL 233 (H) 257 (H)    Latest Reference Range & Units 01/06/22 06:51 01/06/22 11:45 01/06/22 16:54 01/06/22 20:51 01/06/22 20:58  Glucose-Capillary 70 - 99 mg/dL 265 (H) 248 (H) 265 (H) 408 (H) 413 (H)   Review of Glycemic Control  Diabetes history: DM2 Outpatient Diabetes medications: Lantus 20 units BID, Janumet 50-1000 mg BID, Amaryl 4 mg daily Current orders for Inpatient glycemic control: Semglee 15 units daily, Novolog 0-9 units TID with meals, Novolog 0-5 units QHS; Solumedrol 40 mg Q12H   Inpatient Diabetes Program Recommendations:     Insulin:  If steroids are continued as ordered, please consider ordering Novolog 5 units TID with meals for meal coverage if patient eats at least 50% of meals.   Outpatient: Initially hypoglycemic on admission. At time of discharge, consider decreasing outpatient Lantus to 15 units daily and decreasing or discontinuing Amaryl.  Thanks, Barnie Alderman, RN, MSN, [place] Diabetes Coordinator Inpatient Diabetes Program [phone] (Team Pager from 8am to [place])

## 2022-01-08 DIAGNOSIS — J690 Pneumonitis due to inhalation of food and vomit: Secondary | ICD-10-CM | POA: Diagnosis not present

## 2022-01-08 DIAGNOSIS — J189 Pneumonia, unspecified organism: Secondary | ICD-10-CM | POA: Diagnosis not present

## 2022-01-08 DIAGNOSIS — J9621 Acute and chronic respiratory failure with hypoxia: Secondary | ICD-10-CM | POA: Diagnosis not present

## 2022-01-08 DIAGNOSIS — K219 Gastro-esophageal reflux disease without esophagitis: Secondary | ICD-10-CM | POA: Diagnosis not present

## 2022-01-08 LAB — GLUCOSE, CAPILLARY
Glucose-Capillary: 228 mg/dL — ABNORMAL HIGH (ref 70–99)
Glucose-Capillary: 179 mg/dL — ABNORMAL HIGH (ref 70–99)
Glucose-Capillary: 219 mg/dL — ABNORMAL HIGH (ref 70–99)
Glucose-Capillary: 343 mg/dL — ABNORMAL HIGH (ref 70–99)

## 2022-01-08 MED ORDER — INSULIN GLARGINE-YFGN 100 UNIT/ML ~~LOC~~ SOLN
20.0000 [IU] | Freq: Every day | SUBCUTANEOUS | Status: DC
  Administered 2022-01-09 – 2022-01-12 (×4): 20 [IU] via SUBCUTANEOUS
  Filled 2022-01-08 (×6): qty 0.2

## 2022-01-08 MED ORDER — INSULIN ASPART 100 UNIT/ML IJ SOLN
5.0000 [IU] | Freq: Three times a day (TID) | INTRAMUSCULAR | Status: DC
  Administered 2022-01-08 – 2022-01-12 (×13): 5 [IU] via SUBCUTANEOUS

## 2022-01-08 NOTE — Inpatient Diabetes Management (Signed)
Inpatient Diabetes Program Recommendations  AACE/ADA: New Consensus Statement on Inpatient Glycemic Control   Target Ranges:  Prepandial:   less than 140 mg/dL      Peak postprandial:   less than 180 mg/dL (1-2 hours)      Critically ill patients:  140 - 180 mg/dL    Latest Reference Range & Units 01/07/22 07:34 01/07/22 11:28 01/07/22 16:06 01/07/22 22:35 01/08/22 07:53  Glucose-Capillary 70 - 99 mg/dL 257 (H) 293 (H) 297 (H) 400 (H) 228 (H)   Review of Glycemic Control  Diabetes history: DM2 Outpatient Diabetes medications: Lantus 20 units BID, Janumet 50-1000 mg BID, Amaryl 4 mg daily Current orders for Inpatient glycemic control: Semglee 18 units daily, Novolog 0-9 units TID with meals, Novolog 0-5 units QHS, Novolog 3 units TID with meals; Solumedrol 40 mg Q12H   Inpatient Diabetes Program Recommendations:     Insulin:  If steroids are continued as ordered, please consider increasing Semglee to 20 units daily and meal coverage to Novolog 6 units TID with meals if patient eats at least 50% of meals.    Outpatient: Initially hypoglycemic on admission. At time of discharge, consider decreasing outpatient Lantus to 15 units daily and decreasing or discontinuing Amaryl.   Thanks, Barnie Alderman, RN, MSN, [place] Diabetes Coordinator Inpatient Diabetes Program [phone] (Team Pager from 8am to [place])

## 2022-01-08 NOTE — Progress Notes (Signed)
Patient transferred from ICU in stable condition , her BP is a little elevated I notified Dr. Dyann Kief. Patient denies pain or discomfort at this time.    01/08/22 1413  Vitals  Temp 97.8 F (36.6 C)  Temp Source Oral  BP (!) 169/57  MAP (mmHg) 90  Pulse Rate 66  Resp (!) 22  MEWS COLOR  MEWS Score Color Green  Oxygen Therapy  SpO2 98 %  O2 Device Nasal Cannula  O2 Flow Rate (L/min) 6 L/min  MEWS Score  MEWS Temp 0  MEWS Systolic 0  MEWS Pulse 0  MEWS RR 1  MEWS LOC 0  MEWS Score 1

## 2022-01-08 NOTE — Progress Notes (Signed)
Progress Note   Patient: Michaela Moon IEP:[id number] DOB: Dec 26, 1935 DOA: 01/04/2022     4 DOS: the patient was seen and examined on 01/08/2022   Brief hospital course:  Michaela Moon is a 86 y.o. female with medical history significant of COPD, diabetes, hypertension, lower extremity DVT (on chronic Eliquis), diagnosis of COVID in September 2023 and recent hospitalization for aspiration pneumonia with subsequent decline in her health status.  Patient chronically on 5 L nasal cannula supplementation due to ongoing hypoxia.  Actively still with discharge prescriptions for steroids tapering and oral antibiotics.  Who presented to the emergency department secondary to worsening respiratory failure and hypoxia into the mid 80s despite the use of 10 L of oxygen supplementation.  Patient reports that after discharge (01/02/2022) she was doing okay until early this morning when she woke up having worsening respiratory distress.  Patient found with increased tachypnea and poor air movement.  Patient expressed positive productive coughing spells and general malaise.  There has not been any fever, chills, sick contacts, reported nausea, vomiting, dysuria, hematuria, melena, hematochezia or focal weaknesses.   In the ED CT angiogram demonstrated no pulmonary embolism and positive extensive bilateral heterogeneous and consolidative airspace opacities consistent with multifocal pneumonia/aspiration.  *Patient with extensive hospitalization and clinical decline after COVID infection in September; still within 90 days from initial infection.  Has presented positive COVID PCR; no change in management and most likely no contributing as an acute infection currently.  Assessment and Plan: * Acute on chronic respiratory failure with hypoxia (HCC) -Patient uses 5 L nasal cannula supplementation at baseline -Currently requiring high flow nasal cannula supplementation to maintain adequate saturation. -Presentation most  likely associated with pneumonitis process on top of recently aspiration pneumonia. -MRSA PCR negative -Currently afebrile and overall improving. -Will continue treatment for COPD exacerbation; continue augmentin and fluconazole. -Requiring 7 L high flow nasal cannula currently. -Continue to wean oxygen supplementation as tolerated; pending for discharge when oxygen supplementation around 6 L. -Follow clinical response.  Slowly improving. -Given inability to change high risk aspiration component patient remains at high risk for decompensation with overall poor long-term prognosis. -Case discussed with son and patient at bedside; also discussion by palliative care.  Patient remains DNR/DNI.  Continue treating condition.  Appreciative and open for potential discharge home with hospice.  Dysphagia -With a high risk for aspiration -Continue dysphagia 2 diet with nectar thick liquids. -Education provided for upright position during meals and after meals to continue minimizing chance of aspiration. -[name] reports having hospital bed for her at home.  Chronic diastolic HF (heart failure) (HCC) -No vascular congestion appreciated on x-ray or CT scan -Continue to follow daily weights and strict I's and O's -Continue home medication regimen. -Maintain adequate hydration. -Patient chronically on HCTZ. -Continue to follow urine output.  Insulin dependent type 2 diabetes mellitus (HCC) -Mild hypoglycemic event at time of admission in the setting of poor oral intake. -Patient will be receiving steroids which will help with low sugars prevention. -continue SSI and levemir -Recent A1c 6.3 -Follow CBGs fluctuation.  Lactic acidosis -In the setting of respiratory distress and multiple use of albuterol prior to admission-most likely. -Latic acid down to 3.1 -Patient overall improving -Advised to maintain adequate oral hydration -No signs of sepsis or hemodynamic unstability  currently.   Pressure injury of skin -Present at time of admission -No signs of superimposed infection -Provide preventive measures and constant repositioning.  GERD (gastroesophageal reflux disease) -Continue PPI -Dose adjusted to  twice a day while receiving steroids for further GI prophylaxis.  Mixed hyperlipidemia -Continue statin  Essential hypertension -Appears to be a stable overall -continue home antihypertensive agents -follow-up vital signs.   Subjective:  No fever, no chest pain, no nausea, no vomiting.  Patient continues slowly to improve.  Currently requiring around 7 L high flow nasal cannula supplementation.  Continues to be short winded with activity and experiencing productive coughing spells.  Physical Exam: Vitals:   01/08/22 0600 01/08/22 0740 01/08/22 0755 01/08/22 1135  BP: (!) 149/48     Pulse: (!) 59  65   Resp: 19  16   Temp:   98.8 F (37.1 C) 98.5 F (36.9 C)  TempSrc:   Oral Oral  SpO2: 96% 96% 93%   Weight:      Height:       General exam: Alert, awake, oriented x 3; breathing continues to get easier; no chest pain, no nausea, no vomiting.  Still experiencing productive coughing spells and short winded sensation with activity. Respiratory system: Diffuse rhonchi bilaterally; no crackles; positive expiratory wheezing.  No using accessory muscles.  Wheezing Cardiovascular system:RRR. No rubs or gallops; no JVD. Gastrointestinal system: Abdomen is nondistended, soft and nontender. No organomegaly or masses felt. Normal bowel sounds heard. Central nervous system: Alert and oriented. No focal neurological deficits. Extremities: No cyanosis or clubbing.  Trace edema appreciated bilaterally. Skin: No petechiae. Psychiatry: Judgement and insight appear normal. Mood & affect appropriate.   Latest Data Reviewed: Lactic acid: peaked at 4.2> 3.1 CBC: WBCs 10.5, hemoglobin 8.8 and platelet count 135 K. Basic metabolic panel: Sodium 449, potassium 4.4,  chloride 104, bicarb 28, CBGs 152, BUN 18, creatinine 0.58, calcium 10.8 and GFR > 60 Magnesium 1.7 CBGs: 257>> 293>> 297  Family Communication: Son at bedside  Disposition: Status is: Inpatient Remains inpatient appropriate because: Continue treatment for acute on chronic respiratory failure with hypoxia in the setting of pneumonitis/pneumonia and COPD exacerbation.   Planned Discharge Destination: Home with Home Health  Time spent: 50 minutes  Author: Barton Dubois, MD 01/08/2022 12:22 PM  For on call review [email or web address].

## 2022-01-08 NOTE — Progress Notes (Signed)
Palliative: Chart review completed.  At this point [name] goals are set for "treat the treatable" but no extraordinary measures.  At this point, time for outcomes.  She has 24/7 help at home and would return to her own home.  PMT to continue to follow.  No charge Quinn Axe, NP Palliative medicine team Team phone [phone] Greater than 50% of this time was spent counseling and coordinating care related to the above assessment and plan.

## 2022-01-09 DIAGNOSIS — J690 Pneumonitis due to inhalation of food and vomit: Secondary | ICD-10-CM | POA: Diagnosis not present

## 2022-01-09 DIAGNOSIS — J189 Pneumonia, unspecified organism: Secondary | ICD-10-CM | POA: Diagnosis not present

## 2022-01-09 DIAGNOSIS — K219 Gastro-esophageal reflux disease without esophagitis: Secondary | ICD-10-CM | POA: Diagnosis not present

## 2022-01-09 DIAGNOSIS — J9621 Acute and chronic respiratory failure with hypoxia: Secondary | ICD-10-CM | POA: Diagnosis not present

## 2022-01-09 LAB — CULTURE, BLOOD (ROUTINE X 2)
Culture: NO GROWTH
Culture: NO GROWTH
Special Requests: ADEQUATE

## 2022-01-09 LAB — BASIC METABOLIC PANEL
Anion gap: 6 (ref 5–15)
BUN: 26 mg/dL — ABNORMAL HIGH (ref 8–23)
CO2: 30 mmol/L (ref 22–32)
Calcium: 10.8 mg/dL — ABNORMAL HIGH (ref 8.9–10.3)
Chloride: 101 mmol/L (ref 98–111)
Creatinine, Ser: 0.61 mg/dL (ref 0.44–1.00)
GFR, Estimated: >60 mL/min (ref >60)
Glucose, Bld: 219 mg/dL — ABNORMAL HIGH (ref 70–99)
Potassium: 4.7 mmol/L (ref 3.5–5.1)
Sodium: 137 mmol/L (ref 135–145)

## 2022-01-09 LAB — CBC
HCT: 31 % — ABNORMAL LOW (ref 36.0–46.0)
Hemoglobin: 9.6 g/dL — ABNORMAL LOW (ref 12.0–15.0)
MCH: 27.4 pg (ref 26.0–34.0)
MCHC: 31 g/dL (ref 30.0–36.0)
MCV: 88.3 fL (ref 80.0–100.0)
Platelets: 165 10*3/uL (ref 150–400)
RBC: 3.51 MIL/uL — ABNORMAL LOW (ref 3.87–5.11)
RDW: 23.7 % — ABNORMAL HIGH (ref 11.5–15.5)
WBC: 9.7 10*3/uL (ref 4.0–10.5)
nRBC: 0 % (ref 0.0–0.2)

## 2022-01-09 LAB — GLUCOSE, CAPILLARY
Glucose-Capillary: 254 mg/dL — ABNORMAL HIGH (ref 70–99)
Glucose-Capillary: 221 mg/dL — ABNORMAL HIGH (ref 70–99)
Glucose-Capillary: 192 mg/dL — ABNORMAL HIGH (ref 70–99)
Glucose-Capillary: 238 mg/dL — ABNORMAL HIGH (ref 70–99)

## 2022-01-09 NOTE — Progress Notes (Signed)
Patient has flutter at bedside for CPT.

## 2022-01-09 NOTE — Inpatient Diabetes Management (Signed)
Inpatient Diabetes Program Recommendations  AACE/ADA: New Consensus Statement on Inpatient Glycemic Control (2015)  Target Ranges:  Prepandial:   less than 140 mg/dL      Peak postprandial:   less than 180 mg/dL (1-2 hours)      Critically ill patients:  140 - 180 mg/dL    Latest Reference Range & Units 01/08/22 07:53 01/08/22 11:31 01/08/22 16:57 01/08/22 20:10 01/09/22 08:44  Glucose-Capillary 70 - 99 mg/dL 228 (H) 179 (H) 219 (H) 343 (H) 254 (H)   Review of Glycemic Control  Diabetes history: DM2 Outpatient Diabetes medications: Lantus 20 units BID, Janumet 50-1000 mg BID, Amaryl 4 mg daily Current orders for Inpatient glycemic control: Semglee 20 units daily, Novolog 0-9 units TID with meals, Novolog 0-5 units QHS, Novolog 5 units TID with meals; Solumedrol 40 mg Q12H   Inpatient Diabetes Program Recommendations:     Insulin:  If steroids are continued as ordered, please consider increasing Semglee to 22 units daily.   Outpatient: Initially hypoglycemic on admission. At time of discharge, consider decreasing outpatient Lantus to 15 units daily and decreasing or discontinuing Amaryl.   Thanks, Barnie Alderman, RN, MSN, [place] Coordinator Inpatient Diabetes Program [phone] (Team Pager from 8am to [place])

## 2022-01-09 NOTE — Progress Notes (Signed)
Progress Note   Patient: Michaela Moon GBT:[id number] DOB: 1935-10-06 DOA: 01/04/2022     5 DOS: the patient was seen and examined on 01/09/2022   Brief hospital course:  Michaela Moon is a 86 y.o. female with medical history significant of COPD, diabetes, hypertension, lower extremity DVT (on chronic Eliquis), diagnosis of COVID in September 2023 and recent hospitalization for aspiration pneumonia with subsequent decline in her health status.  Patient chronically on 5 L nasal cannula supplementation due to ongoing hypoxia.  Actively still with discharge prescriptions for steroids tapering and oral antibiotics.  Who presented to the emergency department secondary to worsening respiratory failure and hypoxia into the mid 80s despite the use of 10 L of oxygen supplementation.  Patient reports that after discharge (01/02/2022) she was doing okay until early this morning when she woke up having worsening respiratory distress.  Patient found with increased tachypnea and poor air movement.  Patient expressed positive productive coughing spells and general malaise.  There has not been any fever, chills, sick contacts, reported nausea, vomiting, dysuria, hematuria, melena, hematochezia or focal weaknesses.   In the ED CT angiogram demonstrated no pulmonary embolism and positive extensive bilateral heterogeneous and consolidative airspace opacities consistent with multifocal pneumonia/aspiration.  *Patient with extensive hospitalization and clinical decline after COVID infection in September; still within 90 days from initial infection.  Has presented positive COVID PCR; no change in management and most likely no contributing as an acute infection currently.  Assessment and Plan: * Acute on chronic respiratory failure with hypoxia -suspect recurrent aspiration episodes and COPD exacerbation -Patient was previously on 4 L via nasal cannula, after recent admission she was discharged on  5 L nasal cannula  supplementation at baseline -Currently requiring high flow nasal cannula supplementation to maintain adequate saturation. -MRSA PCR negative -Continue IV Solu-Medrol, continue augmentin and fluconazole. -Continue to wean oxygen supplementation as tolerated;  -Continue bronchodilators and mucolytics  Dysphagia -With a high risk for aspiration -Continue dysphagia 2 diet with nectar thick liquids. -Education provided for upright position during meals and after meals to continue minimizing chance of aspiration. -[name] reports having hospital bed for her at home.  Bil DVT-lower extremity venous Dopplers from 12/08/2021 noted -CTA angio chest from 12/08/2021 and repeat CTA chest 12/25/2021 without acute PE -Continue Eliquis twice daily  Chronic diastolic HF (heart failure) (HCC) -No vascular congestion appreciated on x-ray or CT scan -Continue to follow daily weights and strict I's and O's -Continue home medication regimen. -Maintain adequate hydration. -Patient chronically on HCTZ. -Continue to follow urine output.  Insulin dependent type 2 diabetes mellitus (HCC) --Recent A1c 6.3-reflecting excellent diabetic control PTA -Dissipate steroid-induced hyperglycemia -continue  levemir Use Novolog/Humalog Sliding scale insulin with Accu-Cheks/Fingersticks as ordered   Pressure injury of skin--POA -No signs of superimposed infection -Provide preventive measures and constant repositioning.  GERD (gastroesophageal reflux disease) -Continue PPI -Dose adjusted to twice a day while receiving steroids for further GI prophylaxis.  Mixed hyperlipidemia -Continue statin  HTN-c/n  metoprolol 75 mg twice daily to improve rate control and BP control    Subjective:   cough and dyspnea persist -Son at bedside -Questions answered  Physical Exam: Vitals:   01/09/22 0500 01/09/22 0754 01/09/22 0909 01/09/22 1815  BP:    (!) 135/56  Pulse:   84 82  Resp:    18  Temp:      TempSrc:       SpO2:  94%  92%  Weight: 77 kg  Height:       Gen:- Awake Alert, no acute distress HEENT:- [place].AT, No sclera icterus, Nose-- [place] 7L/min Neck-Supple Neck,No JVD,.  Lungs--breath sounds with scattered rhonchi and wheezes bilaterally CV- S1, S2 normal, regular , tachycardic Abd-  +ve B.Sounds, Abd Soft, No tenderness,    Extremity/Skin:- No  edema, pedal pulses present  Psych-affect is appropriate, oriented x3 Neuro-generalized weakness, no new focal deficits, no tremors  Family Communication: Son at bedside  Disposition: Anticipate that discharge home with home/SNF rehab Status is: Inpatient Remains inpatient appropriate because: Continue treatment for acute on chronic respiratory failure with hypoxia in the setting of pneumonitis/pneumonia and COPD exacerbation.   Author: Roxan Hockey, MD 01/09/2022 7:17 PM  For on call review [email or web address].

## 2022-01-10 DIAGNOSIS — J9621 Acute and chronic respiratory failure with hypoxia: Secondary | ICD-10-CM | POA: Diagnosis not present

## 2022-01-10 DIAGNOSIS — Z7189 Other specified counseling: Secondary | ICD-10-CM | POA: Diagnosis not present

## 2022-01-10 DIAGNOSIS — Z515 Encounter for palliative care: Secondary | ICD-10-CM | POA: Diagnosis not present

## 2022-01-10 DIAGNOSIS — I5032 Chronic diastolic (congestive) heart failure: Secondary | ICD-10-CM | POA: Diagnosis not present

## 2022-01-10 LAB — GLUCOSE, CAPILLARY
Glucose-Capillary: 265 mg/dL — ABNORMAL HIGH (ref 70–99)
Glucose-Capillary: 325 mg/dL — ABNORMAL HIGH (ref 70–99)
Glucose-Capillary: 148 mg/dL — ABNORMAL HIGH (ref 70–99)
Glucose-Capillary: 323 mg/dL — ABNORMAL HIGH (ref 70–99)

## 2022-01-10 NOTE — Consult Note (Signed)
   [facility] Inpatient Consult   01/10/2022  [name] [birth year] [id number]  [place] [facility] [ACO] Patient: [facility] Liaison remote coverage review for Michaela Moon * unplanned readmission  Primary Care Provider:  [name], MD with [facility] Internal Medicine on BI report and APL however not listed in [facility] physician website   Patient screened for less than 7 days hospitalization with noted high risk score for unplanned readmission risk to assess for potential [place] Management service needs for post hospital transition for care coordination.  Review of patient's electronic medical record reveals patient is readmission was in ICU transitioned to telemetry.  Noted palliative care notes and reviewed. This Probation officer spoke with son at previous admission and he declined follow up with [facility], states wanting [facility] nurse at that time.  *Attempted to reach out to inpatient [facility] team via Care Teams and not listed at this time.  Spoke with [name] at [employer] listed.  Explained reason for follow up call for readmission.  He states the [facility] nurse was able to come out but patient "admitted again before full home health had started."  He states he is not sure of disposition at this time but they, "will follow the recommendations of the hospital doctors."  Explained that a telephonic nurse support is still available at discharge if she returns home and he agrees, if that is where she goes.  Plan:  Continue to follow progress and disposition to assess for post hospital community care coordination/management needs.  Referral request for community care coordination determine at transition date  Of note, [facility] does not replace or interfere with any arrangements made by the Inpatient Transition of Care team.  For questions contact:   Natividad Brood, RN BSN [place]  [phone] business mobile phone Toll free office [phone]  *[place]  [phone] Fax number: [phone] [email or web address]

## 2022-01-10 NOTE — Progress Notes (Signed)
PROGRESS NOTE     Michaela Moon, is a 86 y.o. female, DOB - [birth year], ZOX:[id number]  Admit date - 01/04/2022   Admitting Physician Barton Dubois, MD  Outpatient Primary MD for the patient is [name], MD  LOS - 6  Chief Complaint  Patient presents with   Respiratory Distress        Brief Narrative:   86 y.o. female with medical history significant of COPD, diabetes, hypertension, lower extremity DVT (on chronic Eliquis), diagnosis of COVID in September 2023 and recent hospitalization for aspiration pneumonia with subsequent decline in her health status.  Patient chronically on 5 L nasal cannula supplementation due to ongoing hypoxia admitted on 01/04/2022 with presumed aspiration pneumonia with acute on chronic hypoxic respiratory failure    -Assessment and Plan: * Acute on chronic respiratory failure with hypoxia -suspect recurrent aspiration episodes and COPD exacerbation -Patient was previously on 4 L via nasal cannula, after recent admission she was discharged on  5 L nasal cannula supplementation at baseline -Currently requiring high flow nasal cannula supplementation to maintain adequate saturation. -MRSA PCR negative -Continue IV Solu-Medrol, continue augmentin and fluconazole. -Continue to wean oxygen  as tolerated; --currently requiring 7 L of oxygen via nasal cannula -Continue bronchodilators and mucolytics   Dysphagia -With a high risk for aspiration -Continue dysphagia 2 diet with nectar thick liquids. -Education provided for upright position during meals and after meals to continue minimizing chance of aspiration. -[name] reports having hospital bed for her at home.   H/o Bil DVT-lower extremity venous Dopplers from 12/08/2021 noted -CTA angio chest from 12/08/2021 and repeat CTA chest 12/25/2021 without acute PE -Continue Eliquis twice daily   Chronic diastolic HF (heart failure) (HCC) -No vascular congestion appreciated on x-ray or CT  scan -Continue to follow daily weights and strict I's and O's -Continue home medication regimen. -Maintain adequate hydration. -Patient chronically on HCTZ. -Continue to follow urine output.   Insulin dependent type 2 diabetes mellitus (HCC) --Recent A1c 6.3-reflecting excellent diabetic control PTA -Dissipate steroid-induced hyperglycemia -continue  levemir Use Novolog/Humalog Sliding scale insulin with Accu-Cheks/Fingersticks as ordered    Pressure injury of skin--POA -No signs of superimposed infection -Provide preventive measures and constant repositioning.   GERD (gastroesophageal reflux disease) -Continue PPI -Dose adjusted to twice a day while receiving steroids for further GI prophylaxis.   Mixed hyperlipidemia -Continue statin   HTN-c/n  metoprolol 75 mg twice daily to improve rate control and BP control    Disposition: Anticipate that discharge home with home/SNF rehab   Status is: Inpatient   Disposition: The patient is from: Home              Anticipated d/c is to: Home with HH Vs SNF              Anticipated d/c date is: 2 days              Patient currently is not medically stable to d/c. Barriers: Not Clinically Stable-   Code Status :  -  Code Status: DNR   Family Communication:  Discussed with son at bedside  DVT Prophylaxis  :   - SCDs  apixaban (ELIQUIS) tablet 5 mg   Lab Results  Component Value Date   PLT 165 01/09/2022    Inpatient Medications  Scheduled Meds:  amoxicillin-clavulanate  1 tablet Oral Q12H   apixaban  5 mg Oral BID   arformoterol  15 mcg Nebulization BID   aspirin EC  81 mg  Oral Q breakfast   atorvastatin  40 mg Oral Daily   budesonide (PULMICORT) nebulizer solution  0.5 mg Nebulization BID   dextromethorphan-guaiFENesin  1 tablet Oral BID   docusate sodium  100 mg Oral Daily   ferrous sulfate  325 mg Oral Daily   fluconazole  200 mg Oral Daily   fluticasone  1 spray Each Nare Daily   gabapentin  100 mg Oral TID    losartan  100 mg Oral Daily   And   hydrochlorothiazide  12.5 mg Oral Daily   insulin aspart  0-5 Units Subcutaneous QHS   insulin aspart  0-9 Units Subcutaneous TID WC   insulin aspart  5 Units Subcutaneous TID WC   insulin glargine-yfgn  20 Units Subcutaneous Daily   methylPREDNISolone (SOLU-MEDROL) injection  40 mg Intravenous Q12H   metoprolol tartrate  50 mg Oral BID   montelukast  10 mg Oral QHS   mouth rinse  15 mL Mouth Rinse 4 times per day   pantoprazole  40 mg Oral BID   polyvinyl alcohol  1 drop Both Eyes QID   revefenacin  175 mcg Nebulization Daily   Continuous Infusions: PRN Meds:.acetaminophen **OR** acetaminophen, albuterol, ondansetron **OR** ondansetron (ZOFRAN) IV, mouth rinse, polyethylene glycol   Anti-infectives (From admission, onward)    Start     Dose/Rate Route Frequency Ordered Stop   01/05/22 2000  amoxicillin-clavulanate (AUGMENTIN) 875-125 MG per tablet 1 tablet        1 tablet Oral Every 12 hours 01/05/22 1224     01/05/22 0930  vancomycin (VANCOCIN) IVPB 1000 mg/200 mL premix  Status:  Discontinued       See Hyperspace for full Linked Orders Report.   1,000 mg 200 mL/hr over 60 Minutes Intravenous Every 24 hours 01/04/22 0921 01/05/22 1224   01/04/22 2200  ceFEPIme (MAXIPIME) 2 g in sodium chloride 0.9 % 100 mL IVPB  Status:  Discontinued        2 g 200 mL/hr over 30 Minutes Intravenous Every 12 hours 01/04/22 1218 01/05/22 1224   01/04/22 2000  fluconazole (DIFLUCAN) tablet 200 mg        200 mg Oral Daily 01/04/22 1759     01/04/22 0930  vancomycin (VANCOREADY) IVPB 1750 mg/350 mL       See Hyperspace for full Linked Orders Report.   1,750 mg 175 mL/hr over 120 Minutes Intravenous  Once 01/04/22 0921 01/04/22 1229   01/04/22 0915  ceFEPIme (MAXIPIME) 2 g in sodium chloride 0.9 % 100 mL IVPB        2 g 200 mL/hr over 30 Minutes Intravenous  Once 01/04/22 0908 01/04/22 1024        Subjective: Michaela Moon today has no fevers, no emesis,   No chest pain,   -Cough, hypoxia with increasing oxygen requirement and dyspnea persist   Objective: Vitals:   01/10/22 0840 01/10/22 0909 01/10/22 0931 01/10/22 1327  BP:  (!) 125/56  (!) 147/55  Pulse:  (!) 112 (!) 106 63  Resp:    18  Temp:  97.6 F (36.4 C)  98.6 F (37 C)  TempSrc:  Oral    SpO2: 95% 97%  93%  Weight:      Height:        Intake/Output Summary (Last 24 hours) at 01/10/2022 1907 Last data filed at 01/10/2022 1700 Gross per 24 hour  Intake 840 ml  Output 800 ml  Net 40 ml   Filed Weights   01/08/22  0500 01/09/22 0500 01/10/22 0500  Weight: 75.2 kg 77 kg 76.6 kg    Physical Exam Gen:- Awake Alert, no acute distress HEENT:- [place].AT, No sclera icterus, Nose-- Wake 7L/min Neck-Supple Neck,No JVD,.  Lungs--breath sounds with scattered rhonchi and wheezes bilaterally CV- S1, S2 normal, regular , tachycardic Abd-  +ve B.Sounds, Abd Soft, No tenderness,    Extremity/Skin:- No  edema, pedal pulses present  Psych-affect is appropriate, oriented x3 Neuro-generalized weakness, no new focal deficits, no tremors  Data Reviewed: I have personally reviewed following labs and imaging studies  CBC: Recent Labs  Lab 01/04/22 0925 01/05/22 0408 01/06/22 0223 01/09/22 0349  WBC 15.6* 9.1 10.5 9.7  NEUTROABS 12.4*  --   --   --   HGB 10.9* 8.6* 8.8* 9.6*  HCT 35.7* 27.6* 28.5* 31.0*  MCV 89.7 88.5 88.8 88.3  PLT 156 112* 135* 517   Basic Metabolic Panel: Recent Labs  Lab 01/04/22 0925 01/04/22 1217 01/05/22 0408 01/06/22 0223 01/09/22 0349  NA 139  --  134* 137 137  K 4.0  --  4.8 4.4 4.7  CL 102  --  104 104 101  CO2 27  --  '24 28 30  '$ GLUCOSE 52*  --  253* 152* 219*  BUN 28*  --  21 18 26*  CREATININE 0.69  --  0.60 0.58 0.61  CALCIUM 11.7*  --  10.1 10.8* 10.8*  MG  --  1.5*  --  1.7  --   PHOS  --  3.0  --   --   --    GFR: Estimated Creatinine Clearance: 53.6 mL/min (by C-G formula based on SCr of 0.61 mg/dL). Liver Function Tests: Recent  Labs  Lab 01/04/22 0925  AST 32  ALT 81*  ALKPHOS 59  BILITOT 0.4  PROT 7.3  ALBUMIN 3.7   Cardiac Enzymes: No results for input(s): "CKTOTAL", "CKMB", "CKMBINDEX", "TROPONINI" in the last 168 hours. BNP (last 3 results) No results for input(s): "PROBNP" in the last 8760 hours. HbA1C: No results for input(s): "HGBA1C" in the last 72 hours. Sepsis Labs: '@LABRCNTIP'$ (procalcitonin:4,lacticidven:4) ) Recent Results (from the past 240 hour(s))  Culture, blood (routine x 2)     Status: None   Collection Time: 01/04/22  9:00 AM   Specimen: BLOOD  Result Value Ref Range Status   Specimen Description BLOOD RIGHT ASSIST CONTROL  Final   Special Requests   Final    BOTTLES DRAWN AEROBIC AND ANAEROBIC Blood Culture results may not be optimal due to an excessive volume of blood received in culture bottles   Culture   Final    NO GROWTH 5 DAYS Performed at [facility], [address], [place], [place] [address]    Report Status 01/09/2022 FINAL  Final  Resp Panel by RT-PCR (Flu A&B, Covid) Anterior Nasal Swab     Status: Abnormal   Collection Time: 01/04/22  9:08 AM   Specimen: Anterior Nasal Swab  Result Value Ref Range Status   SARS Coronavirus 2 by RT PCR POSITIVE (A) NEGATIVE Final    Comment: (NOTE) SARS-CoV-2 target nucleic acids are DETECTED.  The SARS-CoV-2 RNA is generally detectable in upper respiratory specimens during the acute phase of infection. Positive results are indicative of the presence of the identified virus, but do not rule out bacterial infection or co-infection with other pathogens not detected by the test. Clinical correlation with patient history and other diagnostic information is necessary to determine patient infection status. The expected result is Negative.  Fact Sheet for Patients: EntrepreneurPulse.com.au  Fact Sheet for Healthcare Providers: IncredibleEmployment.be  This test is not yet approved or cleared by  the [place] FDA and  has been authorized for detection and/or diagnosis of SARS-CoV-2 by FDA under an Emergency Use Authorization (EUA).  This EUA will remain in effect (meaning this test can be used) for the duration of  the COVID-19 declaration under Section 564(b)(1) of the A ct, 21 U.S.C. section 360bbb-3(b)(1), unless the authorization is terminated or revoked sooner.     Influenza A by PCR NEGATIVE NEGATIVE Final   Influenza B by PCR NEGATIVE NEGATIVE Final    Comment: (NOTE) The Xpert Xpress SARS-CoV-2/FLU/RSV plus assay is intended as an aid in the diagnosis of influenza from Nasopharyngeal swab specimens and should not be used as a sole basis for treatment. Nasal washings and aspirates are unacceptable for Xpert Xpress SARS-CoV-2/FLU/RSV testing.  Fact Sheet for Patients: EntrepreneurPulse.com.au  Fact Sheet for Healthcare Providers: IncredibleEmployment.be  This test is not yet approved or cleared by the [place] FDA and has been authorized for detection and/or diagnosis of SARS-CoV-2 by FDA under an Emergency Use Authorization (EUA). This EUA will remain in effect (meaning this test can be used) for the duration of the COVID-19 declaration under Section 564(b)(1) of the Act, 21 U.S.C. section 360bbb-3(b)(1), unless the authorization is terminated or revoked.  Performed at [facility], [address] [place], [place] [address]   Culture, blood (routine x 2)     Status: None   Collection Time: 01/04/22  9:21 AM   Specimen: BLOOD  Result Value Ref Range Status   Specimen Description BLOOD RIGHT ARM  Final   Special Requests   Final    BOTTLES DRAWN AEROBIC AND ANAEROBIC Blood Culture adequate volume   Culture   Final    NO GROWTH 5 DAYS Performed at [facility], [address], [place], [place] [address]    Report Status 01/09/2022 FINAL  Final  MRSA Next Gen by PCR, Nasal     Status: None   Collection Time:  01/04/22 12:39 PM   Specimen: Nasal Mucosa; Nasal Swab  Result Value Ref Range Status   MRSA by PCR Next Gen NOT DETECTED NOT DETECTED Final    Comment: (NOTE) The GeneXpert MRSA Assay (FDA approved for NASAL specimens only), is one component of a comprehensive MRSA colonization surveillance program. It is not intended to diagnose MRSA infection nor to guide or monitor treatment for MRSA infections. Test performance is not FDA approved in patients less than 88 years old. Performed at [facility], [address] [place], [place] [address]       Radiology Studies: No results found.   Scheduled Meds:  amoxicillin-clavulanate  1 tablet Oral Q12H   apixaban  5 mg Oral BID   arformoterol  15 mcg Nebulization BID   aspirin EC  81 mg Oral Q breakfast   atorvastatin  40 mg Oral Daily   budesonide (PULMICORT) nebulizer solution  0.5 mg Nebulization BID   dextromethorphan-guaiFENesin  1 tablet Oral BID   docusate sodium  100 mg Oral Daily   ferrous sulfate  325 mg Oral Daily   fluconazole  200 mg Oral Daily   fluticasone  1 spray Each Nare Daily   gabapentin  100 mg Oral TID   losartan  100 mg Oral Daily   And   hydrochlorothiazide  12.5 mg Oral Daily   insulin aspart  0-5 Units Subcutaneous QHS   insulin aspart  0-9 Units Subcutaneous TID WC   insulin aspart  5 Units Subcutaneous TID WC   insulin glargine-yfgn  20 Units Subcutaneous Daily   methylPREDNISolone (SOLU-MEDROL) injection  40 mg Intravenous Q12H   metoprolol tartrate  50 mg Oral BID   montelukast  10 mg Oral QHS   mouth rinse  15 mL Mouth Rinse 4 times per day   pantoprazole  40 mg Oral BID   polyvinyl alcohol  1 drop Both Eyes QID   revefenacin  175 mcg Nebulization Daily   Continuous Infusions:   LOS: 6 days    Roxan Hockey M.D on 01/10/2022 at 7:07 PM  Go to [email or web address] - for contact info  [facility] - Office  [phone]  If 7PM-7AM, please contact  night-coverage [email or web address] 01/10/2022, 7:07 PM

## 2022-01-10 NOTE — Evaluation (Signed)
Clinical/Bedside Swallow Evaluation Patient Details  Name: Michaela Moon MRN: [id number] Date of Birth: 1935-04-05  Today's Date: 01/10/2022 Time: SLP Start Time (ACUTE ONLY): 1053 SLP Stop Time (ACUTE ONLY): 1114 SLP Time Calculation (min) (ACUTE ONLY): 21 min  Past Medical History:  Past Medical History:  Diagnosis Date   Arthritis    Asthma    COPD (chronic obstructive pulmonary disease) ([place])    Diabetes mellitus without complication (HCC)    GERD (gastroesophageal reflux disease)    Headache    Hyperlipidemia    Hypertension    Macular degeneration    Neuropathy    Obesity    Osteopenia    Stress incontinence    Past Surgical History:  Past Surgical History:  Procedure Laterality Date   BREAST LUMPECTOMY Bilateral    no cancer   CARPAL TUNNEL RELEASE Left 10/23/2017   Procedure: LEFT CARPAL TUNNEL RELEASE;  Surgeon: Carole Civil, MD;  Location: [facility];  Service: Orthopedics;  Laterality: Left;   CARPAL TUNNEL RELEASE Right 11/20/2017   Procedure: CARPAL TUNNEL RELEASE;  Surgeon: Carole Civil, MD;  Location: [facility];  Service: Orthopedics;  Laterality: Right;   CATARACT EXTRACTION W/PHACO Left 11/01/2016   Procedure: CATARACT EXTRACTION PHACO AND INTRAOCULAR LENS PLACEMENT (IOC);  Surgeon: Baruch Goldmann, MD;  Location: [facility];  Service: Ophthalmology;  Laterality: Left;  CDE: 3.83   CATARACT EXTRACTION W/PHACO Right 11/29/2016   Procedure: CATARACT EXTRACTION PHACO AND INTRAOCULAR LENS PLACEMENT RIGHT EYE;  Surgeon: Baruch Goldmann, MD;  Location: [facility];  Service: Ophthalmology;  Laterality: Right;  CDE: 8.71   colon tumor removed  2010   Dr. 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 submucosal lipoma on path   COLONOSCOPY  05/2013   Dr. Anthony Sar: normal. (h/o colon polyps)   COLONOSCOPY  2011   normal   COLONOSCOPY  10/2003   Dr. Lindalou Hose: large polypoid mass hepatic fluexure   ESOPHAGOGASTRODUODENOSCOPY N/A 04/29/2016    Procedure: ESOPHAGOGASTRODUODENOSCOPY (EGD);  Surgeon: Danie Binder, MD;  Location: [facility];  Service: Endoscopy;  Laterality: N/A;  9:15am   SAVORY DILATION N/A 04/29/2016   Procedure: SAVORY DILATION;  Surgeon: Danie Binder, MD;  Location: [facility];  Service: Endoscopy;  Laterality: N/A;   HPI:  86 y.o. 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 Pt has been seen by ST team here during recurrent admissions to the hospital inculding BSE 11/29/21, BSE 12/11/21, MBSS 12/18/21, BSE 12/26/21. Most recent BSE recommended downgrade to D2/find chop and NTL to hopefully minimize ? aspiration episodes, however, Pt was only home for three days after being d/c on that diet prior to readmission. BSE again requested   MBSS completed 12/18/21 <<Pt presents with oropharyngeal swallowing to be essentially within functional limits. Note occasional pharyngeal penetration with thin liquids via cup and more frequent and deep/frank penetration with thin via straw, however all penetrates are during the swallow and are completely cleared by pharyngeal squeeze and pharyngeal stripping wave. Puree and regular textures are consumed without incident. Pt reports that she takes meds with puree at home; barium tablet was assessed with puree and note breif stasis of the tablet in the  cervical esophagus visualized during esophageal sweep (no radiologist present to confirm) but an additional presentation of puree facilitated tablet passing through. Otherwise esophageal sweep was unremarkable. Recommend continue with a soft diet/D3 and thin liquids, however secondary to respiratory compromise and  current deconditioning recommend avoid straws at this time. Recommend meds whole with puree and recommend universal aspiration precautions and esophageal precations. Above findings and recommendations reviewed with RN, Pt and [name]. There are no further ST needs noted at this time. ST will sign off >>   Assessment / Plan / Recommendation  Clinical Impression  Clinical swallowing evaluation completed while Pt was sitting upright in bed; [name] reports being very vigilent about providing D2/fine chop diet and NTL at home.  Despite this diet change, Pt was back for re-admission 3 days after getting home. As previously reported on last BSE: Despite no aspiration being visualized on recent MBSS, we cannot definitively state that this Pt is not experiencing episodes of aspiration or that she is possibly experiencing postprandial aspiration related to known esophageal dysphagia/GERD.   Pt with similar presentation to most recent BSE; Pt with baseline congested sounding cough. Pt consumed thin liquids, mech soft textures and NTL textures wtihout overt s/sx of aspiration. At this time, recommend continue with current diet of D2/fine chop and NTL while goals of care are being discussed and determined. Liberalizing diet may be indicated as [name] discussed wanting to talk about "timeline" with palliative NP. ST will continue to follow for ongoing treatment and recommendations, thank you. SLP Visit Diagnosis: Dysphagia, unspecified (R13.10)    Aspiration Risk  Mild aspiration risk    Diet Recommendation     Liquid Administration via: Spoon;Cup;Straw Medication Administration: Whole meds with liquid Supervision: Patient able to self feed;Intermittent supervision to cue for compensatory strategies Compensations: Slow rate Postural Changes: Seated upright at 90 degrees    Other  Recommendations Oral Care Recommendations: Oral care BID;Staff/trained caregiver to provide oral care Other Recommendations:  Clarify dietary restrictions    Recommendations for follow up therapy are one component of a multi-disciplinary discharge planning process, led by the attending physician.  Recommendations may be updated based on patient status, additional functional criteria and insurance authorization.  Follow up Recommendations Follow physician's recommendations for discharge plan and follow up therapies      Assistance Recommended at Discharge    Functional Status Assessment Patient has had a recent decline in their functional status and demonstrates the ability to make significant improvements in function in a reasonable and predictable amount of time.  Frequency and Duration min 2x/week  1 week       Prognosis Prognosis for Safe Diet Advancement: Good      Swallow Study   General HPI: 86 y.o. 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 Pt has been seen by ST team here during recurrent admissions to the hospital inculding BSE 11/29/21, BSE 12/11/21, MBSS 12/18/21, BSE 12/26/21. Most recent BSE recommended downgrade to D2/find chop and NTL to hopefully minimize ? aspiration episodes, however, Pt was only home for three days after being d/c on that diet prior to readmission. BSE again requested Type of Study: Bedside Swallow Evaluation Previous Swallow Assessment: Most recent BSE  12/26/21, prior to that [facility] 10/24 Diet Prior to this Study: Dysphagia 2 (chopped);Nectar-thick liquids Temperature Spikes Noted: No Respiratory Status: Nasal cannula History of Recent Intubation: No Behavior/Cognition: Alert;Cooperative;Pleasant mood Oral Cavity Assessment: Within Functional  Limits Oral Care Completed by SLP: Recent completion by staff Oral Cavity - Dentition: Dentures, top;Dentures, bottom Vision: Functional for self-feeding Self-Feeding Abilities: Able to feed self;Needs set up;Needs assist Patient Positioning: Upright in bed Baseline Vocal Quality: Normal Volitional Cough: Weak;Congested Volitional Swallow: Able to elicit    Oral/Motor/Sensory Function Overall Oral Motor/Sensory Function: Within functional limits   Ice Chips Ice chips: Within functional limits   Thin Liquid Thin Liquid: Within functional limits    Nectar Thick Nectar Thick Liquid: Not tested   Honey Thick Honey Thick Liquid: Not tested   Puree Puree: Within functional limits   Solid     Solid: Within functional limits     Kire Ferg H. Roddie Mc, CCC-SLP Speech Language Pathologist  [name] 01/10/2022,11:15 AM

## 2022-01-10 NOTE — Inpatient Diabetes Management (Signed)
Inpatient Diabetes Program Recommendations  AACE/ADA: New Consensus Statement on Inpatient Glycemic Control (2015)  Target Ranges:  Prepandial:   less than 140 mg/dL      Peak postprandial:   less than 180 mg/dL (1-2 hours)      Critically ill patients:  140 - 180 mg/dL   Lab Results  Component Value Date   GLUCAP 325 (H) 01/10/2022   HGBA1C 6.3 (H) 12/08/2021    Review of Glycemic Control  Diabetes history: DM2 Outpatient Diabetes medications: Lantus 20 units BID, Janumet 50-1000 mg BID, Amaryl 4 mg daily Current orders for Inpatient glycemic control: Semglee 20 units daily, Novolog 0-9 units TID with meals, Novolog 0-5 units QHS, Novolog 5 units TID with meals; Solumedrol 40 mg Q12H   Inpatient Diabetes Program Recommendations:     If steroids are continued as ordered, please consider increasing Semglee to 24 units daily.   Outpatient: Initially hypoglycemic on admission. At time of discharge, consider decreasing outpatient Lantus and decreasing or discontinuing Amaryl.   Thanks, Bronson Curb, MSN, RNC-OB Diabetes Coordinator [phone] (8a-5p)

## 2022-01-10 NOTE — TOC Progression Note (Signed)
Transition of Care [facility]) - Progression Note    Patient Details  Name: CARROLYN HILMES MRN: [id number] Date of Birth: [birth year]  Transition of Care [facility]) CM/SW Contact  Shade Flood, LCSW Phone Number: 01/10/2022, 11:35 AM  Clinical Narrative:     TOC following. MD anticipating dc tomorrow. Received update from [place] at [place] stating that pt's treatment episode ended and so pt would have to be a new referral for care at dc.  New referral initiated. Waiting on decision from [facility]  Will follow.  Expected Discharge Plan: [place] Barriers to Discharge: Continued Medical Work up  Expected Discharge Plan and Services Expected Discharge Plan: [place] In-house Referral: Clinical Social Work   Post Acute Care Choice: Resumption of Svcs/PTA Provider Living arrangements for the past 2 months: Single Family Home                                       Social Determinants of Health (SDOH) Interventions    Readmission Risk Interventions    01/07/2022   11:55 AM 12/31/2021    3:22 PM  Readmission Risk Prevention Plan  Transportation Screening Complete Complete  PCP or Specialist Appt within 5-7 Days  Complete  Home Care Screening  Complete  Medication Review (RN CM)  Complete  HRI or Home Care Consult Complete   Social Work Consult for [place] Planning/Counseling Complete   Palliative Care Screening Complete   Medication Review Press photographer) Complete

## 2022-01-10 NOTE — Progress Notes (Addendum)
Palliative: Michaela Moon is resting quietly in bed in a dark room.  She wakes easily as I enter making and somewhat keeping eye contact.  She is alert and oriented x3, able to make her needs known.  Her son, Michaela Moon, is present at bedside.  We talk about her acute and chronic health concerns including, but not limited to recurrent hospital visits for heart failure and aspiration pneumonia.  We talked in detail about the heart failure treatment plan.  Michaela Moon shares his concern that Michaela Moon is being sent home with a scale when she has poor strength and balance.  We talked about the treatment for heart failure including daily weights for recognition of fluid overload.  We also talk about aspiration pneumonia and the safest possible diet.  CODE STATUS verified as DNR.  We talk about rehospitalization.  At this point Michaela Moon states that she would like to be rehospitalized as needed.  Michaela Moon states that he will standby her wish.  Son Michaela Moon brings up dying.  We talked about preferred place of death.  Michaela Moon shares that her preferred place of death is the hospital.  Addendum: Son Michaela Moon states that no one has worked with Michaela Moon for physical therapy.  We talked about Michaela Moon ability to provide care at home.  We talk about PT evaluation to see what she qualifies for.  Michaela Moon states that she would go to short-term rehab if qualified.  Conference with attending, bedside nursing staff, speech therapy, transition of care team related to patient condition, needs, goals of care, disposition.   Plan: At this point continue to treat the treatable but no CPR or intubation.  Time for outcomes.  Rehospitalized as needed.  Preferred place of death is hospital.  50 minutes Michaela Axe, NP Palliative medicine team Team phone [phone] Greater than 50% of this time was spent counseling and coordinating care related to the above assessment and plan.

## 2022-01-11 ENCOUNTER — Inpatient Hospital Stay (HOSPITAL_COMMUNITY): Payer: Medicare Other

## 2022-01-11 DIAGNOSIS — J9621 Acute and chronic respiratory failure with hypoxia: Secondary | ICD-10-CM | POA: Diagnosis not present

## 2022-01-11 LAB — BASIC METABOLIC PANEL
Anion gap: 9 (ref 5–15)
BUN: 25 mg/dL — ABNORMAL HIGH (ref 8–23)
CO2: 27 mmol/L (ref 22–32)
Calcium: 10.5 mg/dL — ABNORMAL HIGH (ref 8.9–10.3)
Chloride: 99 mmol/L (ref 98–111)
Creatinine, Ser: 0.61 mg/dL (ref 0.44–1.00)
GFR, Estimated: >60 mL/min (ref >60)
Glucose, Bld: 238 mg/dL — ABNORMAL HIGH (ref 70–99)
Potassium: 4.8 mmol/L (ref 3.5–5.1)
Sodium: 135 mmol/L (ref 135–145)

## 2022-01-11 LAB — CBC
HCT: 32 % — ABNORMAL LOW (ref 36.0–46.0)
Hemoglobin: 10.1 g/dL — ABNORMAL LOW (ref 12.0–15.0)
MCH: 27.6 pg (ref 26.0–34.0)
MCHC: 31.6 g/dL (ref 30.0–36.0)
MCV: 87.4 fL (ref 80.0–100.0)
Platelets: 199 10*3/uL (ref 150–400)
RBC: 3.66 MIL/uL — ABNORMAL LOW (ref 3.87–5.11)
RDW: 23.2 % — ABNORMAL HIGH (ref 11.5–15.5)
WBC: 11.3 10*3/uL — ABNORMAL HIGH (ref 4.0–10.5)
nRBC: 0 % (ref 0.0–0.2)

## 2022-01-11 LAB — GLUCOSE, CAPILLARY
Glucose-Capillary: 283 mg/dL — ABNORMAL HIGH (ref 70–99)
Glucose-Capillary: 214 mg/dL — ABNORMAL HIGH (ref 70–99)
Glucose-Capillary: 180 mg/dL — ABNORMAL HIGH (ref 70–99)
Glucose-Capillary: 471 mg/dL — ABNORMAL HIGH (ref 70–99)

## 2022-01-11 NOTE — Care Management Important Message (Signed)
Important Message  Patient Details  Name: Michaela Moon MRN: [id number] Date of Birth: 01/18/1936   Medicare Important Message Given:  Yes     Tommy Medal 01/11/2022, 11:54 AM

## 2022-01-11 NOTE — Plan of Care (Signed)
  Problem: Acute Rehab PT Goals(only PT should resolve) Goal: Patient Will Transfer Sit To/From Stand Outcome: Progressing Flowsheets (Taken 01/11/2022 1242) Patient will transfer sit to/from stand: with modified independence Goal: Pt Will Transfer Bed To Chair/Chair To Bed Outcome: Progressing Flowsheets (Taken 01/11/2022 1242) Pt will Transfer Bed to Chair/Chair to Bed: with modified independence Goal: Pt Will Ambulate Outcome: Progressing Flowsheets (Taken 01/11/2022 1242) Pt will Ambulate:  50 feet  with rolling walker  with supervision Goal: Pt/caregiver will Perform Home Exercise Program Outcome: Progressing Flowsheets (Taken 01/11/2022 1242) Pt/caregiver will Perform Home Exercise Program:  For increased strengthening  For improved balance  Independently  12:42 PM, 01/11/22 Mearl Latin PT, DPT Physical Therapist at [facility]

## 2022-01-11 NOTE — Progress Notes (Signed)
PROGRESS NOTE     Michaela Moon, is a 86 y.o. female, DOB - [birth year], VQQ:[id number]  Admit date - 01/04/2022   Admitting Physician Michaela Dubois, MD  Outpatient Primary MD for the patient is Michaela Burly, MD  LOS - 7  Chief Complaint  Patient presents with   Respiratory Distress        Brief Narrative:   86 y.o. female with medical history significant of COPD, diabetes, hypertension, lower extremity DVT (on chronic Eliquis), diagnosis of COVID in September 2023 and recent hospitalization for aspiration pneumonia with subsequent decline in her health status.  Patient chronically on 5 L nasal cannula supplementation due to ongoing hypoxia admitted on 01/04/2022 with presumed aspiration pneumonia with acute on chronic hypoxic respiratory failure    -Assessment and Plan: * Acute on chronic respiratory failure with hypoxia -suspect recurrent aspiration episodes and COPD exacerbation -Patient was previously on 4 L via nasal cannula, after recent admission she was discharged on  5 L nasal cannula supplementation at baseline -Currently requiring high flow nasal cannula supplementation to maintain adequate saturation. -MRSA PCR negative --Continue bronchodilators and mucolytics -Continue IV Solu-Medrol, continue augmentin and fluconazole. 01/11/22 -Continue to wean oxygen  as tolerated; --currently requiring 6 L of oxygen via nasal cannula -Cough, dyspnea and wheezing improving slowly   Dysphagia -With a high risk for aspiration -Continue dysphagia 2 diet with nectar thick liquids. -Education provided for upright position during meals and after meals to continue minimizing chance of aspiration. -[name] reports having hospital bed for her at home.   H/o Bil DVT-lower extremity venous Dopplers from 12/08/2021 noted -CTA angio chest from 12/08/2021 and repeat CTA chest 12/25/2021 without acute PE -Continue Eliquis twice daily   Chronic diastolic HF (heart failure)  (HCC) -No vascular congestion appreciated on x-ray or CT scan -Continue to follow daily weights and strict I's and O's -Continue home medication regimen. -Maintain adequate hydration. -Patient chronically on HCTZ. -Continue to follow urine output.   Insulin dependent type 2 diabetes mellitus (HCC) --Recent A1c 6.3-reflecting excellent diabetic control PTA -Dissipate steroid-induced hyperglycemia -continue  levemir Use Novolog/Humalog Sliding scale insulin with Accu-Cheks/Fingersticks as ordered    Pressure injury of skin--POA -No signs of superimposed infection -Provide preventive measures and constant repositioning.   GERD (gastroesophageal reflux disease) -Continue PPI -Dose adjusted to twice a day while receiving steroids for further GI prophylaxis.   Mixed hyperlipidemia -Continue statin   HTN-c/n  metoprolol 75 mg twice daily to improve rate control and BP control    Disposition: Anticipate that discharge home with home Vs SNF rehab   Status is: Inpatient   Disposition: The patient is from: Home              Anticipated d/c is to: Home with HH Vs SNF              Anticipated d/c date is: 2 days              Patient currently is not medically stable to d/c. Barriers: Not Clinically Stable-   Code Status :  -  Code Status: DNR   Family Communication:  Discussed with son at bedside  DVT Prophylaxis  :   - SCDs  apixaban (ELIQUIS) tablet 5 mg   Lab Results  Component Value Date   PLT 199 01/11/2022   Inpatient Medications  Scheduled Meds:  amoxicillin-clavulanate  1 tablet Oral Q12H   apixaban  5 mg Oral BID   arformoterol  15 mcg Nebulization  BID   aspirin EC  81 mg Oral Q breakfast   atorvastatin  40 mg Oral Daily   budesonide (PULMICORT) nebulizer solution  0.5 mg Nebulization BID   dextromethorphan-guaiFENesin  1 tablet Oral BID   docusate sodium  100 mg Oral Daily   ferrous sulfate  325 mg Oral Daily   fluconazole  200 mg Oral Daily   fluticasone  1  spray Each Nare Daily   gabapentin  100 mg Oral TID   losartan  100 mg Oral Daily   And   hydrochlorothiazide  12.5 mg Oral Daily   insulin aspart  0-5 Units Subcutaneous QHS   insulin aspart  0-9 Units Subcutaneous TID WC   insulin aspart  5 Units Subcutaneous TID WC   insulin glargine-yfgn  20 Units Subcutaneous Daily   methylPREDNISolone (SOLU-MEDROL) injection  40 mg Intravenous Q12H   metoprolol tartrate  50 mg Oral BID   montelukast  10 mg Oral QHS   mouth rinse  15 mL Mouth Rinse 4 times per day   pantoprazole  40 mg Oral BID   polyvinyl alcohol  1 drop Both Eyes QID   revefenacin  175 mcg Nebulization Daily   Continuous Infusions: PRN Meds:.acetaminophen **OR** acetaminophen, albuterol, ondansetron **OR** ondansetron (ZOFRAN) IV, mouth rinse, polyethylene glycol   Anti-infectives (From admission, onward)    Start     Dose/Rate Route Frequency Ordered Stop   01/05/22 2000  amoxicillin-clavulanate (AUGMENTIN) 875-125 MG per tablet 1 tablet        1 tablet Oral Every 12 hours 01/05/22 1224     01/05/22 0930  vancomycin (VANCOCIN) IVPB 1000 mg/200 mL premix  Status:  Discontinued       See Hyperspace for full Linked Orders Report.   1,000 mg 200 mL/hr over 60 Minutes Intravenous Every 24 hours 01/04/22 0921 01/05/22 1224   01/04/22 2200  ceFEPIme (MAXIPIME) 2 g in sodium chloride 0.9 % 100 mL IVPB  Status:  Discontinued        2 g 200 mL/hr over 30 Minutes Intravenous Every 12 hours 01/04/22 1218 01/05/22 1224   01/04/22 2000  fluconazole (DIFLUCAN) tablet 200 mg        200 mg Oral Daily 01/04/22 1759     01/04/22 0930  vancomycin (VANCOREADY) IVPB 1750 mg/350 mL       See Hyperspace for full Linked Orders Report.   1,750 mg 175 mL/hr over 120 Minutes Intravenous  Once 01/04/22 0921 01/04/22 1229   01/04/22 0915  ceFEPIme (MAXIPIME) 2 g in sodium chloride 0.9 % 100 mL IVPB        2 g 200 mL/hr over 30 Minutes Intravenous  Once 01/04/22 0908 01/04/22 1024         Subjective: Michaela Moon today has no fevers, no emesis,  No chest pain,   -Son says Cough, and dyspnea appears somewhat better   Objective: Vitals:   01/11/22 0800 01/11/22 0839 01/11/22 1328 01/11/22 1951  BP:  127/65 (!) 126/56 (!) 131/56  Pulse:  85 (!) 55 96  Resp:   20 20  Temp:   98.2 F (36.8 C) 98.6 F (37 C)  TempSrc:   Oral Oral  SpO2: 95%  93% 90%  Weight:      Height:        Intake/Output Summary (Last 24 hours) at 01/11/2022 2014 Last data filed at 01/11/2022 1730 Gross per 24 hour  Intake 240 ml  Output 1550 ml  Net -1310 ml  Filed Weights   01/09/22 0500 01/10/22 0500 01/11/22 0543  Weight: 77 kg 76.6 kg 75 kg    Physical Exam Gen:- Awake Alert, no acute distress HEENT:- [place].AT, No sclera icterus, Nose-- [place] 6L/min Neck-Supple Neck,No JVD,.  Lungs--breath sounds with scattered rhonchi and wheezes bilaterally CV- S1, S2 normal, regular , tachycardic Abd-  +ve B.Sounds, Abd Soft, No tenderness,    Extremity/Skin:- No  edema, pedal pulses present  Psych-affect is appropriate, oriented x3 Neuro-generalized weakness, no new focal deficits, no tremors  Data Reviewed: I have personally reviewed following labs and imaging studies  CBC: Recent Labs  Lab 01/05/22 0408 01/06/22 0223 01/09/22 0349 01/11/22 0329  WBC 9.1 10.5 9.7 11.3*  HGB 8.6* 8.8* 9.6* 10.1*  HCT 27.6* 28.5* 31.0* 32.0*  MCV 88.5 88.8 88.3 87.4  PLT 112* 135* 165 810   Basic Metabolic Panel: Recent Labs  Lab 01/05/22 0408 01/06/22 0223 01/09/22 0349 01/11/22 0329  NA 134* 137 137 135  K 4.8 4.4 4.7 4.8  CL 104 104 101 99  CO2 '24 28 30 27  '$ GLUCOSE 253* 152* 219* 238*  BUN 21 18 26* 25*  CREATININE 0.60 0.58 0.61 0.61  CALCIUM 10.1 10.8* 10.8* 10.5*  MG  --  1.7  --   --    GFR: Estimated Creatinine Clearance: 53.1 mL/min (by C-G formula based on SCr of 0.61 mg/dL).  ) Recent Results (from the past 240 hour(s))  Culture, blood (routine x 2)     Status: None    Collection Time: 01/04/22  9:00 AM   Specimen: BLOOD  Result Value Ref Range Status   Specimen Description BLOOD RIGHT ASSIST CONTROL  Final   Special Requests   Final    BOTTLES DRAWN AEROBIC AND ANAEROBIC Blood Culture results may not be optimal due to an excessive volume of blood received in culture bottles   Culture   Final    NO GROWTH 5 DAYS Performed at [facility], [address] [place], [place] [address]    Report Status 01/09/2022 FINAL  Final  Resp Panel by RT-PCR (Flu A&B, Covid) Anterior Nasal Swab     Status: Abnormal   Collection Time: 01/04/22  9:08 AM   Specimen: Anterior Nasal Swab  Result Value Ref Range Status   SARS Coronavirus 2 by RT PCR POSITIVE (A) NEGATIVE Final    Comment: (NOTE) SARS-CoV-2 target nucleic acids are DETECTED.  The SARS-CoV-2 RNA is generally detectable in upper respiratory specimens during the acute phase of infection. Positive results are indicative of the presence of the identified virus, but do not rule out bacterial infection or co-infection with other pathogens not detected by the test. Clinical correlation with patient history and other diagnostic information is necessary to determine patient infection status. The expected result is Negative.  Fact Sheet for Patients: EntrepreneurPulse.com.au  Fact Sheet for Healthcare Providers: IncredibleEmployment.be  This test is not yet approved or cleared by the [place] FDA and  has been authorized for detection and/or diagnosis of SARS-CoV-2 by FDA under an Emergency Use Authorization (EUA).  This EUA will remain in effect (meaning this test can be used) for the duration of  the COVID-19 declaration under Section 564(b)(1) of the A ct, 21 U.S.C. section 360bbb-3(b)(1), unless the authorization is terminated or revoked sooner.     Influenza A by PCR NEGATIVE NEGATIVE Final   Influenza B by PCR NEGATIVE NEGATIVE Final    Comment: (NOTE) The  Xpert Xpress SARS-CoV-2/FLU/RSV plus assay is intended  as an aid in the diagnosis of influenza from Nasopharyngeal swab specimens and should not be used as a sole basis for treatment. Nasal washings and aspirates are unacceptable for Xpert Xpress SARS-CoV-2/FLU/RSV testing.  Fact Sheet for Patients: EntrepreneurPulse.com.au  Fact Sheet for Healthcare Providers: IncredibleEmployment.be  This test is not yet approved or cleared by the [place] FDA and has been authorized for detection and/or diagnosis of SARS-CoV-2 by FDA under an Emergency Use Authorization (EUA). This EUA will remain in effect (meaning this test can be used) for the duration of the COVID-19 declaration under Section 564(b)(1) of the Act, 21 U.S.C. section 360bbb-3(b)(1), unless the authorization is terminated or revoked.  Performed at [facility], [address] [place], [place] [address]   Culture, blood (routine x 2)     Status: None   Collection Time: 01/04/22  9:21 AM   Specimen: BLOOD  Result Value Ref Range Status   Specimen Description BLOOD RIGHT ARM  Final   Special Requests   Final    BOTTLES DRAWN AEROBIC AND ANAEROBIC Blood Culture adequate volume   Culture   Final    NO GROWTH 5 DAYS Performed at [facility], [address], [place], [place] [address]    Report Status 01/09/2022 FINAL  Final  MRSA Next Gen by PCR, Nasal     Status: None   Collection Time: 01/04/22 12:39 PM   Specimen: Nasal Mucosa; Nasal Swab  Result Value Ref Range Status   MRSA by PCR Next Gen NOT DETECTED NOT DETECTED Final    Comment: (NOTE) The GeneXpert MRSA Assay (FDA approved for NASAL specimens only), is one component of a comprehensive MRSA colonization surveillance program. It is not intended to diagnose MRSA infection nor to guide or monitor treatment for MRSA infections. Test performance is not FDA approved in patients less than 63 years old. Performed at [facility], [address] [place], [place] [address]       Radiology Studies: DG CHEST PORT 1 VIEW  Result Date: 01/11/2022 CLINICAL DATA:  [id number] Dyspnea and respiratory abnormalities [id number] EXAM: PORTABLE CHEST 1 VIEW COMPARISON:  January 04, 2022 FINDINGS: The cardiomediastinal silhouette is unchanged in contour.Atherosclerotic calcifications of the aorta. No significant pleural effusion. No pneumothorax. Mildly increased RIGHT-sided airspace opacities in comparison to prior. A similar to mildly improved LEFT-sided airspace opacities. Remote LEFT humeral fracture. IMPRESSION: Mildly increased RIGHT-sided airspace opacities. Similar to mildly improved LEFT-sided airspace opacities. Electronically Signed   By: Valentino Saxon M.D.   On: 01/11/2022 11:04     Scheduled Meds:  amoxicillin-clavulanate  1 tablet Oral Q12H   apixaban  5 mg Oral BID   arformoterol  15 mcg Nebulization BID   aspirin EC  81 mg Oral Q breakfast   atorvastatin  40 mg Oral Daily   budesonide (PULMICORT) nebulizer solution  0.5 mg Nebulization BID   dextromethorphan-guaiFENesin  1 tablet Oral BID   docusate sodium  100 mg Oral Daily   ferrous sulfate  325 mg Oral Daily   fluconazole  200 mg Oral Daily   fluticasone  1 spray Each Nare Daily   gabapentin  100 mg Oral TID   losartan  100 mg Oral Daily   And   hydrochlorothiazide  12.5 mg Oral Daily   insulin aspart  0-5 Units Subcutaneous QHS   insulin aspart  0-9 Units Subcutaneous TID WC   insulin aspart  5 Units Subcutaneous TID WC   insulin glargine-yfgn  20 Units Subcutaneous Daily  methylPREDNISolone (SOLU-MEDROL) injection  40 mg Intravenous Q12H   metoprolol tartrate  50 mg Oral BID   montelukast  10 mg Oral QHS   mouth rinse  15 mL Mouth Rinse 4 times per day   pantoprazole  40 mg Oral BID   polyvinyl alcohol  1 drop Both Eyes QID   revefenacin  175 mcg Nebulization Daily   Continuous Infusions:   LOS: 7 days    Roxan Hockey M.D on 01/11/2022  at 8:14 PM  Go to [email or web address] - for contact info  [facility] - Office  [phone]  If 7PM-7AM, please contact night-coverage [email or web address] 01/11/2022, 8:14 PM

## 2022-01-11 NOTE — Inpatient Diabetes Management (Signed)
Inpatient Diabetes Program Recommendations  AACE/ADA: New Consensus Statement on Inpatient Glycemic Control   Target Ranges:  Prepandial:   less than 140 mg/dL      Peak postprandial:   less than 180 mg/dL (1-2 hours)      Critically ill patients:  140 - 180 mg/dL    Latest Reference Range & Units 01/10/22 07:44 01/10/22 11:31 01/10/22 16:14 01/10/22 21:37 01/11/22 07:57  Glucose-Capillary 70 - 99 mg/dL 265 (H) 325 (H) 148 (H) 323 (H) 283 (H)   Review of Glycemic Control  Diabetes history: DM2 Outpatient Diabetes medications: Lantus 20 units BID, Janumet 50-1000 mg BID, Amaryl 4 mg daily Current orders for Inpatient glycemic control: Semglee 20 units daily, Novolog 0-9 units TID with meals, Novolog 0-5 units QHS, Novolog 5 units TID with meals; Solumedrol 40 mg Q12H   Inpatient Diabetes Program Recommendations:     Insulin:  If steroids are continued as ordered, please consider increasing Semglee to 24 units daily.   Outpatient: Initially hypoglycemic on admission. At time of discharge, consider decreasing outpatient Lantus and decreasing or discontinuing Amaryl.   Thanks, Barnie Alderman, RN, MSN, [place] Diabetes Coordinator Inpatient Diabetes Program [phone] (Team Pager from 8am to [place])

## 2022-01-11 NOTE — TOC Progression Note (Signed)
Transition of Care [facility]) - Progression Note    Patient Details  Name: Michaela Moon MRN: [id number] Date of Birth: 1935-03-15  [facility]) CM/SW Contact  Shade Flood, LCSW Phone Number: 01/11/2022, 3:00 PM  Clinical Narrative:     TOC notified by Judson Roch at [facility] that they will not be able to accept pt back after all due to pt's payor source not being episodic payer.   Updated pt/son and offered referral to another agency. she/her/hers are agreeable to referral to [facility]. Spoke with Tommi Rumps at [facility] who has accepted the referral.  Weekend TOC will follow.  Expected Discharge Plan: [place] Barriers to Discharge: Continued Medical Work up  Expected Discharge Plan and Services Expected Discharge Plan: [place] In-house Referral: Clinical Social Work   Post Acute Care Choice: Resumption of Svcs/PTA Provider Living arrangements for the past 2 months: [place]: PT, RN [facility] Agency: [place] Date [place]: 01/11/22   Representative spoke with at [place]: [place] ([place]) Interventions    Readmission Risk Interventions    01/07/2022   11:55 AM 12/31/2021    3:22 PM  Readmission Risk Prevention Plan  Transportation Screening Complete Complete  PCP or Specialist Appt within 5-7 Days  Complete  Home Care Screening  Complete  Medication Review (RN CM)  Complete  HRI or Home Care Consult Complete   Social Work Consult for [place] Planning/Counseling Complete   Palliative Care Screening Complete   Medication Review Press photographer) Complete

## 2022-01-11 NOTE — Evaluation (Signed)
Physical Therapy Evaluation Patient Details Name: Michaela Moon MRN: [id number] DOB: 25-Jun-1935 Today's Date: 01/11/2022  History of Present Illness  Michaela Moon is a 86 y.o. female with medical history significant of COPD, diabetes, hypertension, lower extremity DVT (on chronic Eliquis), diagnosis of COVID in September 2023 and recent hospitalization for aspiration pneumonia with subsequent decline in her health status.  Patient chronically on 5 L nasal cannula supplementation due to ongoing hypoxia.  Actively still with discharge prescriptions for steroids tapering and oral antibiotics.  Who presented to the emergency department secondary to worsening respiratory failure and hypoxia into the mid 80s despite the use of 10 L of oxygen supplementation.  Patient reports that after discharge (01/02/2022) she was doing okay until early this morning when she woke up having worsening respiratory distress.  Patient found with increased tachypnea and poor air movement.  Patient expressed positive productive coughing spells and general malaise.  There has not been any fever, chills, sick contacts, reported nausea, vomiting, dysuria, hematuria, melena, hematochezia or focal weaknesses.   Clinical Impression  Patient limited for functional mobility as stated below secondary to BLE weakness, fatigue and impaired activity tolerance. Patient requiring min guard assist at most today. She transfers and standing and ambulates with use of RW. She completes 2 bouts of ambulation of about 15 feet each time and is limited by fatigue and SOB. Patient ends session seated in chair.  Patient will benefit from continued physical therapy in hospital and recommended venue below to increase strength, balance, endurance for safe ADLs and gait.        Recommendations for follow up therapy are one component of a multi-disciplinary discharge planning process, led by the attending physician.  Recommendations may be updated based on  patient status, additional functional criteria and insurance authorization.  Follow Up Recommendations Home health PT      Assistance Recommended at Discharge Intermittent Supervision/Assistance  Patient can return home with the following  A little help with walking and/or transfers;A little help with bathing/dressing/bathroom;Help with stairs or ramp for entrance;Assistance with cooking/housework    Equipment Recommendations None recommended by PT  Recommendations for Other Services       Functional Status Assessment Patient has had a recent decline in their functional status and demonstrates the ability to make significant improvements in function in a reasonable and predictable amount of time.     Precautions / Restrictions Precautions Precautions: Fall Restrictions Weight Bearing Restrictions: No      Mobility  Bed Mobility Overal bed mobility: Modified Independent Bed Mobility: Supine to Sit     Supine to sit: Supervision     General bed mobility comments: increased time; has hospital bed at home    Transfers Overall transfer level: Modified independent Equipment used: Rolling walker (2 wheels) Transfers: Sit to/from Stand Sit to Stand: Supervision, Min guard   Step pivot transfers: Supervision            Ambulation/Gait Ambulation/Gait assistance: Supervision, Min guard Gait Distance (Feet): 30 Feet (2 bouts of 15 feet) Assistive device: Rolling walker (2 wheels) Gait Pattern/deviations: Decreased step length - right, Decreased step length - left, Decreased stride length Gait velocity: decreased     General Gait Details: slow, labored cadence with RW  Stairs            Wheelchair Mobility    Modified Rankin (Stroke Patients Only)       Balance Overall balance assessment: Needs assistance Sitting-balance support: Feet supported, No upper extremity supported  Sitting balance-Leahy Scale: Good Sitting balance - Comments: sits on EOB  without an postural leaning   Standing balance support: During functional activity, Bilateral upper extremity supported Standing balance-Leahy Scale: Good Standing balance comment: good with RW and SBA/ CGA                             Pertinent Vitals/Pain Pain Assessment Pain Assessment: No/denies pain    Home Living Family/patient expects to be discharged to:: Private residence Living Arrangements: Children Available Help at Discharge: Family;Available 24 hours/day Type of Home: Mobile home Home Access: Stairs to enter Entrance Stairs-Rails: None Entrance Stairs-Number of Steps: 1 step   Home Layout: One level Home Equipment: Standard Walker;BSC/3in1;Wheelchair - manual;Cane - single point;Hospital bed Additional Comments: hospital bed in living room    Prior Function Prior Level of Function : Needs assist       Physical Assist : Mobility (physical);ADLs (physical) Mobility (physical): Bed mobility;Transfers;Gait;Stairs   Mobility Comments: household and very short distanced community using standard walker PRN ADLs Comments: assisted by family     Hand Dominance        Extremity/Trunk Assessment   Upper Extremity Assessment Upper Extremity Assessment: Generalized weakness    Lower Extremity Assessment Lower Extremity Assessment: Generalized weakness    Cervical / Trunk Assessment Cervical / Trunk Assessment: Kyphotic  Communication   Communication: No difficulties  Cognition Arousal/Alertness: Awake/alert Behavior During Therapy: WFL for tasks assessed/performed Overall Cognitive Status: Within Functional Limits for tasks assessed                                          General Comments      Exercises     Assessment/Plan    PT Assessment Patient needs continued PT services  PT Problem List Decreased strength;Decreased activity tolerance;Decreased balance;Decreased mobility       PT Treatment Interventions DME  instruction;Gait training;Stair training;Functional mobility training;Therapeutic activities;Therapeutic exercise;Patient/family education;Balance training    PT Goals (Current goals can be found in the Care Plan section)  Acute Rehab PT Goals Patient Stated Goal: return home with family to assist PT Goal Formulation: With patient/family Time For Goal Achievement: 01/25/22 Potential to Achieve Goals: Good    Frequency Min 3X/week     Co-evaluation               AM-PAC PT "6 Clicks" Mobility  Outcome Measure Help needed turning from your back to your side while in a flat bed without using bedrails?: None Help needed moving from lying on your back to sitting on the side of a flat bed without using bedrails?: None Help needed moving to and from a bed to a chair (including a wheelchair)?: A Little Help needed standing up from a chair using your arms (e.g., wheelchair or bedside chair)?: A Little Help needed to walk in hospital room?: A Little Help needed climbing 3-5 steps with a railing? : A Lot 6 Click Score: 19    End of Session Equipment Utilized During Treatment: Oxygen Activity Tolerance: Patient tolerated treatment well;Patient limited by fatigue Patient left: in chair;with call bell/phone within reach;with family/visitor present Nurse Communication: Mobility status PT Visit Diagnosis: Unsteadiness on feet (R26.81);Other abnormalities of gait and mobility (R26.89);Muscle weakness (generalized) (M62.81)    Time: 6761-9509 PT Time Calculation (min) (ACUTE ONLY): 21 min   Charges:   PT  Evaluation $PT Eval Low Complexity: 1 Low PT Treatments $Therapeutic Activity: 8-22 mins         12:41 PM, 01/11/22 Mearl Latin PT, DPT Physical Therapist at [facility]

## 2022-01-11 NOTE — TOC Progression Note (Signed)
Transition of Care [facility]) - Progression Note    Patient Details  Name: Michaela Moon MRN: [id number] Date of Birth: [birth year]  Transition of [facility] CM/SW Contact  Shade Flood, LCSW Phone Number: 01/11/2022, 11:15 AM  Clinical Narrative:     TOC following. MD anticipating dc Sunday. PT has evaluated and recommendation is for [facility] PT. Pt/son in agreement. [facility] has informed TOC that they will accept pt again for [facility].  Weekend TOC will follow.  Expected Discharge Plan: [place] Barriers to Discharge: Continued Medical Work up  Expected Discharge Plan and Services Expected Discharge Plan: [place] In-house Referral: Clinical Social Work   Post Acute Care Choice: Resumption of Svcs/PTA Provider Living arrangements for the past 2 months: Single Family Home                                       Social Determinants of Health (SDOH) Interventions    Readmission Risk Interventions    01/07/2022   11:55 AM 12/31/2021    3:22 PM  Readmission Risk Prevention Plan  Transportation Screening Complete Complete  PCP or Specialist Appt within 5-7 Days  Complete  Home Care Screening  Complete  Medication Review (RN CM)  Complete  HRI or Home Care Consult Complete   Social Work Consult for [place] Planning/Counseling Complete   Palliative Care Screening Complete   Medication Review [employer]) Complete

## 2022-01-11 NOTE — Progress Notes (Signed)
Speech Language Pathology Treatment: Dysphagia  Patient Details Name: Michaela Moon MRN: [id number] DOB: 1936-01-30 Today's Date: 01/11/2022 Time: 4098-1191 SLP Time Calculation (min) (ACUTE ONLY): 22 min  Assessment / Plan / Recommendation Clinical Impression  Ongoing diagnostic dysphagia therapy provided this am. Reviewed Pt's recent MBSS and discussed with Pt and [name] that the diet adjustment to NTL did not make a significant difference (Pt was still back in the hospital within 3 days). As Pt did not aspirate thin liquids on MBSS and at bedside presentation with thin liquids and NTL is the same recommend upgrading Pt's liquids to thin. Recommend continue with D2/fine chop diet as Pt continues to report choking episodes with breads, tough meats and hard textures. ST will make diet adjustment and continue to follow for ongoing treatment and education, thank you.    HPI HPI: 86 y.o. 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 Pt has been seen by ST team here during recurrent admissions to the hospital inculding BSE 11/29/21, BSE 12/11/21, MBSS 12/18/21, BSE 12/26/21. Most recent BSE recommended downgrade to D2/find chop and NTL to hopefully minimize ? aspiration episodes, however, Pt was only home for three days after being d/c on that diet prior to readmission. BSE again requested      SLP Plan  Continue with current plan of care      Recommendations for follow up therapy are one component of a multi-disciplinary discharge planning process, led by the attending physician.  Recommendations may be updated based on patient status, additional functional criteria  and insurance authorization.    Recommendations  Diet recommendations: Thin liquid;Dysphagia 2 (fine chop) Liquids provided via: Cup;Straw Medication Administration: Whole meds with liquid Supervision: Patient able to self feed Compensations: Slow rate Postural Changes and/or Swallow Maneuvers: Seated upright 90 degrees;Upright 30-60 min after meal                Oral Care Recommendations: Oral care BID;Staff/trained caregiver to provide oral care Follow Up Recommendations: Follow physician's recommendations for discharge plan and follow up therapies Assistance recommended at discharge: Intermittent Supervision/Assistance SLP Visit Diagnosis: Dysphagia, unspecified (R13.10) Plan: Continue with current plan of care        Jabarie Pop H. Roddie Mc, CCC-SLP Speech Language Pathologist    [name]  01/11/2022, 9:26 AM

## 2022-01-12 DIAGNOSIS — J9621 Acute and chronic respiratory failure with hypoxia: Secondary | ICD-10-CM | POA: Diagnosis not present

## 2022-01-12 LAB — TROPONIN I (HIGH SENSITIVITY): Troponin I (High Sensitivity): 10 ng/L (ref <18)

## 2022-01-12 LAB — GLUCOSE, CAPILLARY
Glucose-Capillary: 278 mg/dL — ABNORMAL HIGH (ref 70–99)
Glucose-Capillary: 389 mg/dL — ABNORMAL HIGH (ref 70–99)
Glucose-Capillary: 223 mg/dL — ABNORMAL HIGH (ref 70–99)

## 2022-01-12 MED ORDER — ONDANSETRON HCL 4 MG PO TABS: 4.0000 mg | ORAL_TABLET | Freq: Four times a day (QID) | ORAL | 0 refills | Status: AC | PRN

## 2022-01-12 MED ORDER — APIXABAN 5 MG PO TABS: 5.0000 mg | ORAL_TABLET | Freq: Two times a day (BID) | ORAL | 5 refills | Status: AC

## 2022-01-12 MED ORDER — DOCUSATE SODIUM 100 MG PO CAPS: 100.0000 mg | ORAL_CAPSULE | Freq: Two times a day (BID) | ORAL | 2 refills | Status: AC

## 2022-01-12 MED ORDER — IPRATROPIUM-ALBUTEROL 18-103 MCG/ACT IN AERO: 2.0000 | INHALATION_SPRAY | Freq: Four times a day (QID) | RESPIRATORY_TRACT | 2 refills | Status: AC | PRN

## 2022-01-12 MED ORDER — FLUCONAZOLE 200 MG PO TABS: 200.0000 mg | ORAL_TABLET | Freq: Every day | ORAL | 0 refills | Status: AC

## 2022-01-12 MED ORDER — ACETAMINOPHEN 325 MG PO TABS: 650.0000 mg | ORAL_TABLET | Freq: Four times a day (QID) | ORAL | 3 refills | Status: AC | PRN

## 2022-01-12 MED ORDER — DM-GUAIFENESIN ER 30-600 MG PO TB12: 1.0000 | ORAL_TABLET | Freq: Two times a day (BID) | ORAL | 0 refills | Status: AC

## 2022-01-12 MED ORDER — PREDNISONE 10 MG PO TABS: 10.0000 mg | ORAL_TABLET | Freq: Every day | ORAL | 0 refills | Status: AC

## 2022-01-12 MED ORDER — AMOXICILLIN-POT CLAVULANATE 875-125 MG PO TABS: 1.0000 | ORAL_TABLET | Freq: Two times a day (BID) | ORAL | 0 refills | Status: AC

## 2022-01-12 MED ORDER — ALUM & MAG HYDROXIDE-SIMETH 200-200-20 MG/5ML PO SUSP
30.0000 mL | ORAL | Status: DC | PRN
  Administered 2022-01-12: 30 mL via ORAL
  Filled 2022-01-12: qty 30

## 2022-01-12 MED ORDER — ALBUTEROL SULFATE (2.5 MG/3ML) 0.083% IN NEBU: 2.5000 mg | INHALATION_SOLUTION | Freq: Four times a day (QID) | RESPIRATORY_TRACT | 5 refills | Status: AC | PRN

## 2022-01-12 MED ORDER — PANTOPRAZOLE SODIUM 40 MG PO TBEC: 40.0000 mg | DELAYED_RELEASE_TABLET | Freq: Every day | ORAL | 2 refills | Status: AC

## 2022-01-12 MED ORDER — GUAIFENESIN ER 600 MG PO TB12: 1200.0000 mg | ORAL_TABLET | Freq: Two times a day (BID) | ORAL | 0 refills | Status: AC | PRN

## 2022-01-12 NOTE — Progress Notes (Addendum)
Walked from bed to bathroom and voided and now sitting up in chair.  Pericare and foley care completed and new purwick placed.  Chest pain relieved with Maalox.  Son at at bedside.  Just walked to bathroom at 1300 and had a bm and voided.  Becomes very winded and weak on ambulating.

## 2022-01-12 NOTE — Progress Notes (Addendum)
C/O chest pain.  EKG obtained and vitals stable with SR  and 97 on monitor.  Dr. Roxan Hockey rounding and notified in person.  Patient now says her pain feels more like indigestion and Dr.Emokpae ordered Maalox and troponin

## 2022-01-12 NOTE — Discharge Summary (Signed)
Michaela Moon, is a 86 y.o. female  DOB 1935-04-18  MRN [id number].  Admission date:  01/04/2022  Admitting Physician  Barton Dubois, MD  Discharge Date:  01/12/2022   Primary MD  [name], MD  Recommendations for primary care physician for things to follow:   1)Take 40 mg (4 tab) daily for 3 days , then 30 mg (3 Tab) daily for 3 days, then Take 20 mg (2 Tab) daily for 3 days, then Take 10 mg (10 mg) daily for 3 days... Then STOP   2)You are taking Eliquis/Apixaban which is a Blood Thinner so Avoid ibuprofen/Advil/Aleve/Motrin/Goody Powders/Naproxen/BC powders/Meloxicam/Diclofenac/Indomethacin and other Nonsteroidal anti-inflammatory medications as these will make you more likely to bleed and can cause stomach ulcers, can also cause Kidney problems.    3)Please repeat CBC and BMP blood test within 1 week   4)you need oxygen at home at 5 L via nasal cannula continuously while awake and while asleep--- smoking or having open fires around oxygen can cause fire, significant injury and death  5)-Speech pathologist consults appreciated recommends dysphagia 2 (Mech soft); Nectar thickened liquids----Please sit upright when you are eating and eat slowly to avoid choking/aspiration  Admission Diagnosis  Acute on chronic respiratory failure with hypoxia (HCC) [J96.21]   Discharge Diagnosis  Acute on chronic respiratory failure with hypoxia (HCC) [J96.21]  ***  Principal Problem:   Acute on chronic respiratory failure with hypoxia (HCC) Active Problems:   Dysphagia   Essential hypertension   Mixed hyperlipidemia   GERD (gastroesophageal reflux disease)   Pressure injury of skin   Lactic acidosis   Insulin dependent type 2 diabetes mellitus (HCC)   Chronic diastolic HF (heart failure) (HCC)      Past Medical History:  Diagnosis Date   Arthritis    Asthma    COPD (chronic obstructive pulmonary  disease) ([place])    Diabetes mellitus without complication (HCC)    GERD (gastroesophageal reflux disease)    Headache    Hyperlipidemia    Hypertension    Macular degeneration    Neuropathy    Obesity    Osteopenia    Stress incontinence     Past Surgical History:  Procedure Laterality Date   BREAST LUMPECTOMY Bilateral    no cancer   CARPAL TUNNEL RELEASE Left 10/23/2017   Procedure: LEFT CARPAL TUNNEL RELEASE;  Surgeon: Carole Civil, MD;  Location: [facility];  Service: Orthopedics;  Laterality: Left;   CARPAL TUNNEL RELEASE Right 11/20/2017   Procedure: CARPAL TUNNEL RELEASE;  Surgeon: Carole Civil, MD;  Location: [facility];  Service: Orthopedics;  Laterality: Right;   CATARACT EXTRACTION W/PHACO Left 11/01/2016   Procedure: CATARACT EXTRACTION PHACO AND INTRAOCULAR LENS PLACEMENT (IOC);  Surgeon: Baruch Goldmann, MD;  Location: [facility];  Service: Ophthalmology;  Laterality: Left;  CDE: 3.83   CATARACT EXTRACTION W/PHACO Right 11/29/2016   Procedure: CATARACT EXTRACTION PHACO AND INTRAOCULAR LENS PLACEMENT RIGHT EYE;  Surgeon: Baruch Goldmann, MD;  Location: [facility];  Service: Ophthalmology;  Laterality:  Right;  CDE: 8.71   colon tumor removed  2010   Dr. 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 submucosal lipoma on path   COLONOSCOPY  05/2013   Dr. Anthony Sar: normal. (h/o colon polyps)   COLONOSCOPY  2011   normal   COLONOSCOPY  10/2003   Dr. Lindalou Hose: large polypoid mass hepatic fluexure   ESOPHAGOGASTRODUODENOSCOPY N/A 04/29/2016   Procedure: ESOPHAGOGASTRODUODENOSCOPY (EGD);  Surgeon: Danie Binder, MD;  [facility];  Service: Endoscopy;  Laterality: N/A;  9:15am   SAVORY DILATION N/A 04/29/2016   Procedure: SAVORY DILATION;  Surgeon: Danie Binder, MD;  [facility];  Service: Endoscopy;  Laterality: N/A;       HPI  from the history and physical done on the day of admission:   HPI: Michaela Moon is a 86 y.o.  female with medical history significant of COPD, diabetes, hypertension, lower extremity DVT (on chronic Eliquis), diagnosis of COVID in September 2023 and recent hospitalization for aspiration pneumonia with subsequent decline in her health status.  Patient chronically on 5 L nasal cannula supplementation due to ongoing hypoxia.  Actively still with discharge prescriptions for steroids tapering and oral antibiotics.  Who presented to the emergency department secondary to worsening respiratory failure and hypoxia into the mid 80s despite the use of 10 L of oxygen supplementation.  Patient reports that after discharge (01/02/2022) she was doing okay until early this morning when she woke up having worsening respiratory distress.  Patient found with increased tachypnea and poor air movement.  Patient expressed positive productive coughing spells and general malaise.  There has not been any fever, chills, sick contacts, reported nausea, vomiting, dysuria, hematuria, melena, hematochezia or focal weaknesses.   In the ED CT angiogram demonstrated no pulmonary embolism and positive extensive bilateral heterogeneous and consolidative airspace opacities consistent with multifocal pneumonia/aspiration.   Review of Systems: As mentioned in the history of present illness. All other systems reviewed and are negative.     Hospital Course:   Brief Narrative:   86 y.o. female with medical history significant of COPD, diabetes, hypertension, lower extremity DVT (on chronic Eliquis), diagnosis of COVID in September 2023 and recent hospitalization for aspiration pneumonia with subsequent decline in her health status.  Patient chronically on 5 L nasal cannula supplementation due to ongoing hypoxia admitted on 01/04/2022 with presumed aspiration pneumonia with acute on chronic hypoxic respiratory failure     -Assessment and Plan: * Acute on chronic respiratory failure with hypoxia -suspect recurrent aspiration episodes  and COPD exacerbation -Patient was previously on 4 L via nasal cannula, after recent admission she was discharged on  5 L nasal cannula supplementation at baseline -Currently requiring high flow nasal cannula supplementation to maintain adequate saturation. -MRSA PCR negative --Continue bronchodilators and mucolytics -Continue IV Solu-Medrol, continue augmentin and fluconazole. 01/11/22 -Continue to wean oxygen  as tolerated; --currently requiring 6 L of oxygen via nasal cannula -Cough, dyspnea and wheezing improving slowly   Dysphagia -With a high risk for aspiration -Continue dysphagia 2 diet with nectar thick liquids. -Education provided for upright position during meals and after meals to continue minimizing chance of aspiration. -[name] reports having hospital bed for her at home.   H/o Bil DVT-lower extremity venous Dopplers from 12/08/2021 noted -CTA angio chest from 12/08/2021 and repeat CTA chest 12/25/2021 without acute PE -Continue Eliquis twice daily   Chronic diastolic HF (heart failure) (HCC) -No vascular congestion appreciated on x-ray or  CT scan -Continue to follow daily weights and strict I's and O's -Continue home medication regimen. -Maintain adequate hydration. -Patient chronically on HCTZ. -Continue to follow urine output.   Insulin dependent type 2 diabetes mellitus (HCC) --Recent A1c 6.3-reflecting excellent diabetic control PTA -Dissipate steroid-induced hyperglycemia -continue  levemir Use Novolog/Humalog Sliding scale insulin with Accu-Cheks/Fingersticks as ordered    Pressure injury of skin--POA -No signs of superimposed infection -Provide preventive measures and constant repositioning.   GERD (gastroesophageal reflux disease) -Continue PPI -Dose adjusted to twice a day while receiving steroids for further GI prophylaxis.   Mixed hyperlipidemia -Continue statin   HTN-c/n  metoprolol 75 mg twice daily to improve rate control and BP control     Disposition: Anticipate that discharge home with home Vs SNF rehab    Status is: Inpatient    Disposition: The patient is from: Home              Anticipated d/c is to: Home with HH Vs SNF  Discharge Condition: ***  Follow UP   Follow-up Information     [facility].   Specialty: [place] Why: [facility] staff will call you to arrange in home visits Contact information: [place] [address] [phone]                  Consults obtained - ***  Diet and Activity recommendation:  As advised  Discharge Instructions    **** Discharge Instructions     Call MD for:  difficulty breathing, headache or visual disturbances   Complete by: As directed    Call MD for:  persistant dizziness or light-headedness   Complete by: As directed    Call MD for:  persistant nausea and vomiting   Complete by: As directed    Call MD for:  temperature >100.4   Complete by: As directed    Diet - low sodium heart healthy   Complete by: As directed    Discharge instructions   Complete by: As directed    1)Take 40 mg (4 tab) daily for 3 days , then 30 mg (3 Tab) daily for 3 days, then Take 20 mg (2 Tab) daily for 3 days, then Take 10 mg (10 mg) daily for 3 days... Then STOP   2)You are taking Eliquis/Apixaban which is a Blood Thinner so Avoid ibuprofen/Advil/Aleve/Motrin/Goody Powders/Naproxen/BC powders/Meloxicam/Diclofenac/Indomethacin and other Nonsteroidal anti-inflammatory medications as these will make you more likely to bleed and can cause stomach ulcers, can also cause Kidney problems.    3)Please repeat CBC and BMP blood test within 1 week   4)you need oxygen at home at 5 L via nasal cannula continuously while awake and while asleep--- smoking or having open fires around oxygen can cause fire, significant injury and death  5)-Speech pathologist consults appreciated recommends dysphagia 2 (Mech soft);  Nectar thickened liquids----Please sit upright when you are eating and eat slowly to avoid choking/aspiration   Discharge wound care:   Complete by: As directed    As advised   Increase activity slowly   Complete by: As directed          Discharge Medications     Allergies as of 01/12/2022       Reactions   Onion    wheezing        Medication List     STOP taking these medications    aspirin 81 MG tablet  TAKE these medications    acetaminophen 325 MG tablet Commonly known as: TYLENOL Take 2 tablets (650 mg total) by mouth every 6 (six) hours as needed for mild pain (or Fever >/= 101).   albuterol (2.5 MG/3ML) 0.083% nebulizer solution Commonly known as: PROVENTIL Take 3 mLs (2.5 mg total) by nebulization every 6 (six) hours as needed for wheezing or shortness of breath.   albuterol-ipratropium 18-103 MCG/ACT inhaler Commonly known as: COMBIVENT Inhale 2 puffs into the lungs every 6 (six) hours as needed for wheezing.   amoxicillin-clavulanate 875-125 MG tablet Commonly known as: AUGMENTIN Take 1 tablet by mouth 2 (two) times daily for 5 days.   apixaban 5 MG Tabs tablet Commonly known as: ELIQUIS Take 1 tablet (5 mg total) by mouth 2 (two) times daily.   ARTIFICIAL TEARS OP Place 1 drop into both eyes 4 (four) times daily.   atorvastatin 80 MG tablet Commonly known as: LIPITOR ALTERNATE TAKING 1 TABLET EVERY OTHER DAY WITH 1/2 TABLET EVERY OTHERDAY   Breztri Aerosphere 160-9-4.8 MCG/ACT Aero Generic drug: Budeson-Glycopyrrol-Formoterol Inhale 2 puffs into the lungs in the morning and at bedtime.   CENTRUM SILVER PO Take 1 tablet by mouth daily.   dextromethorphan-guaiFENesin 30-600 MG 12hr tablet Commonly known as: MUCINEX DM Take 1 tablet by mouth 2 (two) times daily for 10 days.   docusate sodium 100 MG capsule Commonly known as: Colace Take 1 capsule (100 mg total) by mouth 2 (two) times daily. What changed: when to take this    ferrous sulfate 325 (65 FE) MG tablet Take 1 tablet (325 mg total) by mouth daily.   Fish Oil 1000 MG Caps Take 1,000 mg by mouth in the morning, at noon, and at bedtime.   fluconazole 200 MG tablet Commonly known as: DIFLUCAN Take 1 tablet (200 mg total) by mouth daily.   fluticasone 50 MCG/ACT nasal spray Commonly known as: FLONASE Place 1 spray into both nostrils daily.   gabapentin 100 MG capsule Commonly known as: NEURONTIN Take 1 capsule (100 mg total) by mouth 3 (three) times daily.   glimepiride 2 MG tablet Commonly known as: AMARYL Take 4 mg by mouth daily with breakfast.   guaiFENesin 600 MG 12 hr tablet Commonly known as: Mucinex Take 2 tablets (1,200 mg total) by mouth 2 (two) times daily as needed for cough or to loosen phlegm.   HAIR/SKIN/NAILS PO Take 1 tablet by mouth daily. Takes once a day.   Lantus SoloStar 100 UNIT/ML Solostar Pen Generic drug: insulin glargine Inject 20 Units into the skin in the morning and at bedtime.   losartan-hydrochlorothiazide 100-12.5 MG tablet Commonly known as: HYZAAR TAKE ONE (1) TABLET EACH DAY What changed: See the new instructions.   metoprolol tartrate 50 MG tablet Commonly known as: LOPRESSOR Take 50 mg by mouth 2 (two) times daily.   montelukast 10 MG tablet Commonly known as: SINGULAIR TAKE ONE TABLET BY MOUTH AT BEDTIME   ondansetron 4 MG tablet Commonly known as: ZOFRAN Take 1 tablet (4 mg total) by mouth every 6 (six) hours as needed for nausea.   oxybutynin 5 MG tablet Commonly known as: DITROPAN Take 5 mg by mouth every 8 (eight) hours as needed for bladder spasms.   pantoprazole 40 MG tablet Commonly known as: PROTONIX Take 1 tablet (40 mg total) by mouth daily.   polyethylene glycol 17 g packet Commonly known as: MIRALAX / GLYCOLAX Take 17 g by mouth daily as needed for mild constipation.   predniSONE  10 MG tablet Commonly known as: DELTASONE Take 1 tablet (10 mg total) by mouth daily with  breakfast. Take 40 mg (4 tab) daily for 3 days , then 30 mg (3 Tab) daily for 3 days, then Take 20 mg (2 Tab) daily for 3 days, then Take 10 mg (10 mg) daily for 3 days... Then STOP   sitaGLIPtin-metformin 50-1000 MG tablet Commonly known as: JANUMET Take 1 tablet by mouth 2 (two) times daily with a meal.   triamcinolone cream 0.1 % Commonly known as: KENALOG Apply 1 application topically daily as needed (eczema).               Discharge Care Instructions  (From admission, onward)           Start     Ordered   01/12/22 0000  Discharge wound care:       Comments: As advised   01/12/22 1602            Major procedures and Radiology Reports - PLEASE review detailed and final reports for all details, in brief -   ***  DG CHEST PORT 1 VIEW  Result Date: 01/11/2022 CLINICAL DATA:  [id number] Dyspnea and respiratory abnormalities [id number] EXAM: PORTABLE CHEST 1 VIEW COMPARISON:  January 04, 2022 FINDINGS: The cardiomediastinal silhouette is unchanged in contour.Atherosclerotic calcifications of the aorta. No significant pleural effusion. No pneumothorax. Mildly increased RIGHT-sided airspace opacities in comparison to prior. A similar to mildly improved LEFT-sided airspace opacities. Remote LEFT humeral fracture. IMPRESSION: Mildly increased RIGHT-sided airspace opacities. Similar to mildly improved LEFT-sided airspace opacities. Electronically Signed   By: Valentino Saxon M.D.   On: 01/11/2022 11:04   CT Angio Chest PE W/Cm &/Or Wo Cm  Result Date: 01/04/2022 CLINICAL DATA:  PE suspected shortness of breath EXAM: CT ANGIOGRAPHY CHEST WITH CONTRAST TECHNIQUE: Multidetector CT imaging of the chest was performed using the standard protocol during bolus administration of intravenous contrast. Multiplanar CT image reconstructions and MIPs were obtained to evaluate the vascular anatomy. RADIATION DOSE REDUCTION: This exam was performed according to the departmental dose-optimization  program which includes automated exposure control, adjustment of the mA and/or kV according to patient size and/or use of iterative reconstruction technique. CONTRAST:  62m OMNIPAQUE IOHEXOL 350 MG/ML SOLN COMPARISON:  12/25/2021 FINDINGS: Cardiovascular: Satisfactory opacification of the pulmonary arteries to the segmental level. No evidence of pulmonary embolism. Normal heart size. Left and right coronary artery calcifications. No pericardial effusion. Aortic atherosclerosis. Mediastinum/Nodes: No enlarged mediastinal, hilar, or axillary lymph nodes. Thyroid gland, trachea, and esophagus demonstrate no significant findings. Lungs/Pleura: Extensive bilateral heterogeneous and consolidative airspace opacity is not significantly changed compared to prior examination. No pleural effusion or pneumothorax. Upper Abdomen: No acute abnormality. Musculoskeletal: No chest wall abnormality. No acute osseous findings. Review of the MIP images confirms the above findings. IMPRESSION: 1. Negative examination for pulmonary embolism. 2. Extensive bilateral heterogeneous and consolidative airspace opacity is not significantly changed compared to prior examination. Findings are consistent with multifocal infection and/or aspiration. 3. Coronary artery disease. Aortic Atherosclerosis (ICD10-I70.0). Electronically Signed   By: ADelanna AhmadiM.D.   On: 01/04/2022 11:27   DG Chest Port 1 View  Result Date: 01/04/2022 CLINICAL DATA:  Shortness of breath. EXAM: PORTABLE CHEST 1 VIEW COMPARISON:  December 31, 2021. FINDINGS: The heart size and mediastinal contours are within normal limits. Stable patchy airspace opacities are noted bilaterally concerning for multifocal pneumonia. The visualized skeletal structures are unremarkable. IMPRESSION: Stable bilateral lung opacities as described above. Aortic  Atherosclerosis (ICD10-I70.0). Electronically Signed   By: Marijo Conception M.D.   On: 01/04/2022 09:30   DG CHEST PORT 1  VIEW  Result Date: 12/31/2021 CLINICAL DATA:  [id number] with dyspnea and respiratory abnormalities. EXAM: PORTABLE CHEST 1 VIEW COMPARISON:  Portable 12/29/2021 FINDINGS: 5:01 a.m. The cardiac size is normal. The mediastinum is normally outlined. The aortic arch is heavily calcified. Patchy airspace disease in the mid to lower lung fields, is unchanged on the left but mildly worsened on the right, with increased small right pleural effusion extending into the horizontal fissure. The upper lung fields remain clear. There is osteopenia and thoracic spondylosis. IMPRESSION: 1. Patchy airspace disease in the mid to lower lung fields bilaterally, mildly worsened on the right, with increased small right pleural effusion. 2. Aortic atherosclerosis. Electronically Signed   By: Telford Nab M.D.   On: 12/31/2021 07:16   DG CHEST PORT 1 VIEW  Result Date: 12/29/2021 CLINICAL DATA:  Dyspnea and respiratory abnormality EXAM: PORTABLE CHEST 1 VIEW COMPARISON:  12/25/2021 FINDINGS: Normal heart size and mediastinal contours. Persisting bilateral airspace disease. Pneumonia/aspiration findings by recent chest CT. No visible effusion or pneumothorax. Artifact from EKG leads. IMPRESSION: Unchanged bilateral pneumonia/aspiration. Electronically Signed   By: Jorje Guild M.D.   On: 12/29/2021 08:13   CT Angio Chest PE W and/or Wo Contrast  Result Date: 12/25/2021 CLINICAL DATA:  High clinical suspicion of pulmonary embolism, hypoxemia EXAM: CT ANGIOGRAPHY CHEST WITH CONTRAST TECHNIQUE: Multidetector CT imaging of the chest was performed using the standard protocol during bolus administration of intravenous contrast. Multiplanar CT image reconstructions and MIPs were obtained to evaluate the vascular anatomy. RADIATION DOSE REDUCTION: This exam was performed according to the departmental dose-optimization program which includes automated exposure control, adjustment of the mA and/or kV according to patient size and/or use  of iterative reconstruction technique. CONTRAST:  76m OMNIPAQUE IOHEXOL 350 MG/ML SOLN COMPARISON:  12/08/2021 FINDINGS: Cardiovascular: Atherosclerotic calcifications aorta and coronary arteries. Aorta normal caliber without aneurysm or dissection. No pericardial effusion. Pulmonary arteries well opacified and patent. No evidence of pulmonary embolism. Mediastinum/Nodes: Esophagus unremarkable. Base of cervical region normal appearance. No thoracic adenopathy. Lungs/Pleura: Extensive BILATERAL airspace infiltrates consistent with multifocal pneumonia. No pleural effusion or pneumothorax. No discrete mass. Upper Abdomen: Small hepatic LEFT lobe and RIGHT renal cysts, simple features; no follow-up imaging recommended. Remaining visualized upper abdomen unremarkable. Musculoskeletal: No acute osseous findings. Review of the MIP images confirms the above findings. IMPRESSION: No evidence of pulmonary embolism. Extensive BILATERAL airspace infiltrates consistent with multifocal pneumonia. Scattered atherosclerotic calcifications including coronary arteries. Aortic Atherosclerosis (ICD10-I70.0). Electronically Signed   By: MLavonia DanaM.D.   On: 12/25/2021 11:00   DG Chest Port 1 View  Result Date: 12/25/2021 CLINICAL DATA:  Shortness of breath EXAM: PORTABLE CHEST 1 VIEW COMPARISON:  Previous studies including the examination of 12/13/2021 FINDINGS: Transverse diameter of heart is increased. Central pulmonary vessels are prominent. There are patchy alveolar densities in both parahilar regions and lower lung fields with interval worsening. Lateral costophrenic angles are indistinct. There is no pneumothorax. IMPRESSION: Cardiomegaly. Central pulmonary vessels are prominent. There is interval worsening of alveolar densities in parahilar regions and lower lung fields, especially in right lower lung field. Findings suggest pulmonary edema or multifocal pneumonia with interval worsening. Small bilateral pleural  effusions. Electronically Signed   By: PElmer PickerM.D.   On: 12/25/2021 09:15   DG Swallowing Func-Speech Pathology  Result Date: 12/18/2021 Table formatting from the original result was  not included. Objective Swallowing Evaluation: Type of Study: MBS-Modified Barium Swallow Study  Patient Details Name: ZIASIA LENOIR MRN: [id number] Date of Birth: 05-26-1935 Today's Date: 12/18/2021 Time: SLP Start Time (ACUTE ONLY): 11 -SLP Stop Time (ACUTE ONLY): 7782 SLP Time Calculation (min) (ACUTE ONLY): 29 min Past Medical History: Past Medical History: Diagnosis Date  Arthritis   Asthma   COPD (chronic obstructive pulmonary disease) ([place])   Diabetes mellitus without complication (HCC)   GERD (gastroesophageal reflux disease)   Headache   Hyperlipidemia   Hypertension   Macular degeneration   Neuropathy   Obesity   Osteopenia   Stress incontinence  Past Surgical History: Past Surgical History: Procedure Laterality Date  BREAST LUMPECTOMY Bilateral   no cancer  CARPAL TUNNEL RELEASE Left 10/23/2017  Procedure: LEFT CARPAL TUNNEL RELEASE;  Surgeon: Carole Civil, MD;  Location[facility];  Service: Orthopedics;  Laterality: Left;  CARPAL TUNNEL RELEASE Right 11/20/2017  Procedure: CARPAL TUNNEL RELEASE;  Surgeon: Carole Civil, MD;  Location: [facility];  Service: Orthopedics;  Laterality: Right;  CATARACT EXTRACTION W/PHACO Left 11/01/2016  Procedure: CATARACT EXTRACTION PHACO AND INTRAOCULAR LENS PLACEMENT (IOC);  Surgeon: Baruch Goldmann, MD;  Location: [facility];  Service: Ophthalmology;  Laterality: Left;  CDE: 3.83  CATARACT EXTRACTION W/PHACO Right 11/29/2016  Procedure: CATARACT EXTRACTION PHACO AND INTRAOCULAR LENS PLACEMENT RIGHT EYE;  Surgeon: Baruch Goldmann, MD;  Location: [facility];  Service: Ophthalmology;  Laterality: Right;  CDE: 8.71  colon tumor removed  2010  Dr. 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 submucosal lipoma on path  COLONOSCOPY  05/2013  Dr. Anthony Sar:  normal. (h/o colon polyps)  COLONOSCOPY  2011  normal  COLONOSCOPY  10/2003  Dr. Lindalou Hose: large polypoid mass hepatic fluexure  ESOPHAGOGASTRODUODENOSCOPY N/A 04/29/2016  Procedure: ESOPHAGOGASTRODUODENOSCOPY (EGD);  Surgeon: Danie Binder, MD;  [facility];  Service: Endoscopy;  Laterality: N/A;  9:15am  SAVORY DILATION N/A 04/29/2016  Procedure: SAVORY DILATION;  Surgeon: Danie Binder, MD;  [facility];  Service: Endoscopy;  Laterality: N/A; HPI: 87 y.o. female with medical history significant of hypertension, T2DM, COPD, dysphagia requiring dilation who presents to the emergency department due to shortness of breath.  She was recently admitted from 10/4 to 10/10 due to acute on chronic hypoxemic respiratory failure secondary to multifocal pneumonia which was treated with IV Rocephin and azithromycin. Prior to this, she was admitted at [facility] admitted 9/2 to 9/12 for acute respiratory failure, required BiPAP.  She was positive for COVID and treated with antibiotics and Paxlovid for multifocal pneumonia. [name] reports to me she has had PNA ~5-7 times in the past 2 years. CXR reveals: "Interval partial clearing of bibasilar airspace opacities, most  consistent with improving pneumonia. Continued follow-up recommended  to document complete clearing. No new findings identified." BSE requested.  Subjective: "I sometimes have trouble swallowing solid foods."  Recommendations for follow up therapy are one component of a multi-disciplinary discharge planning process, led by the attending physician.  Recommendations may be updated based on patient status, additional functional criteria and insurance authorization. Assessment / Plan / Recommendation   12/18/2021   2:00 PM Clinical Impressions Clinical Impression  Pt presents with oropharyngeal swallowing to be essentially within functional limits. Note occasional pharyngeal penetration with thin liquids via cup and more frequent and  deep/frank penetration with thin via straw, however all penetrates are during the swallow and are completely cleared by pharyngeal squeeze and pharyngeal stripping  wave. Puree and regular textures are consumed without incident. Pt reports that she takes meds with puree at home; barium tablet was assessed with puree and note breif stasis of the tablet in the cervical esophagus visualized during esophageal sweep (no radiologist present to confirm) but an additional presentation of puree facilitated tablet passing through. Otherwise esophageal sweep was unremarkable. Recommend continue with a soft diet/D3 and thin liquids, however secondary to respiratory compromise and current deconditioning recommend avoid straws at this time. Recommend meds whole with puree and recommend universal aspiration precautions and esophageal precations. Above findings and recommendations reviewed with RN, Pt and [name]. There are no further ST needs noted at this time. ST will sign off SLP Visit Diagnosis Dysphagia, unspecified (R13.10) Impact on safety and function Mild aspiration risk     12/18/2021   2:00 PM Treatment Recommendations Treatment Recommendations Therapy as outlined in treatment plan below     12/18/2021   2:00 PM Prognosis Prognosis for Safe Diet Advancement Good   12/18/2021   2:00 PM Diet Recommendations SLP Diet Recommendations Thin liquid;Dysphagia 3 (Mech soft) solids Liquid Administration via Cup;Straw Medication Administration Whole meds with puree Compensations Slow rate     12/18/2021   2:00 PM Other Recommendations Oral Care Recommendations Oral care BID Other Recommendations Clarify dietary restrictions Follow Up Recommendations Follow physician's recommendations for discharge plan and follow up therapies   12/09/2021   3:00 PM Frequency and Duration  Speech Therapy Frequency (ACUTE ONLY) min 2x/week Treatment Duration 1 week     12/18/2021   2:00 PM Oral Phase Oral Phase [facility]    12/18/2021   2:00 PM Pharyngeal  Phase Pharyngeal Phase Impaired Pharyngeal- Thin Teaspoon WFL Pharyngeal- Thin Cup Penetration/Aspiration during swallow Pharyngeal- Thin Straw Penetration/Aspiration during swallow    12/18/2021   2:00 PM Cervical Esophageal Phase  Cervical Esophageal Phase WFL Amelia H. Roddie Mc, CCC-SLP Speech Language Pathologist [name] 12/18/2021, 3:11 PM                      Micro Results   *** Recent Results (from the past 240 hour(s))  Culture, blood (routine x 2)     Status: None   Collection Time: 01/04/22  9:00 AM   Specimen: BLOOD  Result Value Ref Range Status   Specimen Description BLOOD RIGHT ASSIST CONTROL  Final   Special Requests   Final    BOTTLES DRAWN AEROBIC AND ANAEROBIC Blood Culture results may not be optimal due to an excessive volume of blood received in culture bottles   Culture   Final    NO GROWTH 5 DAYS Performed at [facility], [address], [place], [place] [address]    Report Status 01/09/2022 FINAL  Final  Resp Panel by RT-PCR (Flu A&B, Covid) Anterior Nasal Swab     Status: Abnormal   Collection Time: 01/04/22  9:08 AM   Specimen: Anterior Nasal Swab  Result Value Ref Range Status   SARS Coronavirus 2 by RT PCR POSITIVE (A) NEGATIVE Final    Comment: (NOTE) SARS-CoV-2 target nucleic acids are DETECTED.  The SARS-CoV-2 RNA is generally detectable in upper respiratory specimens during the acute phase of infection. Positive results are indicative of the presence of the identified virus, but do not rule out bacterial infection or co-infection with other pathogens not detected by the test. Clinical correlation with patient history and other diagnostic information is necessary to determine patient infection status. The expected result is Negative.  Fact  Sheet for Patients: EntrepreneurPulse.com.au  Fact Sheet for Healthcare Providers: IncredibleEmployment.be  This test is not yet approved or cleared by the  [place] FDA and  has been authorized for detection and/or diagnosis of SARS-CoV-2 by FDA under an Emergency Use Authorization (EUA).  This EUA will remain in effect (meaning this test can be used) for the duration of  the COVID-19 declaration under Section 564(b)(1) of the A ct, 21 U.S.C. section 360bbb-3(b)(1), unless the authorization is terminated or revoked sooner.     Influenza A by PCR NEGATIVE NEGATIVE Final   Influenza B by PCR NEGATIVE NEGATIVE Final    Comment: (NOTE) The Xpert Xpress SARS-CoV-2/FLU/RSV plus assay is intended as an aid in the diagnosis of influenza from Nasopharyngeal swab specimens and should not be used as a sole basis for treatment. Nasal washings and aspirates are unacceptable for Xpert Xpress SARS-CoV-2/FLU/RSV testing.  Fact Sheet for Patients: EntrepreneurPulse.com.au  Fact Sheet for Healthcare Providers: IncredibleEmployment.be  This test is not yet approved or cleared by the [place] FDA and has been authorized for detection and/or diagnosis of SARS-CoV-2 by FDA under an Emergency Use Authorization (EUA). This EUA will remain in effect (meaning this test can be used) for the duration of the COVID-19 declaration under Section 564(b)(1) of the Act, 21 U.S.C. section 360bbb-3(b)(1), unless the authorization is terminated or revoked.  Performed at [facility], [address] [place], [place] [address]   Culture, blood (routine x 2)     Status: None   Collection Time: 01/04/22  9:21 AM   Specimen: BLOOD  Result Value Ref Range Status   Specimen Description BLOOD RIGHT ARM  Final   Special Requests   Final    BOTTLES DRAWN AEROBIC AND ANAEROBIC Blood Culture adequate volume   Culture   Final    NO GROWTH 5 DAYS Performed at [facility], [address], [place], [place] [address]    Report Status 01/09/2022 FINAL  Final  MRSA Next Gen by PCR, Nasal     Status: None   Collection Time: 01/04/22  12:39 PM   Specimen: Nasal Mucosa; Nasal Swab  Result Value Ref Range Status   MRSA by PCR Next Gen NOT DETECTED NOT DETECTED Final    Comment: (NOTE) The GeneXpert MRSA Assay (FDA approved for NASAL specimens only), is one component of a comprehensive MRSA colonization surveillance program. It is not intended to diagnose MRSA infection nor to guide or monitor treatment for MRSA infections. Test performance is not FDA approved in patients less than 55 years old. Performed at [facility], [address], [place], [place] [address]     Today   Subjective    Kynsley Whitehouse today has no ***          Patient has been seen and examined prior to discharge   Objective   Blood pressure (!) 119/53, pulse 71, temperature 97.7 F (36.5 C), temperature source Oral, resp. rate (!) 22, height '5\' 5"'$  (1.651 m), weight 74.5 kg, SpO2 91 %.   Intake/Output Summary (Last 24 hours) at 01/12/2022 1604 Last data filed at 01/11/2022 2300 Gross per 24 hour  Intake 240 ml  Output 300 ml  Net -60 ml    Exam Gen:- Awake Alert, no acute distress *** HEENT:- [place].AT, No sclera icterus Neck-Supple Neck,No JVD,.  Lungs-  CTAB , good air movement bilaterally CV- S1, S2 normal, regular Abd-  +ve B.Sounds, Abd Soft, No tenderness,    Extremity/Skin:- No  edema,  good pulses Psych-affect is appropriate, oriented x3 Neuro-no new focal deficits, no tremors ***   Data Review   CBC w Diff:  Lab Results  Component Value Date   WBC 11.3 (H) 01/11/2022   HGB 10.1 (L) 01/11/2022   HGB 10.9 (L) 10/19/2020   HCT 32.0 (L) 01/11/2022   HCT 34.5 10/19/2020   PLT 199 01/11/2022   PLT 188 10/19/2020   LYMPHOPCT 16 01/04/2022   MONOPCT 2 01/04/2022   EOSPCT 1 01/04/2022   BASOPCT 0 01/04/2022    CMP:  Lab Results  Component Value Date   NA 135 01/11/2022   NA 139 10/19/2020   K 4.8 01/11/2022   CL 99 01/11/2022   CO2 27 01/11/2022   BUN 25 (H) 01/11/2022   BUN 12 10/19/2020   CREATININE 0.61  01/11/2022   PROT 7.3 01/04/2022   PROT 7.3 10/19/2020   ALBUMIN 3.7 01/04/2022   ALBUMIN 4.2 10/19/2020   BILITOT 0.4 01/04/2022   BILITOT 0.3 10/19/2020   ALKPHOS 59 01/04/2022   AST 32 01/04/2022   ALT 81 (H) 01/04/2022  .  Total Discharge time is about 33 minutes  Roxan Hockey M.D on 01/12/2022 at 4:04 PM  Go to [email or web address] -  for contact info  [facility] - Office  [phone]

## 2022-01-12 NOTE — Discharge Instructions (Signed)
1)Take 40 mg (4 tab) daily for 3 days , then 30 mg (3 Tab) daily for 3 days, then Take 20 mg (2 Tab) daily for 3 days, then Take 10 mg (10 mg) daily for 3 days... Then STOP   2)You are taking Eliquis/Apixaban which is a Blood Thinner so Avoid ibuprofen/Advil/Aleve/Motrin/Goody Powders/Naproxen/BC powders/Meloxicam/Diclofenac/Indomethacin and other Nonsteroidal anti-inflammatory medications as these will make you more likely to bleed and can cause stomach ulcers, can also cause Kidney problems.    3)Please repeat CBC and BMP blood test within 1 week   4)you need oxygen at home at 5 L via nasal cannula continuously while awake and while asleep--- smoking or having open fires around oxygen can cause fire, significant injury and death  5)-Speech pathologist consults appreciated recommends dysphagia 2 (Mech soft); Nectar thickened liquids----Please sit upright when you are eating and eat slowly to avoid choking/aspiration

## 2022-01-12 NOTE — TOC Transition Note (Addendum)
Transition of Care [facility]) - CM/SW Discharge Note   Patient Details  Name: Michaela Moon MRN: [id number] Date of Birth: 1935-05-06  Transition of Care [facility] CM/SW Contact:  Joanne Chars, LCSW Phone Number: 01/12/2022, 3:52 PM   Clinical Narrative:   Pt discharging home with [facility] health.  Son [name] in room and will provide transportation home.  CSW spoke with [name], pt does have home O2 services in place and he does have O2 tank in the room for transport home.  CSW called and LM with [facility] regarding pt discharging today.    Final next level of care: [place] Barriers to Discharge: Barriers Resolved   Patient Goals and CMS Choice Patient states their goals for this hospitalization and ongoing recovery are:: go home   Choice offered to / list presented to : Patient  Discharge Placement                  Name of family member notified: son [name] in room Patient and family notified of of transfer: 01/12/22  Discharge Plan and Services In-house Referral: Clinical Social Work   Post Acute Care Choice: Resumption of Svcs/PTA Provider          DME Arranged: N/A         HH Arranged: PT, RN [place] Agency: [place] Date [place]: 01/11/22   Representative spoke with at [place]: [place]) Interventions     Readmission Risk Interventions    01/07/2022   11:55 AM 12/31/2021    3:22 PM  Readmission Risk Prevention Plan  Transportation Screening Complete Complete  PCP or Specialist Appt within 5-7 Days  Complete  Home Care Screening  Complete  Medication Review (RN CM)  Complete  HRI or Home Care Consult Complete   Social Work Consult for [place] Planning/Counseling Complete   Palliative Care Screening Complete   Medication Review Press photographer) Complete

## 2022-01-12 NOTE — Progress Notes (Signed)
IV removed and discharge instructions reviewed with patient and son.  To follow up with pulmonary and appt made.  Transported by WC to car with oxygen.  Son had home O2 tank in car and was connected .

## 2022-01-13 ENCOUNTER — Inpatient Hospital Stay (HOSPITAL_COMMUNITY)
Admission: EM | Admit: 2022-01-13 | Discharge: 2022-01-25 | DRG: 189 | Disposition: E | Payer: Medicare Other | Attending: Family Medicine | Admitting: Family Medicine

## 2022-01-13 ENCOUNTER — Inpatient Hospital Stay (HOSPITAL_COMMUNITY): Payer: Medicare Other

## 2022-01-13 ENCOUNTER — Emergency Department (HOSPITAL_COMMUNITY): Payer: Medicare Other

## 2022-01-13 ENCOUNTER — Other Ambulatory Visit: Payer: Self-pay

## 2022-01-13 DIAGNOSIS — M47814 Spondylosis without myelopathy or radiculopathy, thoracic region: Secondary | ICD-10-CM | POA: Diagnosis not present

## 2022-01-13 DIAGNOSIS — E782 Mixed hyperlipidemia: Secondary | ICD-10-CM | POA: Diagnosis not present

## 2022-01-13 DIAGNOSIS — J441 Chronic obstructive pulmonary disease with (acute) exacerbation: Secondary | ICD-10-CM | POA: Diagnosis not present

## 2022-01-13 DIAGNOSIS — J690 Pneumonitis due to inhalation of food and vomit: Secondary | ICD-10-CM | POA: Diagnosis not present

## 2022-01-13 DIAGNOSIS — T380X5A Adverse effect of glucocorticoids and synthetic analogues, initial encounter: Secondary | ICD-10-CM | POA: Diagnosis present

## 2022-01-13 DIAGNOSIS — Z794 Long term (current) use of insulin: Secondary | ICD-10-CM

## 2022-01-13 DIAGNOSIS — J9600 Acute respiratory failure, unspecified whether with hypoxia or hypercapnia: Secondary | ICD-10-CM | POA: Diagnosis not present

## 2022-01-13 DIAGNOSIS — J9621 Acute and chronic respiratory failure with hypoxia: Secondary | ICD-10-CM | POA: Diagnosis not present

## 2022-01-13 DIAGNOSIS — I701 Atherosclerosis of renal artery: Secondary | ICD-10-CM | POA: Diagnosis not present

## 2022-01-13 DIAGNOSIS — I10 Essential (primary) hypertension: Secondary | ICD-10-CM | POA: Diagnosis present

## 2022-01-13 DIAGNOSIS — J9601 Acute respiratory failure with hypoxia: Principal | ICD-10-CM

## 2022-01-13 DIAGNOSIS — Z9109 Other allergy status, other than to drugs and biological substances: Secondary | ICD-10-CM

## 2022-01-13 DIAGNOSIS — J9622 Acute and chronic respiratory failure with hypercapnia: Secondary | ICD-10-CM | POA: Diagnosis not present

## 2022-01-13 DIAGNOSIS — R0602 Shortness of breath: Secondary | ICD-10-CM | POA: Diagnosis not present

## 2022-01-13 DIAGNOSIS — M8580 Other specified disorders of bone density and structure, unspecified site: Secondary | ICD-10-CM | POA: Diagnosis present

## 2022-01-13 DIAGNOSIS — E8809 Other disorders of plasma-protein metabolism, not elsewhere classified: Secondary | ICD-10-CM | POA: Diagnosis present

## 2022-01-13 DIAGNOSIS — D6859 Other primary thrombophilia: Secondary | ICD-10-CM | POA: Diagnosis present

## 2022-01-13 DIAGNOSIS — L89151 Pressure ulcer of sacral region, stage 1: Secondary | ICD-10-CM | POA: Diagnosis present

## 2022-01-13 DIAGNOSIS — R1314 Dysphagia, pharyngoesophageal phase: Secondary | ICD-10-CM | POA: Diagnosis not present

## 2022-01-13 DIAGNOSIS — E119 Type 2 diabetes mellitus without complications: Secondary | ICD-10-CM

## 2022-01-13 DIAGNOSIS — E1165 Type 2 diabetes mellitus with hyperglycemia: Secondary | ICD-10-CM | POA: Diagnosis present

## 2022-01-13 DIAGNOSIS — Z87891 Personal history of nicotine dependence: Secondary | ICD-10-CM | POA: Diagnosis not present

## 2022-01-13 DIAGNOSIS — Z808 Family history of malignant neoplasm of other organs or systems: Secondary | ICD-10-CM

## 2022-01-13 DIAGNOSIS — K219 Gastro-esophageal reflux disease without esophagitis: Secondary | ICD-10-CM | POA: Diagnosis present

## 2022-01-13 DIAGNOSIS — I2510 Atherosclerotic heart disease of native coronary artery without angina pectoris: Secondary | ICD-10-CM | POA: Diagnosis not present

## 2022-01-13 DIAGNOSIS — R627 Adult failure to thrive: Secondary | ICD-10-CM | POA: Diagnosis present

## 2022-01-13 DIAGNOSIS — Z79899 Other long term (current) drug therapy: Secondary | ICD-10-CM

## 2022-01-13 DIAGNOSIS — I110 Hypertensive heart disease with heart failure: Secondary | ICD-10-CM | POA: Diagnosis not present

## 2022-01-13 DIAGNOSIS — D6869 Other thrombophilia: Secondary | ICD-10-CM | POA: Diagnosis present

## 2022-01-13 DIAGNOSIS — Z8616 Personal history of COVID-19: Secondary | ICD-10-CM | POA: Diagnosis not present

## 2022-01-13 DIAGNOSIS — J189 Pneumonia, unspecified organism: Secondary | ICD-10-CM | POA: Diagnosis present

## 2022-01-13 DIAGNOSIS — Z86010 Personal history of colonic polyps: Secondary | ICD-10-CM

## 2022-01-13 DIAGNOSIS — Z66 Do not resuscitate: Secondary | ICD-10-CM | POA: Diagnosis not present

## 2022-01-13 DIAGNOSIS — K5730 Diverticulosis of large intestine without perforation or abscess without bleeding: Secondary | ICD-10-CM | POA: Diagnosis not present

## 2022-01-13 DIAGNOSIS — R911 Solitary pulmonary nodule: Secondary | ICD-10-CM | POA: Diagnosis present

## 2022-01-13 DIAGNOSIS — J80 Acute respiratory distress syndrome: Secondary | ICD-10-CM | POA: Diagnosis not present

## 2022-01-13 DIAGNOSIS — Z9981 Dependence on supplemental oxygen: Secondary | ICD-10-CM

## 2022-01-13 DIAGNOSIS — J9690 Respiratory failure, unspecified, unspecified whether with hypoxia or hypercapnia: Secondary | ICD-10-CM | POA: Diagnosis not present

## 2022-01-13 DIAGNOSIS — J4489 Other specified chronic obstructive pulmonary disease: Secondary | ICD-10-CM | POA: Diagnosis present

## 2022-01-13 DIAGNOSIS — R0603 Acute respiratory distress: Secondary | ICD-10-CM | POA: Diagnosis not present

## 2022-01-13 DIAGNOSIS — Z86018 Personal history of other benign neoplasm: Secondary | ICD-10-CM

## 2022-01-13 DIAGNOSIS — R0689 Other abnormalities of breathing: Secondary | ICD-10-CM | POA: Diagnosis not present

## 2022-01-13 DIAGNOSIS — Z7901 Long term (current) use of anticoagulants: Secondary | ICD-10-CM

## 2022-01-13 DIAGNOSIS — Z515 Encounter for palliative care: Secondary | ICD-10-CM | POA: Diagnosis not present

## 2022-01-13 DIAGNOSIS — L8990 Pressure ulcer of unspecified site, unspecified stage: Secondary | ICD-10-CM | POA: Diagnosis present

## 2022-01-13 DIAGNOSIS — J9691 Respiratory failure, unspecified with hypoxia: Secondary | ICD-10-CM | POA: Diagnosis present

## 2022-01-13 DIAGNOSIS — G629 Polyneuropathy, unspecified: Secondary | ICD-10-CM

## 2022-01-13 DIAGNOSIS — U099 Post covid-19 condition, unspecified: Secondary | ICD-10-CM | POA: Diagnosis present

## 2022-01-13 DIAGNOSIS — R069 Unspecified abnormalities of breathing: Secondary | ICD-10-CM | POA: Diagnosis not present

## 2022-01-13 DIAGNOSIS — I959 Hypotension, unspecified: Secondary | ICD-10-CM | POA: Diagnosis not present

## 2022-01-13 DIAGNOSIS — R1310 Dysphagia, unspecified: Secondary | ICD-10-CM

## 2022-01-13 DIAGNOSIS — J9602 Acute respiratory failure with hypercapnia: Secondary | ICD-10-CM | POA: Diagnosis not present

## 2022-01-13 DIAGNOSIS — E1140 Type 2 diabetes mellitus with diabetic neuropathy, unspecified: Secondary | ICD-10-CM | POA: Diagnosis present

## 2022-01-13 DIAGNOSIS — H3530 Unspecified macular degeneration: Secondary | ICD-10-CM | POA: Diagnosis present

## 2022-01-13 DIAGNOSIS — R000 Tachycardia, unspecified: Secondary | ICD-10-CM | POA: Diagnosis not present

## 2022-01-13 DIAGNOSIS — Z6827 Body mass index (BMI) 27.0-27.9, adult: Secondary | ICD-10-CM

## 2022-01-13 DIAGNOSIS — R54 Age-related physical debility: Secondary | ICD-10-CM | POA: Diagnosis present

## 2022-01-13 DIAGNOSIS — Z7189 Other specified counseling: Secondary | ICD-10-CM | POA: Diagnosis not present

## 2022-01-13 DIAGNOSIS — Z8701 Personal history of pneumonia (recurrent): Secondary | ICD-10-CM

## 2022-01-13 DIAGNOSIS — M47816 Spondylosis without myelopathy or radiculopathy, lumbar region: Secondary | ICD-10-CM | POA: Diagnosis not present

## 2022-01-13 DIAGNOSIS — I5032 Chronic diastolic (congestive) heart failure: Secondary | ICD-10-CM | POA: Diagnosis not present

## 2022-01-13 DIAGNOSIS — I82409 Acute embolism and thrombosis of unspecified deep veins of unspecified lower extremity: Secondary | ICD-10-CM | POA: Diagnosis present

## 2022-01-13 DIAGNOSIS — Z86718 Personal history of other venous thrombosis and embolism: Secondary | ICD-10-CM

## 2022-01-13 LAB — COMPREHENSIVE METABOLIC PANEL
ALT: 215 U/L — ABNORMAL HIGH (ref 0–44)
AST: 59 U/L — ABNORMAL HIGH (ref 15–41)
Albumin: 3.7 g/dL (ref 3.5–5.0)
Alkaline Phosphatase: 103 U/L (ref 38–126)
Anion gap: 12 (ref 5–15)
BUN: 32 mg/dL — ABNORMAL HIGH (ref 8–23)
CO2: 24 mmol/L (ref 22–32)
Calcium: 11.1 mg/dL — ABNORMAL HIGH (ref 8.9–10.3)
Chloride: 98 mmol/L (ref 98–111)
Creatinine, Ser: 0.94 mg/dL (ref 0.44–1.00)
GFR, Estimated: 60 mL/min — ABNORMAL LOW (ref >60)
Glucose, Bld: 354 mg/dL — ABNORMAL HIGH (ref 70–99)
Potassium: 4.2 mmol/L (ref 3.5–5.1)
Sodium: 134 mmol/L — ABNORMAL LOW (ref 135–145)
Total Bilirubin: 0.5 mg/dL (ref 0.3–1.2)
Total Protein: 6.9 g/dL (ref 6.5–8.1)

## 2022-01-13 LAB — BLOOD GAS, VENOUS
Acid-base deficit: 0.4 mmol/L (ref 0.0–2.0)
Bicarbonate: 25.4 mmol/L (ref 20.0–28.0)
Drawn by: 66460
FIO2: 48 %
O2 Saturation: 53.5 %
Patient temperature: 36.6
pCO2, Ven: 44 mmHg (ref 44–60)
pH, Ven: 7.37 (ref 7.25–7.43)
pO2, Ven: 36 mmHg (ref 32–45)

## 2022-01-13 LAB — CBC WITH DIFFERENTIAL/PLATELET
Abs Immature Granulocytes: 2.7 10*3/uL — ABNORMAL HIGH (ref 0.00–0.07)
Band Neutrophils: 1 %
Basophils Absolute: 0 10*3/uL (ref 0.0–0.1)
Basophils Relative: 0 %
Eosinophils Absolute: 0 10*3/uL (ref 0.0–0.5)
Eosinophils Relative: 0 %
HCT: 38.2 % (ref 36.0–46.0)
Hemoglobin: 11.9 g/dL — ABNORMAL LOW (ref 12.0–15.0)
Lymphocytes Relative: 6 %
Lymphs Abs: 1.6 10*3/uL (ref 0.7–4.0)
MCH: 27.7 pg (ref 26.0–34.0)
MCHC: 31.2 g/dL (ref 30.0–36.0)
MCV: 88.8 fL (ref 80.0–100.0)
Metamyelocytes Relative: 4 %
Monocytes Absolute: 2.2 10*3/uL — ABNORMAL HIGH (ref 0.1–1.0)
Monocytes Relative: 8 %
Myelocytes: 4 %
Neutro Abs: 20.8 10*3/uL — ABNORMAL HIGH (ref 1.7–7.7)
Neutrophils Relative %: 75 %
Platelets: 269 10*3/uL (ref 150–400)
Promyelocytes Relative: 2 %
RBC: 4.3 MIL/uL (ref 3.87–5.11)
RDW: 23.4 % — ABNORMAL HIGH (ref 11.5–15.5)
WBC: 27.4 10*3/uL — ABNORMAL HIGH (ref 4.0–10.5)
nRBC: 0.1 % (ref 0.0–0.2)

## 2022-01-13 LAB — GLUCOSE, CAPILLARY
Glucose-Capillary: 315 mg/dL — ABNORMAL HIGH (ref 70–99)
Glucose-Capillary: 294 mg/dL — ABNORMAL HIGH (ref 70–99)

## 2022-01-13 MED ORDER — ONDANSETRON HCL 4 MG/2ML IJ SOLN
4.0000 mg | Freq: Four times a day (QID) | INTRAMUSCULAR | Status: DC | PRN
  Administered 2022-01-18: 4 mg via INTRAVENOUS
  Filled 2022-01-13: qty 2

## 2022-01-13 MED ORDER — SODIUM CHLORIDE 0.9 % IV SOLN: INTRAVENOUS | Status: DC

## 2022-01-13 MED ORDER — ORAL CARE MOUTH RINSE
15.0000 mL | OROMUCOSAL | Status: DC
  Administered 2022-01-13 – 2022-01-19 (×20): 15 mL via OROMUCOSAL

## 2022-01-13 MED ORDER — IOHEXOL 350 MG/ML SOLN
100.0000 mL | Freq: Once | INTRAVENOUS | Status: AC | PRN
  Administered 2022-01-13: 100 mL via INTRAVENOUS

## 2022-01-13 MED ORDER — INSULIN ASPART 100 UNIT/ML IJ SOLN
0.0000 [IU] | Freq: Every day | INTRAMUSCULAR | Status: DC
  Administered 2022-01-13: 3 [IU] via SUBCUTANEOUS
  Administered 2022-01-14: 2 [IU] via SUBCUTANEOUS
  Administered 2022-01-15: 3 [IU] via SUBCUTANEOUS

## 2022-01-13 MED ORDER — DM-GUAIFENESIN ER 30-600 MG PO TB12
1.0000 | ORAL_TABLET | Freq: Two times a day (BID) | ORAL | Status: DC
  Administered 2022-01-13 – 2022-01-18 (×11): 1 via ORAL
  Filled 2022-01-13 (×11): qty 1

## 2022-01-13 MED ORDER — ACETAMINOPHEN 650 MG RE SUPP: 650.0000 mg | Freq: Four times a day (QID) | RECTAL | Status: DC | PRN

## 2022-01-13 MED ORDER — ACETAMINOPHEN 325 MG PO TABS
650.0000 mg | ORAL_TABLET | Freq: Four times a day (QID) | ORAL | Status: DC | PRN
  Administered 2022-01-13 – 2022-01-14 (×2): 650 mg via ORAL
  Filled 2022-01-13 (×2): qty 2

## 2022-01-13 MED ORDER — POLYETHYLENE GLYCOL 3350 17 G PO PACK: 17.0000 g | PACK | Freq: Every day | ORAL | Status: DC | PRN

## 2022-01-13 MED ORDER — SODIUM CHLORIDE 0.9 % IV SOLN: INTRAVENOUS | Status: DC | PRN

## 2022-01-13 MED ORDER — FERROUS SULFATE 325 (65 FE) MG PO TABS
325.0000 mg | ORAL_TABLET | Freq: Every day | ORAL | Status: DC
  Administered 2022-01-14 – 2022-01-18 (×5): 325 mg via ORAL
  Filled 2022-01-13 (×5): qty 1

## 2022-01-13 MED ORDER — BISACODYL 10 MG RE SUPP: 10.0000 mg | Freq: Every day | RECTAL | Status: DC | PRN

## 2022-01-13 MED ORDER — ONDANSETRON HCL 4 MG PO TABS: 4.0000 mg | ORAL_TABLET | Freq: Four times a day (QID) | ORAL | Status: DC | PRN

## 2022-01-13 MED ORDER — SODIUM CHLORIDE 0.9% FLUSH: 3.0000 mL | INTRAVENOUS | Status: DC | PRN

## 2022-01-13 MED ORDER — INSULIN GLARGINE-YFGN 100 UNIT/ML ~~LOC~~ SOLN
28.0000 [IU] | Freq: Every day | SUBCUTANEOUS | Status: DC
  Administered 2022-01-13 – 2022-01-15 (×3): 28 [IU] via SUBCUTANEOUS
  Filled 2022-01-13 (×5): qty 0.28

## 2022-01-13 MED ORDER — SENNOSIDES-DOCUSATE SODIUM 8.6-50 MG PO TABS
2.0000 | ORAL_TABLET | Freq: Every day | ORAL | Status: DC
  Administered 2022-01-13 – 2022-01-17 (×5): 2 via ORAL
  Filled 2022-01-13 (×5): qty 2

## 2022-01-13 MED ORDER — SODIUM CHLORIDE 0.9% FLUSH
3.0000 mL | Freq: Two times a day (BID) | INTRAVENOUS | Status: DC
  Administered 2022-01-13 – 2022-01-18 (×9): 3 mL via INTRAVENOUS

## 2022-01-13 MED ORDER — BUDESON-GLYCOPYRROL-FORMOTEROL 160-9-4.8 MCG/ACT IN AERO: 2.0000 | INHALATION_SPRAY | Freq: Every day | RESPIRATORY_TRACT | Status: DC

## 2022-01-13 MED ORDER — GABAPENTIN 100 MG PO CAPS
100.0000 mg | ORAL_CAPSULE | Freq: Three times a day (TID) | ORAL | Status: DC
  Administered 2022-01-13 – 2022-01-18 (×15): 100 mg via ORAL
  Filled 2022-01-13 (×15): qty 1

## 2022-01-13 MED ORDER — ALBUTEROL SULFATE (2.5 MG/3ML) 0.083% IN NEBU
2.5000 mg | INHALATION_SOLUTION | RESPIRATORY_TRACT | Status: DC | PRN
  Administered 2022-01-16: 2.5 mg via RESPIRATORY_TRACT
  Filled 2022-01-13: qty 3

## 2022-01-13 MED ORDER — METHYLPREDNISOLONE SODIUM SUCC 40 MG IJ SOLR
40.0000 mg | Freq: Two times a day (BID) | INTRAMUSCULAR | Status: DC
  Administered 2022-01-13 – 2022-01-17 (×8): 40 mg via INTRAVENOUS
  Filled 2022-01-13 (×8): qty 1

## 2022-01-13 MED ORDER — METOPROLOL TARTRATE 50 MG PO TABS
50.0000 mg | ORAL_TABLET | Freq: Two times a day (BID) | ORAL | Status: DC
  Administered 2022-01-14 – 2022-01-18 (×9): 50 mg via ORAL
  Filled 2022-01-13 (×9): qty 1

## 2022-01-13 MED ORDER — POLYETHYLENE GLYCOL 3350 17 G PO PACK
17.0000 g | PACK | Freq: Every day | ORAL | Status: DC
  Administered 2022-01-14 – 2022-01-18 (×5): 17 g via ORAL
  Filled 2022-01-13 (×5): qty 1

## 2022-01-13 MED ORDER — UMECLIDINIUM BROMIDE 62.5 MCG/ACT IN AEPB
1.0000 | INHALATION_SPRAY | Freq: Every day | RESPIRATORY_TRACT | Status: DC
  Administered 2022-01-14: 1 via RESPIRATORY_TRACT
  Filled 2022-01-13: qty 7

## 2022-01-13 MED ORDER — MOMETASONE FURO-FORMOTEROL FUM 100-5 MCG/ACT IN AERO
2.0000 | INHALATION_SPRAY | Freq: Two times a day (BID) | RESPIRATORY_TRACT | Status: DC
  Administered 2022-01-13 – 2022-01-15 (×4): 2 via RESPIRATORY_TRACT
  Filled 2022-01-13: qty 8.8

## 2022-01-13 MED ORDER — CHLORHEXIDINE GLUCONATE CLOTH 2 % EX PADS
6.0000 | MEDICATED_PAD | Freq: Every day | CUTANEOUS | Status: DC
  Administered 2022-01-13 – 2022-01-15 (×3): 6 via TOPICAL

## 2022-01-13 MED ORDER — AMOXICILLIN-POT CLAVULANATE 875-125 MG PO TABS
1.0000 | ORAL_TABLET | Freq: Two times a day (BID) | ORAL | Status: DC
  Administered 2022-01-13 – 2022-01-18 (×11): 1 via ORAL
  Filled 2022-01-13 (×11): qty 1

## 2022-01-13 MED ORDER — FLUCONAZOLE 100 MG PO TABS
200.0000 mg | ORAL_TABLET | Freq: Every day | ORAL | Status: AC
  Administered 2022-01-14 – 2022-01-17 (×4): 200 mg via ORAL
  Filled 2022-01-13 (×4): qty 2

## 2022-01-13 MED ORDER — TRAZODONE HCL 50 MG PO TABS: 50.0000 mg | ORAL_TABLET | Freq: Every evening | ORAL | Status: DC | PRN

## 2022-01-13 MED ORDER — PANTOPRAZOLE SODIUM 40 MG PO TBEC
40.0000 mg | DELAYED_RELEASE_TABLET | Freq: Every day | ORAL | Status: DC
  Administered 2022-01-14 – 2022-01-18 (×5): 40 mg via ORAL
  Filled 2022-01-13 (×5): qty 1

## 2022-01-13 MED ORDER — APIXABAN 5 MG PO TABS
5.0000 mg | ORAL_TABLET | Freq: Two times a day (BID) | ORAL | Status: DC
  Administered 2022-01-14 – 2022-01-18 (×9): 5 mg via ORAL
  Filled 2022-01-13 (×9): qty 1

## 2022-01-13 MED ORDER — IPRATROPIUM-ALBUTEROL 0.5-2.5 (3) MG/3ML IN SOLN
3.0000 mL | Freq: Four times a day (QID) | RESPIRATORY_TRACT | Status: DC
  Administered 2022-01-13 – 2022-01-15 (×8): 3 mL via RESPIRATORY_TRACT
  Filled 2022-01-13 (×8): qty 3

## 2022-01-13 MED ORDER — POLYVINYL ALCOHOL 1.4 % OP SOLN
2.0000 [drp] | Freq: Four times a day (QID) | OPHTHALMIC | Status: DC
  Administered 2022-01-13 – 2022-01-18 (×21): 2 [drp] via OPHTHALMIC
  Filled 2022-01-13 (×4): qty 15

## 2022-01-13 MED ORDER — ATORVASTATIN CALCIUM 40 MG PO TABS
80.0000 mg | ORAL_TABLET | Freq: Every day | ORAL | Status: DC
  Administered 2022-01-14 – 2022-01-18 (×5): 80 mg via ORAL
  Filled 2022-01-13 (×5): qty 2

## 2022-01-13 MED ORDER — INSULIN ASPART 100 UNIT/ML IJ SOLN
0.0000 [IU] | Freq: Three times a day (TID) | INTRAMUSCULAR | Status: DC
  Administered 2022-01-13: 15 [IU] via SUBCUTANEOUS
  Administered 2022-01-14 (×2): 11 [IU] via SUBCUTANEOUS
  Administered 2022-01-14: 3 [IU] via SUBCUTANEOUS
  Administered 2022-01-15: 4 [IU] via SUBCUTANEOUS
  Administered 2022-01-15: 3 [IU] via SUBCUTANEOUS
  Administered 2022-01-15: 7 [IU] via SUBCUTANEOUS
  Administered 2022-01-16: 4 [IU] via SUBCUTANEOUS
  Administered 2022-01-16: 7 [IU] via SUBCUTANEOUS
  Administered 2022-01-16: 4 [IU] via SUBCUTANEOUS
  Administered 2022-01-17: 7 [IU] via SUBCUTANEOUS
  Administered 2022-01-17: 20 [IU] via SUBCUTANEOUS
  Administered 2022-01-17: 11 [IU] via SUBCUTANEOUS
  Administered 2022-01-18 (×2): 3 [IU] via SUBCUTANEOUS

## 2022-01-13 MED ORDER — SODIUM CHLORIDE 0.9% FLUSH
3.0000 mL | Freq: Two times a day (BID) | INTRAVENOUS | Status: DC
  Administered 2022-01-13 – 2022-01-18 (×7): 3 mL via INTRAVENOUS

## 2022-01-13 MED ORDER — ALBUTEROL SULFATE HFA 108 (90 BASE) MCG/ACT IN AERS: 2.0000 | INHALATION_SPRAY | RESPIRATORY_TRACT | Status: DC | PRN

## 2022-01-13 NOTE — Progress Notes (Signed)
Patient was initially placed on Airborne precautions due to history of Covid but conversation with Dr. Denton Brick and precautions were removed.

## 2022-01-13 NOTE — ED Notes (Signed)
EDP assess pt at bedside during triage

## 2022-01-13 NOTE — ED Provider Notes (Signed)
[facility] EMERGENCY DEPARTMENT Provider Note   CSN: [id number] Arrival date & time: 12/28/2021  0945     History  Chief Complaint  Patient presents with   Respiratory Distress    Michaela Moon is a 86 y.o. female.  Brought in by EMS.  Son who is her caregiver called for low oxygen sats.  Apparently patient was home on 5 L of nasal cannula and sats were very low.  On 10 L nasal can apparently 80%.  EMS started on nonrebreather which got her up to 87% best.  We were kind of looking at 84 when we evaluated her.  She was talking and seemed to be mentating fine.  Patient just discharged from the hospital yesterday after being in for a week.  Patient known to have significant respiratory problems.  Patient also diagnosed with COVID on November 10.  And there is also been questions raised for aspiration pneumonia.  Patient's recent hospitalization reviewed.  Past medical history of her hypertension hyperlipidemia diabetes chronic struct of pulmonary disease.  Is a former smoker quit in 2004.       Home Medications Prior to Admission medications   Medication Sig Start Date End Date Taking? Authorizing Provider  acetaminophen (TYLENOL) 325 MG tablet Take 2 tablets (650 mg total) by mouth every 6 (six) hours as needed for mild pain (or Fever >/= 101). 01/12/22   Roxan Hockey, MD  albuterol (PROVENTIL) (2.5 MG/3ML) 0.083% nebulizer solution Take 3 mLs (2.5 mg total) by nebulization every 6 (six) hours as needed for wheezing or shortness of breath. 01/12/22   Roxan Hockey, MD  albuterol-ipratropium (COMBIVENT) 18-103 MCG/ACT inhaler Inhale 2 puffs into the lungs every 6 (six) hours as needed for wheezing. 01/12/22   Roxan Hockey, MD  amoxicillin-clavulanate (AUGMENTIN) 875-125 MG tablet Take 1 tablet by mouth 2 (two) times daily for 5 days. 01/12/22 01/17/22  Roxan Hockey, MD  apixaban (ELIQUIS) 5 MG TABS tablet Take 1 tablet (5 mg total) by mouth 2 (two) times daily. 01/12/22    Roxan Hockey, MD  atorvastatin (LIPITOR) 80 MG tablet ALTERNATE TAKING 1 TABLET EVERY OTHER DAY WITH 1/2 TABLET EVERY OTHERDAY 01/13/14   Chipper Herb, MD  Biotin w/ Vitamins C & E (HAIR/SKIN/NAILS PO) Take 1 tablet by mouth daily. Takes once a day.    [provider]  Budeson-Glycopyrrol-Formoterol (BREZTRI AEROSPHERE) 160-9-4.8 MCG/ACT AERO Inhale 2 puffs into the lungs in the morning and at bedtime. 03/08/21   Chesley Mires, MD  dextromethorphan-guaiFENesin (MUCINEX DM) 30-600 MG 12hr tablet Take 1 tablet by mouth 2 (two) times daily for 10 days. 01/12/22 01/22/22  Roxan Hockey, MD  docusate sodium (COLACE) 100 MG capsule Take 1 capsule (100 mg total) by mouth 2 (two) times daily. 01/12/22 01/12/23  Roxan Hockey, MD  ferrous sulfate 325 (65 FE) MG tablet Take 1 tablet (325 mg total) by mouth daily. 12/04/21 12/04/22  Manuella Ghazi, Pratik D, DO  fluconazole (DIFLUCAN) 200 MG tablet Take 1 tablet (200 mg total) by mouth daily. 01/12/22   Roxan Hockey, MD  fluticasone (FLONASE) 50 MCG/ACT nasal spray Place 1 spray into both nostrils daily. 10/19/20   Chesley Mires, MD  gabapentin (NEURONTIN) 100 MG capsule Take 1 capsule (100 mg total) by mouth 3 (three) times daily. 12/20/21   Johnson, Clanford L, MD  glimepiride (AMARYL) 2 MG tablet Take 4 mg by mouth daily with breakfast.     [provider]  guaiFENesin (MUCINEX) 600 MG 12 hr tablet Take 2  tablets (1,200 mg total) by mouth 2 (two) times daily as needed for cough or to loosen phlegm. 01/12/22   Roxan Hockey, MD  Hypromellose (ARTIFICIAL TEARS OP) Place 1 drop into both eyes 4 (four) times daily.    [provider]  LANTUS SOLOSTAR 100 UNIT/ML Solostar Pen Inject 20 Units into the skin in the morning and at bedtime. 11/22/20   [provider]  losartan-hydrochlorothiazide (HYZAAR) 100-12.5 MG per tablet TAKE ONE (1) TABLET EACH DAY Patient taking differently: Take 1 tablet by mouth daily. 03/09/14   Chipper Herb, MD  metoprolol (LOPRESSOR) 50 MG tablet Take 50 mg by mouth 2 (two) times daily.    [provider]  montelukast (SINGULAIR) 10 MG tablet TAKE ONE TABLET BY MOUTH AT BEDTIME 07/30/21   Chesley Mires, MD  Multiple Vitamins-Minerals (CENTRUM SILVER PO) Take 1 tablet by mouth daily.    [provider]  Omega-3 Fatty Acids (FISH OIL) 1000 MG CAPS Take 1,000 mg by mouth in the morning, at noon, and at bedtime.    [provider]  ondansetron (ZOFRAN) 4 MG tablet Take 1 tablet (4 mg total) by mouth every 6 (six) hours as needed for nausea. 01/12/22   Roxan Hockey, MD  oxybutynin (DITROPAN) 5 MG tablet Take 5 mg by mouth every 8 (eight) hours as needed for bladder spasms.     [provider]  pantoprazole (PROTONIX) 40 MG tablet Take 1 tablet (40 mg total) by mouth daily. 01/12/22   Roxan Hockey, MD  polyethylene glycol (MIRALAX / GLYCOLAX) 17 g packet Take 17 g by mouth daily as needed for mild constipation. 12/20/21   Johnson, Clanford L, MD  predniSONE (DELTASONE) 10 MG tablet Take 1 tablet (10 mg total) by mouth daily with breakfast. Take 40 mg (4 tab) daily for 3 days , then 30 mg (3 Tab) daily for 3 days, then Take 20 mg (2 Tab) daily for 3 days, then Take 10 mg (10 mg) daily for 3 days... Then STOP 01/12/22   Roxan Hockey, MD  sitaGLIPtin-metformin (JANUMET) 50-1000 MG tablet Take 1 tablet by mouth 2 (two) times daily with a meal.    [provider]  triamcinolone cream (KENALOG) 0.1 % Apply 1 application topically daily as needed (eczema).    [provider]      Allergies    Onion    Review of Systems   Review of Systems  Constitutional:  Negative for chills and fever.  HENT:  Negative for rhinorrhea and sore throat.   Eyes:  Negative for visual disturbance.  Respiratory:  Positive for shortness of breath. Negative for cough.   Cardiovascular:  Negative for chest pain and leg swelling.  Gastrointestinal:  Negative for  abdominal pain, diarrhea, nausea and vomiting.  Genitourinary:  Negative for dysuria.  Musculoskeletal:  Negative for back pain and neck pain.  Skin:  Negative for rash.  Neurological:  Negative for dizziness, light-headedness and headaches.  Hematological:  Does not bruise/bleed easily.  Psychiatric/Behavioral:  Negative for confusion.     Physical Exam Updated Vital Signs BP (!) 118/55   Pulse (!) 115   Temp 98.1 F (36.7 C) (Axillary)   Resp 15   Ht 1.651 m ('5\' 5"'$ )   Wt 74.5 kg   SpO2 (!) 87%   BMI 27.33 kg/m  Physical Exam Vitals and nursing note reviewed.  Constitutional:      General: She is in acute distress.     Appearance: Normal appearance.  She is well-developed.  HENT:     Head: Normocephalic and atraumatic.  Eyes:     Extraocular Movements: Extraocular movements intact.     Conjunctiva/sclera: Conjunctivae normal.     Pupils: Pupils are equal, round, and reactive to light.  Cardiovascular:     Rate and Rhythm: Normal rate and regular rhythm.     Heart sounds: No murmur heard. Pulmonary:     Effort: Respiratory distress present.     Breath sounds: Rales present. No wheezing or rhonchi.  Chest:     Chest wall: No tenderness.  Abdominal:     Palpations: Abdomen is soft.     Tenderness: There is no abdominal tenderness.  Musculoskeletal:        General: Swelling present.     Cervical back: Neck supple.     Comments: Slight bilateral leg edema.  Skin:    General: Skin is warm and dry.     Capillary Refill: Capillary refill takes less than 2 seconds.  Neurological:     General: No focal deficit present.     Mental Status: She is alert and oriented to person, place, and time.     Cranial Nerves: No cranial nerve deficit.     Sensory: No sensory deficit.     Motor: No weakness.  Psychiatric:        Mood and Affect: Mood normal.     ED Results / Procedures / Treatments   Labs (all labs ordered are listed, but only abnormal results are displayed) Labs  Reviewed  COMPREHENSIVE METABOLIC PANEL  CBC WITH DIFFERENTIAL/PLATELET  BLOOD GAS, VENOUS    EKG None  Radiology No results found.  Procedures Procedures    Medications Ordered in ED Medications  albuterol (VENTOLIN HFA) 108 (90 Base) MCG/ACT inhaler 2 puff (has no administration in time range)    ED Course/ Medical Decision Making/ A&P                           Medical Decision Making Amount and/or Complexity of Data Reviewed Labs: ordered. Radiology: ordered.  Risk Prescription drug management. Decision regarding hospitalization.   CRITICAL CARE Performed by: Fredia Sorrow Total critical care time: 45 minutes Critical care time was exclusive of separately billable procedures and treating other patients. Critical care was necessary to treat or prevent imminent or life-threatening deterioration. Critical care was time spent personally by me on the following activities: development of treatment plan with patient and/or surrogate as well as nursing, discussions with consultants, evaluation of patient's response to treatment, examination of patient, obtaining history from patient or surrogate, ordering and performing treatments and interventions, ordering and review of laboratory studies, ordering and review of radiographic studies, pulse oximetry and re-evaluation of patient's condition.  Patient arrived in significant respiratory distress due to hypoxia already on 15 L nonrebreather.  Oxygen sats around 84%.  Apparently home much lower.  Patient just discharged yesterday after being in the hospital for a week.  On November 10 patient diagnosed with COVID she is also known to have aspiration pneumonia problems.  Patient's white count today is much higher than it was just yesterday from discharge.  In discussion with respiratory they felt the patient would not do well on high flow so we put her on BiPAP to give her assistance.  The patient was talking on the 100%  nonrebreather.  Mentating fine.  Oxygen sats just low.  Patient doing much better on the BiPAP.  Chest  x-ray showing multifocal pneumonia.  Could be component of aspiration discussed with the hospitalist who will readmit.  Some chart review shows that patient is a DNR.  Discussed with admitting hospitalist and he thinks that the patient is.  Patient's blood pressure fine.  Complete metabolic panel sodium 118 potassium 4.2 GFR 60 liver function test ALT 215 total bili normal anion gap normal CBC white count 27,000.  Up from 17,000 hemoglobin 11.9 CT angio chest ordered by the admitting team.  EKG without acute changes.  Patient with tachycardia.  Final Clinical Impression(s) / ED Diagnoses Final diagnoses:  Acute respiratory failure with hypoxia [facility])    Rx / DC Orders ED Discharge Orders     None         Fredia Sorrow, MD 01/05/2022 1152

## 2022-01-13 NOTE — ED Notes (Signed)
Patient transported to CT 

## 2022-01-13 NOTE — ED Triage Notes (Signed)
Pt arrived [facility] for respiratory distress. Pt was 80% on 10 lpm. Pt is now 15 lpm with non rebreather at 87%.

## 2022-01-13 NOTE — ED Notes (Addendum)
Pt returned from [place] with RN, RT and Radiology Transporter.

## 2022-01-13 NOTE — ED Notes (Signed)
DNR bracelets on pt's right wrist.

## 2022-01-13 NOTE — H&P (Addendum)
Patient Demographics:    Markela Wee, is a 86 y.o. female  MRN: [id number]   DOB - 12/10/1935  Admit Date - 01/18/2022  Outpatient Primary MD for the patient is [name], MD   Assessment & Plan:   -Assessment and Plan: 1)Acute on chronic respiratory failure with hypoxia -suspect recurrent aspiration episodes and COPD exacerbation -CT angio chest shows  Extensive bilateral heterogeneous and consolidative airspace Opacities -currently requiring BiPAP for worsening hypoxia and increased work of breathing -Recent MRSA PCR negative --Continue bronchodilators and mucolytics -Treat with IV Solu-Medrol, continue augmentin and fluconazole. -Patient appears to have significant recurrent respiratory problems since she suffered COVID back in September 2023  2)Dysphagia -With a high risk for aspiration -Continue dysphagia 2 diet with nectar thick liquids. -Education provided for upright position during meals and after meals to continue minimizing chance of aspiration. -[name] reports having hospital bed for her at home.   3)H/o Bil DVT-lower extremity venous Dopplers from 12/08/2021 noted -CTA angio chest from 01/22/2022, 12/08/2021 and repeat CTA chest 12/25/2021 without acute PE -Continue Eliquis twice daily   4)Chronic Diastolic HF (Heart Failure) (HCC) -No vascular congestion appreciated on x-ray or CT scan -Continue to follow daily weights and strict I's and O's -Continue home medication regimen. -Maintain adequate hydration. -Patient chronically on HCTZ. -Continue to follow urine output.   5)Insulin dependent type 2 diabetes mellitus (HCC) --Recent A1c 6.3-reflecting excellent diabetic control PTA -Anticipate steroid-induced hyperglycemia -continue  levemir Use Novolog/Humalog Sliding scale  insulin with Accu-Cheks/Fingersticks as ordered   6) social/ethics--- advanced directives discussed with patient and son, patient request DNR/DNI status   Pressure injury of skin--POA -No signs of superimposed infection -Provide preventive measures and constant repositioning.   GERD (gastroesophageal reflux disease) -Continue PPI -Dose adjusted to twice a day while receiving steroids for further GI prophylaxis.   Mixed hyperlipidemia -Continue statin   -HTN-c/n  metoprolol 50 mg twice daily    Disposition: Anticipate that discharge home with home Vs SNF rehab    Status is: Inpatient    Disposition: The patient is from: Home              Anticipated d/c is to: Home with HH Vs SNF              Anticipated d/c date is: 3 days              Patient currently is not medically stable to d/c. Barriers: Not Clinically Stable-   With History of - Reviewed by me  Past Medical History:  Diagnosis Date   Arthritis    Asthma    COPD (chronic obstructive pulmonary disease) ([place])    Diabetes mellitus without complication (HCC)    GERD (gastroesophageal reflux disease)    Headache    Hyperlipidemia    Hypertension    Macular degeneration    Neuropathy    Obesity    Osteopenia    Stress incontinence  Past Surgical History:  Procedure Laterality Date   BREAST LUMPECTOMY Bilateral    no cancer   CARPAL TUNNEL RELEASE Left 10/23/2017   Procedure: LEFT CARPAL TUNNEL RELEASE;  Surgeon: Carole Civil, MD;  Location: [facility];  Service: Orthopedics;  Laterality: Left;   CARPAL TUNNEL RELEASE Right 11/20/2017   Procedure: CARPAL TUNNEL RELEASE;  Surgeon: Carole Civil, MD;  Location: [facility];  Service: Orthopedics;  Laterality: Right;   CATARACT EXTRACTION W/PHACO Left 11/01/2016   Procedure: CATARACT EXTRACTION PHACO AND INTRAOCULAR LENS PLACEMENT (IOC);  Surgeon: Baruch Goldmann, MD;  Location: [facility];  Service: Ophthalmology;  Laterality: Left;  CDE: 3.83   CATARACT  EXTRACTION W/PHACO Right 11/29/2016   Procedure: CATARACT EXTRACTION PHACO AND INTRAOCULAR LENS PLACEMENT RIGHT EYE;  Surgeon: Baruch Goldmann, MD;  Location: [facility];  Service: Ophthalmology;  Laterality: Right;  CDE: 8.71   colon tumor removed  2010   Dr. 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 submucosal lipoma on path   COLONOSCOPY  05/2013   Dr. Anthony Sar: normal. (h/o colon polyps)   COLONOSCOPY  2011   normal   COLONOSCOPY  10/2003   Dr. Lindalou Hose: large polypoid mass hepatic fluexure   ESOPHAGOGASTRODUODENOSCOPY N/A 04/29/2016   Procedure: ESOPHAGOGASTRODUODENOSCOPY (EGD);  Surgeon: Danie Binder, MD;  Location: [facility];  Service: Endoscopy;  Laterality: N/A;  9:15am   SAVORY DILATION N/A 04/29/2016   Procedure: SAVORY DILATION;  Surgeon: Danie Binder, MD;  Location: [facility];  Service: Endoscopy;  Laterality: N/A;   Chief Complaint  Patient presents with   Respiratory Distress      HPI:    Yazmin Locher  is a 86 y.o. female who is a reformed smoker (quit in 2004 )with medical history significant of COPD, diabetes, hypertension, lower extremity DVT (on chronic Eliquis), diagnosis of COVID in on 10/31/21 and again on 01/04/22 (after testing negative on 11/28/2021 and 12/07/2021) and recurrent hospitalization for aspiration pneumonia and acute on chronic respiratory failure---chronically on 5 L nasal cannula supplementation  -Discharged home on 01/12/2022 -Readmitted on 01/08/2022 with acute on chronic hypoxic respiratory failure As per pt's son she did okay in the evening of 01/12/2022-----this morning she became acutely short of breath, she was found to be very hypoxic, EMS was called and she was placed on BiPAP - Patient appears to have significant recurrent respiratory problems since she suffered COVID back in September 2023  -As per [facility] for respiratory distress. Pt was 80% on 10 lpm. Pt is now 15 lpm with non rebreather at 87%  - At  the time of my evaluation patient is on continuous BiPAP, respiratory therapist at bedside -Additional history obtained from patient's son - In the ED CTA chest abdomen and pelvis shows--Extensive bilateral heterogeneous and consolidative airspace opacities,   Patient denies chest pains, EKG sinus rhythm without acute changes, troponin 10 -Creatinine 0.61 -WBC 11.3, Hgb 10.0 platelets 199   Review of systems:    In addition to the HPI above,   A full Review of  Systems was done, all other systems reviewed are negative except as noted above in HPI , .    Social History:  Reviewed by me    Social History   Tobacco Use   Smoking status: Former    Packs/day: 2.00    Years: 20.00    Total pack years: 40.00    Types: Cigarettes    Quit date: 02/25/2002    Years since quitting:  19.8   Smokeless tobacco: Never  Substance Use Topics   Alcohol use: No    Family History :  Reviewed by me    Family History  Problem Relation Age of Onset   Melanoma Brother    Colon cancer Neg Hx      Home Medications:   Prior to Admission medications   Medication Sig Start Date End Date Taking? Authorizing Provider  acetaminophen (TYLENOL) 325 MG tablet Take 2 tablets (650 mg total) by mouth every 6 (six) hours as needed for mild pain (or Fever >/= 101). 01/12/22   Roxan Hockey, MD  albuterol (PROVENTIL) (2.5 MG/3ML) 0.083% nebulizer solution Take 3 mLs (2.5 mg total) by nebulization every 6 (six) hours as needed for wheezing or shortness of breath. 01/12/22   Roxan Hockey, MD  albuterol-ipratropium (COMBIVENT) 18-103 MCG/ACT inhaler Inhale 2 puffs into the lungs every 6 (six) hours as needed for wheezing. 01/12/22   Roxan Hockey, MD  amoxicillin-clavulanate (AUGMENTIN) 875-125 MG tablet Take 1 tablet by mouth 2 (two) times daily for 5 days. 01/12/22 01/17/22  Roxan Hockey, MD  apixaban (ELIQUIS) 5 MG TABS tablet Take 1 tablet (5 mg total) by mouth 2 (two) times daily. 01/12/22    Roxan Hockey, MD  atorvastatin (LIPITOR) 80 MG tablet ALTERNATE TAKING 1 TABLET EVERY OTHER DAY WITH 1/2 TABLET EVERY OTHERDAY 01/13/14   Chipper Herb, MD  Biotin w/ Vitamins C & E (HAIR/SKIN/NAILS PO) Take 1 tablet by mouth daily. Takes once a day.    [provider]  Budeson-Glycopyrrol-Formoterol (BREZTRI AEROSPHERE) 160-9-4.8 MCG/ACT AERO Inhale 2 puffs into the lungs in the morning and at bedtime. 03/08/21   Chesley Mires, MD  dextromethorphan-guaiFENesin (MUCINEX DM) 30-600 MG 12hr tablet Take 1 tablet by mouth 2 (two) times daily for 10 days. 01/12/22 01/22/22  Roxan Hockey, MD  docusate sodium (COLACE) 100 MG capsule Take 1 capsule (100 mg total) by mouth 2 (two) times daily. 01/12/22 01/12/23  Roxan Hockey, MD  ferrous sulfate 325 (65 FE) MG tablet Take 1 tablet (325 mg total) by mouth daily. 12/04/21 12/04/22  Manuella Ghazi, Pratik D, DO  fluconazole (DIFLUCAN) 200 MG tablet Take 1 tablet (200 mg total) by mouth daily. 01/12/22   Roxan Hockey, MD  fluticasone (FLONASE) 50 MCG/ACT nasal spray Place 1 spray into both nostrils daily. 10/19/20   Chesley Mires, MD  gabapentin (NEURONTIN) 100 MG capsule Take 1 capsule (100 mg total) by mouth 3 (three) times daily. 12/20/21   Johnson, Clanford L, MD  glimepiride (AMARYL) 2 MG tablet Take 4 mg by mouth daily with breakfast.     [provider]  guaiFENesin (MUCINEX) 600 MG 12 hr tablet Take 2 tablets (1,200 mg total) by mouth 2 (two) times daily as needed for cough or to loosen phlegm. 01/12/22   Roxan Hockey, MD  Hypromellose (ARTIFICIAL TEARS OP) Place 1 drop into both eyes 4 (four) times daily.    [provider]  LANTUS SOLOSTAR 100 UNIT/ML Solostar Pen Inject 20 Units into the skin in the morning and at bedtime. 11/22/20   [provider]  losartan-hydrochlorothiazide (HYZAAR) 100-12.5 MG per tablet TAKE ONE (1) TABLET EACH DAY Patient taking differently: Take 1 tablet by mouth daily. 03/09/14   Chipper Herb, MD  metoprolol (LOPRESSOR) 50 MG tablet Take 50 mg by mouth 2 (two) times daily.    [provider]  montelukast (SINGULAIR) 10 MG tablet TAKE ONE TABLET BY MOUTH AT BEDTIME 07/30/21   Sood, [place],  MD  Multiple Vitamins-Minerals (CENTRUM SILVER PO) Take 1 tablet by mouth daily.    [provider]  Omega-3 Fatty Acids (FISH OIL) 1000 MG CAPS Take 1,000 mg by mouth in the morning, at noon, and at bedtime.    [provider]  ondansetron (ZOFRAN) 4 MG tablet Take 1 tablet (4 mg total) by mouth every 6 (six) hours as needed for nausea. 01/12/22   Roxan Hockey, MD  oxybutynin (DITROPAN) 5 MG tablet Take 5 mg by mouth every 8 (eight) hours as needed for bladder spasms.     [provider]  pantoprazole (PROTONIX) 40 MG tablet Take 1 tablet (40 mg total) by mouth daily. 01/12/22   Roxan Hockey, MD  polyethylene glycol (MIRALAX / GLYCOLAX) 17 g packet Take 17 g by mouth daily as needed for mild constipation. 12/20/21   Johnson, Clanford L, MD  predniSONE (DELTASONE) 10 MG tablet Take 1 tablet (10 mg total) by mouth daily with breakfast. Take 40 mg (4 tab) daily for 3 days , then 30 mg (3 Tab) daily for 3 days, then Take 20 mg (2 Tab) daily for 3 days, then Take 10 mg (10 mg) daily for 3 days... Then STOP 01/12/22   Roxan Hockey, MD  sitaGLIPtin-metformin (JANUMET) 50-1000 MG tablet Take 1 tablet by mouth 2 (two) times daily with a meal.    [provider]  triamcinolone cream (KENALOG) 0.1 % Apply 1 application topically daily as needed (eczema).    [provider]     Allergies:     Allergies  Allergen Reactions   Onion     wheezing     Physical Exam:   Vitals  Blood pressure (!) 130/40, pulse (!) 103, temperature (!) 97.5 F (36.4 C), temperature source Oral, resp. rate 18, height '5\' 5"'$  (1.651 m), weight 75.7 kg, SpO2 92 %.  Physical Examination: General appearance - alert, acute resp distress and conversational  dyspnea  mental status - alert, oriented to person, place, and time,  Nose- Bipap mask  Eyes - sclera anicteric Neck - supple, no JVD elevation , Chest -breath sounds with scattered wheezes and rhonchi bilaterally Heart - S1 and S2 normal, regular  Abdomen - soft, nontender, nondistended, +BS, increased truncal adiposity Neurological - screening mental status exam normal, neck supple without rigidity, cranial nerves II through XII intact, DTR's normal and symmetric Extremities - no difficult pedal edema noted, intact peripheral pulses  Skin - warm, dry    Data Review:    CBC Recent Labs  Lab 01/09/22 0349 01/11/22 0329 01/15/2022 1004  WBC 9.7 11.3* 27.4*  HGB 9.6* 10.1* 11.9*  HCT 31.0* 32.0* 38.2  PLT 165 199 269  MCV 88.3 87.4 88.8  MCH 27.4 27.6 27.7  MCHC 31.0 31.6 31.2  RDW 23.7* 23.2* 23.4*  LYMPHSABS  --   --  1.6  MONOABS  --   --  2.2*  EOSABS  --   --  0.0  BASOSABS  --   --  0.0   ------------------------------------------------------------------------------------------------------------------  Chemistries  Recent Labs  Lab 01/09/22 0349 01/11/22 0329 01/06/2022 1004  NA 137 135 134*  K 4.7 4.8 4.2  CL 101 99 98  CO2 '30 27 24  '$ GLUCOSE 219* 238* 354*  BUN 26* 25* 32*  CREATININE 0.61 0.61 0.94  CALCIUM 10.8* 10.5* 11.1*  AST  --   --  59*  ALT  --   --  215*  ALKPHOS  --   --  103  BILITOT  --   --  0.5   ------------------------------------------------------------------------------------------------------------------ estimated creatinine clearance is 45.4 mL/min (by C-G formula based on SCr of 0.94 mg/dL). ------------------------------------------------------------------------------------------------------------------ ------------------------------------------------------------------------------------------------------------------    Component Value Date/Time   BNP 74.0 01/04/2022 0952   Urinalysis    Component Value Date/Time   COLORURINE  YELLOW 01/04/2022 1034   APPEARANCEUR HAZY (A) 01/04/2022 1034   LABSPEC 1.021 01/04/2022 1034   PHURINE 5.0 01/04/2022 1034   GLUCOSEU NEGATIVE 01/04/2022 1034   HGBUR SMALL (A) 01/04/2022 1034   BILIRUBINUR NEGATIVE 01/04/2022 1034   KETONESUR 5 (A) 01/04/2022 1034   PROTEINUR 30 (A) 01/04/2022 1034   NITRITE NEGATIVE 01/04/2022 1034   LEUKOCYTESUR NEGATIVE 01/04/2022 1034    Imaging Results:    CT Angio Chest/Abd/Pel for Dissection W and/or W/WO  Result Date: 12/28/2021 CLINICAL DATA:  Diagnosis of COVID 10 days ago presenting with respiratory distress EXAM: CT ANGIOGRAPHY CHEST, ABDOMEN AND PELVIS TECHNIQUE: Non-contrast CT of the chest was initially obtained. Multidetector CT imaging through the chest, abdomen and pelvis was performed using the standard protocol during bolus administration of intravenous contrast. Multiplanar reconstructed images and MIPs were obtained and reviewed to evaluate the vascular anatomy. RADIATION DOSE REDUCTION: This exam was performed according to the departmental dose-optimization program which includes automated exposure control, adjustment of the mA and/or kV according to patient size and/or use of iterative reconstruction technique. CONTRAST:  167m OMNIPAQUE IOHEXOL 350 MG/ML SOLN COMPARISON:  CTA chest dated 01/04/2022 and multiple priors dating back to 03/16/2021 FINDINGS: CTA CHEST FINDINGS Cardiovascular: Preferential opacification of the thoracic aorta. No evidence of thoracic aortic aneurysm or dissection. Normal heart size. No pericardial effusion. Coronary artery calcifications. Aortic atherosclerosis. Mediastinum/Nodes: thyroid gland without nodules meeting criteria for imaging follow-up by size. Normal esophagus. No pathologically enlarged axillary, supraclavicular, mediastinal, or hilar lymph nodes. Lungs/Pleura: The central airways are patent. Extensive bilateral heterogeneous and consolidative airspace opacities are again seen, slightly improved  compared to 01/04/2022. No pneumothorax. No pleural effusion. Musculoskeletal: No acute or abnormal lytic or blastic osseous lesions. Multilevel degenerative changes of the thoracic spine. Old healed fracture of the left proximal humerus. Review of the MIP images confirms the above findings. CTA ABDOMEN AND PELVIS FINDINGS VASCULAR Aorta: Aortic atherosclerosis. Normal caliber aorta without aneurysm, dissection, vasculitis or significant stenosis. Celiac: Patent without evidence of aneurysm, dissection, vasculitis or significant stenosis. SMA: Patent without evidence of aneurysm, dissection, vasculitis or significant stenosis. Renals: Both renal arteries are patent. Calcified plaque at the right renal artery origin results in mild luminal narrowing. No evidence of aneurysm, dissection, vasculitis, fibromuscular dysplasia or significant stenosis. IMA: Patent without evidence of aneurysm, dissection, vasculitis or significant stenosis. Inflow: Patent without evidence of aneurysm, dissection, vasculitis or significant stenosis. Veins: No obvious venous abnormality within the limitations of this arterial phase study. Review of the MIP images confirms the above findings. NON-VASCULAR Hepatobiliary: Unchanged segment 2 subcentimeter hypodensity (5:90) dating back to 03/16/2021, likely cyst. Unchanged coarse calcification in the right hepatic lobe. No new focal hepatic lesions. No intra or extrahepatic biliary ductal dilation. Normal gallbladder. Pancreas: No focal lesions or main ductal dilation. Spleen: Normal in size without focal abnormality. Adrenals/Urinary Tract: No adrenal nodules. Bilateral hypoattenuating renal foci, many of which likely represent simple/mildly complicated cysts. A 3.3 cm left interpolar cystic lesion demonstrates peripheral nodular hyperattenuating foci (5:115). No hydronephrosis or calculi. No focal bladder wall thickening. Stomach/Bowel: Normal appearance of the stomach. No evidence of bowel  wall thickening, distention, or inflammatory changes. Sigmoid diverticulosis without acute  diverticulitis. Normal appendix. Lymphatic: No enlarged abdominal or pelvic lymph nodes. Reproductive: No adnexal masses. Other: No free fluid, fluid collection, or free air. Musculoskeletal: No acute or abnormal lytic or blastic osseous lesions. Multilevel degenerative changes of the lumbar spine. Subcutaneous soft tissue nodules in the anterior abdominal wall, likely related to medication injection. Review of the MIP images confirms the above findings. IMPRESSION: VASCULAR: 1. No evidence of aortic dissection or aneurysm. 2. No central pulmonary emboli. 3. Coronary artery calcifications. Aortic Atherosclerosis (ICD10-I70.0). NONVASCULAR: 1. Extensive bilateral heterogeneous and consolidative airspace opacities, slightly improved compared to 01/04/2022. 2. No acute abnormality in the abdomen or pelvis. 3. A 3.3 cm left interpolar cystic lesion demonstrates peripheral nodular hyperattenuating foci, incompletely characterized. Recommend further evaluation with renal mass protocol MRI or CT if the patient is unable to tolerate MRI. Of note for radiation reduction purposes, the patient has received 5 CTA examinations of the chest within the last month and 7 total CT chest examinations within the last year. 4. Sigmoid diverticulosis without acute diverticulitis. Electronically Signed   By: Darrin Nipper M.D.   On: 01/02/2022 12:12   DG Chest Port 1 View  Result Date: 01/15/2022 CLINICAL DATA:  Shortness of breath EXAM: PORTABLE CHEST 1 VIEW COMPARISON:  Two days ago FINDINGS: Unchanged bilateral airspace disease which was patchy and likely infectious by prior CT. No visible effusion or pneumothorax. Remote left proximal humerus fracture. IMPRESSION: Stable multifocal airspace disease. Electronically Signed   By: Jorje Guild M.D.   On: 01/12/2022 10:19    Radiological Exams on Admission: CT Angio Chest/Abd/Pel for  Dissection W and/or W/WO  Result Date: 01/23/2022 CLINICAL DATA:  Diagnosis of COVID 10 days ago presenting with respiratory distress EXAM: CT ANGIOGRAPHY CHEST, ABDOMEN AND PELVIS TECHNIQUE: Non-contrast CT of the chest was initially obtained. Multidetector CT imaging through the chest, abdomen and pelvis was performed using the standard protocol during bolus administration of intravenous contrast. Multiplanar reconstructed images and MIPs were obtained and reviewed to evaluate the vascular anatomy. RADIATION DOSE REDUCTION: This exam was performed according to the departmental dose-optimization program which includes automated exposure control, adjustment of the mA and/or kV according to patient size and/or use of iterative reconstruction technique. CONTRAST:  177m OMNIPAQUE IOHEXOL 350 MG/ML SOLN COMPARISON:  CTA chest dated 01/04/2022 and multiple priors dating back to 03/16/2021 FINDINGS: CTA CHEST FINDINGS Cardiovascular: Preferential opacification of the thoracic aorta. No evidence of thoracic aortic aneurysm or dissection. Normal heart size. No pericardial effusion. Coronary artery calcifications. Aortic atherosclerosis. Mediastinum/Nodes: thyroid gland without nodules meeting criteria for imaging follow-up by size. Normal esophagus. No pathologically enlarged axillary, supraclavicular, mediastinal, or hilar lymph nodes. Lungs/Pleura: The central airways are patent. Extensive bilateral heterogeneous and consolidative airspace opacities are again seen, slightly improved compared to 01/04/2022. No pneumothorax. No pleural effusion. Musculoskeletal: No acute or abnormal lytic or blastic osseous lesions. Multilevel degenerative changes of the thoracic spine. Old healed fracture of the left proximal humerus. Review of the MIP images confirms the above findings. CTA ABDOMEN AND PELVIS FINDINGS VASCULAR Aorta: Aortic atherosclerosis. Normal caliber aorta without aneurysm, dissection, vasculitis or significant  stenosis. Celiac: Patent without evidence of aneurysm, dissection, vasculitis or significant stenosis. SMA: Patent without evidence of aneurysm, dissection, vasculitis or significant stenosis. Renals: Both renal arteries are patent. Calcified plaque at the right renal artery origin results in mild luminal narrowing. No evidence of aneurysm, dissection, vasculitis, fibromuscular dysplasia or significant stenosis. IMA: Patent without evidence of aneurysm, dissection, vasculitis or significant stenosis. Inflow: Patent  without evidence of aneurysm, dissection, vasculitis or significant stenosis. Veins: No obvious venous abnormality within the limitations of this arterial phase study. Review of the MIP images confirms the above findings. NON-VASCULAR Hepatobiliary: Unchanged segment 2 subcentimeter hypodensity (5:90) dating back to 03/16/2021, likely cyst. Unchanged coarse calcification in the right hepatic lobe. No new focal hepatic lesions. No intra or extrahepatic biliary ductal dilation. Normal gallbladder. Pancreas: No focal lesions or main ductal dilation. Spleen: Normal in size without focal abnormality. Adrenals/Urinary Tract: No adrenal nodules. Bilateral hypoattenuating renal foci, many of which likely represent simple/mildly complicated cysts. A 3.3 cm left interpolar cystic lesion demonstrates peripheral nodular hyperattenuating foci (5:115). No hydronephrosis or calculi. No focal bladder wall thickening. Stomach/Bowel: Normal appearance of the stomach. No evidence of bowel wall thickening, distention, or inflammatory changes. Sigmoid diverticulosis without acute diverticulitis. Normal appendix. Lymphatic: No enlarged abdominal or pelvic lymph nodes. Reproductive: No adnexal masses. Other: No free fluid, fluid collection, or free air. Musculoskeletal: No acute or abnormal lytic or blastic osseous lesions. Multilevel degenerative changes of the lumbar spine. Subcutaneous soft tissue nodules in the anterior  abdominal wall, likely related to medication injection. Review of the MIP images confirms the above findings. IMPRESSION: VASCULAR: 1. No evidence of aortic dissection or aneurysm. 2. No central pulmonary emboli. 3. Coronary artery calcifications. Aortic Atherosclerosis (ICD10-I70.0). NONVASCULAR: 1. Extensive bilateral heterogeneous and consolidative airspace opacities, slightly improved compared to 01/04/2022. 2. No acute abnormality in the abdomen or pelvis. 3. A 3.3 cm left interpolar cystic lesion demonstrates peripheral nodular hyperattenuating foci, incompletely characterized. Recommend further evaluation with renal mass protocol MRI or CT if the patient is unable to tolerate MRI. Of note for radiation reduction purposes, the patient has received 5 CTA examinations of the chest within the last month and 7 total CT chest examinations within the last year. 4. Sigmoid diverticulosis without acute diverticulitis. Electronically Signed   By: Darrin Nipper M.D.   On: 01/01/2022 12:12   DG Chest Port 1 View  Result Date: 01/11/2022 CLINICAL DATA:  Shortness of breath EXAM: PORTABLE CHEST 1 VIEW COMPARISON:  Two days ago FINDINGS: Unchanged bilateral airspace disease which was patchy and likely infectious by prior CT. No visible effusion or pneumothorax. Remote left proximal humerus fracture. IMPRESSION: Stable multifocal airspace disease. Electronically Signed   By: Jorje Guild M.D.   On: 01/03/2022 10:19    DVT Prophylaxis -SCD  /eliquis AM Labs Ordered, also please review Full Orders  Family Communication: Admission, patients condition and plan of care including tests being ordered have been discussed with the patient and son who indicate understanding and agree with the plan   Condition  -fair  Roxan Hockey M.D on 01/20/2022 at 7:10 PM Go to [email or web address] -  for contact info  [facility] - Office  [phone]

## 2022-01-14 ENCOUNTER — Encounter (HOSPITAL_COMMUNITY): Payer: Self-pay | Admitting: Family Medicine

## 2022-01-14 DIAGNOSIS — Z515 Encounter for palliative care: Secondary | ICD-10-CM

## 2022-01-14 DIAGNOSIS — J9622 Acute and chronic respiratory failure with hypercapnia: Secondary | ICD-10-CM | POA: Diagnosis not present

## 2022-01-14 DIAGNOSIS — J9621 Acute and chronic respiratory failure with hypoxia: Secondary | ICD-10-CM | POA: Diagnosis not present

## 2022-01-14 DIAGNOSIS — E119 Type 2 diabetes mellitus without complications: Secondary | ICD-10-CM

## 2022-01-14 DIAGNOSIS — I10 Essential (primary) hypertension: Secondary | ICD-10-CM | POA: Diagnosis not present

## 2022-01-14 DIAGNOSIS — Z7189 Other specified counseling: Secondary | ICD-10-CM | POA: Diagnosis not present

## 2022-01-14 LAB — GLUCOSE, CAPILLARY
Glucose-Capillary: 275 mg/dL — ABNORMAL HIGH (ref 70–99)
Glucose-Capillary: 265 mg/dL — ABNORMAL HIGH (ref 70–99)
Glucose-Capillary: 143 mg/dL — ABNORMAL HIGH (ref 70–99)
Glucose-Capillary: 225 mg/dL — ABNORMAL HIGH (ref 70–99)

## 2022-01-14 LAB — SEDIMENTATION RATE: Sed Rate: 18 mm/hr (ref 0–22)

## 2022-01-14 NOTE — Consult Note (Signed)
NAME:  Michaela Moon, MRN:  [id number], DOB:  04-30-1935, LOS: 1 ADMISSION DATE:  01/07/2022, CONSULTATION DATE:  01/14/22 REFERRING MD: Margot Chimes, CHIEF COMPLAINT:  recurrent acute resp failure    History of Present Illness:  62 yowf  DNR  quit smoking 19 y PTA unable to do baseline pfts with profound FTT since covid pna 10/27/21 and 02 dep with concern re recurrent LPR/aspiration now on 5th  admit since covid with same presentation p d/c  01/12/22 and not seen by Dr Halford Chessman since  11/30/21 as outpt and Elsworth Soho 12/28/21 as inpt:  his imprression was: although better with prednisone, flares even while still on it so more likely recurrent aspiration" and pt DNR status/ d/c on  11/18 on pred taper and D2 diet / HOB at least 30 degrees  up  and 5lpm Nasal 02 [name] and lopressor 50 mg bid and readmitted same problems on 11/19 and PCCM service asked to re-eval am 11/20  Baseline = bed to [facility], has declined snf/rehab past admits  Says starts to feel more a more congested on d/c with no direct link to meals or overt aspiration, cp, fever N or V   Pertinent  Medical History  hypertension,  T2DM,  dysphagia requiring dilation 04/2016 for ? web COPD and chronic respiratory failure on 2 L of oxygen   Significant Hospital Events: Including procedures, antibiotic start and stop dates in addition to other pertinent events   Echo 11/94/17  Grade 1 diastolic dysfunction nl LA CTa 11/19 Extensive bilateral heterogeneous and consolidative airspace opacities, slightly improved compared to 01/04/2022.   Scheduled Meds:  amoxicillin-clavulanate  1 tablet Oral Q12H   apixaban  5 mg Oral BID   atorvastatin  80 mg Oral Daily   Chlorhexidine Gluconate Cloth  6 each Topical Daily   dextromethorphan-guaiFENesin  1 tablet Oral BID   ferrous sulfate  325 mg Oral Q breakfast   fluconazole  200 mg Oral Daily   gabapentin  100 mg Oral TID   insulin aspart  0-20 Units Subcutaneous TID WC   insulin aspart  0-5 Units  Subcutaneous QHS   insulin glargine-yfgn  28 Units Subcutaneous Daily   ipratropium-albuterol  3 mL Nebulization Q6H   methylPREDNISolone (SOLU-MEDROL) injection  40 mg Intravenous Q12H   metoprolol tartrate  50 mg Oral BID   mometasone-formoterol  2 puff Inhalation BID   mouth rinse  15 mL Mouth Rinse 4 times per day   pantoprazole  40 mg Oral Daily   polyethylene glycol  17 g Oral Daily   polyvinyl alcohol  2 drop Both Eyes QID   senna-docusate  2 tablet Oral QHS   sodium chloride flush  3 mL Intravenous Q12H   sodium chloride flush  3 mL Intravenous Q12H   umeclidinium bromide  1 puff Inhalation Daily   Continuous Infusions:  sodium chloride     sodium chloride 100 mL/hr at 01/14/22 1200   PRN Meds:.sodium chloride, acetaminophen **OR** acetaminophen, albuterol, albuterol, bisacodyl, ondansetron **OR** ondansetron (ZOFRAN) IV, ondansetron, polyethylene glycol, sodium chloride flush, traZODone    Interim History / Subjective:  Sitting in chair eating chocolate pudding, willing to consider SNF op this admit   Objective   Blood pressure (!) 164/65, pulse 80, temperature (!) 97.2 F (36.2 C), temperature source Oral, resp. rate 20, height _0  (1.651 m), weight 75.7 kg, SpO2 90 %.    FiO2 (%):  [60 %-70 %] 60 %   Intake/Output Summary (Last 24 hours) at  01/14/2022 1309 Last data filed at 01/14/2022 1246 Gross per 24 hour  Intake 2436.2 ml  Output 1801 ml  Net 635.2 ml   Filed Weights   01/12/2022 0949 01/06/2022 1339  Weight: 74.5 kg 75.7 kg    Examination: General: elderly wf sitting in chair eating pudding Tmax:  98.3 At Rest 02 sats  95% on 8lpm HFNC  No jvd Oropharynx clear,  mucosa nl Neck supple Lungs with coarse junky insp/exp rhonchi bilaterally RRR no s3 or or sign murmur Abd obese with limitd  excursion  Extr warm with no edema or clubbing noted Neuro  Sensorium intact,  no apparent motor deficits       Assessment & Plan:  1) acute on chronic resp  failure s/p Covid pna 10/27/21 admit in pt with likely relatively mild copd  and c/b likely intermittent aspiration >>> D 2 diet/ 02/ abx approp ? Need for prednisone at this point? > check ESR  Rec  D/ c incruse since o duoneb( could change to anoro at d/c to NH) but avoid trelegy here as may compromise lung defenses.   proceed with snf / wean 02 as tol / augmentin empirically ok  since 11/12    Nothing more to offer for now.         Labs   CBC: Recent Labs  Lab 01/09/22 0349 01/11/22 0329 01/12/2022 1004  WBC 9.7 11.3* 27.4*  NEUTROABS  --   --  20.8*  HGB 9.6* 10.1* 11.9*  HCT 31.0* 32.0* 38.2  MCV 88.3 87.4 88.8  PLT 165 199 938    Basic Metabolic Panel: Recent Labs  Lab 01/09/22 0349 01/11/22 0329 12/30/2021 1004  NA 137 135 134*  K 4.7 4.8 4.2  CL 101 99 98  CO2 _0 GLUCOSE 219* 238* 354*  BUN 26* 25* 32*  CREATININE 0.61 0.61 0.94  CALCIUM 10.8* 10.5* 11.1*   GFR: Estimated Creatinine Clearance: 45.4 mL/min (by C-G formula based on SCr of 0.94 mg/dL). Recent Labs  Lab 01/09/22 0349 01/11/22 0329 12/31/2021 1004  WBC 9.7 11.3* 27.4*    Liver Function Tests: Recent Labs  Lab 01/23/2022 1004  AST 59*  ALT 215*  ALKPHOS 103  BILITOT 0.5  PROT 6.9  ALBUMIN 3.7   No results for input(s): "LIPASE", "AMYLASE" in the last 168 hours. No results for input(s): "AMMONIA" in the last 168 hours.  ABG    Component Value Date/Time   PHART 7.38 12/25/2021 0943   PCO2ART 36 12/25/2021 0943   PO2ART 66 (L) 12/25/2021 0943   HCO3 25.4 01/14/2022 1201   ACIDBASEDEF 0.4 01/07/2022 1201   O2SAT 53.5 01/14/2022 1201     Coagulation Profile: No results for input(s): "INR", "PROTIME" in the last 168 hours.  Cardiac Enzymes: No results for input(s): "CKTOTAL", "CKMB", "CKMBINDEX", "TROPONINI" in the last 168 hours.  HbA1C: Hgb A1c MFr Bld  Date/Time Value Ref Range Status  12/08/2021 04:46 AM 6.3 (H) 4.8 - 5.6 % Final    Comment:    (NOTE) Pre  diabetes:          5.7%-6.4%  Diabetes:              >6.4%  Glycemic control for   <7.0% adults with diabetes   10/21/2017 11:18 AM 7.5 (H) 4.8 - 5.6 % Final    Comment:    (NOTE) Pre diabetes:          5.7%-6.4% Diabetes:              >  6.4% Glycemic control for   <7.0% adults with diabetes     CBG: Recent Labs  Lab 01/12/22 1702 01/17/2022 1618 01/24/2022 2030 01/14/22 0818 01/14/22 1202  GLUCAP 223* 315* 294* 275* 265*       Past Medical History:  She,  has a past medical history of Arthritis, Asthma, COPD (chronic obstructive pulmonary disease) ([place]), Diabetes mellitus without complication ([place]), GERD (gastroesophageal reflux disease), Headache, Hyperlipidemia, Hypertension, Macular degeneration, Neuropathy, Obesity, Osteopenia, and Stress incontinence.   Surgical History:   Past Surgical History:  Procedure Laterality Date   BREAST LUMPECTOMY Bilateral    no cancer   CARPAL TUNNEL RELEASE Left 10/23/2017   Procedure: LEFT CARPAL TUNNEL RELEASE;  Surgeon: Carole Civil, MD;  Location: [facility];  Service: Orthopedics;  Laterality: Left;   CARPAL TUNNEL RELEASE Right 11/20/2017   Procedure: CARPAL TUNNEL RELEASE;  Surgeon: Carole Civil, MD;  Location: [facility];  Service: Orthopedics;  Laterality: Right;   CATARACT EXTRACTION W/PHACO Left 11/01/2016   Procedure: CATARACT EXTRACTION PHACO AND INTRAOCULAR LENS PLACEMENT (IOC);  Surgeon: Baruch Goldmann, MD;  Location: [facility];  Service: Ophthalmology;  Laterality: Left;  CDE: 3.83   CATARACT EXTRACTION W/PHACO Right 11/29/2016   Procedure: CATARACT EXTRACTION PHACO AND INTRAOCULAR LENS PLACEMENT RIGHT EYE;  Surgeon: Baruch Goldmann, MD;  Location: [facility];  Service: Ophthalmology;  Laterality: Right;  CDE: 8.71   colon tumor removed  2010   Dr. 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 submucosal lipoma on path   COLONOSCOPY  05/2013   Dr. Anthony Sar: normal. (h/o colon polyps)    COLONOSCOPY  2011   normal   COLONOSCOPY  10/2003   Dr. Lindalou Hose: large polypoid mass hepatic fluexure   ESOPHAGOGASTRODUODENOSCOPY N/A 04/29/2016   Procedure: ESOPHAGOGASTRODUODENOSCOPY (EGD);  Surgeon: Danie Binder, MD;  Location: [facility];  Service: Endoscopy;  Laterality: N/A;  9:15am   SAVORY DILATION N/A 04/29/2016   Procedure: SAVORY DILATION;  Surgeon: Danie Binder, MD;  Location: [facility];  Service: Endoscopy;  Laterality: N/A;     Social History:   reports that she quit smoking about 19 years ago. Her smoking use included cigarettes. She has a 40.00 pack-year smoking history. She has never used smokeless tobacco. She reports that she does not drink alcohol and does not use drugs.   Family History:  Her family history includes Melanoma in her brother. There is no history of Colon cancer.   Allergies Allergies  Allergen Reactions   Onion     wheezing     Home Medications  Prior to Admission medications   Medication Sig Start Date End Date Taking? Authorizing Provider  apixaban (ELIQUIS) 5 MG TABS tablet Take 1 tablet (5 mg total) by mouth 2 (two) times daily. 01/12/22  Yes Roxan Hockey, MD  acetaminophen (TYLENOL) 325 MG tablet Take 2 tablets (650 mg total) by mouth every 6 (six) hours as needed for mild pain (or Fever >/= 101). 01/12/22   Roxan Hockey, MD  albuterol (PROVENTIL) (2.5 MG/3ML) 0.083% nebulizer solution Take 3 mLs (2.5 mg total) by nebulization every 6 (six) hours as needed for wheezing or shortness of breath. 01/12/22   Roxan Hockey, MD  albuterol-ipratropium (COMBIVENT) 18-103 MCG/ACT inhaler Inhale 2 puffs into the lungs every 6 (six) hours as needed for wheezing. 01/12/22   Roxan Hockey, MD  amoxicillin-clavulanate (AUGMENTIN) 875-125 MG tablet Take 1 tablet by mouth 2 (two) times daily for 5 days. 01/12/22 01/17/22  Emokpae,  Courage, MD  atorvastatin (LIPITOR) 80 MG tablet ALTERNATE TAKING 1 TABLET EVERY OTHER DAY WITH 1/2 TABLET  EVERY OTHERDAY 01/13/14   Chipper Herb, MD  Biotin w/ Vitamins C & E (HAIR/SKIN/NAILS PO) Take 1 tablet by mouth daily. Takes once a day.    [provider]  Budeson-Glycopyrrol-Formoterol (BREZTRI AEROSPHERE) 160-9-4.8 MCG/ACT AERO Inhale 2 puffs into the lungs in the morning and at bedtime. 03/08/21   Chesley Mires, MD  dextromethorphan-guaiFENesin (MUCINEX DM) 30-600 MG 12hr tablet Take 1 tablet by mouth 2 (two) times daily for 10 days. 01/12/22 01/22/22  Roxan Hockey, MD  docusate sodium (COLACE) 100 MG capsule Take 1 capsule (100 mg total) by mouth 2 (two) times daily. 01/12/22 01/12/23  Roxan Hockey, MD  ferrous sulfate 325 (65 FE) MG tablet Take 1 tablet (325 mg total) by mouth daily. 12/04/21 12/04/22  Manuella Ghazi, Pratik D, DO  fluconazole (DIFLUCAN) 200 MG tablet Take 1 tablet (200 mg total) by mouth daily. 01/12/22   Roxan Hockey, MD  fluticasone (FLONASE) 50 MCG/ACT nasal spray Place 1 spray into both nostrils daily. 10/19/20   Chesley Mires, MD  gabapentin (NEURONTIN) 100 MG capsule Take 1 capsule (100 mg total) by mouth 3 (three) times daily. 12/20/21   Johnson, Clanford L, MD  glimepiride (AMARYL) 2 MG tablet Take 4 mg by mouth daily with breakfast.     [provider]  guaiFENesin (MUCINEX) 600 MG 12 hr tablet Take 2 tablets (1,200 mg total) by mouth 2 (two) times daily as needed for cough or to loosen phlegm. 01/12/22   Roxan Hockey, MD  Hypromellose (ARTIFICIAL TEARS OP) Place 1 drop into both eyes 4 (four) times daily.    [provider]  LANTUS SOLOSTAR 100 UNIT/ML Solostar Pen Inject 20 Units into the skin in the morning and at bedtime. 11/22/20   [provider]  losartan-hydrochlorothiazide (HYZAAR) 100-12.5 MG per tablet TAKE ONE (1) TABLET EACH DAY Patient taking differently: Take 1 tablet by mouth daily. 03/09/14   Chipper Herb, MD  metoprolol (LOPRESSOR) 50 MG tablet Take 50 mg by mouth 2 (two) times daily.    [provider]  montelukast (SINGULAIR) 10 MG tablet TAKE ONE TABLET BY MOUTH AT BEDTIME 07/30/21   Chesley Mires, MD  Multiple Vitamins-Minerals (CENTRUM SILVER PO) Take 1 tablet by mouth daily.    [provider]  Omega-3 Fatty Acids (FISH OIL) 1000 MG CAPS Take 1,000 mg by mouth in the morning, at noon, and at bedtime.    [provider]  ondansetron (ZOFRAN) 4 MG tablet Take 1 tablet (4 mg total) by mouth every 6 (six) hours as needed for nausea. 01/12/22   Roxan Hockey, MD  oxybutynin (DITROPAN) 5 MG tablet Take 5 mg by mouth every 8 (eight) hours as needed for bladder spasms.     [provider]  pantoprazole (PROTONIX) 40 MG tablet Take 1 tablet (40 mg total) by mouth daily. 01/12/22   Roxan Hockey, MD  polyethylene glycol (MIRALAX / GLYCOLAX) 17 g packet Take 17 g by mouth daily as needed for mild constipation. 12/20/21   Johnson, Clanford L, MD  predniSONE (DELTASONE) 10 MG tablet Take 1 tablet (10 mg total) by mouth daily with breakfast. Take 40 mg (4 tab) daily for 3 days , then 30 mg (3 Tab) daily for 3 days, then Take 20 mg (2 Tab) daily for 3 days, then Take 10 mg (10 mg) daily for 3 days... Then STOP 01/12/22  Roxan Hockey, MD  sitaGLIPtin-metformin (JANUMET) 50-1000 MG tablet Take 1 tablet by mouth 2 (two) times daily with a meal.    [provider]  triamcinolone cream (KENALOG) 0.1 % Apply 1 application topically daily as needed (eczema).    [provider]     Christinia Gully, MD [facility] [place] [phone]   After 7:00 pm call Elink  [phone]

## 2022-01-14 NOTE — Progress Notes (Signed)
PROGRESS NOTE     Michaela Moon, is a 86 y.o. female, DOB - Aug 13, 1935, CXK:[id number]  Admit date - 01/17/2022   Admitting Physician Tressie Ragin Denton Brick, MD  Outpatient Primary MD for the patient is [name], MD  LOS - 1  Chief Complaint  Patient presents with   Respiratory Distress        Brief Narrative:  86 y.o. female who is a reformed smoker (quit in 2004 )with medical history significant of COPD, diabetes, hypertension, lower extremity DVT (on chronic Eliquis), diagnosis of COVID in on 10/31/21 and again on 01/04/22 (after testing negative on 11/28/2021 and 12/07/2021) and recurrent hospitalization for aspiration pneumonia and acute on chronic respiratory failure---chronically on 5 L nasal cannula supplementation  -Discharged home on 01/12/2022 -Readmitted on 01/20/2022 with acute on chronic hypoxic respiratory failure As per pt's son she did okay in the evening of 01/12/2022-----this morning she became acutely short of breath, she was found to be very hypoxic, EMS was called and she was placed on BiPAP - Patient appears to have significant recurrent respiratory problems since she suffered COVID back in September 2023      -Assessment and Plan: 1)Acute on chronic respiratory failure with hypoxia -suspect recurrent aspiration episodes and COPD exacerbation -CT angio chest shows  Extensive bilateral heterogeneous and consolidative airspace Opacities -Weaned off continuous BiPAP on 01/14/2022, , may continue as needed BiPAP and BiPAP nightly -Recent MRSA PCR negative --Continue bronchodilators and mucolytics -c/n  IV Solu-Medrol, continue augmentin and fluconazole. -Patient appears to have significant recurrent respiratory problems since she suffered COVID back in September 2023   2)Dysphagia--??? Reflux related -With a high risk for aspiration -Continue dysphagia 2 diet with nectar thick liquids. -Education provided for upright position during meals and after meals to  continue minimizing chance of aspiration. -[name] reports having hospital bed for her at home.   3)H/o Bil DVT-lower extremity venous Dopplers from 12/08/2021 noted -CTA angio chest from 01/05/2022, 12/08/2021 and repeat CTA chest 12/25/2021 without acute PE -Continue Eliquis twice daily   4)Chronic Diastolic HF (Heart Failure) (HCC) -No vascular congestion appreciated on x-ray or CT scan -Continue home medication regimen. -Maintain adequate hydration. -Patient chronically on HCTZ. -Continue to follow urine output.   5)Insulin dependent type 2 diabetes mellitus (HCC) --Recent A1c 6.3-reflecting excellent diabetic control PTA -Anticipate steroid-induced hyperglycemia -continue  levemir Use Novolog/Humalog Sliding scale insulin with Accu-Cheks/Fingersticks as ordered    6) social/ethics--- advanced directives discussed with patient and son, patient request DNR/DNI status   7)Pressure injury of skin--POA -No signs of superimposed infection -Provide preventive measures and constant repositioning.   8)GERD (gastroesophageal reflux disease) -Continue PPI -Dose adjusted to twice a day while receiving steroids for further GI prophylaxis.   9)Mixed hyperlipidemia -Continue statin   10)-HTN-c/n  metoprolol 50 mg twice daily    Disposition: Anticipate that discharge home with home Vs SNF rehab    Status is: Inpatient    Disposition: The patient is from: Home              Anticipated d/c is to: Home with HH Vs SNF              Anticipated d/c date is: 3 days              Patient currently is not medically stable to d/c. Barriers: Not Clinically Stable-   Code Status :  -  Code Status: DNR   Family Communication:   Discussed with Son  DVT Prophylaxis  :   -  SCDs  SCDs Start: 01/14/2022 1311 Place TED hose Start: 12/31/2021 1311 apixaban (ELIQUIS) tablet 5 mg   Lab Results  Component Value Date   PLT 269 01/01/2022   Inpatient Medications  Scheduled Meds:   amoxicillin-clavulanate  1 tablet Oral Q12H   apixaban  5 mg Oral BID   atorvastatin  80 mg Oral Daily   Chlorhexidine Gluconate Cloth  6 each Topical Daily   dextromethorphan-guaiFENesin  1 tablet Oral BID   ferrous sulfate  325 mg Oral Q breakfast   fluconazole  200 mg Oral Daily   gabapentin  100 mg Oral TID   insulin aspart  0-20 Units Subcutaneous TID WC   insulin aspart  0-5 Units Subcutaneous QHS   insulin glargine-yfgn  28 Units Subcutaneous Daily   ipratropium-albuterol  3 mL Nebulization Q6H   methylPREDNISolone (SOLU-MEDROL) injection  40 mg Intravenous Q12H   metoprolol tartrate  50 mg Oral BID   mometasone-formoterol  2 puff Inhalation BID   mouth rinse  15 mL Mouth Rinse 4 times per day   pantoprazole  40 mg Oral Daily   polyethylene glycol  17 g Oral Daily   polyvinyl alcohol  2 drop Both Eyes QID   senna-docusate  2 tablet Oral QHS   sodium chloride flush  3 mL Intravenous Q12H   sodium chloride flush  3 mL Intravenous Q12H   Continuous Infusions:  sodium chloride     sodium chloride 100 mL/hr at 01/14/22 1200   PRN Meds:.sodium chloride, acetaminophen **OR** acetaminophen, albuterol, albuterol, bisacodyl, ondansetron **OR** ondansetron (ZOFRAN) IV, ondansetron, polyethylene glycol, sodium chloride flush, traZODone   Anti-infectives (From admission, onward)    Start     Dose/Rate Route Frequency Ordered Stop   01/23/2022 1315  amoxicillin-clavulanate (AUGMENTIN) 875-125 MG per tablet 1 tablet        1 tablet Oral Every 12 hours 01/24/2022 1302 01/20/22 0959   12/26/2021 1315  fluconazole (DIFLUCAN) tablet 200 mg        200 mg Oral Daily 01/17/2022 1312 01/18/22 0959       Subjective: [name] today has no fevers, no emesis,  No chest pain,   Son at bedside -Cough and dyspnea is not worse -She is able to come off BiPAP -  Objective: Vitals:   01/14/22 1300 01/14/22 1400 01/14/22 1439 01/14/22 1546  BP: (!) 146/50  (!) 168/130   Pulse: 75 71 71   Resp:  (!) 23 (!) 21 (!) 28   Temp:    98 F (36.7 C)  TempSrc:    Oral  SpO2: (!) 89% 96% 97%   Weight:      Height:        Intake/Output Summary (Last 24 hours) at 01/14/2022 1614 Last data filed at 01/14/2022 1246 Gross per 24 hour  Intake 1954.85 ml  Output 1701 ml  Net 253.85 ml   Filed Weights   01/01/2022 0949 12/26/2021 1339  Weight: 74.5 kg 75.7 kg   Physical Exam Gen:- Awake Alert,  some conversational dyspnea HEENT:- [place].AT, No sclera icterus, Nose-- [place] 9 L/min (able to come off BiPAP) Neck-Supple Neck,No JVD,.  Lungs--diminished breath sounds with scattered rhonchi and wheezes bilaterally CV- S1, S2 normal, regular , tachycardic Abd-  +ve B.Sounds, Abd Soft, No tenderness,    Extremity/Skin:- No significant edema, pedal pulses present  Psych-affect is appropriate, oriented x3 Neuro-generalized weakness, no new focal deficits, no tremors   Data Reviewed: I have personally reviewed following labs and imaging studies  CBC: Recent Labs  Lab 01/09/22 0349 01/11/22 0329 01/14/2022 1004  WBC 9.7 11.3* 27.4*  NEUTROABS  --   --  20.8*  HGB 9.6* 10.1* 11.9*  HCT 31.0* 32.0* 38.2  MCV 88.3 87.4 88.8  PLT 165 199 440   Basic Metabolic Panel: Recent Labs  Lab 01/09/22 0349 01/11/22 0329 01/08/2022 1004  NA 137 135 134*  K 4.7 4.8 4.2  CL 101 99 98  CO2 '30 27 24  '$ GLUCOSE 219* 238* 354*  BUN 26* 25* 32*  CREATININE 0.61 0.61 0.94  CALCIUM 10.8* 10.5* 11.1*   GFR: Estimated Creatinine Clearance: 45.4 mL/min (by C-G formula based on SCr of 0.94 mg/dL). Liver Function Tests: Recent Labs  Lab 01/20/2022 1004  AST 59*  ALT 215*  ALKPHOS 103  BILITOT 0.5  PROT 6.9  ALBUMIN 3.7   Radiology Studies: CT Angio Chest/Abd/Pel for Dissection W and/or W/WO  Result Date: 01/08/2022 CLINICAL DATA:  Diagnosis of COVID 10 days ago presenting with respiratory distress EXAM: CT ANGIOGRAPHY CHEST, ABDOMEN AND PELVIS TECHNIQUE: Non-contrast CT of the chest was initially obtained.  Multidetector CT imaging through the chest, abdomen and pelvis was performed using the standard protocol during bolus administration of intravenous contrast. Multiplanar reconstructed images and MIPs were obtained and reviewed to evaluate the vascular anatomy. RADIATION DOSE REDUCTION: This exam was performed according to the departmental dose-optimization program which includes automated exposure control, adjustment of the mA and/or kV according to patient size and/or use of iterative reconstruction technique. CONTRAST:  133m OMNIPAQUE IOHEXOL 350 MG/ML SOLN COMPARISON:  CTA chest dated 01/04/2022 and multiple priors dating back to 03/16/2021 FINDINGS: CTA CHEST FINDINGS Cardiovascular: Preferential opacification of the thoracic aorta. No evidence of thoracic aortic aneurysm or dissection. Normal heart size. No pericardial effusion. Coronary artery calcifications. Aortic atherosclerosis. Mediastinum/Nodes: thyroid gland without nodules meeting criteria for imaging follow-up by size. Normal esophagus. No pathologically enlarged axillary, supraclavicular, mediastinal, or hilar lymph nodes. Lungs/Pleura: The central airways are patent. Extensive bilateral heterogeneous and consolidative airspace opacities are again seen, slightly improved compared to 01/04/2022. No pneumothorax. No pleural effusion. Musculoskeletal: No acute or abnormal lytic or blastic osseous lesions. Multilevel degenerative changes of the thoracic spine. Old healed fracture of the left proximal humerus. Review of the MIP images confirms the above findings. CTA ABDOMEN AND PELVIS FINDINGS VASCULAR Aorta: Aortic atherosclerosis. Normal caliber aorta without aneurysm, dissection, vasculitis or significant stenosis. Celiac: Patent without evidence of aneurysm, dissection, vasculitis or significant stenosis. SMA: Patent without evidence of aneurysm, dissection, vasculitis or significant stenosis. Renals: Both renal arteries are patent. Calcified plaque  at the right renal artery origin results in mild luminal narrowing. No evidence of aneurysm, dissection, vasculitis, fibromuscular dysplasia or significant stenosis. IMA: Patent without evidence of aneurysm, dissection, vasculitis or significant stenosis. Inflow: Patent without evidence of aneurysm, dissection, vasculitis or significant stenosis. Veins: No obvious venous abnormality within the limitations of this arterial phase study. Review of the MIP images confirms the above findings. NON-VASCULAR Hepatobiliary: Unchanged segment 2 subcentimeter hypodensity (5:90) dating back to 03/16/2021, likely cyst. Unchanged coarse calcification in the right hepatic lobe. No new focal hepatic lesions. No intra or extrahepatic biliary ductal dilation. Normal gallbladder. Pancreas: No focal lesions or main ductal dilation. Spleen: Normal in size without focal abnormality. Adrenals/Urinary Tract: No adrenal nodules. Bilateral hypoattenuating renal foci, many of which likely represent simple/mildly complicated cysts. A 3.3 cm left interpolar cystic lesion demonstrates peripheral nodular hyperattenuating foci (5:115). No hydronephrosis or calculi. No focal bladder wall thickening.  Stomach/Bowel: Normal appearance of the stomach. No evidence of bowel wall thickening, distention, or inflammatory changes. Sigmoid diverticulosis without acute diverticulitis. Normal appendix. Lymphatic: No enlarged abdominal or pelvic lymph nodes. Reproductive: No adnexal masses. Other: No free fluid, fluid collection, or free air. Musculoskeletal: No acute or abnormal lytic or blastic osseous lesions. Multilevel degenerative changes of the lumbar spine. Subcutaneous soft tissue nodules in the anterior abdominal wall, likely related to medication injection. Review of the MIP images confirms the above findings. IMPRESSION: VASCULAR: 1. No evidence of aortic dissection or aneurysm. 2. No central pulmonary emboli. 3. Coronary artery calcifications.  Aortic Atherosclerosis (ICD10-I70.0). NONVASCULAR: 1. Extensive bilateral heterogeneous and consolidative airspace opacities, slightly improved compared to 01/04/2022. 2. No acute abnormality in the abdomen or pelvis. 3. A 3.3 cm left interpolar cystic lesion demonstrates peripheral nodular hyperattenuating foci, incompletely characterized. Recommend further evaluation with renal mass protocol MRI or CT if the patient is unable to tolerate MRI. Of note for radiation reduction purposes, the patient has received 5 CTA examinations of the chest within the last month and 7 total CT chest examinations within the last year. 4. Sigmoid diverticulosis without acute diverticulitis. Electronically Signed   By: Darrin Nipper M.D.   On: 12/26/2021 12:12   DG Chest Port 1 View  Result Date: 01/04/2022 CLINICAL DATA:  Shortness of breath EXAM: PORTABLE CHEST 1 VIEW COMPARISON:  Two days ago FINDINGS: Unchanged bilateral airspace disease which was patchy and likely infectious by prior CT. No visible effusion or pneumothorax. Remote left proximal humerus fracture. IMPRESSION: Stable multifocal airspace disease. Electronically Signed   By: Jorje Guild M.D.   On: 01/09/2022 10:19     Scheduled Meds:  amoxicillin-clavulanate  1 tablet Oral Q12H   apixaban  5 mg Oral BID   atorvastatin  80 mg Oral Daily   Chlorhexidine Gluconate Cloth  6 each Topical Daily   dextromethorphan-guaiFENesin  1 tablet Oral BID   ferrous sulfate  325 mg Oral Q breakfast   fluconazole  200 mg Oral Daily   gabapentin  100 mg Oral TID   insulin aspart  0-20 Units Subcutaneous TID WC   insulin aspart  0-5 Units Subcutaneous QHS   insulin glargine-yfgn  28 Units Subcutaneous Daily   ipratropium-albuterol  3 mL Nebulization Q6H   methylPREDNISolone (SOLU-MEDROL) injection  40 mg Intravenous Q12H   metoprolol tartrate  50 mg Oral BID   mometasone-formoterol  2 puff Inhalation BID   mouth rinse  15 mL Mouth Rinse 4 times per day    pantoprazole  40 mg Oral Daily   polyethylene glycol  17 g Oral Daily   polyvinyl alcohol  2 drop Both Eyes QID   senna-docusate  2 tablet Oral QHS   sodium chloride flush  3 mL Intravenous Q12H   sodium chloride flush  3 mL Intravenous Q12H   Continuous Infusions:  sodium chloride     sodium chloride 100 mL/hr at 01/14/22 1200     LOS: 1 day    Roxan Hockey M.D on 01/14/2022 at 4:14 PM  Go to [email or web address] - for contact info  [facility] - Office  [phone]  If 7PM-7AM, please contact night-coverage [email or web address] 01/14/2022, 4:14 PM

## 2022-01-14 NOTE — Inpatient Diabetes Management (Signed)
Inpatient Diabetes Program Recommendations  AACE/ADA: New Consensus Statement on Inpatient Glycemic Control (2015)  Target Ranges:  Prepandial:   less than 140 mg/dL      Peak postprandial:   less than 180 mg/dL (1-2 hours)      Critically ill patients:  140 - 180 mg/dL   Lab Results  Component Value Date   GLUCAP 275 (H) 01/14/2022   HGBA1C 6.3 (H) 12/08/2021    Review of Glycemic Control  Latest Reference Range & Units 01/12/22 07:39 01/12/22 12:02 01/12/22 17:02 01/10/2022 16:18 01/06/2022 20:30 01/14/22 08:18  Glucose-Capillary 70 - 99 mg/dL 278 (H) 389 (H) 223 (H) 315 (H) 294 (H) 275 (H)  (H): Data is abnormally high  Diabetes history: DM2 Outpatient Diabetes medications:  Lantus 20 units QAM Amaryl 4 mg QD Janumet 50-1000 mg BID Current orders for Inpatient glycemic control:  Semglee 28 units QD (increased on 11/19 from 20 units) Novolog 0-20 units TID and 0-5 units QHS Solumedrol 40 mg Q12H  Inpatient Diabetes Program Recommendations:    Noted Semglee increased on 01/03/2022 to 28 units QD.  Meal coverage was discontinued. While receiving steroids, please continue meal coverage.    Novolog 6 units TID with meals if consumes at least 50%.  Will continue to follow while inpatient.  Thank you, Reche Dixon, MSN, [place] [facility] Coordinator Inpatient Diabetes Program [phone] (team pager from 8a-5p)

## 2022-01-14 NOTE — Consult Note (Signed)
Consultation Note Date: 01/14/2022   Patient Name: Michaela Moon  DOB: August 31, 1935  MRN: [id number]  Age / Sex: 86 y.o., female  PCP: [name], MD Referring Physician: Roxan Hockey, MD  Reason for Consultation: Establishing goals of care  HPI/Patient Profile: 86 y.o. female  with past medical history of former smoker/quit in '04 with COPD, DM, HTN, LE DVT on Eliquis, COVID 10/31/2021 and again 01/04/2022, recurrent hospitalization for aspiration pneumonia and acute on chronic respiratory failure, chronic 5 L [place], 5 hospital stays in the last 6 months, discharged 11/18 returned 11/19 admitted on 01/09/2022 with acute on chronic resp failure with recurrent aspiration.   Clinical Assessment and Goals of Care: I have reviewed medical records including EPIC notes, labs and imaging, received report from RN, assessed the patient. Michaela Moon is resting quietly in bed.  She appears acutely/chronically ill and frail.  She greets me, making and somewhat keeping eye contact.  She is alert and oriented, able to make her needs known.  There is no family at bedside at this time.   We met at the bedside to discuss diagnosis prognosis, GOC, EOL wishes, disposition and options. I introduced Palliative Medicine as specialized medical care for people living with serious illness. It focuses on providing relief from the symptoms and stress of a serious illness. The goal is to improve quality of life for both the patient and the family.  We discussed a brief life review of the patient.  Michaela Moon son lives in her home and helps with ADL's at times.   We then focused on their current illness. We talk about her acute illness, her respiratory failure.  Michaela Moon cares, "I believe I can get better".  I shared that things are starting to look different for her now, multiple hospitalizations with aspiration pneumonia.  I  encourage Michaela Moon to let the medication team know when enough is enough.  I shared that we will continue to care for her, but we need to hear from her how she wants to be cared for.  I share that "things are looking different now".   The natural disease trajectory and expectations at EOL were discussed.  Advanced directives, concepts specific to code status, artifical feeding and hydration, and rehospitalization were considered and discussed.  DNR verified.  Will accept BiPAP.    Discussed the importance of continued conversation with family and the medical providers regarding overall plan of care and treatment options, ensuring decisions are within the context of the patient's values and GOCs.  Questions and concerns were addressed.  The family was encouraged to call with questions or concerns.  PMT will continue to support holistically.  Conference with attending, bedside nursing staff, and [facility] team related to patient condition, needs, [place] and disposition.     HCPOA  NEXT OF KIN - son, [name]    SUMMARY OF RECOMMENDATIONS   At this point continue to treat the treatable but no CPR or intubation.   Agreeable to BiPAP if needed. Time  for outcomes. Historically returns to her own home. During previous PMT visit, preferred place of death is hospital.  Code Status/Advance Care Planning: DNR -DNR/goldenrod form completed and placed on chart.  Symptom Management:  Per hospitalist, no additional needs at this time.   Palliative Prophylaxis:  Frequent Pain Assessment, Oral Care, and Turn Reposition  Additional Recommendations (Limitations, Scope, Preferences): Continue to treat, no CPR or intubation, OK with BiPAP  Psycho-social/Spiritual:  Desire for further Chaplaincy support:no Additional Recommendations: Caregiving  Support/Resources and Education on Hospice  Prognosis:  Unable to determine, guarded.  3 months or less would not be surprising.   Discharge Planning:  To be  determined, usually goes home with son.        Primary Diagnoses: Present on Admission:  Respiratory failure with hypoxia and hypercapnia (HCC)  Acute on chronic respiratory failure with hypoxia (HCC)  GERD (gastroesophageal reflux disease)  Essential hypertension   I have reviewed the medical record, interviewed the patient and family, and examined the patient. The following aspects are pertinent.  Past Medical History:  Diagnosis Date   Arthritis    Asthma    COPD (chronic obstructive pulmonary disease) ([place])    Diabetes mellitus without complication (HCC)    GERD (gastroesophageal reflux disease)    Headache    Hyperlipidemia    Hypertension    Macular degeneration    Neuropathy    Obesity    Osteopenia    Stress incontinence    Social History   Socioeconomic History   Marital status: Single    Spouse name: Not on file   Number of children: Not on file   Years of education: Not on file   Highest education level: Not on file  Occupational History   Not on file  Tobacco Use   Smoking status: Former    Packs/day: 2.00    Years: 20.00    Total pack years: 40.00    Types: Cigarettes    Quit date: 02/25/2002    Years since quitting: 19.8   Smokeless tobacco: Never  Vaping Use   Vaping Use: Never used  Substance and Sexual Activity   Alcohol use: No   Drug use: No   Sexual activity: Not Currently    Birth control/protection: Post-menopausal  Other Topics Concern   Not on file  Social History Narrative   Not on file   Social Determinants of Health   Financial Resource Strain: Not on file  Food Insecurity: No Food Insecurity (01/07/2022)   Hunger Vital Sign    Worried About Running Out of Food in the Last Year: Never true    Ran Out of Food in the Last Year: Never true  Transportation Needs: No Transportation Needs (12/27/2021)   PRAPARE - Hydrologist (Medical): No    Lack of Transportation (Non-Medical): No  Physical  Activity: Not on file  Stress: Not on file  Social Connections: Not on file   Family History  Problem Relation Age of Onset   Melanoma Brother    Colon cancer Neg Hx    Scheduled Meds:  amoxicillin-clavulanate  1 tablet Oral Q12H   apixaban  5 mg Oral BID   atorvastatin  80 mg Oral Daily   Chlorhexidine Gluconate Cloth  6 each Topical Daily   dextromethorphan-guaiFENesin  1 tablet Oral BID   ferrous sulfate  325 mg Oral Q breakfast   fluconazole  200 mg Oral Daily   gabapentin  100 mg Oral TID  insulin aspart  0-20 Units Subcutaneous TID WC   insulin aspart  0-5 Units Subcutaneous QHS   insulin glargine-yfgn  28 Units Subcutaneous Daily   ipratropium-albuterol  3 mL Nebulization Q6H   methylPREDNISolone (SOLU-MEDROL) injection  40 mg Intravenous Q12H   metoprolol tartrate  50 mg Oral BID   mometasone-formoterol  2 puff Inhalation BID   mouth rinse  15 mL Mouth Rinse 4 times per day   pantoprazole  40 mg Oral Daily   polyethylene glycol  17 g Oral Daily   polyvinyl alcohol  2 drop Both Eyes QID   senna-docusate  2 tablet Oral QHS   sodium chloride flush  3 mL Intravenous Q12H   sodium chloride flush  3 mL Intravenous Q12H   umeclidinium bromide  1 puff Inhalation Daily   Continuous Infusions:  sodium chloride     sodium chloride 100 mL/hr at 01/14/22 1200   PRN Meds:.sodium chloride, acetaminophen **OR** acetaminophen, albuterol, albuterol, bisacodyl, ondansetron **OR** ondansetron (ZOFRAN) IV, ondansetron, polyethylene glycol, sodium chloride flush, traZODone Medications Prior to Admission:  Prior to Admission medications   Medication Sig Start Date End Date Taking? Authorizing Provider  apixaban (ELIQUIS) 5 MG TABS tablet Take 1 tablet (5 mg total) by mouth 2 (two) times daily. 01/12/22  Yes Roxan Hockey, MD  acetaminophen (TYLENOL) 325 MG tablet Take 2 tablets (650 mg total) by mouth every 6 (six) hours as needed for mild pain (or Fever >/= 101). 01/12/22   Roxan Hockey, MD  albuterol (PROVENTIL) (2.5 MG/3ML) 0.083% nebulizer solution Take 3 mLs (2.5 mg total) by nebulization every 6 (six) hours as needed for wheezing or shortness of breath. 01/12/22   Roxan Hockey, MD  albuterol-ipratropium (COMBIVENT) 18-103 MCG/ACT inhaler Inhale 2 puffs into the lungs every 6 (six) hours as needed for wheezing. 01/12/22   Roxan Hockey, MD  amoxicillin-clavulanate (AUGMENTIN) 875-125 MG tablet Take 1 tablet by mouth 2 (two) times daily for 5 days. 01/12/22 01/17/22  Roxan Hockey, MD  atorvastatin (LIPITOR) 80 MG tablet ALTERNATE TAKING 1 TABLET EVERY OTHER DAY WITH 1/2 TABLET EVERY OTHERDAY 01/13/14   Chipper Herb, MD  Biotin w/ Vitamins C & E (HAIR/SKIN/NAILS PO) Take 1 tablet by mouth daily. Takes once a day.    [provider]  Budeson-Glycopyrrol-Formoterol (BREZTRI AEROSPHERE) 160-9-4.8 MCG/ACT AERO Inhale 2 puffs into the lungs in the morning and at bedtime. 03/08/21   Chesley Mires, MD  dextromethorphan-guaiFENesin (MUCINEX DM) 30-600 MG 12hr tablet Take 1 tablet by mouth 2 (two) times daily for 10 days. 01/12/22 01/22/22  Roxan Hockey, MD  docusate sodium (COLACE) 100 MG capsule Take 1 capsule (100 mg total) by mouth 2 (two) times daily. 01/12/22 01/12/23  Roxan Hockey, MD  ferrous sulfate 325 (65 FE) MG tablet Take 1 tablet (325 mg total) by mouth daily. 12/04/21 12/04/22  Manuella Ghazi, Pratik D, DO  fluconazole (DIFLUCAN) 200 MG tablet Take 1 tablet (200 mg total) by mouth daily. 01/12/22   Roxan Hockey, MD  fluticasone (FLONASE) 50 MCG/ACT nasal spray Place 1 spray into both nostrils daily. 10/19/20   Chesley Mires, MD  gabapentin (NEURONTIN) 100 MG capsule Take 1 capsule (100 mg total) by mouth 3 (three) times daily. 12/20/21   Johnson, Clanford L, MD  glimepiride (AMARYL) 2 MG tablet Take 4 mg by mouth daily with breakfast.     [provider]  guaiFENesin (MUCINEX) 600 MG 12 hr tablet Take 2 tablets (1,200 mg total) by mouth 2  (two) times daily  as needed for cough or to loosen phlegm. 01/12/22   Roxan Hockey, MD  Hypromellose (ARTIFICIAL TEARS OP) Place 1 drop into both eyes 4 (four) times daily.    [provider]  LANTUS SOLOSTAR 100 UNIT/ML Solostar Pen Inject 20 Units into the skin in the morning and at bedtime. 11/22/20   [provider]  losartan-hydrochlorothiazide (HYZAAR) 100-12.5 MG per tablet TAKE ONE (1) TABLET EACH DAY Patient taking differently: Take 1 tablet by mouth daily. 03/09/14   Chipper Herb, MD  metoprolol (LOPRESSOR) 50 MG tablet Take 50 mg by mouth 2 (two) times daily.    [provider]  montelukast (SINGULAIR) 10 MG tablet TAKE ONE TABLET BY MOUTH AT BEDTIME 07/30/21   Chesley Mires, MD  Multiple Vitamins-Minerals (CENTRUM SILVER PO) Take 1 tablet by mouth daily.    [provider]  Omega-3 Fatty Acids (FISH OIL) 1000 MG CAPS Take 1,000 mg by mouth in the morning, at noon, and at bedtime.    [provider]  ondansetron (ZOFRAN) 4 MG tablet Take 1 tablet (4 mg total) by mouth every 6 (six) hours as needed for nausea. 01/12/22   Roxan Hockey, MD  oxybutynin (DITROPAN) 5 MG tablet Take 5 mg by mouth every 8 (eight) hours as needed for bladder spasms.     [provider]  pantoprazole (PROTONIX) 40 MG tablet Take 1 tablet (40 mg total) by mouth daily. 01/12/22   Roxan Hockey, MD  polyethylene glycol (MIRALAX / GLYCOLAX) 17 g packet Take 17 g by mouth daily as needed for mild constipation. 12/20/21   Johnson, Clanford L, MD  predniSONE (DELTASONE) 10 MG tablet Take 1 tablet (10 mg total) by mouth daily with breakfast. Take 40 mg (4 tab) daily for 3 days , then 30 mg (3 Tab) daily for 3 days, then Take 20 mg (2 Tab) daily for 3 days, then Take 10 mg (10 mg) daily for 3 days... Then STOP 01/12/22   Roxan Hockey, MD  sitaGLIPtin-metformin (JANUMET) 50-1000 MG tablet Take 1 tablet by mouth 2 (two) times daily with a meal.    [provider]  triamcinolone cream (KENALOG) 0.1 % Apply 1 application topically daily as needed (eczema).    [provider]   Allergies  Allergen Reactions   Onion     wheezing   Review of Systems  Unable to perform ROS: Age    Physical Exam Vitals and nursing note reviewed.  Constitutional:      General: She is not in acute distress.    Appearance: She is obese. She is ill-appearing.  HENT:     Mouth/Throat:     Mouth: Mucous membranes are moist.  Cardiovascular:     Rate and Rhythm: Normal rate.  Pulmonary:     Effort: Pulmonary effort is normal. No respiratory distress.  Skin:    General: Skin is warm and dry.     Coloration: Skin is pale.  Neurological:     Mental Status: She is alert and oriented to person, place, and time.  Psychiatric:        Mood and Affect: Mood normal.        Behavior: Behavior normal.     Vital Signs: BP (!) 153/56   Pulse 97   Temp (!) 97.2 F (36.2 C) (Oral)   Resp (!) 30   Ht _0  (1.651 m)   Wt 75.7 kg   SpO2 (!) 89%   BMI 27.77 kg/m  Pain Scale:  0-10 POSS *See Group Information*: 1-Acceptable,Awake and alert Pain Score: 0-No pain   SpO2: SpO2: (!) 89 % O2 Device:SpO2: (!) 89 % O2 Flow Rate: .O2 Flow Rate (L/min): 8 L/min  IO: Intake/output summary:  Intake/Output Summary (Last 24 hours) at 01/14/2022 1211 Last data filed at 01/14/2022 1159 Gross per 24 hour  Intake 2436.2 ml  Output 1200 ml  Net 1236.2 ml    LBM: Last BM Date : 01/12/22 Baseline Weight: Weight: 74.5 kg Most recent weight: Weight: 75.7 kg     Palliative Assessment/Data:   Flowsheet Rows    Flowsheet Row Most Recent Value  Intake Tab   Referral Department Hospitalist  Unit at Time of Referral Intermediate Care Unit  Palliative Care Primary Diagnosis Pulmonary  Date Notified 01/20/2022  Palliative Care Type Return patient Palliative Care  Reason for referral Clarify Goals of Care  Date of Admission 01/02/2022  Date first seen by  Palliative Care 01/14/22  # of days Palliative referral response time 1 Day(s)  # of days IP prior to Palliative referral 0  Clinical Assessment   Palliative Performance Scale Score 30%  Pain Max last 24 hours Not able to report  Pain Min Last 24 hours Not able to report  Dyspnea Max Last 24 Hours Not able to report  Dyspnea Min Last 24 hours Not able to report  Psychosocial & Spiritual Assessment   Palliative Care Outcomes        Time In: 0940 Time Out: 1035 Time Total: 55 minutes  Greater than 50%  of this time was spent counseling and coordinating care related to the above assessment and plan.  Signed by: Drue Novel, NP   Please contact Palliative Medicine Team phone at [phone] for questions and concerns.  For individual provider: See Shea Evans

## 2022-01-14 NOTE — TOC Initial Note (Signed)
Transition of Care [facility]) - Initial/Assessment Note    Patient Details  Name: Michaela Moon MRN: [id number] Date of Birth: [birth year]  [facility] CM/SW Contact:    Salome Arnt, [place] Phone Number: 01/14/2022, 7:55 AM  Clinical Narrative:  Pt admitted with acute on chronic respiratory failure with hypoxia. Pt has high readmission risk score. Pt well known to TOC from previous admissions.                  Pt admitted from home. She lives with her son. Pt is active with [facility] for RN and PT. Pt has a walker, BSC, and O2 for DME at home. [name] takes her to appointments and they state that they do not have any difficulty obtaining pt's medications as needed.   TOC will continue to follow to assist with d/c planning. Cory with [facility] notified of admission. Palliative consult pending.  Expected Discharge Plan: [place] Barriers to Discharge: Continued Medical Work up   Patient Goals and CMS Choice Patient states their goals for this hospitalization and ongoing recovery are:: anticipate return home      Expected Discharge Plan and Services Expected Discharge Plan: [place] In-house Referral: Clinical Social Work     Living arrangements for the past 2 months: [place] Expected Discharge Date: 2022/02/18                         HH Arranged: PT, RN [place] Agency: Alvis Lemmings Date [place]: 01/14/22 Time HH Agency Contacted: [phone] Representative spoke with at [place]: [place] Arrangements/Services Living arrangements for the past 2 months: [place] with:: Adult Children Patient language and need for interpreter reviewed:: Yes Do you feel safe going back to the place where you live?: Yes      Need for Family Participation in Patient Care: Yes (Comment) Care giver support system in place?: Yes (comment) Current home services: DME, Home PT, Home RN Criminal Activity/Legal  Involvement Pertinent to Current Situation/Hospitalization: No - Comment as needed  Activities of Daily Living Home Assistive Devices/Equipment: Walker (specify type) ADL Screening (condition at time of admission) Patient's cognitive ability adequate to safely complete daily activities?: Yes Is the patient deaf or have difficulty hearing?: No Does the patient have difficulty seeing, even when wearing glasses/contacts?: No Does the patient have difficulty concentrating, remembering, or making decisions?: No Patient able to express need for assistance with ADLs?: Yes Does the patient have difficulty dressing or bathing?: No Independently performs ADLs?: Yes (appropriate for developmental age) Communication: Needs assistance Is this a change from baseline?: Pre-admission baseline Dressing (OT): Needs assistance Is this a change from baseline?: Pre-admission baseline Grooming: Needs assistance Is this a change from baseline?: Pre-admission baseline Feeding: Needs assistance Is this a change from baseline?: Pre-admission baseline Bathing: Needs assistance Is this a change from baseline?: Pre-admission baseline Toileting: Needs assistance Is this a change from baseline?: Pre-admission baseline In/Out Bed: Needs assistance Is this a change from baseline?: Pre-admission baseline Walks in Home: Needs assistance Is this a change from baseline?: Pre-admission baseline Does the patient have difficulty walking or climbing stairs?: Yes Weakness of Legs: Both Weakness of Arms/Hands: Both  Permission Sought/Granted                  Emotional Assessment         Alcohol / Substance Use: Not Applicable Psych  Involvement: No (comment)  Admission diagnosis:  Acute respiratory failure with hypoxia (HCC) [J96.01] Respiratory failure with hypoxia and hypercapnia ([place]) [J96.91, J96.92] Patient Active Problem List   Diagnosis Date Noted   Respiratory failure with hypoxia and hypercapnia  ([place]) 01/17/2022   Chronic diastolic HF (heart failure) ([place]) 01/04/2022   Acute on chronic respiratory failure with hypoxia ([place]) 12/25/2021   Hypoglycemia 12/25/2021   Insulin dependent type 2 diabetes mellitus ([place]) 12/25/2021   Hypothermia 20/60/1561   Acute diastolic CHF (congestive heart failure) ([place]) 12/16/2021   Lactic acidosis 12/07/2021   Iron deficiency anemia 12/07/2021   Hypoalbuminemia due to protein-calorie malnutrition ([place]) 12/07/2021   Elevated brain natriuretic peptide (BNP) level 12/07/2021   Pressure injury of skin 11/29/2021   Acute respiratory failure with hypoxemia (HCC)    Recurrent pneumonia    Multifocal pneumonia 11/28/2021   Acute respiratory failure with hypoxia ([place]) 11/28/2021   Lung nodule 11/28/2021   AKI (acute kidney injury) ([place]) 11/28/2021   Carpal tunnel syndrome of right wrist    S/P carpal tunnel release left 10/23/17 right 11/20/17    Gastritis and gastroduodenitis 08/16/2016   Erosive gastritis    GERD (gastroesophageal reflux disease) 04/15/2016   Dysphagia 04/15/2016   Acute exacerbation of chronic obstructive pulmonary disease (COPD) ([place])    Pneumonia 01/29/2013   Hypercalcemia 12/24/2012   Pain in joint, ankle and foot 11/27/2012   Peripheral neuropathy 11/27/2012   Essential hypertension    Mixed hyperlipidemia    Diabetes mellitus without complication ([place])    Arthritis    Obesity    Osteopenia    PCP:  [name], MD Pharmacy:   [place], [place] [address] Phone: [phone] Fax: [phone]     Social Determinants of Health (SDOH) Interventions    Readmission Risk Interventions    01/14/2022    7:53 AM 01/07/2022   11:55 AM 12/31/2021    3:22 PM  Readmission Risk Prevention Plan  Transportation Screening Complete Complete Complete  PCP or Specialist Appt within 5-7 Days   Complete  Home Care Screening   Complete  Medication Review (RN CM)    Complete  HRI or Home Care Consult  Complete   Social Work Consult for [place] Planning/Counseling  Complete   Palliative Care Screening  Complete   Medication Review Press photographer) Complete Complete   HRI or [place] Complete    SW Recovery Care/Counseling Consult Complete    [place] Not Applicable

## 2022-01-15 DIAGNOSIS — J9621 Acute and chronic respiratory failure with hypoxia: Secondary | ICD-10-CM | POA: Diagnosis not present

## 2022-01-15 DIAGNOSIS — R1314 Dysphagia, pharyngoesophageal phase: Secondary | ICD-10-CM | POA: Diagnosis not present

## 2022-01-15 DIAGNOSIS — Z7189 Other specified counseling: Secondary | ICD-10-CM | POA: Diagnosis not present

## 2022-01-15 DIAGNOSIS — Z515 Encounter for palliative care: Secondary | ICD-10-CM | POA: Diagnosis not present

## 2022-01-15 DIAGNOSIS — I10 Essential (primary) hypertension: Secondary | ICD-10-CM | POA: Diagnosis not present

## 2022-01-15 DIAGNOSIS — J9622 Acute and chronic respiratory failure with hypercapnia: Secondary | ICD-10-CM | POA: Diagnosis not present

## 2022-01-15 DIAGNOSIS — E119 Type 2 diabetes mellitus without complications: Secondary | ICD-10-CM | POA: Diagnosis not present

## 2022-01-15 LAB — COMPREHENSIVE METABOLIC PANEL
ALT: 90 U/L — ABNORMAL HIGH (ref 0–44)
AST: 21 U/L (ref 15–41)
Albumin: 2.8 g/dL — ABNORMAL LOW (ref 3.5–5.0)
Alkaline Phosphatase: 63 U/L (ref 38–126)
Anion gap: 2 — ABNORMAL LOW (ref 5–15)
BUN: 19 mg/dL (ref 8–23)
CO2: 28 mmol/L (ref 22–32)
Calcium: 10.4 mg/dL — ABNORMAL HIGH (ref 8.9–10.3)
Chloride: 107 mmol/L (ref 98–111)
Creatinine, Ser: 0.49 mg/dL (ref 0.44–1.00)
GFR, Estimated: >60 mL/min (ref >60)
Glucose, Bld: 159 mg/dL — ABNORMAL HIGH (ref 70–99)
Potassium: 4.3 mmol/L (ref 3.5–5.1)
Sodium: 137 mmol/L (ref 135–145)
Total Bilirubin: 0.3 mg/dL (ref 0.3–1.2)
Total Protein: 5.3 g/dL — ABNORMAL LOW (ref 6.5–8.1)

## 2022-01-15 LAB — GLUCOSE, CAPILLARY
Glucose-Capillary: 137 mg/dL — ABNORMAL HIGH (ref 70–99)
Glucose-Capillary: 240 mg/dL — ABNORMAL HIGH (ref 70–99)
Glucose-Capillary: 198 mg/dL — ABNORMAL HIGH (ref 70–99)
Glucose-Capillary: 289 mg/dL — ABNORMAL HIGH (ref 70–99)

## 2022-01-15 MED ORDER — REVEFENACIN 175 MCG/3ML IN SOLN
175.0000 ug | Freq: Every day | RESPIRATORY_TRACT | Status: DC
  Administered 2022-01-15 – 2022-01-18 (×4): 175 ug via RESPIRATORY_TRACT
  Filled 2022-01-15 (×4): qty 3

## 2022-01-15 MED ORDER — BUDESONIDE 0.25 MG/2ML IN SUSP
0.2500 mg | Freq: Two times a day (BID) | RESPIRATORY_TRACT | Status: DC
  Administered 2022-01-15 – 2022-01-18 (×7): 0.25 mg via RESPIRATORY_TRACT
  Filled 2022-01-15 (×7): qty 2

## 2022-01-15 MED ORDER — HYDRALAZINE HCL 20 MG/ML IJ SOLN
10.0000 mg | Freq: Four times a day (QID) | INTRAMUSCULAR | Status: DC | PRN
  Administered 2022-01-15: 10 mg via INTRAVENOUS
  Filled 2022-01-15: qty 1

## 2022-01-15 MED ORDER — ARFORMOTEROL TARTRATE 15 MCG/2ML IN NEBU
15.0000 ug | INHALATION_SOLUTION | Freq: Two times a day (BID) | RESPIRATORY_TRACT | Status: DC
  Administered 2022-01-15 – 2022-01-18 (×7): 15 ug via RESPIRATORY_TRACT
  Filled 2022-01-15 (×7): qty 2

## 2022-01-15 NOTE — Progress Notes (Signed)
Palliative: Mrs. Salsberry is resting quietly in bed.  She appears acutely/chronically ill and quite frail, obese.  She is alert and oriented, able to make her needs known.  There is no family at bedside at this time.  We talked about her breathing.  Mrs. Wiegand shares that she did use BiPAP overnight and feels that it helped her.  She tells me that she feels that she is improving.  We talk about physical therapy evaluation for short-term rehab.  I shared that this may help her stay out of the hospital longer.  At this point, she is agreeable to PT eval and the possibility of short-term rehab.  We talk about her heart failure, respiratory failure, aspiration pneumonia.  We talk about what we can and cannot change.  Conference with attending, bedside nursing staff, transition of care team related to patient condition, needs, goals of care, disposition.  Plan: Continue to treat the treatable but no CPR or intubation.  Time for outcomes.  PT eval with possible short-term rehab.  Rehospitalize as needed.  19 minutes Quinn Axe, NP Palliative medicine team Team phone [phone] Greater than 50% of this time was spent counseling and coordinating care related to the above assessment and plan.

## 2022-01-15 NOTE — Progress Notes (Addendum)
PROGRESS NOTE     Michaela Moon, is a 86 y.o. female, DOB - [birth year], LGX:[id number]  Admit date - 01/05/2022   Admitting Physician Michaela Rohlman Denton Brick, MD  Outpatient Primary MD for the patient is Michaela Burly, MD  LOS - 2  Chief Complaint  Patient presents with   Respiratory Distress        Brief Narrative:  86 y.o. female who is a reformed smoker (quit in 2004 )with medical history significant of COPD, diabetes, hypertension, lower extremity DVT (on chronic Eliquis), diagnosis of COVID in on 10/31/21 and again on 01/04/22 (after testing negative on 11/28/2021 and 12/07/2021) and recurrent hospitalization for aspiration pneumonia and acute on chronic respiratory failure---chronically on 5 L nasal cannula supplementation  -Discharged home on 01/12/2022 -Readmitted on 01/16/2022 with acute on chronic hypoxic respiratory failure As per pt's son she did okay in the evening of 01/12/2022-----this morning she became acutely short of breath, she was found to be very hypoxic, EMS was called and she was placed on BiPAP - Patient appears to have significant recurrent respiratory problems since she suffered COVID back in September 2023      -Assessment and Plan: 1)Acute on chronic respiratory failure with hypoxia -suspect recurrent aspiration episodes and COPD exacerbation -CT angio chest shows  Extensive bilateral heterogeneous and consolidative airspace Opacities -Weaned off continuous BiPAP on 01/14/2022, , may continue as needed BiPAP and BiPAP nightly -Recent MRSA PCR negative --Continue bronchodilators and mucolytics -c/n  IV Solu-Medrol, continue augmentin and fluconazole. -Patient appears to have significant recurrent respiratory problems since she had COVID back in September 2023 01/15/22 -Did well with BiPAP overnight -Able to wean oxygen down to 7 L via nasal cannula -   2)Dysphagia--??? Reflux related -With a high risk for aspiration -Continue dysphagia 2 diet with nectar  thick liquids. -Education provided for upright position during meals and after meals to continue minimizing chance of aspiration. -[name] reports having hospital bed for her at home.   3)H/o Bil DVT-lower extremity venous Dopplers from 12/08/2021 noted -CTA angio chest from 01/05/2022, 12/08/2021 and repeat CTA chest 12/25/2021 without acute PE -Continue Eliquis twice daily   4)Chronic Diastolic HF (Heart Failure) (HCC) -No vascular congestion appreciated on x-ray or CT scan -Continue home medication regimen. -Maintain adequate hydration. -Patient chronically on HCTZ. -Continue to follow urine output.   5)Insulin dependent type 2 diabetes mellitus (HCC) --Recent A1c 6.3-reflecting excellent diabetic control PTA -Anticipate steroid-induced hyperglycemia -continue  levemir Use Novolog/Humalog Sliding scale insulin with Accu-Cheks/Fingersticks as ordered    6) social/ethics--- advanced directives discussed with patient and son, patient request DNR/DNI status   7)Pressure injury of skin--POA -No signs of superimposed infection -Provide preventive measures and constant repositioning.   8)GERD (gastroesophageal reflux disease) -Continue PPI -Dose adjusted to twice a day while receiving steroids for further GI prophylaxis.   9)Mixed hyperlipidemia -Continue statin   10)-HTN-c/n  metoprolol 50 mg twice daily   11) generalized weakness and deconditioning--- physical therapy eval appreciated recommends SNF rehab   Disposition: Anticipate that discharge to SNF rehab    Status is: Inpatient    Disposition: The patient is from: Home              Anticipated d/c is to:  SNF              Anticipated d/c date is: 2 days              Patient currently is not medically stable to d/c. Barriers: Not Clinically  Stable-   Code Status :  -  Code Status: DNR   Family Communication:   Discussed with Son  DVT Prophylaxis  :   - SCDs  SCDs Start: 01/23/2022 1311 Place TED hose Start:  01/22/2022 1311 apixaban (ELIQUIS) tablet 5 mg   Lab Results  Component Value Date   PLT 177 01/15/2022   Inpatient Medications  Scheduled Meds:  amoxicillin-clavulanate  1 tablet Oral Q12H   apixaban  5 mg Oral BID   arformoterol  15 mcg Nebulization BID   atorvastatin  80 mg Oral Daily   budesonide  0.25 mg Nebulization BID   Chlorhexidine Gluconate Cloth  6 each Topical Daily   dextromethorphan-guaiFENesin  1 tablet Oral BID   ferrous sulfate  325 mg Oral Q breakfast   fluconazole  200 mg Oral Daily   gabapentin  100 mg Oral TID   insulin aspart  0-20 Units Subcutaneous TID WC   insulin aspart  0-5 Units Subcutaneous QHS   insulin glargine-yfgn  28 Units Subcutaneous Daily   methylPREDNISolone (SOLU-MEDROL) injection  40 mg Intravenous Q12H   metoprolol tartrate  50 mg Oral BID   mouth rinse  15 mL Mouth Rinse 4 times per day   pantoprazole  40 mg Oral Daily   polyethylene glycol  17 g Oral Daily   polyvinyl alcohol  2 drop Both Eyes QID   revefenacin  175 mcg Nebulization Daily   senna-docusate  2 tablet Oral QHS   sodium chloride flush  3 mL Intravenous Q12H   sodium chloride flush  3 mL Intravenous Q12H   Continuous Infusions:  sodium chloride     sodium chloride 100 mL/hr at 01/15/22 1622   PRN Meds:.sodium chloride, acetaminophen **OR** acetaminophen, albuterol, albuterol, bisacodyl, hydrALAZINE, ondansetron **OR** ondansetron (ZOFRAN) IV, ondansetron, polyethylene glycol, sodium chloride flush, traZODone   Anti-infectives (From admission, onward)    Start     Dose/Rate Route Frequency Ordered Stop   12/27/2021 1315  amoxicillin-clavulanate (AUGMENTIN) 875-125 MG per tablet 1 tablet        1 tablet Oral Every 12 hours 01/24/2022 1302 01/20/22 0959   12/27/2021 1315  fluconazole (DIFLUCAN) tablet 200 mg        200 mg Oral Daily 01/08/2022 1312 01/18/22 0959       Subjective: Michaela Moon today has no fevers, no emesis,  No chest pain,   Son at bedside = Did well  with BiPAP overnight -Able to wean oxygen down to 7 L via nasal cannula -  Objective: Vitals:   01/15/22 1600 01/15/22 1700 01/15/22 1717 01/15/22 1739  BP: (!) 181/61 (!) 165/64  (!) 157/76  Pulse: 71 76 91 89  Resp: '17 14 15 17  '$ Temp:    98.3 F (36.8 C)  TempSrc:    Oral  SpO2: 92% 91% 92% 92%  Weight:      Height:        Intake/Output Summary (Last 24 hours) at 01/15/2022 1942 Last data filed at 01/15/2022 1841 Gross per 24 hour  Intake 1642.09 ml  Output 1750 ml  Net -107.91 ml   Filed Weights   01/20/2022 0949 01/08/2022 1339  Weight: 74.5 kg 75.7 kg   Physical Exam Gen:- Awake Alert,  some conversational dyspnea HEENT:- [place].AT, No sclera icterus, Nose-- [place] 7 L/min (able to come off BiPAP) Neck-Supple Neck,No JVD,.  Lungs--diminished breath sounds with scattered rhonchi and wheezes bilaterally CV- S1, S2 normal, regular , tachycardic Abd-  +ve B.Sounds, Abd Soft, No  tenderness,    Extremity/Skin:- No significant edema, pedal pulses present  Psych-affect is appropriate, oriented x3 Neuro-generalized weakness, no new focal deficits, no tremors   Data Reviewed: I have personally reviewed following labs and imaging studies  CBC: Recent Labs  Lab 01/09/22 0349 01/11/22 0329 01/07/2022 1004 01/15/22 0412  WBC 9.7 11.3* 27.4* 15.5*  NEUTROABS  --   --  20.8*  --   HGB 9.6* 10.1* 11.9* 9.2*  HCT 31.0* 32.0* 38.2 29.6*  MCV 88.3 87.4 88.8 88.6  PLT 165 199 269 038   Basic Metabolic Panel: Recent Labs  Lab 01/09/22 0349 01/11/22 0329 01/05/2022 1004 01/15/22 0412  NA 137 135 134* 137  K 4.7 4.8 4.2 4.3  CL 101 99 98 107  CO2 '30 27 24 28  '$ GLUCOSE 219* 238* 354* 159*  BUN 26* 25* 32* 19  CREATININE 0.61 0.61 0.94 0.49  CALCIUM 10.8* 10.5* 11.1* 10.4*   GFR: Estimated Creatinine Clearance: 53.3 mL/min (by C-G formula based on SCr of 0.49 mg/dL). Liver Function Tests: Recent Labs  Lab 01/22/2022 1004 01/15/22 0412  AST 59* 21  ALT 215* 90*  ALKPHOS 103  63  BILITOT 0.5 0.3  PROT 6.9 5.3*  ALBUMIN 3.7 2.8*   Radiology Studies: No results found.  Scheduled Meds:  amoxicillin-clavulanate  1 tablet Oral Q12H   apixaban  5 mg Oral BID   arformoterol  15 mcg Nebulization BID   atorvastatin  80 mg Oral Daily   budesonide  0.25 mg Nebulization BID   Chlorhexidine Gluconate Cloth  6 each Topical Daily   dextromethorphan-guaiFENesin  1 tablet Oral BID   ferrous sulfate  325 mg Oral Q breakfast   fluconazole  200 mg Oral Daily   gabapentin  100 mg Oral TID   insulin aspart  0-20 Units Subcutaneous TID WC   insulin aspart  0-5 Units Subcutaneous QHS   insulin glargine-yfgn  28 Units Subcutaneous Daily   methylPREDNISolone (SOLU-MEDROL) injection  40 mg Intravenous Q12H   metoprolol tartrate  50 mg Oral BID   mouth rinse  15 mL Mouth Rinse 4 times per day   pantoprazole  40 mg Oral Daily   polyethylene glycol  17 g Oral Daily   polyvinyl alcohol  2 drop Both Eyes QID   revefenacin  175 mcg Nebulization Daily   senna-docusate  2 tablet Oral QHS   sodium chloride flush  3 mL Intravenous Q12H   sodium chloride flush  3 mL Intravenous Q12H   Continuous Infusions:  sodium chloride     sodium chloride 100 mL/hr at 01/15/22 1622     LOS: 2 days   Roxan Hockey M.D on 01/15/2022 at 7:42 PM  Go to [email or web address] - for contact info  [facility] - Office  [phone]  If 7PM-7AM, please contact night-coverage [email or web address] 01/15/2022, 7:42 PM

## 2022-01-15 NOTE — Plan of Care (Signed)

## 2022-01-15 NOTE — [place] FL2 (Signed)
[place] MEDICAID FL2 LEVEL OF CARE SCREENING TOOL     IDENTIFICATION  Patient Name: Michaela Moon Birthdate: 26-Mar-1935 Sex: female Admission Date (Current Location): 01/14/2022  [facility] and [place] Number:  [employer] and Address:  [place] [address], [place]      Provider Number: [id number]  Attending Physician Name and Address:  Roxan Hockey, MD  Relative Name and Phone Number:       Current Level of Care:   Recommended Level of Care:   Prior Approval Number:    Date Approved/Denied:   PASRR Number: [id number]  Discharge Plan: SNF    Current Diagnoses: Patient Active Problem List   Diagnosis Date Noted   Respiratory failure with hypoxia and hypercapnia (HCC) 01/08/2022   Chronic diastolic HF (heart failure) ([place]) 01/04/2022   Acute on chronic respiratory failure with hypoxia ([place]) 12/25/2021   Hypoglycemia 12/25/2021   Insulin dependent type 2 diabetes mellitus ([place]) 12/25/2021   Hypothermia 73/53/2992   Acute diastolic CHF (congestive heart failure) ([place]) 12/16/2021   Lactic acidosis 12/07/2021   Iron deficiency anemia 12/07/2021   Hypoalbuminemia due to protein-calorie malnutrition ([place]) 12/07/2021   Elevated brain natriuretic peptide (BNP) level 12/07/2021   Pressure injury of skin 11/29/2021   Acute respiratory failure with hypoxemia (HCC)    Recurrent pneumonia    Multifocal pneumonia 11/28/2021   Acute respiratory failure with hypoxia ([place]) 11/28/2021   Lung nodule 11/28/2021   AKI (acute kidney injury) ([place]) 11/28/2021   Carpal tunnel syndrome of right wrist    S/P carpal tunnel release left 10/23/17 right 11/20/17    Gastritis and gastroduodenitis 08/16/2016   Erosive gastritis    GERD (gastroesophageal reflux disease) 04/15/2016   Dysphagia 04/15/2016   Acute exacerbation of chronic obstructive pulmonary disease (COPD) ([place])    Pneumonia 01/29/2013   Hypercalcemia 12/24/2012   Pain in joint,  ankle and foot 11/27/2012   Peripheral neuropathy 11/27/2012   Essential hypertension    Mixed hyperlipidemia    Diabetes mellitus without complication (HCC)    Arthritis    Obesity    Osteopenia     Orientation RESPIRATION BLADDER Height & Weight     Self, Time, Situation, Place  O2 (see dc summary) Continent Weight: 166 lb 14.2 oz (75.7 kg) Height:  '5\' 5"'$  (165.1 cm)  BEHAVIORAL SYMPTOMS/MOOD NEUROLOGICAL BOWEL NUTRITION STATUS      Continent Diet (see dc summary)  AMBULATORY STATUS COMMUNICATION OF NEEDS Skin   Extensive Assist Verbally Normal                       Personal Care Assistance Level of Assistance    Bathing Assistance: Limited assistance Feeding assistance: Independent Dressing Assistance: Limited assistance     Functional Limitations Info    Sight Info: Impaired Hearing Info: Adequate Speech Info: Adequate    SPECIAL CARE FACTORS FREQUENCY        PT Frequency: 5x week OT Frequency: 3x week            Contractures      Additional Factors Info    Code Status Info: DNR Allergies Info: Onion           Current Medications (01/15/2022):  This is the current hospital active medication list Current Facility-Administered Medications  Medication Dose Route Frequency Provider Last Rate Last Admin   0.9 %  sodium chloride infusion   Intravenous PRN Roxan Hockey, MD  0.9 %  sodium chloride infusion   Intravenous Continuous Emokpae, Courage, MD 100 mL/hr at 01/15/22 0908 Infusion Verify at 01/15/22 0908   acetaminophen (TYLENOL) tablet 650 mg  650 mg Oral Q6H PRN Roxan Hockey, MD   650 mg at 01/14/22 1208   Or   acetaminophen (TYLENOL) suppository 650 mg  650 mg Rectal Q6H PRN Emokpae, Courage, MD       albuterol (PROVENTIL) (2.5 MG/3ML) 0.083% nebulizer solution 2.5 mg  2.5 mg Nebulization Q2H PRN Emokpae, Courage, MD       albuterol (VENTOLIN HFA) 108 (90 Base) MCG/ACT inhaler 2 puff  2 puff Inhalation Q2H PRN Emokpae, Courage, MD        amoxicillin-clavulanate (AUGMENTIN) 875-125 MG per tablet 1 tablet  1 tablet Oral Q12H Emokpae, Courage, MD   1 tablet at 01/15/22 0827   apixaban (ELIQUIS) tablet 5 mg  5 mg Oral BID Emokpae, Courage, MD   5 mg at 01/15/22 0827   arformoterol (BROVANA) nebulizer solution 15 mcg  15 mcg Nebulization BID Emokpae, Courage, MD   15 mcg at 01/15/22 0743   atorvastatin (LIPITOR) tablet 80 mg  80 mg Oral Daily Emokpae, Courage, MD   80 mg at 01/15/22 0827   bisacodyl (DULCOLAX) suppository 10 mg  10 mg Rectal Daily PRN Emokpae, Courage, MD       budesonide (PULMICORT) nebulizer solution 0.25 mg  0.25 mg Nebulization BID Emokpae, Courage, MD   0.25 mg at 01/15/22 0744   Chlorhexidine Gluconate Cloth 2 % PADS 6 each  6 each Topical Daily Emokpae, Courage, MD   6 each at 01/15/22 0828   dextromethorphan-guaiFENesin ([place] DM) 30-600 MG per 12 hr tablet 1 tablet  1 tablet Oral BID Roxan Hockey, MD   1 tablet at 01/15/22 0827   ferrous sulfate tablet 325 mg  325 mg Oral Q breakfast Emokpae, Courage, MD   325 mg at 01/15/22 0827   fluconazole (DIFLUCAN) tablet 200 mg  200 mg Oral Daily Emokpae, Courage, MD   200 mg at 01/15/22 0827   gabapentin (NEURONTIN) capsule 100 mg  100 mg Oral TID Roxan Hockey, MD   100 mg at 01/15/22 0827   hydrALAZINE (APRESOLINE) injection 10 mg  10 mg Intravenous Q6H PRN Adefeso, Oladapo, DO   10 mg at 01/15/22 0534   insulin aspart (novoLOG) injection 0-20 Units  0-20 Units Subcutaneous TID WC Emokpae, Courage, MD   7 Units at 01/15/22 1130   insulin aspart (novoLOG) injection 0-5 Units  0-5 Units Subcutaneous QHS Emokpae, Courage, MD   2 Units at 01/14/22 2153   insulin glargine-yfgn (SEMGLEE) injection 28 Units  28 Units Subcutaneous Daily Emokpae, Courage, MD   28 Units at 01/15/22 1130   methylPREDNISolone sodium succinate (SOLU-MEDROL) 40 mg/mL injection 40 mg  40 mg Intravenous Q12H Emokpae, Courage, MD   40 mg at 01/15/22 1131   metoprolol tartrate (LOPRESSOR)  tablet 50 mg  50 mg Oral BID Emokpae, Courage, MD   50 mg at 01/15/22 0827   ondansetron (ZOFRAN) tablet 4 mg  4 mg Oral Q6H PRN Emokpae, Courage, MD       Or   ondansetron (ZOFRAN) injection 4 mg  4 mg Intravenous Q6H PRN Emokpae, Courage, MD       ondansetron (ZOFRAN) tablet 4 mg  4 mg Oral Q6H PRN Emokpae, Courage, MD       Oral care mouth rinse  15 mL Mouth Rinse 4 times per day Roxan Hockey, MD  15 mL at 01/15/22 1131   pantoprazole (PROTONIX) EC tablet 40 mg  40 mg Oral Daily Emokpae, Courage, MD   40 mg at 01/15/22 0827   polyethylene glycol (MIRALAX / GLYCOLAX) packet 17 g  17 g Oral Daily Emokpae, Courage, MD   17 g at 01/15/22 0828   polyethylene glycol (MIRALAX / GLYCOLAX) packet 17 g  17 g Oral Daily PRN Emokpae, Courage, MD       polyvinyl alcohol (LIQUIFILM TEARS) 1.4 % ophthalmic solution 2 drop  2 drop Both Eyes QID Emokpae, Courage, MD   2 drop at 01/15/22 1304   revefenacin (YUPELRI) nebulizer solution 175 mcg  175 mcg Nebulization Daily Emokpae, Courage, MD   175 mcg at 01/15/22 0744   senna-docusate (Senokot-S) tablet 2 tablet  2 tablet Oral QHS Emokpae, Courage, MD   2 tablet at 01/14/22 2151   sodium chloride flush (NS) 0.9 % injection 3 mL  3 mL Intravenous Q12H Emokpae, Courage, MD   3 mL at 01/14/22 2152   sodium chloride flush (NS) 0.9 % injection 3 mL  3 mL Intravenous Q12H Emokpae, Courage, MD   3 mL at 01/15/22 0828   sodium chloride flush (NS) 0.9 % injection 3 mL  3 mL Intravenous PRN Emokpae, Courage, MD       traZODone (DESYREL) tablet 50 mg  50 mg Oral QHS PRN Roxan Hockey, MD         Discharge Medications: Please see discharge summary for a list of discharge medications.  Relevant Imaging Results:  Relevant Lab Results:   Additional Information SSN: [place]  Shade Flood, LCSW

## 2022-01-15 NOTE — Plan of Care (Signed)
  Problem: Acute Rehab PT Goals(only PT should resolve) Goal: Pt Will Go Supine/Side To Sit Flowsheets (Taken 01/15/2022 1204) Pt will go Supine/Side to Sit: with modified independence Goal: Patient Will Transfer Sit To/From Stand Flowsheets (Taken 01/15/2022 1204) Patient will transfer sit to/from stand: with modified independence Goal: Pt Will Transfer Bed To Chair/Chair To Bed Flowsheets (Taken 01/15/2022 1204) Pt will Transfer Bed to Chair/Chair to Bed: with modified independence Goal: Pt Will Ambulate Flowsheets (Taken 01/15/2022 1204) Pt will Ambulate:  with supervision  50 feet  25 feet  with rolling walker   Michaela Moon, SPT

## 2022-01-15 NOTE — Evaluation (Addendum)
Physical Therapy Evaluation Patient Details Name: Michaela Moon MRN: [id number] DOB: [birth year] Today's Date: 01/15/2022  History of Present Illness  Michaela Moon is an 86 y.o. female who is a reformed smoker (quit in 2004 ) with medical history significant of COPD, diabetes, hypertension, lower extremity DVT (on chronic Eliquis), diagnosis of COVID in on 10/31/21 and again on 01/04/22 (after testing negative on 11/28/2021 and 12/07/2021) and recurrent hospitalization for aspiration pneumonia and acute on chronic respiratory failure---chronically on 5 L nasal cannula supplementation   -Discharged home on 01/12/2022  -Readmitted on 01/22/2022 with acute on chronic hypoxic respiratory failure  As per pt's son she did okay in the evening of 01/12/2022-----this morning she became acutely short of breath, she was found to be very hypoxic, EMS was called and she was placed on BiPAP.   Clinical Impression  Patient presents on 8 LPM O2, RN in room decreased O2 to 6 LPM with patient's SpO2 at 91%. Patient able to sit up at bedside with hand held assist to elevate trunk. Patient able to stand and transfer to chair with min guard/RW, and take several steps forward/backward in room with verbal cueing for walker placement with fair/good carryover, limited mostly due to fatigue and decreased endurance. Patient's SpO2 averaging 93% with activity and at 92% at rest following ambulation. Patient left on 6 LPM and tolerated sitting up in chair after therapy with son in room. Patient will benefit from continued skilled physical therapy in hospital and recommended venue below to increase strength, balance, endurance for safe ADLs and gait.     Recommendations for follow up therapy are one component of a multi-disciplinary discharge planning process, led by the attending physician.  Recommendations may be updated based on patient status, additional functional criteria and insurance authorization.  Follow Up Recommendations  Skilled nursing-short term rehab (<3 hours/day) Can patient physically be transported by private vehicle: Yes    Assistance Recommended at Discharge Intermittent Supervision/Assistance  Patient can return home with the following  A little help with walking and/or transfers;A little help with bathing/dressing/bathroom;Help with stairs or ramp for entrance;Assistance with cooking/housework    Equipment Recommendations None recommended by PT  Recommendations for Other Services       Functional Status Assessment Patient has had a recent decline in their functional status and demonstrates the ability to make significant improvements in function in a reasonable and predictable amount of time.     Precautions / Restrictions Precautions Precautions: Fall Precaution Comments: high supplemental O2 needs Restrictions Weight Bearing Restrictions: No      Mobility  Bed Mobility Overal bed mobility: Modified Independent Bed Mobility: Supine to Sit     Supine to sit: Supervision, HOB elevated     General bed mobility comments: slightly increased time, HOB elevated due to pt having hospital bed at home    Transfers Overall transfer level: Modified independent Equipment used: Rolling walker (2 wheels) Transfers: Sit to/from Stand, Bed to chair/wheelchair/BSC Sit to Stand: Min guard   Step pivot transfers: Min guard            Ambulation/Gait Ambulation/Gait assistance: Supervision Gait Distance (Feet): 20 Feet Assistive device: Rolling walker (2 wheels) Gait Pattern/deviations: Decreased step length - right, Decreased step length - left, Decreased stride length Gait velocity: decreased     General Gait Details: slow labored cadence with RW/supervision, limited mostly due to fatigue and decreased endurance. Patient's SpO2 >90% on 8 LPM O2, RN in room decreased O2 to 6 LPM with pt averaging  93% during activity, 92% at rest, pt left on 6 LPM  Stairs            Wheelchair  Mobility    Modified Rankin (Stroke Patients Only)       Balance Overall balance assessment: Needs assistance Sitting-balance support: No upper extremity supported, Feet supported Sitting balance-Leahy Scale: Good Sitting balance - Comments: seated EOB   Standing balance support: During functional activity, Bilateral upper extremity supported, Reliant on assistive device for balance Standing balance-Leahy Scale: Good Standing balance comment: with RW                             Pertinent Vitals/Pain Pain Assessment Pain Assessment: No/denies pain    Home Living Family/patient expects to be discharged to:: Private residence Living Arrangements: Children Available Help at Discharge: Family;Available 24 hours/day Type of Home: Mobile home Home Access: Stairs to enter Entrance Stairs-Rails: None Entrance Stairs-Number of Steps: 1 step   Home Layout: One level Home Equipment: Standard Walker;BSC/3in1;Wheelchair - manual;Cane - single point;Hospital bed Additional Comments: hospital bed in living room    Prior Function Prior Level of Function : Needs assist       Physical Assist : Mobility (physical);ADLs (physical) Mobility (physical): Bed mobility;Transfers;Gait;Stairs   Mobility Comments: household and very short distanced community using standard walker PRN ADLs Comments: assisted by family     Hand Dominance        Extremity/Trunk Assessment   Upper Extremity Assessment Upper Extremity Assessment: Generalized weakness    Lower Extremity Assessment Lower Extremity Assessment: Generalized weakness    Cervical / Trunk Assessment Cervical / Trunk Assessment: Normal  Communication   Communication: No difficulties  Cognition Arousal/Alertness: Awake/alert Behavior During Therapy: WFL for tasks assessed/performed Overall Cognitive Status: Within Functional Limits for tasks assessed                                           General Comments      Exercises     Assessment/Plan    PT Assessment Patient needs continued PT services  PT Problem List Decreased strength;Decreased activity tolerance;Decreased balance;Decreased mobility       PT Treatment Interventions DME instruction;Gait training;Stair training;Functional mobility training;Therapeutic activities;Therapeutic exercise;Patient/family education;Balance training    PT Goals (Current goals can be found in the Care Plan section)  Acute Rehab PT Goals Patient Stated Goal: return home after rehab PT Goal Formulation: With patient/family Time For Goal Achievement: 01/29/22 Potential to Achieve Goals: Good    Frequency Min 3X/week     Co-evaluation               AM-PAC PT "6 Clicks" Mobility  Outcome Measure Help needed turning from your back to your side while in a flat bed without using bedrails?: None Help needed moving from lying on your back to sitting on the side of a flat bed without using bedrails?: None Help needed moving to and from a bed to a chair (including a wheelchair)?: A Little Help needed standing up from a chair using your arms (e.g., wheelchair or bedside chair)?: A Little Help needed to walk in hospital room?: A Little Help needed climbing 3-5 steps with a railing? : A Lot 6 Click Score: 19    End of Session Equipment Utilized During Treatment: Oxygen Activity Tolerance: Patient tolerated treatment well;Patient limited by fatigue  Patient left: in chair;with call bell/phone within reach;with family/visitor present Nurse Communication: Mobility status PT Visit Diagnosis: Unsteadiness on feet (R26.81);Other abnormalities of gait and mobility (R26.89);Muscle weakness (generalized) (M62.81)    Time: 2903-7955 PT Time Calculation (min) (ACUTE ONLY): 20 min   Charges:   PT Evaluation $PT Eval Moderate Complexity: 1 Mod PT Treatments $Therapeutic Activity: 8-22 mins        Zigmund Gottron, SPT

## 2022-01-15 NOTE — TOC Progression Note (Signed)
Transition of Care [facility]) - Progression Note    Patient Details  Name: Michaela Moon MRN: [id number] Date of Birth: Jul 10, 1935  Transition of Care [facility]) CM/SW Contact  Shade Flood, LCSW Phone Number: 01/15/2022, 1:08 PM  Clinical Narrative:     TOC following. PT recommending SNF rehab at dc. Spoke with pt and her son at bedside today to review dc planning and PT recommendations. Pt states she is agreeable to SNF bed search. [name] has concerns about the frequency with which the SNF staff would check on pt and her aspiration/O2 levels and feels that he can do better for pt at home. Explained that if they would like TOC to start bed search in case they do want it at dc, TOC can do that and pt can still change the plan to return home with HH at dc. Explained to pt's son that once SNF bed options are available, TOC can arrange for him to speak with SNF rep to ask questions.  Pt and her son continue to be agreeable to bed search. CMS provider options reviewed. Will refer as requested.  TOC will follow.  Expected Discharge Plan: [place] Barriers to Discharge: Continued Medical Work up  Expected Discharge Plan and Services Expected Discharge Plan: [place] In-house Referral: Clinical Social Work   Post Acute Care Choice: [place] Living arrangements for the past 2 months: [place] Expected Discharge Date: 2022/02/13                         HH Arranged: PT, RN [place] Agency: [place] Date [facility] Agency Contacted: 01/14/22 Time [place]: [phone] Representative spoke with at [place]: [place] Determinants of Health ([place]) Interventions    Readmission Risk Interventions    01/14/2022    7:53 AM 01/07/2022   11:55 AM 12/31/2021    3:22 PM  Readmission Risk Prevention Plan  Transportation Screening Complete Complete Complete  PCP or Specialist Appt within 5-7 Days   Complete  Home Care  Screening   Complete  Medication Review (RN CM)   Complete  HRI or [place]  Complete   Social Work Consult for [place] Planning/Counseling  Complete   Palliative Care Screening  Complete   Medication Review Press photographer) Complete Complete   HRI or [place] Complete    SW Recovery Care/Counseling Consult Complete    [place] Not Applicable

## 2022-01-16 ENCOUNTER — Inpatient Hospital Stay (HOSPITAL_COMMUNITY): Payer: Medicare Other

## 2022-01-16 DIAGNOSIS — Z515 Encounter for palliative care: Secondary | ICD-10-CM | POA: Diagnosis not present

## 2022-01-16 DIAGNOSIS — Z7189 Other specified counseling: Secondary | ICD-10-CM | POA: Diagnosis not present

## 2022-01-16 DIAGNOSIS — K219 Gastro-esophageal reflux disease without esophagitis: Secondary | ICD-10-CM | POA: Diagnosis not present

## 2022-01-16 DIAGNOSIS — J9621 Acute and chronic respiratory failure with hypoxia: Secondary | ICD-10-CM | POA: Diagnosis not present

## 2022-01-16 DIAGNOSIS — J9622 Acute and chronic respiratory failure with hypercapnia: Secondary | ICD-10-CM | POA: Diagnosis not present

## 2022-01-16 DIAGNOSIS — R1314 Dysphagia, pharyngoesophageal phase: Secondary | ICD-10-CM | POA: Diagnosis not present

## 2022-01-16 LAB — GLUCOSE, CAPILLARY
Glucose-Capillary: 196 mg/dL — ABNORMAL HIGH (ref 70–99)
Glucose-Capillary: 231 mg/dL — ABNORMAL HIGH (ref 70–99)
Glucose-Capillary: 157 mg/dL — ABNORMAL HIGH (ref 70–99)
Glucose-Capillary: 181 mg/dL — ABNORMAL HIGH (ref 70–99)

## 2022-01-16 LAB — CBC
HCT: 29.6 % — ABNORMAL LOW (ref 36.0–46.0)
HCT: 32 % — ABNORMAL LOW (ref 36.0–46.0)
Hemoglobin: 9.2 g/dL — ABNORMAL LOW (ref 12.0–15.0)
Hemoglobin: 10 g/dL — ABNORMAL LOW (ref 12.0–15.0)
MCH: 27.5 pg (ref 26.0–34.0)
MCH: 27.5 pg (ref 26.0–34.0)
MCHC: 31.1 g/dL (ref 30.0–36.0)
MCHC: 31.3 g/dL (ref 30.0–36.0)
MCV: 88.6 fL (ref 80.0–100.0)
MCV: 88.2 fL (ref 80.0–100.0)
Platelets: 177 10*3/uL (ref 150–400)
Platelets: 178 10*3/uL (ref 150–400)
RBC: 3.34 MIL/uL — ABNORMAL LOW (ref 3.87–5.11)
RBC: 3.63 MIL/uL — ABNORMAL LOW (ref 3.87–5.11)
RDW: 22.9 % — ABNORMAL HIGH (ref 11.5–15.5)
RDW: 22.5 % — ABNORMAL HIGH (ref 11.5–15.5)
WBC: 15.5 10*3/uL — ABNORMAL HIGH (ref 4.0–10.5)
WBC: 21 10*3/uL — ABNORMAL HIGH (ref 4.0–10.5)
nRBC: 0 % (ref 0.0–0.2)
nRBC: 0 % (ref 0.0–0.2)

## 2022-01-16 LAB — BASIC METABOLIC PANEL
Anion gap: 3 — ABNORMAL LOW (ref 5–15)
BUN: 20 mg/dL (ref 8–23)
CO2: 26 mmol/L (ref 22–32)
Calcium: 10.4 mg/dL — ABNORMAL HIGH (ref 8.9–10.3)
Chloride: 108 mmol/L (ref 98–111)
Creatinine, Ser: 0.53 mg/dL (ref 0.44–1.00)
GFR, Estimated: >60 mL/min (ref >60)
Glucose, Bld: 135 mg/dL — ABNORMAL HIGH (ref 70–99)
Potassium: 4.2 mmol/L (ref 3.5–5.1)
Sodium: 137 mmol/L (ref 135–145)

## 2022-01-16 MED ORDER — INSULIN GLARGINE-YFGN 100 UNIT/ML ~~LOC~~ SOLN
30.0000 [IU] | Freq: Every day | SUBCUTANEOUS | Status: DC
  Administered 2022-01-16 – 2022-01-17 (×2): 30 [IU] via SUBCUTANEOUS
  Filled 2022-01-16 (×3): qty 0.3

## 2022-01-16 MED ORDER — INSULIN ASPART 100 UNIT/ML IJ SOLN
10.0000 [IU] | Freq: Three times a day (TID) | INTRAMUSCULAR | Status: DC
  Administered 2022-01-16 – 2022-01-18 (×5): 10 [IU] via SUBCUTANEOUS

## 2022-01-16 NOTE — Progress Notes (Signed)
NAME:  Michaela Moon, MRN:  [id number], DOB:  10-16-1935, LOS: 3 ADMISSION DATE:  01/04/2022, CONSULTATION DATE:  01/14/22 REFERRING MD: Margot Chimes, CHIEF COMPLAINT:  recurrent acute resp failure    History of Present Illness:  77 yowf  DNR  quit smoking 19 y PTA unable to do baseline pfts with profound FTT since covid pna 10/27/21 and 02 dep with concern re recurrent LPR/aspiration now on 5th  admit since covid with same presentation p d/c  01/12/22 and not seen by Dr Halford Chessman since  11/30/21 as outpt and Elsworth Soho 12/28/21 as inpt:  his imprression was: although better with prednisone, flares even while still on it so more likely recurrent aspiration" and pt DNR status/ d/c on  11/18 on pred taper and D2 diet / HOB at least 30 degrees  up  and 5lpm Nasal 02 [name] and lopressor 50 mg bid and readmitted same problems on 11/19 and PCCM service asked to re-eval am 11/20  Baseline = bed to [facility], has declined snf/rehab past admits  Says starts to feel more a more congested on d/c with no direct link to meals or overt aspiration, cp, fever N or V   Pertinent  Medical History  hypertension,  T2DM,  dysphagia requiring dilation 04/2016 for ? web COPD and chronic respiratory failure on 2 L of oxygen   Significant Hospital Events: Including procedures, antibiotic start and stop dates in addition to other pertinent events   Echo 93/11/21  Grade 1 diastolic dysfunction nl LA CTa 11/19 Extensive bilateral heterogeneous and consolidative airspace opacities, slightly improved compared to 01/04/2022.   Scheduled Meds:  amoxicillin-clavulanate  1 tablet Oral Q12H   apixaban  5 mg Oral BID   arformoterol  15 mcg Nebulization BID   atorvastatin  80 mg Oral Daily   budesonide  0.25 mg Nebulization BID   dextromethorphan-guaiFENesin  1 tablet Oral BID   ferrous sulfate  325 mg Oral Q breakfast   fluconazole  200 mg Oral Daily   gabapentin  100 mg Oral TID   insulin aspart  0-20 Units Subcutaneous TID WC    insulin aspart  0-5 Units Subcutaneous QHS   insulin aspart  10 Units Subcutaneous TID WC   insulin glargine-yfgn  30 Units Subcutaneous Daily   methylPREDNISolone (SOLU-MEDROL) injection  40 mg Intravenous Q12H   metoprolol tartrate  50 mg Oral BID   mouth rinse  15 mL Mouth Rinse 4 times per day   pantoprazole  40 mg Oral Daily   polyethylene glycol  17 g Oral Daily   polyvinyl alcohol  2 drop Both Eyes QID   revefenacin  175 mcg Nebulization Daily   senna-docusate  2 tablet Oral QHS   sodium chloride flush  3 mL Intravenous Q12H   sodium chloride flush  3 mL Intravenous Q12H   Continuous Infusions:  sodium chloride     sodium chloride Stopped (01/16/22 0950)   PRN Meds:.sodium chloride, acetaminophen **OR** acetaminophen, albuterol, albuterol, bisacodyl, hydrALAZINE, ondansetron **OR** ondansetron (ZOFRAN) IV, ondansetron, polyethylene glycol, sodium chloride flush, traZODone    Interim History / Subjective:  Sitting up at 60 degrees in bed eating supper/ D2 diet seems to be tol ok / congested rattling cough   Objective   Blood pressure 133/61, pulse 72, temperature (!) 97.4 F (36.3 C), temperature source Oral, resp. rate (!) 22, height _0  (1.651 m), weight 75.7 kg, SpO2 94 %.        Intake/Output Summary (Last 24 hours) at 01/16/2022  [place] filed at 01/15/2022 2000 Gross per 24 hour  Intake 226.42 ml  Output --  Net 226.42 ml   Filed Weights   01/08/2022 0949 01/12/2022 1339  Weight: 74.5 kg 75.7 kg    Examination: Tmax:  98.4 General appearance:    chronically ill but alert s increased wob  At Rest 02 sats  94% on 15lpm HFNC  No jvd Oropharynx clear,  mucosa nl Neck supple Lungs with a few scattered exp > insp rhonchi bilaterally, decreased bs bases  RRR no s3 or or sign murmur Abd obese with limited insp excursion  Extr warm with no edema or clubbing noted Neuro  Sensorium intact ,  no apparent motor deficits      I personally reviewed images and  agree with radiology impression as follows:  CXR:   portable 11/22 pm Stable to slight worsening of bilateral airspace disease suggestive of pneumonia.  Assessment & Plan:  1) acute on chronic resp failure s/p Covid pna 10/27/21 admit in pt with likely relatively mild copd  and complicated by likely intermittent aspiration   Lab Results  Component Value Date   ESRSEDRATE 18 01/14/2022   ESRSEDRATE 12 11/27/2012   >>>D 2 diet/ 02/ abx approp ? Need for prednisone at this point?  With Nl esr doubt BOOP and can taper off and recheck ESR one week p off to rule it out  Rec  Continue nebs/ avoid ICS due to pna ris   proceed with snf / wean 02 as tol / augmentin empirically ok  since 11/12             Labs   CBC: Recent Labs  Lab 01/11/22 0329 12/29/2021 1004 01/15/22 0412 01/16/22 0421  WBC 11.3* 27.4* 15.5* 21.0*  NEUTROABS  --  20.8*  --   --   HGB 10.1* 11.9* 9.2* 10.0*  HCT 32.0* 38.2 29.6* 32.0*  MCV 87.4 88.8 88.6 88.2  PLT 199 269 177 294    Basic Metabolic Panel: Recent Labs  Lab 01/11/22 0329 01/03/2022 1004 01/15/22 0412 01/16/22 0421  NA 135 134* 137 137  K 4.8 4.2 4.3 4.2  CL 99 98 107 108  CO2 _0 GLUCOSE 238* 354* 159* 135*  BUN 25* 32* 19 20  CREATININE 0.61 0.94 0.49 0.53  CALCIUM 10.5* 11.1* 10.4* 10.4*   GFR: Estimated Creatinine Clearance: 53.3 mL/min (by C-G formula based on SCr of 0.53 mg/dL). Recent Labs  Lab 01/11/22 0329 01/23/2022 1004 01/15/22 0412 01/16/22 0421  WBC 11.3* 27.4* 15.5* 21.0*    Liver Function Tests: Recent Labs  Lab 01/14/2022 1004 01/15/22 0412  AST 59* 21  ALT 215* 90*  ALKPHOS 103 63  BILITOT 0.5 0.3  PROT 6.9 5.3*  ALBUMIN 3.7 2.8*   No results for input(s): "LIPASE", "AMYLASE" in the last 168 hours. No results for input(s): "AMMONIA" in the last 168 hours.  ABG    Component Value Date/Time   PHART 7.38 12/25/2021 0943   PCO2ART 36 12/25/2021 0943   PO2ART 66 (L) 12/25/2021 0943   HCO3  25.4 01/06/2022 1201   ACIDBASEDEF 0.4 01/22/2022 1201   O2SAT 53.5 01/04/2022 1201     Coagulation Profile: No results for input(s): "INR", "PROTIME" in the last 168 hours.  Cardiac Enzymes: No results for input(s): "CKTOTAL", "CKMB", "CKMBINDEX", "TROPONINI" in the last 168 hours.  HbA1C: Hgb A1c MFr Bld  Date/Time Value Ref Range Status  12/08/2021 04:46 AM 6.3 (H) 4.8 -  5.6 % Final    Comment:    (NOTE) Pre diabetes:          5.7%-6.4%  Diabetes:              >6.4%  Glycemic control for   <7.0% adults with diabetes   10/21/2017 11:18 AM 7.5 (H) 4.8 - 5.6 % Final    Comment:    (NOTE) Pre diabetes:          5.7%-6.4% Diabetes:              >6.4% Glycemic control for   <7.0% adults with diabetes     CBG: Recent Labs  Lab 01/15/22 1553 01/15/22 2120 01/16/22 0802 01/16/22 1144 01/16/22 1534  GLUCAP 198* 289* 196* 231* 157*    PCCM service signing off, f/u prn   Christinia Gully, MD Pulmonary and [place] [phone]   After 7:00 pm call [place]  [phone]

## 2022-01-16 NOTE — Progress Notes (Signed)
Assumed care of patient, she is alert and oriented x4 watching television with son at bedside.

## 2022-01-16 NOTE — Progress Notes (Signed)
PROGRESS NOTE   Michaela Moon, is a 86 y.o. female, DOB - Mar 01, 1935, QPR:[id number]  Admit date - 01/23/2022   Admitting Physician Courage Denton Brick, MD  Outpatient Primary MD for the patient is [name], MD  LOS - 3  Chief Complaint  Patient presents with   Respiratory Distress      Brief Narrative:  86 y.o. female who is a reformed smoker (quit in 2004 )with medical history significant of COPD, diabetes, hypertension, lower extremity DVT (on chronic Eliquis), diagnosis of COVID in on 10/31/21 and again on 01/04/22 (after testing negative on 11/28/2021 and 12/07/2021) and recurrent hospitalization for aspiration pneumonia and acute on chronic respiratory failure---chronically on 5 L nasal cannula supplementation  -Discharged home on 01/12/2022 -Readmitted on 12/31/2021 with acute on chronic hypoxic respiratory failure As per pt's son she did okay in the evening of 01/12/2022-----this morning she became acutely short of breath, she was found to be very hypoxic, EMS was called and she was placed on BiPAP  Patient appears to have significant recurrent respiratory problems since she suffered COVID back in September 2023    -Assessment and Plan: 1)Acute on chronic respiratory failure with hypoxia -suspect recurrent aspiration episodes and COPD exacerbation -CT angio chest shows  Extensive bilateral heterogeneous and consolidative airspace Opacities -Weaned off continuous BiPAP on 01/14/2022, , may continue as needed BiPAP and BiPAP nightly -Recent MRSA PCR negative --Continue bronchodilators and mucolytics -c/n  IV Solu-Medrol, continue augmentin and fluconazole. -Patient appears to have significant recurrent respiratory problems since she had COVID back in September 2023 01/15/22 -Did well with BiPAP overnight -Goal pulse ox 87-92% wean oxygen as able     2)Dysphagia--chronic  -With a high risk for aspiration/ documented recurrent aspiration events -Continue dysphagia 2 diet  with nectar thick liquids. -Education provided for upright position during meals and after meals to continue minimizing chance of aspiration. -[name] reports having hospital bed for her at home. - she remains high risk for recurrent aspiration    3)H/o Bil DVT-lower extremity venous Dopplers from 12/08/2021 noted -CTA angio chest from 01/20/2022, 12/08/2021 and repeat CTA chest 12/25/2021 without acute PE -Continue Eliquis twice daily   4)Chronic Diastolic HF (Heart Failure) (HCC) -No vascular congestion appreciated on x-ray or CT scan -Continue home medication regimen. -Maintain adequate hydration. -Patient chronically on HCTZ. -Continue to follow urine output.   5)Insulin dependent type 2 diabetes mellitus with steroid induced hyperglycemia --Recent A1c 6.3-reflecting excellent diabetic control PTA -Anticipate steroid-induced hyperglycemia -continue  levemir Use Novolog/Humalog Sliding scale insulin with Accu-Cheks/Fingersticks as ordered    6) social/ethics--- advanced directives discussed with patient and son, patient request DNR/DNI status   7)Pressure injury of skin--POA -No signs of superimposed infection -Provide preventive measures and constant repositioning.   8)GERD (gastroesophageal reflux disease) -Continue PPI -Dose adjusted to twice a day while receiving steroids for further GI prophylaxis.   9)Mixed hyperlipidemia -Continue statin   10)-HTN-c/n  metoprolol 50 mg twice daily   11) generalized weakness and deconditioning--- physical therapy eval appreciated recommends SNF rehab   Disposition: Anticipate that discharge to SNF rehab    Status is: Inpatient    Disposition: The patient is from: Home              Anticipated d/c is to:  SNF              Anticipated d/c date is: 2 days              Patient currently is not  medically stable to d/c. Barriers: Not Clinically Stable-   Code Status :  -  Code Status: DNR   Family Communication:   Discussed  with Son  DVT Prophylaxis  :   - SCDs  SCDs Start: 01/04/2022 1311 Place TED hose Start: 12/30/2021 1311 apixaban (ELIQUIS) tablet 5 mg   Lab Results  Component Value Date   PLT 178 01/16/2022   Inpatient Medications  Scheduled Meds:  amoxicillin-clavulanate  1 tablet Oral Q12H   apixaban  5 mg Oral BID   arformoterol  15 mcg Nebulization BID   atorvastatin  80 mg Oral Daily   budesonide  0.25 mg Nebulization BID   dextromethorphan-guaiFENesin  1 tablet Oral BID   ferrous sulfate  325 mg Oral Q breakfast   fluconazole  200 mg Oral Daily   gabapentin  100 mg Oral TID   insulin aspart  0-20 Units Subcutaneous TID WC   insulin aspart  0-5 Units Subcutaneous QHS   insulin aspart  10 Units Subcutaneous TID WC   insulin glargine-yfgn  30 Units Subcutaneous Daily   methylPREDNISolone (SOLU-MEDROL) injection  40 mg Intravenous Q12H   metoprolol tartrate  50 mg Oral BID   mouth rinse  15 mL Mouth Rinse 4 times per day   pantoprazole  40 mg Oral Daily   polyethylene glycol  17 g Oral Daily   polyvinyl alcohol  2 drop Both Eyes QID   revefenacin  175 mcg Nebulization Daily   senna-docusate  2 tablet Oral QHS   sodium chloride flush  3 mL Intravenous Q12H   sodium chloride flush  3 mL Intravenous Q12H   Continuous Infusions:  sodium chloride     sodium chloride Stopped (01/16/22 0950)   PRN Meds:.sodium chloride, acetaminophen **OR** acetaminophen, albuterol, albuterol, bisacodyl, hydrALAZINE, ondansetron **OR** ondansetron (ZOFRAN) IV, ondansetron, polyethylene glycol, sodium chloride flush, traZODone   Anti-infectives (From admission, onward)    Start     Dose/Rate Route Frequency Ordered Stop   01/01/2022 1315  amoxicillin-clavulanate (AUGMENTIN) 875-125 MG per tablet 1 tablet        1 tablet Oral Every 12 hours 01/01/2022 1302 01/20/22 0959   01/17/2022 1315  fluconazole (DIFLUCAN) tablet 200 mg        200 mg Oral Daily 01/06/2022 1312 01/18/22 0959       Subjective: Michaela Moon  reports chronic ongoing SOB and can hear coarse rales in chest   Objective: Vitals:   01/16/22 0930 01/16/22 0936 01/16/22 0939 01/16/22 1315  BP: (!) 170/71   133/61  Pulse: (!) 104   72  Resp:    (!) 22  Temp: 98 F (36.7 C)   (!) 97.4 F (36.3 C)  TempSrc: Oral   Oral  SpO2: (!) 86% (!) 88% (!) 89% 96%  Weight:      Height:        Intake/Output Summary (Last 24 hours) at 01/16/2022 1616 Last data filed at 01/15/2022 2000 Gross per 24 hour  Intake 237.4 ml  Output --  Net 237.4 ml   Filed Weights   01/07/2022 0949 01/24/2022 1339  Weight: 74.5 kg 75.7 kg   Physical Exam Gen:- Awake Alert,  chronically ill appearing.   HEENT:- [place].AT, No sclera icterus, Nose-- [place] 7 L/min (able to come off BiPAP) Neck-Supple Neck,No JVD,.  Lungs-coarse rales heard diffusely.  CV- S1, S2 normal, regular , tachycardic Abd-  +ve B.Sounds, Abd Soft, No tenderness,    Extremity/Skin:- No significant edema, pedal pulses present  Psych-affect is appropriate, oriented x3 Neuro-generalized weakness, no new focal deficits, no tremors   Data Reviewed: I have personally reviewed following labs and imaging studies  CBC: Recent Labs  Lab 01/11/22 0329 01/18/2022 1004 01/15/22 0412 01/16/22 0421  WBC 11.3* 27.4* 15.5* 21.0*  NEUTROABS  --  20.8*  --   --   HGB 10.1* 11.9* 9.2* 10.0*  HCT 32.0* 38.2 29.6* 32.0*  MCV 87.4 88.8 88.6 88.2  PLT 199 269 177 510   Basic Metabolic Panel: Recent Labs  Lab 01/11/22 0329 01/04/2022 1004 01/15/22 0412 01/16/22 0421  NA 135 134* 137 137  K 4.8 4.2 4.3 4.2  CL 99 98 107 108  CO2 '27 24 28 26  '$ GLUCOSE 238* 354* 159* 135*  BUN 25* 32* 19 20  CREATININE 0.61 0.94 0.49 0.53  CALCIUM 10.5* 11.1* 10.4* 10.4*   GFR: Estimated Creatinine Clearance: 53.3 mL/min (by C-G formula based on SCr of 0.53 mg/dL). Liver Function Tests: Recent Labs  Lab 12/26/2021 1004 01/15/22 0412  AST 59* 21  ALT 215* 90*  ALKPHOS 103 63  BILITOT 0.5 0.3  PROT 6.9 5.3*   ALBUMIN 3.7 2.8*   Radiology Studies: DG Chest Port 1 View  Result Date: 01/16/2022 CLINICAL DATA:  Acute respiratory failure.  Shortness of breath. EXAM: PORTABLE CHEST 1 VIEW COMPARISON:  Chest radiograph 01/14/2022 FINDINGS: The cardiomediastinal silhouette is within normal limits. Aortic atherosclerosis is noted. Patchy airspace opacities are again seen in the mid and lower lungs bilaterally with mildly asymmetric consolidation laterally in the right lung base which may have mildly worsened. No large pleural effusion or pneumothorax is identified. A remote proximal left humerus fracture is noted. IMPRESSION: Stable to slight worsening of bilateral airspace disease suggestive of pneumonia. Electronically Signed   By: Logan Bores M.D.   On: 01/16/2022 13:13    Scheduled Meds:  amoxicillin-clavulanate  1 tablet Oral Q12H   apixaban  5 mg Oral BID   arformoterol  15 mcg Nebulization BID   atorvastatin  80 mg Oral Daily   budesonide  0.25 mg Nebulization BID   dextromethorphan-guaiFENesin  1 tablet Oral BID   ferrous sulfate  325 mg Oral Q breakfast   fluconazole  200 mg Oral Daily   gabapentin  100 mg Oral TID   insulin aspart  0-20 Units Subcutaneous TID WC   insulin aspart  0-5 Units Subcutaneous QHS   insulin aspart  10 Units Subcutaneous TID WC   insulin glargine-yfgn  30 Units Subcutaneous Daily   methylPREDNISolone (SOLU-MEDROL) injection  40 mg Intravenous Q12H   metoprolol tartrate  50 mg Oral BID   mouth rinse  15 mL Mouth Rinse 4 times per day   pantoprazole  40 mg Oral Daily   polyethylene glycol  17 g Oral Daily   polyvinyl alcohol  2 drop Both Eyes QID   revefenacin  175 mcg Nebulization Daily   senna-docusate  2 tablet Oral QHS   sodium chloride flush  3 mL Intravenous Q12H   sodium chloride flush  3 mL Intravenous Q12H   Continuous Infusions:  sodium chloride     sodium chloride Stopped (01/16/22 0950)     LOS: 3 days   Irwin Brakeman M.D on 01/16/2022 at  4:16 PM  Go to [email or web address] - for contact info  [facility] - Office  [phone]  If 7PM-7AM, please contact night-coverage [email or web address] 01/16/2022, 4:16 PM

## 2022-01-16 NOTE — TOC Progression Note (Addendum)
Transition of Care [facility]) - Progression Note    Patient Details  Name: Michaela Moon MRN: [id number] Date of Birth: 1936/02/19  [facility] CM/SW Contact  Salome Arnt, [place] Phone Number: 01/16/2022, 12:57 PM  Clinical Narrative: LCSW provided bed offers to pt's son who plans to discuss with pt and family. Will notify TOC when decision made. Pt now requiring 15L O2. Will start authorization when appropriate.       Expected Discharge Plan: [place] Barriers to Discharge: Continued Medical Work up  Expected Discharge Plan and Services Expected Discharge Plan: [place] In-house Referral: Clinical Social Work   Post Acute Care Choice: [place] Living arrangements for the past 2 months: [place] Expected Discharge Date: 02-16-2022                         HH Arranged: PT, RN [place] Agency: [facility] Date [place] [facility] Agency Contacted: 01/14/22 Time [place]: [phone] Representative spoke with at [place]: [place] Determinants of Health ([place]) Interventions    Readmission Risk Interventions    01/14/2022    7:53 AM 01/07/2022   11:55 AM 12/31/2021    3:22 PM  Readmission Risk Prevention Plan  Transportation Screening Complete Complete Complete  PCP or Specialist Appt within 5-7 Days   Complete  Home Care Screening   Complete  Medication Review (RN CM)   Complete  HRI or [place]  Complete   Social Work Consult for [place] Planning/Counseling  Complete   Palliative Care Screening  Complete   Medication Review Press photographer) Complete Complete   HRI or [place] Complete    SW Recovery Care/Counseling Consult Complete    [place] Not Applicable

## 2022-01-16 NOTE — Progress Notes (Signed)
Palliative: Michaela Moon is sitting up in bed.  She greets me, making and mostly keeping eye contact.  She appears acutely/chronically ill and frail, obese.  She is alert and oriented, able to make her needs known.  Her son/HCPOA, Michaela Moon, is present at bedside.  Michaela Moon is becoming comfortable with the use of BiPAP at night.  Her respiratory needs have increased from yesterday 5 L nasal cannula to 15 L high flow.  We talk about her respiratory issues and the treatment plan.  We talk about recurrent aspiration.  We talk about her acute and chronic health concerns.  We talk about short-term rehab.  Patient and son asked appropriate questions which were answered as possible, but reassured that transition of care team will continue to assist them.  I shared that short-term rehab will not change her chronic health conditions, in particular her aspiration, but will provide her with 24/7 nursing care.  Both patient and son continue to endorse understanding that there is no change in her chronic aspiration.  Patient and family are agreeable to short-term rehab as she is able.  Conference with attending, pulmonology, bedside nursing staff, transition of care team related to patient condition, needs, goals of care, disposition.  Plan: At this point continue to treat the treatable but no CPR or intubation.  BiPAP as needed.  Time for outcomes.  Short-term rehab as she is able.  48 minutes Quinn Axe, NP Palliative medicine team Team phone [phone] Greater than 50% of this time was spent counseling and coordinating care related to the above assessment and plan.

## 2022-01-16 NOTE — Hospital Course (Signed)
  86 y.o. female who is a reformed smoker (quit in 2004 )with medical history significant of COPD, diabetes, hypertension, lower extremity DVT (on chronic Eliquis), diagnosis of COVID in on 10/31/21 and again on 01/04/22 (after testing negative on 11/28/2021 and 12/07/2021) and recurrent hospitalization for aspiration pneumonia and acute on chronic respiratory failure---chronically on 5 L nasal cannula supplementation  -Discharged home on 01/12/2022 -Readmitted on 01/20/2022 with acute on chronic hypoxic respiratory failure As per pt's son she did okay in the evening of 01/12/2022-----this morning she became acutely short of breath, she was found to be very hypoxic, EMS was called and she was placed on BiPAP - Patient appears to have significant recurrent respiratory problems since she suffered COVID back in September 1610 also complicated by recurrent aspiration events.

## 2022-01-17 DIAGNOSIS — J9622 Acute and chronic respiratory failure with hypercapnia: Secondary | ICD-10-CM | POA: Diagnosis not present

## 2022-01-17 DIAGNOSIS — R1314 Dysphagia, pharyngoesophageal phase: Secondary | ICD-10-CM | POA: Diagnosis not present

## 2022-01-17 DIAGNOSIS — Z515 Encounter for palliative care: Secondary | ICD-10-CM | POA: Diagnosis not present

## 2022-01-17 DIAGNOSIS — Z7189 Other specified counseling: Secondary | ICD-10-CM | POA: Diagnosis not present

## 2022-01-17 DIAGNOSIS — Z794 Long term (current) use of insulin: Secondary | ICD-10-CM

## 2022-01-17 DIAGNOSIS — J9621 Acute and chronic respiratory failure with hypoxia: Secondary | ICD-10-CM | POA: Diagnosis not present

## 2022-01-17 DIAGNOSIS — E119 Type 2 diabetes mellitus without complications: Secondary | ICD-10-CM | POA: Diagnosis not present

## 2022-01-17 LAB — GLUCOSE, CAPILLARY
Glucose-Capillary: 142 mg/dL — ABNORMAL HIGH (ref 70–99)
Glucose-Capillary: 235 mg/dL — ABNORMAL HIGH (ref 70–99)
Glucose-Capillary: 371 mg/dL — ABNORMAL HIGH (ref 70–99)
Glucose-Capillary: 280 mg/dL — ABNORMAL HIGH (ref 70–99)
Glucose-Capillary: 116 mg/dL — ABNORMAL HIGH (ref 70–99)

## 2022-01-17 MED ORDER — METHYLPREDNISOLONE SODIUM SUCC 40 MG IJ SOLR
40.0000 mg | INTRAMUSCULAR | Status: DC
  Administered 2022-01-18: 40 mg via INTRAVENOUS
  Filled 2022-01-17: qty 1

## 2022-01-17 MED ORDER — INSULIN GLARGINE-YFGN 100 UNIT/ML ~~LOC~~ SOLN
36.0000 [IU] | Freq: Every day | SUBCUTANEOUS | Status: DC
  Administered 2022-01-18: 36 [IU] via SUBCUTANEOUS
  Filled 2022-01-17 (×2): qty 0.36

## 2022-01-17 MED ORDER — FUROSEMIDE 10 MG/ML IJ SOLN
30.0000 mg | Freq: Every day | INTRAMUSCULAR | Status: DC
  Administered 2022-01-17 – 2022-01-18 (×2): 30 mg via INTRAVENOUS
  Filled 2022-01-17 (×2): qty 4

## 2022-01-17 NOTE — Progress Notes (Signed)
BiPAP is ordered and available in room to use as needed; currently not in use. Patient remained on 15L throughout night and tolerated it well. SpO2 remained 91-92%, I didn't try to wean. No reports of pain or new SOB  Seward Meth, RN

## 2022-01-17 NOTE — Progress Notes (Signed)
Palliative: Michaela Moon in her room with nursing staff attending to needs.  She is alert and oriented, able to make her needs known.  Her son/HCPOA, [name], is present at bedside.  At this point Mrs. Dungee is on 15 L high flow nasal cannula.  The goal is to wean her oxygen to a saturation 88 to 92%.  Pulmonologist is following closely.  Unfortunately, Mrs. Shor chronic illness burden is limiting her ability to participate in rehab, her care, and ultimately to recover.  Conference with attending, bedside nursing staff, transition of care team related to patient condition, needs, goals of care, disposition.  Plan: At this point continue to treat the treatable but no CPR or intubation.  Agreeable to the use of BiPAP.  Short-term rehab if able, limited by oxygen needs. Orders adjusted for oxygen parameters.  25 minutes Quinn Axe, NP Palliative medicine team Team phone [phone] Greater than 50% of this time was spent counseling and coordinating care related to the above assessment and plan.

## 2022-01-17 NOTE — Progress Notes (Signed)
PROGRESS NOTE   Michaela Moon, is a 86 y.o. female, DOB - 03-12-1935, HEN:[id number]  Admit date - 01/12/2022   Admitting Physician Courage Denton Brick, MD  Outpatient Primary MD for the patient is [name], MD  LOS - 4  Chief Complaint  Patient presents with   Respiratory Distress      Brief Narrative:  86 y.o. female who is a reformed smoker (quit in 2004 )with medical history significant of COPD, diabetes, hypertension, lower extremity DVT (on chronic Eliquis), diagnosis of COVID in on 10/31/21 and again on 01/04/22 (after testing negative on 11/28/2021 and 12/07/2021) and recurrent hospitalization for aspiration pneumonia and acute on chronic respiratory failure---chronically on 5 L nasal cannula supplementation  -Discharged home on 01/12/2022 -Readmitted on 12/27/2021 with acute on chronic hypoxic respiratory failure As per pt's son she did okay in the evening of 01/12/2022-----this morning she became acutely short of breath, she was found to be very hypoxic, EMS was called and she was placed on BiPAP  Patient appears to have significant recurrent respiratory problems since she suffered COVID back in September 2023    -Assessment and Plan: 1)Acute on chronic respiratory failure with hypoxia -suspect recurrent aspiration episodes and COPD exacerbation -CT angio chest shows  Extensive bilateral heterogeneous and consolidative airspace Opacities -Weaned off continuous BiPAP on 01/14/2022, , may continue as needed BiPAP and BiPAP nightly -Recent MRSA PCR negative --Continue bronchodilators and mucolytics -c/n  IV Solu-Medrol, continue augmentin and fluconazole. -Patient appears to have significant recurrent respiratory problems since she had COVID back in September 2023 with a progressive decline  01/15/22 -Pt would accept bipap but is now DNR/DNI.   -Goal pulse ox 87-92% wean oxygen as able    2)Dysphagia--chronic  -With a high risk for aspiration/ documented recurrent  aspiration events -Continue dysphagia 2 diet with nectar thick liquids. -Education provided for upright position during meals and after meals to continue minimizing chance of aspiration. -[name] reports having hospital bed for her at home. - she remains high risk for recurrent aspiration    3)H/o Bil DVT-lower extremity venous Dopplers from 12/08/2021 noted -CTA angio chest from 12/29/2021, 12/08/2021 and repeat CTA chest 12/25/2021 without acute PE -Continue Eliquis twice daily   4)Chronic Diastolic HF (Heart Failure) -No vascular congestion appreciated on x-ray or CT scan -Patient chronically on HCTZ from home meds -added IV lasix 30 mg daily x 3 doses and reassess need for more -follow electrolytes.   5)Uncontrolled type 2 diabetes mellitus with steroid induced hyperglycemia --Recent A1c 6.3-reflecting excellent diabetic control PTA -Anticipate steroid-induced hyperglycemia -continue  levemir Use Novolog/Humalog Sliding scale insulin with Accu-Cheks/Fingersticks as ordered   CBG (last 3)  Recent Labs    01/17/22 0430 01/17/22 0753 01/17/22 1140  GLUCAP 142* 235* 371*   6) social/ethics--- advanced directives discussed with patient and son, patient request DNR/DNI status   7)Pressure injury of skin--POA -No signs of superimposed infection -Provide preventive measures and constant repositioning.   8)GERD (gastroesophageal reflux disease) -Continue PPI -Dose adjusted to twice a day while receiving steroids for further GI prophylaxis.   9)Mixed hyperlipidemia -Continue statin   10)-HTN-c/n  metoprolol 50 mg twice daily   11) generalized weakness and deconditioning--- physical therapy eval appreciated recommends SNF rehab   Disposition: Anticipate that discharge to SNF rehab when able to wean oxygen to about 6L   Status is: Inpatient    Disposition: The patient is from: Home  Anticipated d/c is to:  SNF              Anticipated d/c date is: 3  days              Patient currently is not medically stable to d/c. Barriers: Not Clinically Stable-   Code Status :  -  Code Status: DNR   Family Communication:   Discussed with Son  DVT Prophylaxis  :   - SCDs  SCDs Start: 01/12/2022 1311 Place TED hose Start: 12/30/2021 1311 apixaban (ELIQUIS) tablet 5 mg   Lab Results  Component Value Date   PLT 178 01/16/2022   Inpatient Medications  Scheduled Meds:  amoxicillin-clavulanate  1 tablet Oral Q12H   apixaban  5 mg Oral BID   arformoterol  15 mcg Nebulization BID   atorvastatin  80 mg Oral Daily   budesonide  0.25 mg Nebulization BID   dextromethorphan-guaiFENesin  1 tablet Oral BID   ferrous sulfate  325 mg Oral Q breakfast   fluconazole  200 mg Oral Daily   furosemide  30 mg Intravenous Daily   gabapentin  100 mg Oral TID   insulin aspart  0-20 Units Subcutaneous TID WC   insulin aspart  0-5 Units Subcutaneous QHS   insulin aspart  10 Units Subcutaneous TID WC   insulin glargine-yfgn  30 Units Subcutaneous Daily   methylPREDNISolone (SOLU-MEDROL) injection  40 mg Intravenous Q12H   metoprolol tartrate  50 mg Oral BID   mouth rinse  15 mL Mouth Rinse 4 times per day   pantoprazole  40 mg Oral Daily   polyethylene glycol  17 g Oral Daily   polyvinyl alcohol  2 drop Both Eyes QID   revefenacin  175 mcg Nebulization Daily   senna-docusate  2 tablet Oral QHS   sodium chloride flush  3 mL Intravenous Q12H   sodium chloride flush  3 mL Intravenous Q12H   Continuous Infusions:  sodium chloride     sodium chloride Stopped (01/16/22 0950)   PRN Meds:.sodium chloride, acetaminophen **OR** acetaminophen, albuterol, albuterol, bisacodyl, hydrALAZINE, ondansetron **OR** ondansetron (ZOFRAN) IV, ondansetron, polyethylene glycol, sodium chloride flush, traZODone   Anti-infectives (From admission, onward)    Start     Dose/Rate Route Frequency Ordered Stop   01/24/2022 1315  amoxicillin-clavulanate (AUGMENTIN) 875-125 MG per tablet 1  tablet        1 tablet Oral Every 12 hours 01/04/2022 1302 01/20/22 0959   12/27/2021 1315  fluconazole (DIFLUCAN) tablet 200 mg        200 mg Oral Daily 01/20/2022 1312 01/18/22 0959       Subjective: [name] reports breathing is unchanged today.  Still on 15L [place]   Objective: Vitals:   01/17/22 0436 01/17/22 0803 01/17/22 0809 01/17/22 0812  BP: (!) 174/75     Pulse: 63     Resp: 20     Temp: 98.1 F (36.7 C)     TempSrc: Oral     SpO2: 91% 90% 92% 91%  Weight:      Height:       No intake or output data in the 24 hours ending 01/17/22 1211  Filed Weights   01/23/2022 0949 01/07/2022 1339  Weight: 74.5 kg 75.7 kg   Physical Exam Physical Exam Vitals and nursing note reviewed.  Constitutional:      General: She is not in acute distress.    Appearance: She is obese. She is ill-appearing. She is not toxic-appearing.  HENT:  Head: Normocephalic and atraumatic.     Nose: Nose normal.     Mouth/Throat:     Mouth: Mucous membranes are moist.  Eyes:     Extraocular Movements: Extraocular movements intact.     Pupils: Pupils are equal, round, and reactive to light.  Cardiovascular:     Rate and Rhythm: Normal rate.     Pulses: Normal pulses.  Pulmonary:     Effort: No respiratory distress.     Breath sounds: Rhonchi and rales present.  Abdominal:     Palpations: Abdomen is soft.     Tenderness: There is no abdominal tenderness. There is no guarding.  Musculoskeletal:     Cervical back: Neck supple.  Skin:    General: Skin is warm.  Neurological:     General: No focal deficit present.     Mental Status: She is alert and oriented to person, place, and time. Mental status is at baseline.  Psychiatric:        Mood and Affect: Mood normal.        Behavior: Behavior normal.    Data Reviewed: I have personally reviewed following labs and imaging studies  CBC: Recent Labs  Lab 01/11/22 0329 01/02/2022 1004 01/15/22 0412 01/16/22 0421  WBC 11.3* 27.4* 15.5* 21.0*   NEUTROABS  --  20.8*  --   --   HGB 10.1* 11.9* 9.2* 10.0*  HCT 32.0* 38.2 29.6* 32.0*  MCV 87.4 88.8 88.6 88.2  PLT 199 269 177 295   Basic Metabolic Panel: Recent Labs  Lab 01/11/22 0329 01/22/2022 1004 01/15/22 0412 01/16/22 0421  NA 135 134* 137 137  K 4.8 4.2 4.3 4.2  CL 99 98 107 108  CO2 '27 24 28 26  '$ GLUCOSE 238* 354* 159* 135*  BUN 25* 32* 19 20  CREATININE 0.61 0.94 0.49 0.53  CALCIUM 10.5* 11.1* 10.4* 10.4*   GFR: Estimated Creatinine Clearance: 53.3 mL/min (by C-G formula based on SCr of 0.53 mg/dL). Liver Function Tests: Recent Labs  Lab 12/27/2021 1004 01/15/22 0412  AST 59* 21  ALT 215* 90*  ALKPHOS 103 63  BILITOT 0.5 0.3  PROT 6.9 5.3*  ALBUMIN 3.7 2.8*   Radiology Studies: DG Chest Port 1 View  Result Date: 01/16/2022 CLINICAL DATA:  Acute respiratory failure.  Shortness of breath. EXAM: PORTABLE CHEST 1 VIEW COMPARISON:  Chest radiograph 12/31/2021 FINDINGS: The cardiomediastinal silhouette is within normal limits. Aortic atherosclerosis is noted. Patchy airspace opacities are again seen in the mid and lower lungs bilaterally with mildly asymmetric consolidation laterally in the right lung base which may have mildly worsened. No large pleural effusion or pneumothorax is identified. A remote proximal left humerus fracture is noted. IMPRESSION: Stable to slight worsening of bilateral airspace disease suggestive of pneumonia. Electronically Signed   By: Logan Bores M.D.   On: 01/16/2022 13:13    Scheduled Meds:  amoxicillin-clavulanate  1 tablet Oral Q12H   apixaban  5 mg Oral BID   arformoterol  15 mcg Nebulization BID   atorvastatin  80 mg Oral Daily   budesonide  0.25 mg Nebulization BID   dextromethorphan-guaiFENesin  1 tablet Oral BID   ferrous sulfate  325 mg Oral Q breakfast   fluconazole  200 mg Oral Daily   furosemide  30 mg Intravenous Daily   gabapentin  100 mg Oral TID   insulin aspart  0-20 Units Subcutaneous TID WC   insulin aspart   0-5 Units Subcutaneous QHS   insulin aspart  10 Units Subcutaneous TID WC   insulin glargine-yfgn  30 Units Subcutaneous Daily   methylPREDNISolone (SOLU-MEDROL) injection  40 mg Intravenous Q12H   metoprolol tartrate  50 mg Oral BID   mouth rinse  15 mL Mouth Rinse 4 times per day   pantoprazole  40 mg Oral Daily   polyethylene glycol  17 g Oral Daily   polyvinyl alcohol  2 drop Both Eyes QID   revefenacin  175 mcg Nebulization Daily   senna-docusate  2 tablet Oral QHS   sodium chloride flush  3 mL Intravenous Q12H   sodium chloride flush  3 mL Intravenous Q12H   Continuous Infusions:  sodium chloride     sodium chloride Stopped (01/16/22 0950)     LOS: 4 days   Irwin Brakeman M.D on 01/17/2022 at 12:11 PM  Go to [email or web address] - for contact info  [facility] - Office  [phone]  If 7PM-7AM, please contact night-coverage [email or web address] 01/17/2022, 12:11 PM

## 2022-01-17 NOTE — Progress Notes (Signed)
Spoke with patient's RN about Bipap order.  Patient's O2 requirements increased today, Patient was placed on Dreamstation last night, but not able to run O2 requirement now through Dreamstation; patient would have to be placed on V60.  Order is PRN, but hopefully, patient can use when O2 is weaned back down.

## 2022-01-18 ENCOUNTER — Inpatient Hospital Stay (HOSPITAL_COMMUNITY): Payer: Medicare Other

## 2022-01-18 DIAGNOSIS — J9622 Acute and chronic respiratory failure with hypercapnia: Secondary | ICD-10-CM | POA: Diagnosis not present

## 2022-01-18 DIAGNOSIS — R1314 Dysphagia, pharyngoesophageal phase: Secondary | ICD-10-CM | POA: Diagnosis not present

## 2022-01-18 DIAGNOSIS — J9621 Acute and chronic respiratory failure with hypoxia: Secondary | ICD-10-CM | POA: Diagnosis not present

## 2022-01-18 DIAGNOSIS — K219 Gastro-esophageal reflux disease without esophagitis: Secondary | ICD-10-CM | POA: Diagnosis not present

## 2022-01-18 LAB — GLUCOSE, CAPILLARY
Glucose-Capillary: 103 mg/dL — ABNORMAL HIGH (ref 70–99)
Glucose-Capillary: 141 mg/dL — ABNORMAL HIGH (ref 70–99)
Glucose-Capillary: 137 mg/dL — ABNORMAL HIGH (ref 70–99)

## 2022-01-18 LAB — BASIC METABOLIC PANEL
Anion gap: 6 (ref 5–15)
BUN: 24 mg/dL — ABNORMAL HIGH (ref 8–23)
CO2: 30 mmol/L (ref 22–32)
Calcium: 10.5 mg/dL — ABNORMAL HIGH (ref 8.9–10.3)
Chloride: 103 mmol/L (ref 98–111)
Creatinine, Ser: 0.6 mg/dL (ref 0.44–1.00)
GFR, Estimated: >60 mL/min (ref >60)
Glucose, Bld: 82 mg/dL (ref 70–99)
Potassium: 3.7 mmol/L (ref 3.5–5.1)
Sodium: 139 mmol/L (ref 135–145)

## 2022-01-18 LAB — MAGNESIUM: Magnesium: 1.7 mg/dL (ref 1.7–2.4)

## 2022-01-18 MED ORDER — GLYCOPYRROLATE 1 MG PO TABS: 1.0000 mg | ORAL_TABLET | ORAL | Status: DC | PRN

## 2022-01-18 MED ORDER — DIPHENHYDRAMINE HCL 50 MG/ML IJ SOLN: 12.5000 mg | INTRAMUSCULAR | Status: DC | PRN

## 2022-01-18 MED ORDER — BIOTENE DRY MOUTH MT LIQD: 15.0000 mL | OROMUCOSAL | Status: DC | PRN

## 2022-01-18 MED ORDER — LORAZEPAM 2 MG/ML IJ SOLN: 1.0000 mg | INTRAMUSCULAR | Status: DC | PRN

## 2022-01-18 MED ORDER — MORPHINE BOLUS VIA INFUSION: 2.0000 mg | INTRAVENOUS | Status: DC | PRN

## 2022-01-18 MED ORDER — MAGNESIUM SULFATE 2 GM/50ML IV SOLN
2.0000 g | Freq: Once | INTRAVENOUS | Status: AC
  Administered 2022-01-18: 2 g via INTRAVENOUS
  Filled 2022-01-18: qty 50

## 2022-01-18 MED ORDER — LORAZEPAM 1 MG PO TABS: 1.0000 mg | ORAL_TABLET | ORAL | Status: DC | PRN

## 2022-01-18 MED ORDER — MORPHINE 100MG IN NS 100ML (1MG/ML) PREMIX INFUSION
2.0000 mg/h | INTRAVENOUS | Status: DC
  Administered 2022-01-18: 2 mg/h via INTRAVENOUS
  Filled 2022-01-18: qty 100

## 2022-01-18 MED ORDER — POTASSIUM CHLORIDE 20 MEQ PO PACK
20.0000 meq | PACK | Freq: Every day | ORAL | Status: DC
  Administered 2022-01-18: 20 meq via ORAL
  Filled 2022-01-18: qty 1

## 2022-01-18 MED ORDER — GLYCOPYRROLATE 0.2 MG/ML IJ SOLN
0.2000 mg | INTRAMUSCULAR | Status: DC | PRN
  Administered 2022-01-18 – 2022-01-19 (×2): 0.2 mg via INTRAVENOUS
  Filled 2022-01-18 (×2): qty 1

## 2022-01-18 MED ORDER — GLYCOPYRROLATE 0.2 MG/ML IJ SOLN: 0.2000 mg | INTRAMUSCULAR | Status: DC | PRN

## 2022-01-18 NOTE — Progress Notes (Signed)
PROGRESS NOTE   Michaela Moon, is a 86 y.o. female, DOB - [birth year], DPO:[id number]  Admit date - 01/05/2022   Admitting Physician Michaela Denton Brick, MD  Outpatient Primary MD for the patient is Michaela Burly, MD  LOS - 5  Chief Complaint  Patient presents with   Respiratory Distress      Brief Narrative:  86 y.o. female who is a reformed smoker (quit in 2004 )with medical history significant of COPD, diabetes, hypertension, lower extremity DVT (on chronic Eliquis), diagnosis of COVID in on 10/31/21 and again on 01/04/22 (after testing negative on 11/28/2021 and 12/07/2021) and recurrent hospitalization for aspiration pneumonia and acute on chronic respiratory failure---chronically on 5 L nasal cannula supplementation  -Discharged home on 01/12/2022 -Readmitted on 01/16/2022 with acute on chronic hypoxic respiratory failure As per pt's son she did okay in the evening of 01/12/2022-----this morning she became acutely short of breath, she was found to be very hypoxic, EMS was called and she was placed on BiPAP  Patient appears to have significant recurrent respiratory problems since she suffered COVID back in September 2023    -Assessment and Plan: 1)Acute on chronic respiratory failure with hypoxia -suspect recurrent aspiration episodes and COPD exacerbation -CT angio chest shows  Extensive bilateral heterogeneous and consolidative airspace Opacities -Weaned off continuous BiPAP on 01/14/2022, , may continue as needed BiPAP and BiPAP nightly -Recent MRSA PCR negative --Continue bronchodilators and mucolytics -c/n  IV Solu-Medrol, continue augmentin and fluconazole. -Patient appears to have significant recurrent respiratory problems since she had COVID back in September 2023 with a progressive decline  01/15/22 -Pt would accept bipap but is now DNR/DNI.   -Goal pulse ox 87-92% wean oxygen as able  01/18/22 - Pt had acute decline on medsurg unit/tachycardic, pulse ox desaturated into  60s; placed on bipap and transferred to SDU - after being on bipap most of day no significant improvement and increasing distress noted; I had a goals of care meeting with sisters, son at bedside and decision made to transition to full comfort measures; focusing on comfort, reducing to eliminating respiratory distress and unburdening patient from bipap.    2)Dysphagia--chronic  -With a high risk for aspiration/ documented recurrent aspiration events -Continue dysphagia 2 diet with nectar thick liquids. -Education provided for upright position during meals and after meals to continue minimizing chance of aspiration. -[name] reports having hospital bed for her at home. - she remains high risk for recurrent aspiration  - transitioned to full comfort measures on 11/24    3)H/o Bil DVT-lower extremity venous Dopplers from 12/08/2021 noted -CTA angio chest from 01/14/2022, 12/08/2021 and repeat CTA chest 12/25/2021 without acute PE - transitioning to full comfort measures 11/24   4)Chronic Diastolic HF (Heart Failure) -No vascular congestion appreciated on x-ray or CT scan -Patient chronically on HCTZ from home meds -- transitioning to full comfort measures 11/24   5)Uncontrolled type 2 diabetes mellitus with steroid induced hyperglycemia --Recent A1c 6.3-reflecting excellent diabetic control PTA -- transitioning to full comfort measures 11/24  CBG (last 3)  Recent Labs    01/18/22 0307 01/18/22 0726 01/18/22 1126  GLUCAP 103* 141* 137*    6) social/ethics--- advanced directives discussed with patient and son, patient request DNR/DNI status  - transitioning to full comfort measures 11/24   7)Pressure injury of skin--POA -No signs of superimposed infection -Provide preventive measures and constant repositioning.   8)GERD (gastroesophageal reflux disease) -- transitioning to full comfort measures 11/24   9)Mixed hyperlipidemia -- transitioning to  full comfort measures 11/24    10)-HTN-- transitioning to full comfort measures 11/24   11) generalized weakness and deconditioning--- - transitioning to full comfort measures 11/24   Disposition: Anticipate hospital death    Status is: Inpatient    Disposition: anticipating hospital death  Barriers: Not Clinically Stable-   Code Status :  -  Code Status: DNR   Family Communication:   Discussed with Son  DVT Prophylaxis  :   - SCDs     Lab Results  Component Value Date   PLT 178 01/16/2022   Inpatient Medications  Scheduled Meds:  mouth rinse  15 mL Mouth Rinse 4 times per day   polyvinyl alcohol  2 drop Both Eyes QID   Continuous Infusions:  sodium chloride Stopped (01/16/22 0950)   morphine     PRN Meds:.acetaminophen **OR** acetaminophen, antiseptic oral rinse, diphenhydrAMINE, glycopyrrolate **OR** glycopyrrolate **OR** glycopyrrolate, LORazepam **OR** LORazepam **OR** LORazepam, morphine, ondansetron **OR** ondansetron (ZOFRAN) IV, ondansetron   Anti-infectives (From admission, onward)    Start     Dose/Rate Route Frequency Ordered Stop   01/16/2022 1315  amoxicillin-clavulanate (AUGMENTIN) 875-125 MG per tablet 1 tablet  Status:  Discontinued        1 tablet Oral Every 12 hours 01/17/2022 1302 01/18/22 1446   12/31/2021 1315  fluconazole (DIFLUCAN) tablet 200 mg        200 mg Oral Daily 01/02/2022 1312 01/18/22 0959       Subjective: Michaela Moon developed acute respiratory failure on med surg - - transferred to stepdown unit and now transitioned to full comfort measures   Objective: Vitals:   01/18/22 1200 01/18/22 1300 01/18/22 1318 01/18/22 1400  BP: (!) 98/51 (!) 86/47  (!) 82/51  Pulse: 80 88 88 86  Resp: '11 18 16 11  '$ Temp:      TempSrc:      SpO2: 90% (!) 88% 90% 90%  Weight:      Height:        Intake/Output Summary (Last 24 hours) at 01/18/2022 1508 Last data filed at 01/18/2022 1000 Gross per 24 hour  Intake 50 ml  Output 1050 ml  Net -1000 ml    Filed Weights    01/17/2022 0949 01/01/2022 1339  Weight: 74.5 kg 75.7 kg   Physical Exam Physical Exam Vitals and nursing note reviewed.  Constitutional:      General: She is in acute distress.     Appearance: She is obese. She is ill-appearing, toxic-appearing and diaphoretic.  HENT:     Head: Normocephalic and atraumatic.     Nose: Nose normal.     Mouth/Throat:     Mouth: Mucous membranes are moist.  Eyes:     Extraocular Movements: Extraocular movements intact.     Pupils: Pupils are equal, round, and reactive to light.  Cardiovascular:     Rate and Rhythm: Tachycardia present.     Pulses: Normal pulses.  Pulmonary:     Effort: Respiratory distress present.     Breath sounds: Stridor present. Wheezing, rhonchi and rales present.  Abdominal:     Palpations: Abdomen is soft.     Tenderness: There is no abdominal tenderness. There is no guarding.  Musculoskeletal:     Cervical back: Neck supple.  Skin:    General: Skin is warm.  Neurological:     General: No focal deficit present.     Mental Status: She is oriented to person, place, and time. Mental status is at baseline.  Psychiatric:  Behavior: Behavior normal.    Data Reviewed: I have personally reviewed following labs and imaging studies  CBC: Recent Labs  Lab 12/30/2021 1004 01/15/22 0412 01/16/22 0421  WBC 27.4* 15.5* 21.0*  NEUTROABS 20.8*  --   --   HGB 11.9* 9.2* 10.0*  HCT 38.2 29.6* 32.0*  MCV 88.8 88.6 88.2  PLT 269 177 979    Basic Metabolic Panel: Recent Labs  Lab 01/23/2022 1004 01/15/22 0412 01/16/22 0421 01/18/22 0457  NA 134* 137 137 139  K 4.2 4.3 4.2 3.7  CL 98 107 108 103  CO2 '24 28 26 30  '$ GLUCOSE 354* 159* 135* 82  BUN 32* 19 20 24*  CREATININE 0.94 0.49 0.53 0.60  CALCIUM 11.1* 10.4* 10.4* 10.5*  MG  --   --   --  1.7    GFR: Estimated Creatinine Clearance: 53.3 mL/min (by C-G formula based on SCr of 0.6 mg/dL). Liver Function Tests: Recent Labs  Lab 12/30/2021 1004 01/15/22 0412  AST  59* 21  ALT 215* 90*  ALKPHOS 103 63  BILITOT 0.5 0.3  PROT 6.9 5.3*  ALBUMIN 3.7 2.8*    Radiology Studies: DG CHEST PORT 1 VIEW  Result Date: 01/18/2022 CLINICAL DATA:  Chronic respiratory failure. EXAM: PORTABLE CHEST 1 VIEW COMPARISON:  01/16/2022 FINDINGS: Lungs are adequately inflated demonstrate persistent bilateral airspace opacification over the mid to lower lungs with possible slight interval worsening over the right midlung. Possible small amount of right pleural fluid. Cardiomediastinal silhouette and remainder of the exam is unchanged. IMPRESSION: Persistent bilateral airspace opacification with possible slight interval worsening over the right midlung. Possible small amount of right pleural fluid. Findings likely due to multifocal infection. Electronically Signed   By: Marin Olp M.D.   On: 01/18/2022 10:15    Scheduled Meds:  mouth rinse  15 mL Mouth Rinse 4 times per day   polyvinyl alcohol  2 drop Both Eyes QID   Continuous Infusions:  sodium chloride Stopped (01/16/22 0950)   morphine       LOS: 5 days   Irwin Brakeman M.D on 01/18/2022 at 3:08 PM  Go to [email or web address] - for contact info  [facility] - Office  [phone]  If 7PM-7AM, please contact night-coverage [email or web address] 01/18/2022, 3:08 PM

## 2022-01-18 NOTE — Progress Notes (Signed)
   01/18/22 0854  Assess: MEWS Score  Temp 97.9 F (36.6 C)  BP 138/63  Pulse Rate (!) 134  Resp (!) 22  SpO2 (!) 80 %  O2 Device Non-rebreather Mask  O2 Flow Rate (L/min) 15 L/min  Assess: MEWS Score  MEWS Temp 0  MEWS Systolic 0  MEWS Pulse 3  MEWS RR 1  MEWS LOC 0  MEWS Score 4  MEWS Score Color Red  Assess: if the MEWS score is Yellow or Red  Were vital signs taken at a resting state? Yes  Focused Assessment Change from prior assessment (see assessment flowsheet)  Does the patient meet 2 or more of the SIRS criteria? No  MEWS guidelines implemented *See Row Information* Yes  Treat  MEWS Interventions Administered scheduled meds/treatments;Administered prn meds/treatments;Escalated (See documentation below)  Pain Scale 0-10  Pain Score 0  Complains of Shortness of breath;Nausea /  Vomiting  Interventions Medication (see MAR);Reposition  Take Vital Signs  Increase Vital Sign Frequency  Red: Q 1hr X 4 then Q 4hr X 4, if remains red, continue Q 4hrs  Escalate  MEWS: Escalate Red: discuss with charge nurse/RN and provider, consider discussing with RRT  Notify: Charge Nurse/RN  Name of Charge Nurse/RN Notified Apolonio Schneiders RN  Date Charge Nurse/RN Notified 01/18/22  Time Charge Nurse/RN Notified 0900  Provider Notification  Provider Name/Title Wynetta Emery  Date Provider Notified 01/18/22  Time Provider Notified 0900  Method of Notification Page;Face-to-face  Notification Reason Change in status;Other (Comment) (RED MEWS 4)  Provider response At bedside;See new orders  Date of Provider Response 01/18/22  Time of Provider Response [phone]  Document  Patient Outcome Transferred/level of care increased  Progress note created (see row info) Yes  Assess: SIRS CRITERIA  SIRS Temperature  0  SIRS Pulse 1  SIRS Respirations  1  SIRS WBC 1  SIRS Score Sum  3    Respiratory called to inform me patients SATS were in the 50's, patient was in the 80's on 15L [place].   SATs would not come  above 85%, paged MD Wynetta Emery. Orders for BIPAP, transferred to step down.

## 2022-01-18 NOTE — Progress Notes (Addendum)
  Interdisciplinary Goals of Care Family Meeting   Date carried out: 01/18/2022  Location of the meeting: Bedside  Member's involved: Physician and Family Member or next of kin, sisters at bedside  Durable Power of Attorney or Loss adjuster, chartered: son     Discussion: We discussed goals of care for [employer] .  Pt continues to have progressive decline despite bipap trial; now becoming hypotensive; CXR is worsening despite treatments.  Pt had already expressed to be DNR but willing to do a bipap trial.  With no meaningful recovery after being on bipap most of the day and ongoing signs of distress family agreeable to transition patient to full comfort care and liberate patient from the bipap and to start morphine for comfort and respiratory distress.    Code status: Full DNR  Disposition: anticipating in hospital death   Time spent for the meeting: [place], MD  01/18/2022, 3:00 PM

## 2022-01-18 NOTE — Care Management Important Message (Signed)
Important Message  Patient Details  Name: Michaela Moon MRN: [id number] Date of Birth: 1935-04-05   Medicare Important Message Given:  Yes     Tommy Medal 01/18/2022, 3:02 PM

## 2022-01-18 NOTE — Progress Notes (Signed)
PT Cancellation Note  Patient Details Name: Michaela Moon MRN: [id number] DOB: 08/21/1935   Cancelled Treatment:    Reason Eval/Treat Not Completed: Medical issues which prohibited therapy.  Patient transferred to a higher level of care and will need new PT consult to resume therapy when patient is medically stable.  Thank you.   10:25 AM, 01/18/22 Lonell Grandchild, MPT Physical Therapist with [facility] 336 [phone] office [phone] mobile phone

## 2022-01-19 DIAGNOSIS — J189 Pneumonia, unspecified organism: Secondary | ICD-10-CM | POA: Diagnosis not present

## 2022-01-19 DIAGNOSIS — R627 Adult failure to thrive: Secondary | ICD-10-CM | POA: Diagnosis present

## 2022-01-19 DIAGNOSIS — I82409 Acute embolism and thrombosis of unspecified deep veins of unspecified lower extremity: Secondary | ICD-10-CM | POA: Diagnosis present

## 2022-01-19 DIAGNOSIS — J9601 Acute respiratory failure with hypoxia: Secondary | ICD-10-CM | POA: Diagnosis not present

## 2022-01-19 DIAGNOSIS — J9621 Acute and chronic respiratory failure with hypoxia: Secondary | ICD-10-CM | POA: Diagnosis not present

## 2022-01-19 DIAGNOSIS — J9602 Acute respiratory failure with hypercapnia: Secondary | ICD-10-CM

## 2022-01-19 DIAGNOSIS — D6869 Other thrombophilia: Secondary | ICD-10-CM

## 2022-01-20 DIAGNOSIS — J449 Chronic obstructive pulmonary disease, unspecified: Secondary | ICD-10-CM | POA: Diagnosis not present

## 2022-01-21 ENCOUNTER — Ambulatory Visit: Payer: Medicare Other | Admitting: Pulmonary Disease

## 2022-01-25 NOTE — Death Summary Note (Signed)
DEATH SUMMARY   Patient Details  Name: Michaela Moon MRN: [id number] DOB: October 06, 1935  Admission/Discharge Information   Admit Date:  01/29/22  Date of Death: Date of Death: 02/04/22  Time of Death: Time of Death: 64  Length of Stay: May 17, 2022  Referring Physician: [name], MD   Reason(s) for Hospitalization  86 y.o. female who is a reformed smoker (quit in 2004 ) with medical history significant of COPD, diabetes, hypertension, lower extremity DVT (on chronic Eliquis), diagnosis of COVID in on 10/31/21 and again on 01/04/22 (after testing negative on 11/28/2021 and 12/07/2021) and recurrent hospitalizations for aspiration pneumonia and acute on chronic respiratory failure---chronically on 5 L nasal cannula supplementation  -Discharged home on 01/12/2022 -Readmitted on 2022/01/29 with acute on chronic hypoxic respiratory failure As per pt's son she did okay in the evening of 01/12/2022 she became acutely short of breath, she was found to be very hypoxic, EMS was called and she was placed on BiPAP   Patient appears to have significant recurrent respiratory problems since she suffered COVID back in September 2023  Diagnoses  Preliminary cause of death:  Secondary Diagnoses (including complications and co-morbidities):  Principal Problem:   Respiratory failure with hypoxia and hypercapnia ([place]) Active Problems:   Essential hypertension   Mixed hyperlipidemia   Diabetes mellitus without complication (HCC)   Peripheral neuropathy   Hypercalcemia   GERD (gastroesophageal reflux disease)   Dysphagia   Multifocal pneumonia   Lung nodule   Pressure injury of skin   Recurrent pneumonia   Hypoalbuminemia due to protein-calorie malnutrition ([place])   Acute on chronic respiratory failure with hypoxia (HCC)   Insulin dependent type 2 diabetes mellitus ([place])   Chronic diastolic HF (heart failure) ([place])   Failure to thrive in adult   Acquired thrombophilia ([place])   DVT (deep venous  thrombosis) ([place])   Brief Hospital Course (including significant findings, care, treatment, and services provided and events leading to death)  1)Acute on chronic respiratory failure with hypoxia -suspect recurrent aspiration episodes and COPD exacerbation with multifocal pneumonia -CT angio chest shows  Extensive bilateral heterogeneous and consolidative airspace Opacities -Weaned off continuous BiPAP on 01/14/2022 -Recent MRSA PCR negative --Continue bronchodilators and mucolytics -c/n  IV Solu-Medrol, continue augmentin and fluconazole. -Patient appears to have significant recurrent respiratory problems since she had COVID back in September 2023 with a progressive decline  01/15/22 -Pt would accept bipap but now DNR/DNI.   -Goal pulse ox 87-92% wean oxygen as able  01/18/22 - Pt had acute decline on medsurg unit/tachycardic, pulse ox desaturated into 60s; placed on bipap and transferred to SDU - after being on bipap most of day no significant improvement and increasing distress noted; I had a goals of care meeting with sisters, son at bedside and decision made to transition to full comfort measures; focusing on comfort, reducing to eliminating respiratory distress and unburdened patient from bipap.  -Pt remains on IV morphine infusion, appears comfortable and in no respiratory distress; family at bedside -Pt expired peacefully on February 05, 2023 at 10 am and pronounced by RN.    2)Dysphagia--chronic  -With a high risk for aspiration/ documented recurrent aspiration events -Continue dysphagia 2 diet with nectar thick liquids. -Education provided for upright position during meals and after meals to continue minimizing chance of aspiration. -[name] reports having hospital bed for her at home. - she remains high risk for recurrent aspiration  - transitioned to full comfort measures on 11/24    3)H/o Bil DVT-lower extremity  venous Dopplers from 12/08/2021 noted -CTA angio chest from  01/03/2022, 12/08/2021 and repeat CTA chest 12/25/2021 without acute PE - transitioned to full comfort measures 11/24   4)Chronic Diastolic HF (Heart Failure) -No vascular congestion appreciated on x-ray or CT scan -Patient chronically on HCTZ from home meds -- continued full comfort measures until pt expired   5)Uncontrolled type 2 diabetes mellitus with steroid induced hyperglycemia --Recent A1c 6.3-reflecting excellent diabetic control PTA -- continued full comfort measures until patient expired   6) social/ethics--- advanced directives discussed with patient and son, patient request DNR/DNI status  - transitioned to full comfort measures on 11/24 and patient expired on 11/25 at 10 am.    7)Pressure injury of skin--POA -No signs of superimposed infection -Provided preventive measures and constant repositioning.   8)GERD (gastroesophageal reflux disease) -- continue full comfort measures until pt expired   9)Mixed hyperlipidemia -- continued full comfort measures until pt expired   10)-HTN-- transitioned to full comfort measures     11) generalized weakness and deconditioning--- full comfort measures unit patient expired    Pertinent Labs and Studies  Significant Diagnostic Studies DG CHEST PORT 1 VIEW  Result Date: 01/18/2022 CLINICAL DATA:  Chronic respiratory failure. EXAM: PORTABLE CHEST 1 VIEW COMPARISON:  01/16/2022 FINDINGS: Lungs are adequately inflated demonstrate persistent bilateral airspace opacification over the mid to lower lungs with possible slight interval worsening over the right midlung. Possible small amount of right pleural fluid. Cardiomediastinal silhouette and remainder of the exam is unchanged. IMPRESSION: Persistent bilateral airspace opacification with possible slight interval worsening over the right midlung. Possible small amount of right pleural fluid. Findings likely due to multifocal infection. Electronically Signed   By: Marin Olp M.D.   On:  01/18/2022 10:15   DG Chest Port 1 View  Result Date: 01/16/2022 CLINICAL DATA:  Acute respiratory failure.  Shortness of breath. EXAM: PORTABLE CHEST 1 VIEW COMPARISON:  Chest radiograph 01/12/2022 FINDINGS: The cardiomediastinal silhouette is within normal limits. Aortic atherosclerosis is noted. Patchy airspace opacities are again seen in the mid and lower lungs bilaterally with mildly asymmetric consolidation laterally in the right lung base which may have mildly worsened. No large pleural effusion or pneumothorax is identified. A remote proximal left humerus fracture is noted. IMPRESSION: Stable to slight worsening of bilateral airspace disease suggestive of pneumonia. Electronically Signed   By: Logan Bores M.D.   On: 01/16/2022 13:13   CT Angio Chest/Abd/Pel for Dissection W and/or W/WO  Result Date: 01/14/2022 CLINICAL DATA:  Diagnosis of COVID 10 days ago presenting with respiratory distress EXAM: CT ANGIOGRAPHY CHEST, ABDOMEN AND PELVIS TECHNIQUE: Non-contrast CT of the chest was initially obtained. Multidetector CT imaging through the chest, abdomen and pelvis was performed using the standard protocol during bolus administration of intravenous contrast. Multiplanar reconstructed images and MIPs were obtained and reviewed to evaluate the vascular anatomy. RADIATION DOSE REDUCTION: This exam was performed according to the departmental dose-optimization program which includes automated exposure control, adjustment of the mA and/or kV according to patient size and/or use of iterative reconstruction technique. CONTRAST:  158m OMNIPAQUE IOHEXOL 350 MG/ML SOLN COMPARISON:  CTA chest dated 01/04/2022 and multiple priors dating back to 03/16/2021 FINDINGS: CTA CHEST FINDINGS Cardiovascular: Preferential opacification of the thoracic aorta. No evidence of thoracic aortic aneurysm or dissection. Normal heart size. No pericardial effusion. Coronary artery calcifications. Aortic atherosclerosis.  Mediastinum/Nodes: thyroid gland without nodules meeting criteria for imaging follow-up by size. Normal esophagus. No pathologically enlarged axillary, supraclavicular, mediastinal, or hilar lymph  nodes. Lungs/Pleura: The central airways are patent. Extensive bilateral heterogeneous and consolidative airspace opacities are again seen, slightly improved compared to 01/04/2022. No pneumothorax. No pleural effusion. Musculoskeletal: No acute or abnormal lytic or blastic osseous lesions. Multilevel degenerative changes of the thoracic spine. Old healed fracture of the left proximal humerus. Review of the MIP images confirms the above findings. CTA ABDOMEN AND PELVIS FINDINGS VASCULAR Aorta: Aortic atherosclerosis. Normal caliber aorta without aneurysm, dissection, vasculitis or significant stenosis. Celiac: Patent without evidence of aneurysm, dissection, vasculitis or significant stenosis. SMA: Patent without evidence of aneurysm, dissection, vasculitis or significant stenosis. Renals: Both renal arteries are patent. Calcified plaque at the right renal artery origin results in mild luminal narrowing. No evidence of aneurysm, dissection, vasculitis, fibromuscular dysplasia or significant stenosis. IMA: Patent without evidence of aneurysm, dissection, vasculitis or significant stenosis. Inflow: Patent without evidence of aneurysm, dissection, vasculitis or significant stenosis. Veins: No obvious venous abnormality within the limitations of this arterial phase study. Review of the MIP images confirms the above findings. NON-VASCULAR Hepatobiliary: Unchanged segment 2 subcentimeter hypodensity (5:90) dating back to 03/16/2021, likely cyst. Unchanged coarse calcification in the right hepatic lobe. No new focal hepatic lesions. No intra or extrahepatic biliary ductal dilation. Normal gallbladder. Pancreas: No focal lesions or main ductal dilation. Spleen: Normal in size without focal abnormality. Adrenals/Urinary Tract: No  adrenal nodules. Bilateral hypoattenuating renal foci, many of which likely represent simple/mildly complicated cysts. A 3.3 cm left interpolar cystic lesion demonstrates peripheral nodular hyperattenuating foci (5:115). No hydronephrosis or calculi. No focal bladder wall thickening. Stomach/Bowel: Normal appearance of the stomach. No evidence of bowel wall thickening, distention, or inflammatory changes. Sigmoid diverticulosis without acute diverticulitis. Normal appendix. Lymphatic: No enlarged abdominal or pelvic lymph nodes. Reproductive: No adnexal masses. Other: No free fluid, fluid collection, or free air. Musculoskeletal: No acute or abnormal lytic or blastic osseous lesions. Multilevel degenerative changes of the lumbar spine. Subcutaneous soft tissue nodules in the anterior abdominal wall, likely related to medication injection. Review of the MIP images confirms the above findings. IMPRESSION: VASCULAR: 1. No evidence of aortic dissection or aneurysm. 2. No central pulmonary emboli. 3. Coronary artery calcifications. Aortic Atherosclerosis (ICD10-I70.0). NONVASCULAR: 1. Extensive bilateral heterogeneous and consolidative airspace opacities, slightly improved compared to 01/04/2022. 2. No acute abnormality in the abdomen or pelvis. 3. A 3.3 cm left interpolar cystic lesion demonstrates peripheral nodular hyperattenuating foci, incompletely characterized. Recommend further evaluation with renal mass protocol MRI or CT if the patient is unable to tolerate MRI. Of note for radiation reduction purposes, the patient has received 5 CTA examinations of the chest within the last month and 7 total CT chest examinations within the last year. 4. Sigmoid diverticulosis without acute diverticulitis. Electronically Signed   By: Darrin Nipper M.D.   On: 01/03/2022 12:12   DG Chest Port 1 View  Result Date: 12/31/2021 CLINICAL DATA:  Shortness of breath EXAM: PORTABLE CHEST 1 VIEW COMPARISON:  Two days ago FINDINGS:  Unchanged bilateral airspace disease which was patchy and likely infectious by prior CT. No visible effusion or pneumothorax. Remote left proximal humerus fracture. IMPRESSION: Stable multifocal airspace disease. Electronically Signed   By: Jorje Guild M.D.   On: 01/12/2022 10:19   DG CHEST PORT 1 VIEW  Result Date: 01/11/2022 CLINICAL DATA:  [id number] Dyspnea and respiratory abnormalities [id number] EXAM: PORTABLE CHEST 1 VIEW COMPARISON:  January 04, 2022 FINDINGS: The cardiomediastinal silhouette is unchanged in contour.Atherosclerotic calcifications of the aorta. No significant pleural effusion. No pneumothorax. Mildly increased  RIGHT-sided airspace opacities in comparison to prior. A similar to mildly improved LEFT-sided airspace opacities. Remote LEFT humeral fracture. IMPRESSION: Mildly increased RIGHT-sided airspace opacities. Similar to mildly improved LEFT-sided airspace opacities. Electronically Signed   By: Valentino Saxon M.D.   On: 01/11/2022 11:04   CT Angio Chest PE W/Cm &/Or Wo Cm  Result Date: 01/04/2022 CLINICAL DATA:  PE suspected shortness of breath EXAM: CT ANGIOGRAPHY CHEST WITH CONTRAST TECHNIQUE: Multidetector CT imaging of the chest was performed using the standard protocol during bolus administration of intravenous contrast. Multiplanar CT image reconstructions and MIPs were obtained to evaluate the vascular anatomy. RADIATION DOSE REDUCTION: This exam was performed according to the departmental dose-optimization program which includes automated exposure control, adjustment of the mA and/or kV according to patient size and/or use of iterative reconstruction technique. CONTRAST:  77m OMNIPAQUE IOHEXOL 350 MG/ML SOLN COMPARISON:  12/25/2021 FINDINGS: Cardiovascular: Satisfactory opacification of the pulmonary arteries to the segmental level. No evidence of pulmonary embolism. Normal heart size. Left and right coronary artery calcifications. No pericardial effusion. Aortic  atherosclerosis. Mediastinum/Nodes: No enlarged mediastinal, hilar, or axillary lymph nodes. Thyroid gland, trachea, and esophagus demonstrate no significant findings. Lungs/Pleura: Extensive bilateral heterogeneous and consolidative airspace opacity is not significantly changed compared to prior examination. No pleural effusion or pneumothorax. Upper Abdomen: No acute abnormality. Musculoskeletal: No chest wall abnormality. No acute osseous findings. Review of the MIP images confirms the above findings. IMPRESSION: 1. Negative examination for pulmonary embolism. 2. Extensive bilateral heterogeneous and consolidative airspace opacity is not significantly changed compared to prior examination. Findings are consistent with multifocal infection and/or aspiration. 3. Coronary artery disease. Aortic Atherosclerosis (ICD10-I70.0). Electronically Signed   By: ADelanna AhmadiM.D.   On: 01/04/2022 11:27   DG Chest Port 1 View  Result Date: 01/04/2022 CLINICAL DATA:  Shortness of breath. EXAM: PORTABLE CHEST 1 VIEW COMPARISON:  December 31, 2021. FINDINGS: The heart size and mediastinal contours are within normal limits. Stable patchy airspace opacities are noted bilaterally concerning for multifocal pneumonia. The visualized skeletal structures are unremarkable. IMPRESSION: Stable bilateral lung opacities as described above. Aortic Atherosclerosis (ICD10-I70.0). Electronically Signed   By: JMarijo ConceptionM.D.   On: 01/04/2022 09:30   DG CHEST PORT 1 VIEW  Result Date: 12/31/2021 CLINICAL DATA:  [id number] dyspnea and respiratory abnormalities. EXAM: PORTABLE CHEST 1 VIEW COMPARISON:  Portable 12/29/2021 FINDINGS: 5:01 a.m. The cardiac size is normal. The mediastinum is normally outlined. The aortic arch is heavily calcified. Patchy airspace disease in the mid to lower lung fields, is unchanged on the left but mildly worsened on the right, with increased small right pleural effusion extending into the horizontal  fissure. The upper lung fields remain clear. There is osteopenia and thoracic spondylosis. IMPRESSION: 1. Patchy airspace disease in the mid to lower lung fields bilaterally, mildly worsened on the right, with increased small right pleural effusion. 2. Aortic atherosclerosis. Electronically Signed   By: KTelford NabM.D.   On: 12/31/2021 07:16   DG CHEST PORT 1 VIEW  Result Date: 12/29/2021 CLINICAL DATA:  Dyspnea and respiratory abnormality EXAM: PORTABLE CHEST 1 VIEW COMPARISON:  12/25/2021 FINDINGS: Normal heart size and mediastinal contours. Persisting bilateral airspace disease. Pneumonia/aspiration findings by recent chest CT. No visible effusion or pneumothorax. Artifact from EKG leads. IMPRESSION: Unchanged bilateral pneumonia/aspiration. Electronically Signed   By: JJorje GuildM.D.   On: 12/29/2021 08:13   CT Angio Chest PE W and/or Wo Contrast  Result Date: 12/25/2021 CLINICAL DATA:  High  clinical suspicion of pulmonary embolism, hypoxemia EXAM: CT ANGIOGRAPHY CHEST WITH CONTRAST TECHNIQUE: Multidetector CT imaging of the chest was performed using the standard protocol during bolus administration of intravenous contrast. Multiplanar CT image reconstructions and MIPs were obtained to evaluate the vascular anatomy. RADIATION DOSE REDUCTION: This exam was performed according to the departmental dose-optimization program which includes automated exposure control, adjustment of the mA and/or kV according to patient size and/or use of iterative reconstruction technique. CONTRAST:  45m OMNIPAQUE IOHEXOL 350 MG/ML SOLN COMPARISON:  12/08/2021 FINDINGS: Cardiovascular: Atherosclerotic calcifications aorta and coronary arteries. Aorta normal caliber without aneurysm or dissection. No pericardial effusion. Pulmonary arteries well opacified and patent. No evidence of pulmonary embolism. Mediastinum/Nodes: Esophagus unremarkable. Base of cervical region normal appearance. No thoracic adenopathy.  Lungs/Pleura: Extensive BILATERAL airspace infiltrates consistent with multifocal pneumonia. No pleural effusion or pneumothorax. No discrete mass. Upper Abdomen: Small hepatic LEFT lobe and RIGHT renal cysts, simple features; no follow-up imaging recommended. Remaining visualized upper abdomen unremarkable. Musculoskeletal: No acute osseous findings. Review of the MIP images confirms the above findings. IMPRESSION: No evidence of pulmonary embolism. Extensive BILATERAL airspace infiltrates consistent with multifocal pneumonia. Scattered atherosclerotic calcifications including coronary arteries. Aortic Atherosclerosis (ICD10-I70.0). Electronically Signed   By: MLavonia DanaM.D.   On: 12/25/2021 11:00   DG Chest Port 1 View  Result Date: 12/25/2021 CLINICAL DATA:  Shortness of breath EXAM: PORTABLE CHEST 1 VIEW COMPARISON:  Previous studies including the examination of 12/13/2021 FINDINGS: Transverse diameter of heart is increased. Central pulmonary vessels are prominent. There are patchy alveolar densities in both parahilar regions and lower lung fields with interval worsening. Lateral costophrenic angles are indistinct. There is no pneumothorax. IMPRESSION: Cardiomegaly. Central pulmonary vessels are prominent. There is interval worsening of alveolar densities in parahilar regions and lower lung fields, especially in right lower lung field. Findings suggest pulmonary edema or multifocal pneumonia with interval worsening. Small bilateral pleural effusions. Electronically Signed   By: PElmer PickerM.D.   On: 12/25/2021 09:15    Microbiology No results found for this or any previous visit (from the past 240 hour(s)).  Lab Basic Metabolic Panel: Recent Labs  Lab 01/05/2022 1004 01/15/22 0412 01/16/22 0421 01/18/22 0457  NA 134* 137 137 139  K 4.2 4.3 4.2 3.7  CL 98 107 108 103  CO2 '24 28 26 30  '$ GLUCOSE 354* 159* 135* 82  BUN 32* 19 20 24*  CREATININE 0.94 0.49 0.53 0.60  CALCIUM 11.1*  10.4* 10.4* 10.5*  MG  --   --   --  1.7   Liver Function Tests: Recent Labs  Lab 01/12/2022 1004 01/15/22 0412  AST 59* 21  ALT 215* 90*  ALKPHOS 103 63  BILITOT 0.5 0.3  PROT 6.9 5.3*  ALBUMIN 3.7 2.8*   No results for input(s): "LIPASE", "AMYLASE" in the last 168 hours. No results for input(s): "AMMONIA" in the last 168 hours. CBC: Recent Labs  Lab 01/03/2022 1004 01/15/22 0412 01/16/22 0421  WBC 27.4* 15.5* 21.0*  NEUTROABS 20.8*  --   --   HGB 11.9* 9.2* 10.0*  HCT 38.2 29.6* 32.0*  MCV 88.8 88.6 88.2  PLT 269 177 178   Cardiac Enzymes: No results for input(s): "CKTOTAL", "CKMB", "CKMBINDEX", "TROPONINI" in the last 168 hours. Sepsis Labs: Recent Labs  Lab 01/18/2022 1004 01/15/22 0412 01/16/22 0421  WBC 27.4* 15.5* 21.0*    Procedures/Operations   Donyea Beverlin 1Dec 13, 2023 10:22 AM

## 2022-01-25 NOTE — Progress Notes (Signed)
PROGRESS NOTE   Michaela Moon, is a 86 y.o. female, DOB - May 05, 1935, KDT:[id number]  Admit date - 12/31/2021   Admitting Physician Courage Denton Brick, MD  Outpatient Primary MD for the patient is [name], MD  LOS - 6  Chief Complaint  Patient presents with   Respiratory Distress      Brief Narrative:  86 y.o. female who is a reformed smoker (quit in 2004 )with medical history significant of COPD, diabetes, hypertension, lower extremity DVT (on chronic Eliquis), diagnosis of COVID in on 10/31/21 and again on 01/04/22 (after testing negative on 11/28/2021 and 12/07/2021) and recurrent hospitalization for aspiration pneumonia and acute on chronic respiratory failure---chronically on 5 L nasal cannula supplementation  -Discharged home on 01/12/2022 -Readmitted on 12/27/2021 with acute on chronic hypoxic respiratory failure As per pt's son she did okay in the evening of 01/12/2022-----this morning she became acutely short of breath, she was found to be very hypoxic, EMS was called and she was placed on BiPAP  Patient appears to have significant recurrent respiratory problems since she suffered COVID back in September 2023    -Assessment and Plan: 1)Acute on chronic respiratory failure with hypoxia -suspect recurrent aspiration episodes and COPD exacerbation -CT angio chest shows  Extensive bilateral heterogeneous and consolidative airspace Opacities -Weaned off continuous BiPAP on 01/14/2022, , may continue as needed BiPAP and BiPAP nightly -Recent MRSA PCR negative --Continue bronchodilators and mucolytics -c/n  IV Solu-Medrol, continue augmentin and fluconazole. -Patient appears to have significant recurrent respiratory problems since she had COVID back in September 2023 with a progressive decline  01/15/22 -Pt would accept bipap but is now DNR/DNI.   -Goal pulse ox 87-92% wean oxygen as able  01/18/22 - Pt had acute decline on medsurg unit/tachycardic, pulse ox desaturated into  60s; placed on bipap and transferred to SDU - after being on bipap most of day no significant improvement and increasing distress noted; I had a goals of care meeting with sisters, son at bedside and decision made to transition to full comfort measures; focusing on comfort, reducing to eliminating respiratory distress and unburdened patient from bipap.  -Pt remains on IV morphine infusion, appears comfortable and in no respiratory distress; family at bedside   2)Dysphagia--chronic  -With a high risk for aspiration/ documented recurrent aspiration events -Continue dysphagia 2 diet with nectar thick liquids. -Education provided for upright position during meals and after meals to continue minimizing chance of aspiration. -[name] reports having hospital bed for her at home. - she remains high risk for recurrent aspiration  - transitioned to full comfort measures on 11/24    3)H/o Bil DVT-lower extremity venous Dopplers from 12/08/2021 noted -CTA angio chest from 01/14/2022, 12/08/2021 and repeat CTA chest 12/25/2021 without acute PE - transitioned to full comfort measures 11/24   4)Chronic Diastolic HF (Heart Failure) -No vascular congestion appreciated on x-ray or CT scan -Patient chronically on HCTZ from home meds -- continue full comfort measures    5)Uncontrolled type 2 diabetes mellitus with steroid induced hyperglycemia --Recent A1c 6.3-reflecting excellent diabetic control PTA -- continue full comfort measures  CBG (last 3)  Recent Labs    01/18/22 0307 01/18/22 0726 01/18/22 1126  GLUCAP 103* 141* 137*    6) social/ethics--- advanced directives discussed with patient and son, patient request DNR/DNI status  - transitioning to full comfort measures 11/24   7)Pressure injury of skin--POA -No signs of superimposed infection -Provide preventive measures and constant repositioning.   8)GERD (gastroesophageal reflux disease) --  continue full comfort measures   9)Mixed  hyperlipidemia -- continue full comfort measures   10)-HTN-- transitioning to full comfort measures    11) generalized weakness and deconditioning--- continue full comfort measures   Disposition: Anticipate hospital death    Status is: Inpatient    Disposition: anticipating hospital death  Barriers: Not Clinically Stable-   Code Status :  -  Code Status: DNR   Family Communication:   Discussed with Son, sisters at bedside  DVT Prophylaxis  :   - SCDs     Lab Results  Component Value Date   PLT 178 01/16/2022   Inpatient Medications  Scheduled Meds:  mouth rinse  15 mL Mouth Rinse 4 times per day   polyvinyl alcohol  2 drop Both Eyes QID   Continuous Infusions:  sodium chloride Stopped (01/16/22 0950)   morphine 2 mg/hr (01/18/22 1510)   PRN Meds:.acetaminophen **OR** acetaminophen, antiseptic oral rinse, diphenhydrAMINE, glycopyrrolate **OR** glycopyrrolate **OR** glycopyrrolate, LORazepam **OR** LORazepam **OR** LORazepam, morphine, ondansetron **OR** ondansetron (ZOFRAN) IV, ondansetron   Anti-infectives (From admission, onward)    Start     Dose/Rate Route Frequency Ordered Stop   01/09/2022 1315  amoxicillin-clavulanate (AUGMENTIN) 875-125 MG per tablet 1 tablet  Status:  Discontinued        1 tablet Oral Every 12 hours 01/23/2022 1302 01/18/22 1446   01/08/2022 1315  fluconazole (DIFLUCAN) tablet 200 mg        200 mg Oral Daily 01/24/2022 1312 01/18/22 0959       Subjective: [name] developed acute respiratory failure on med surg - - transferred to stepdown unit and now transitioned to full comfort measures   Objective: Vitals:   01/18/22 1300 01/18/22 1318 01/18/22 1400 01/18/22 2000  BP: (!) 86/47  (!) 82/51   Pulse: 88 88 86   Resp: '18 16 11   '$ Temp:    99 F (37.2 C)  TempSrc:    Axillary  SpO2: (!) 88% 90% 90% 92%  Weight:      Height:        Intake/Output Summary (Last 24 hours) at 02-17-22 1003 Last data filed at 01/18/2022 1837 Gross  per 24 hour  Intake 6.89 ml  Output --  Net 6.89 ml     Filed Weights   01/15/2022 0949 01/12/2022 1339  Weight: 74.5 kg 75.7 kg   Physical Exam Physical Exam Vitals and nursing note reviewed.  Constitutional:      General: She is not in acute distress.    Appearance: She is ill-appearing. She is not toxic-appearing or diaphoretic.  HENT:     Head: Normocephalic and atraumatic.     Nose: Nose normal.     Mouth/Throat:     Mouth: Mucous membranes are dry.  Cardiovascular:     Pulses: Normal pulses.  Pulmonary:     Effort: Respiratory distress present.     Breath sounds: Stridor present. Wheezing, rhonchi and rales present.  Abdominal:     Palpations: Abdomen is soft.     Tenderness: There is no abdominal tenderness. There is no guarding.  Skin:    General: Skin is warm.    Data Reviewed: I have personally reviewed following labs and imaging studies  CBC: Recent Labs  Lab 01/16/2022 1004 01/15/22 0412 01/16/22 0421  WBC 27.4* 15.5* 21.0*  NEUTROABS 20.8*  --   --   HGB 11.9* 9.2* 10.0*  HCT 38.2 29.6* 32.0*  MCV 88.8 88.6 88.2  PLT 269 177 178  Basic Metabolic Panel: Recent Labs  Lab 01/13/2022 1004 01/15/22 0412 01/16/22 0421 01/18/22 0457  NA 134* 137 137 139  K 4.2 4.3 4.2 3.7  CL 98 107 108 103  CO2 '24 28 26 30  '$ GLUCOSE 354* 159* 135* 82  BUN 32* 19 20 24*  CREATININE 0.94 0.49 0.53 0.60  CALCIUM 11.1* 10.4* 10.4* 10.5*  MG  --   --   --  1.7    GFR: Estimated Creatinine Clearance: 53.3 mL/min (by C-G formula based on SCr of 0.6 mg/dL). Liver Function Tests: Recent Labs  Lab 01/15/2022 1004 01/15/22 0412  AST 59* 21  ALT 215* 90*  ALKPHOS 103 63  BILITOT 0.5 0.3  PROT 6.9 5.3*  ALBUMIN 3.7 2.8*    Radiology Studies: DG CHEST PORT 1 VIEW  Result Date: 01/18/2022 CLINICAL DATA:  Chronic respiratory failure. EXAM: PORTABLE CHEST 1 VIEW COMPARISON:  01/16/2022 FINDINGS: Lungs are adequately inflated demonstrate persistent bilateral airspace  opacification over the mid to lower lungs with possible slight interval worsening over the right midlung. Possible small amount of right pleural fluid. Cardiomediastinal silhouette and remainder of the exam is unchanged. IMPRESSION: Persistent bilateral airspace opacification with possible slight interval worsening over the right midlung. Possible small amount of right pleural fluid. Findings likely due to multifocal infection. Electronically Signed   By: Marin Olp M.D.   On: 01/18/2022 10:15    Scheduled Meds:  mouth rinse  15 mL Mouth Rinse 4 times per day   polyvinyl alcohol  2 drop Both Eyes QID   Continuous Infusions:  sodium chloride Stopped (01/16/22 0950)   morphine 2 mg/hr (01/18/22 1510)     LOS: 6 days   Irwin Brakeman M.D on 2022/02/11 at 10:03 AM  Go to [email or web address] - for contact info  [facility] - Office  [phone]  If 7PM-7AM, please contact night-coverage [email or web address] 02-11-2022, 10:03 AM

## 2022-01-25 DEATH — deceased

## 2023-08-05 IMAGING — DX DG CHEST 2V
2 series · 2 of 2 positions shown · non-contrast
Comparison: Chest radiograph 10/25/2020.

CLINICAL DATA: History of pneumonia.

EXAM:
CHEST - 2 VIEW

[chest pa]
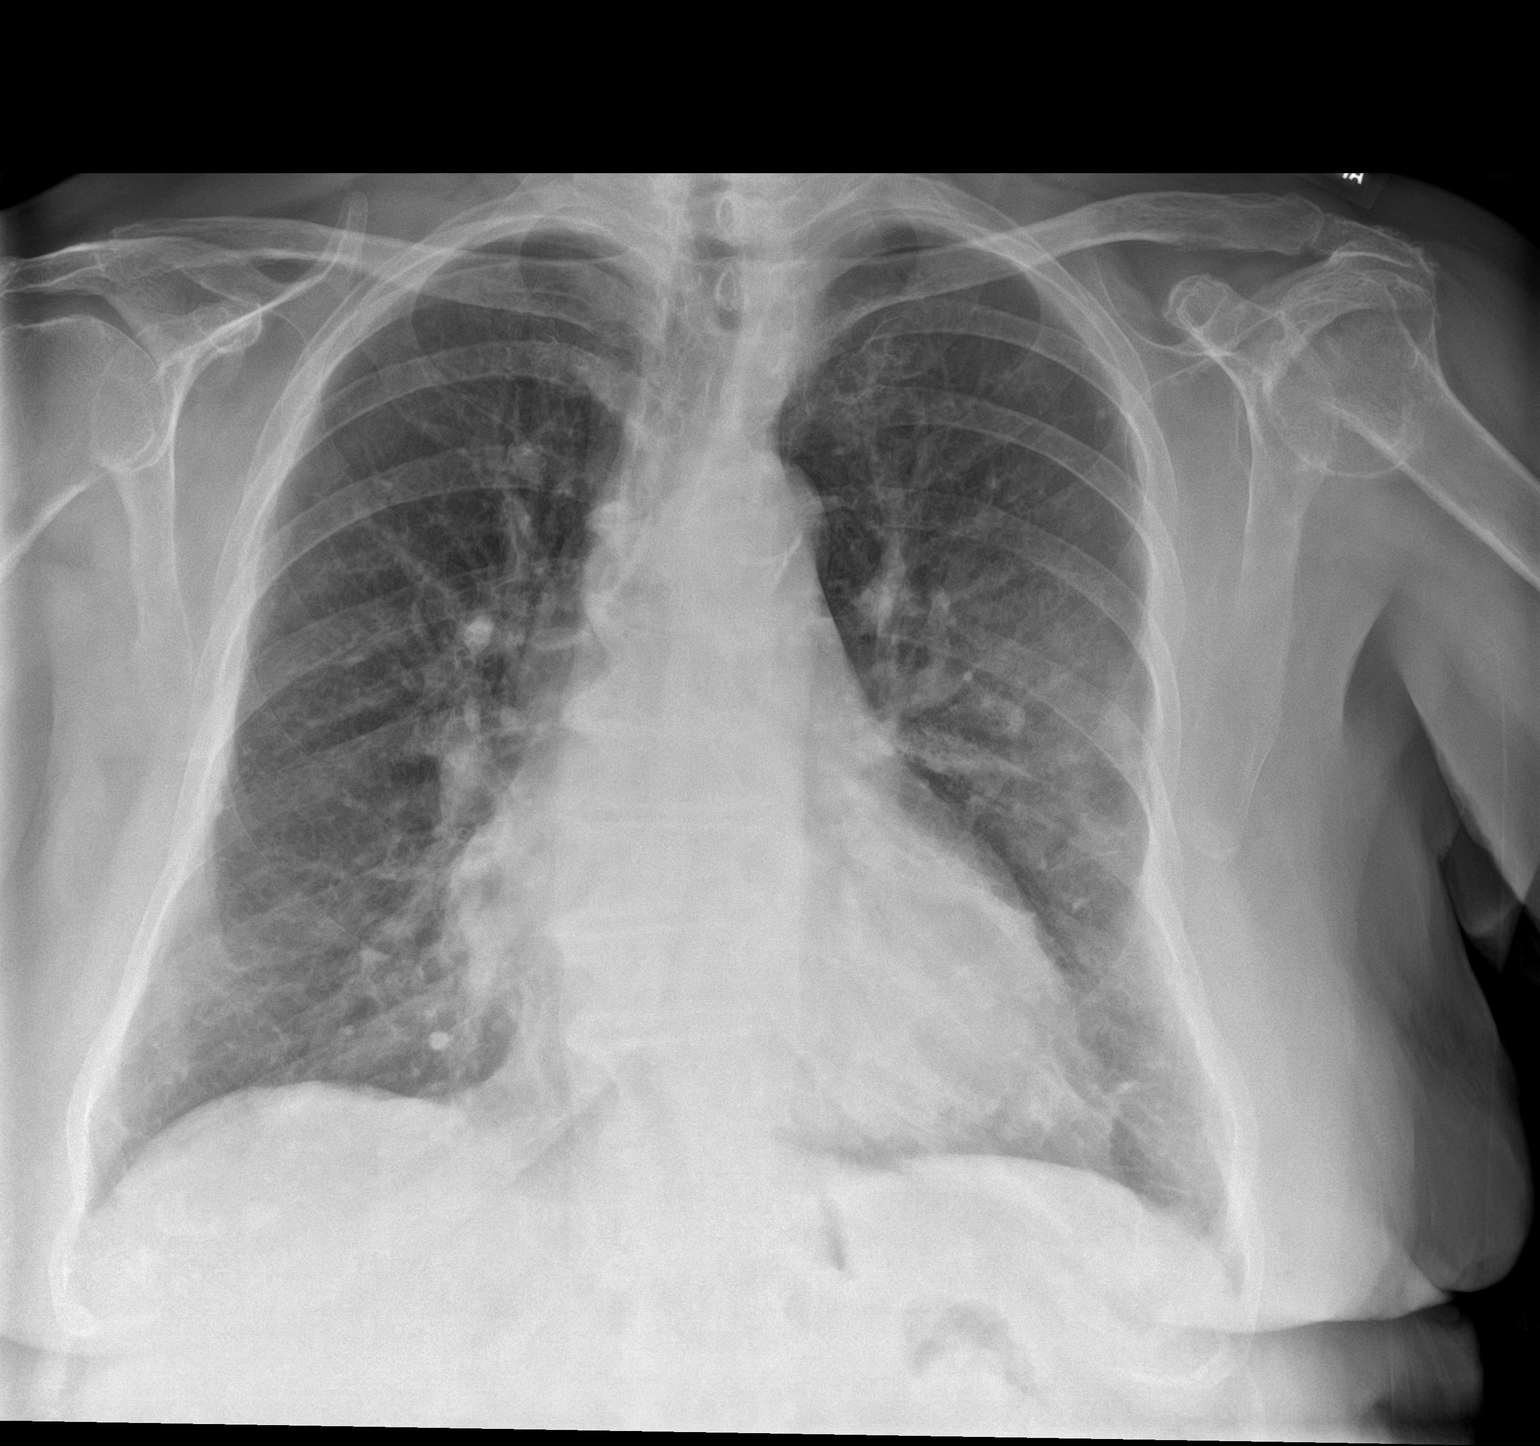

[chest lat]
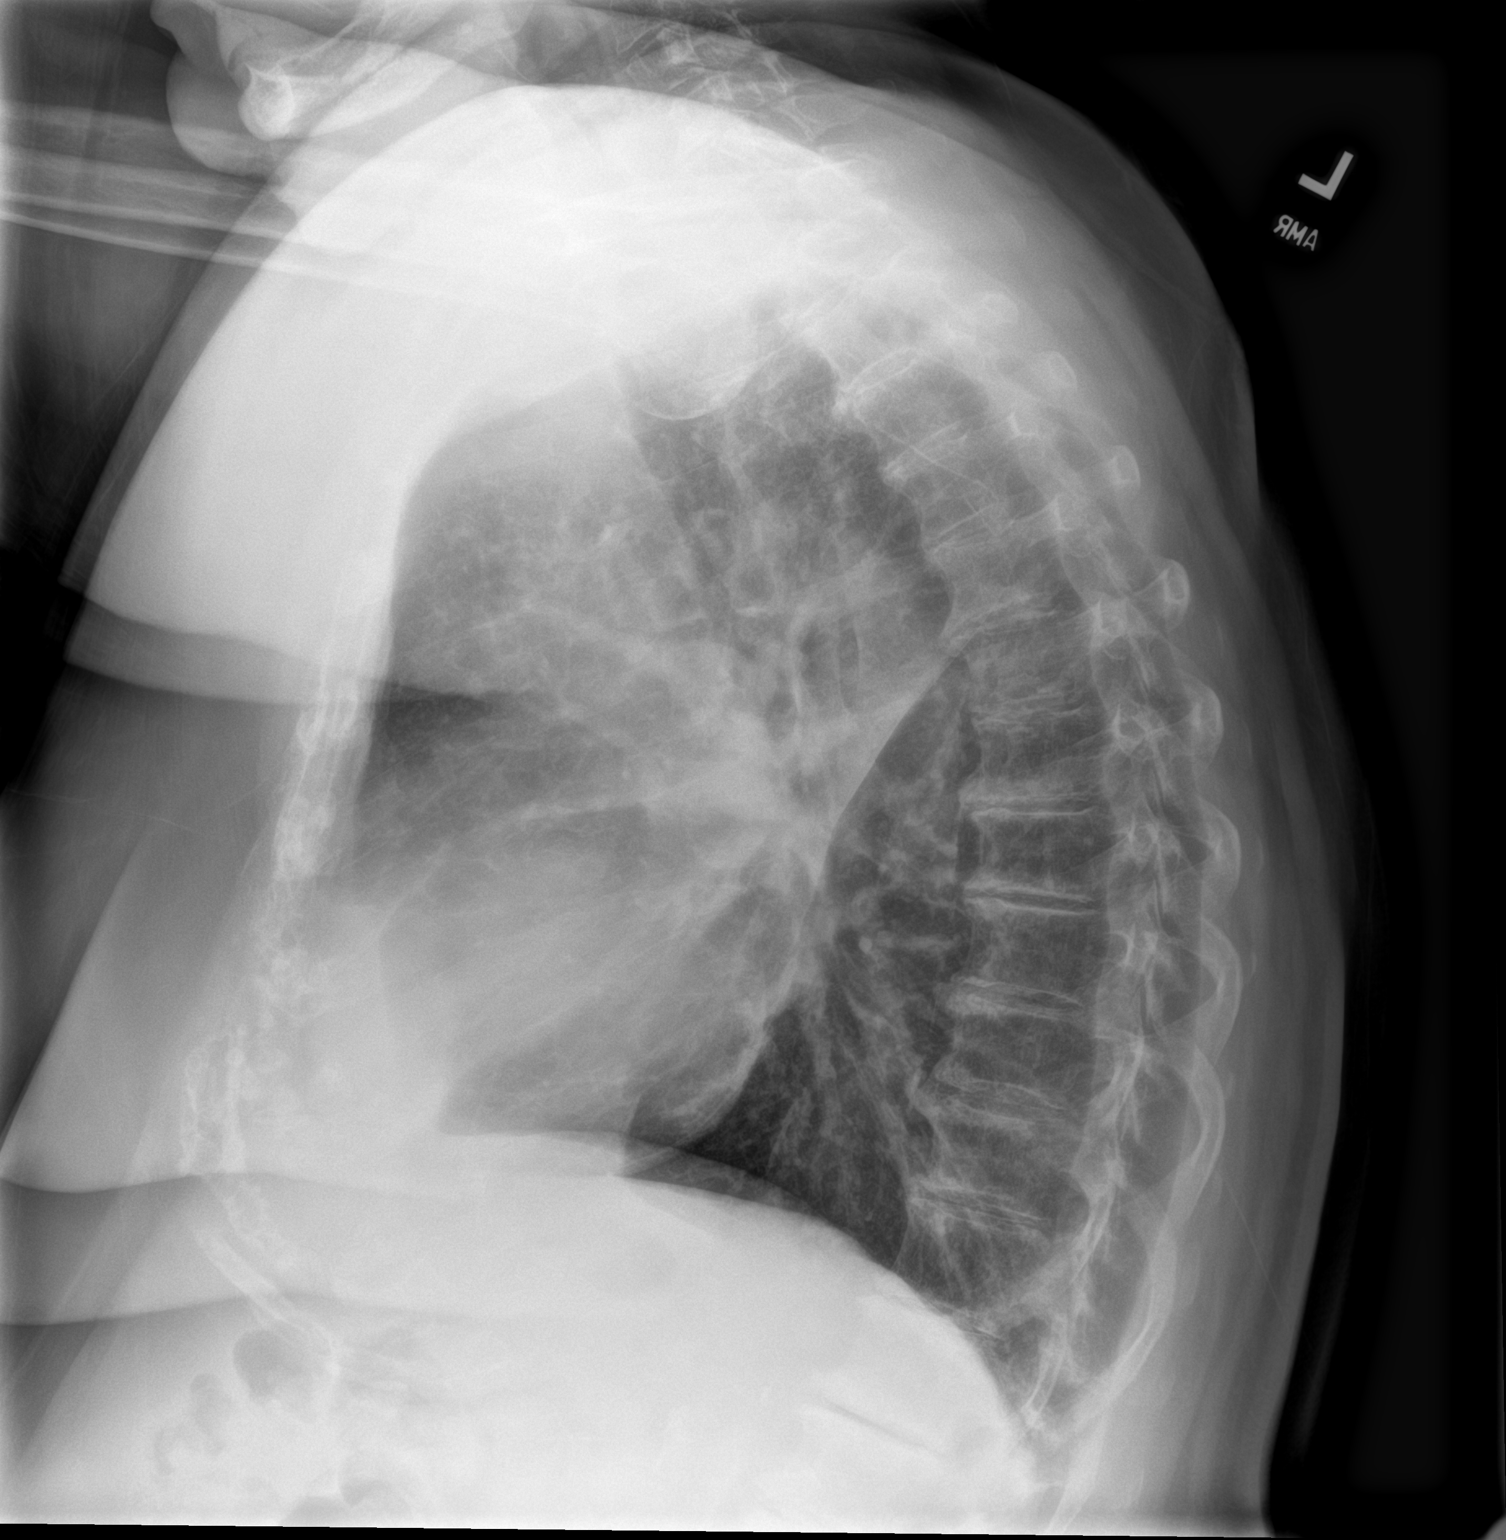

[2 of 2 positions shown; findings below may reference images not displayed]

FINDINGS: Stable enlarged cardiac and mediastinal contours. Slight interval
increase in patchy consolidation within the left mid lung. No
pleural effusion or pneumothorax. Thoracic spine degenerative
changes.
IMPRESSION: Interval increase in patchy left mid lung opacities which may
represent worsening infection. Recommend short-term follow-up chest
radiograph or CT to reassess.
